# Patient Record
Sex: Male | Born: 1980 | Race: White | Hispanic: No | Marital: Single | Smoking: Former smoker
Health system: Southern US, Community
[De-identification: ages and names within clinical notes are randomized; demographics above are authoritative.]

## PROBLEM LIST (undated history)

## (undated) DIAGNOSIS — K922 Gastrointestinal hemorrhage, unspecified: Secondary | ICD-10-CM

## (undated) DIAGNOSIS — F419 Anxiety disorder, unspecified: Secondary | ICD-10-CM

## (undated) DIAGNOSIS — R519 Headache, unspecified: Secondary | ICD-10-CM

## (undated) DIAGNOSIS — F329 Major depressive disorder, single episode, unspecified: Secondary | ICD-10-CM

## (undated) DIAGNOSIS — K279 Peptic ulcer, site unspecified, unspecified as acute or chronic, without hemorrhage or perforation: Secondary | ICD-10-CM

## (undated) DIAGNOSIS — K219 Gastro-esophageal reflux disease without esophagitis: Secondary | ICD-10-CM

## (undated) DIAGNOSIS — Z87442 Personal history of urinary calculi: Secondary | ICD-10-CM

## (undated) DIAGNOSIS — R569 Unspecified convulsions: Secondary | ICD-10-CM

## (undated) DIAGNOSIS — T4145XA Adverse effect of unspecified anesthetic, initial encounter: Secondary | ICD-10-CM

## (undated) DIAGNOSIS — G8929 Other chronic pain: Secondary | ICD-10-CM

## (undated) DIAGNOSIS — M792 Neuralgia and neuritis, unspecified: Secondary | ICD-10-CM

## (undated) DIAGNOSIS — R51 Headache: Secondary | ICD-10-CM

## (undated) DIAGNOSIS — R1013 Epigastric pain: Secondary | ICD-10-CM

## (undated) DIAGNOSIS — I10 Essential (primary) hypertension: Secondary | ICD-10-CM

## (undated) DIAGNOSIS — F32A Depression, unspecified: Secondary | ICD-10-CM

## (undated) DIAGNOSIS — T8859XA Other complications of anesthesia, initial encounter: Secondary | ICD-10-CM

## (undated) DIAGNOSIS — M549 Dorsalgia, unspecified: Secondary | ICD-10-CM

## (undated) DIAGNOSIS — F4310 Post-traumatic stress disorder, unspecified: Secondary | ICD-10-CM

## (undated) DIAGNOSIS — M1990 Unspecified osteoarthritis, unspecified site: Secondary | ICD-10-CM

## (undated) HISTORY — PX: SHOULDER SURGERY: SHX246

## (undated) HISTORY — DX: Unspecified convulsions: R56.9

## (undated) HISTORY — DX: Anxiety disorder, unspecified: F41.9

## (undated) HISTORY — PX: NOSE SURGERY: SHX723

## (undated) HISTORY — PX: RHINOPLASTY: SUR1284

---

## 2001-12-15 HISTORY — PX: RHINOPLASTY: SUR1284

## 2007-09-13 ENCOUNTER — Emergency Department (HOSPITAL_COMMUNITY): Admission: EM | Admit: 2007-09-13 | Discharge: 2007-09-13 | Payer: Self-pay | Admitting: Emergency Medicine

## 2008-01-04 ENCOUNTER — Emergency Department (HOSPITAL_COMMUNITY): Admission: EM | Admit: 2008-01-04 | Discharge: 2008-01-04 | Payer: Self-pay | Admitting: Emergency Medicine

## 2008-02-24 ENCOUNTER — Emergency Department (HOSPITAL_COMMUNITY): Admission: EM | Admit: 2008-02-24 | Discharge: 2008-02-24 | Payer: Self-pay | Admitting: Emergency Medicine

## 2008-02-28 ENCOUNTER — Emergency Department (HOSPITAL_COMMUNITY): Admission: EM | Admit: 2008-02-28 | Discharge: 2008-02-28 | Payer: Self-pay | Admitting: Emergency Medicine

## 2008-06-14 ENCOUNTER — Emergency Department (HOSPITAL_COMMUNITY): Admission: EM | Admit: 2008-06-14 | Discharge: 2008-06-14 | Payer: Self-pay | Admitting: Emergency Medicine

## 2008-07-12 ENCOUNTER — Emergency Department (HOSPITAL_COMMUNITY): Admission: EM | Admit: 2008-07-12 | Discharge: 2008-07-12 | Payer: Self-pay | Admitting: Emergency Medicine

## 2008-09-02 ENCOUNTER — Emergency Department (HOSPITAL_COMMUNITY): Admission: EM | Admit: 2008-09-02 | Discharge: 2008-09-02 | Payer: Self-pay | Admitting: Emergency Medicine

## 2008-10-10 ENCOUNTER — Emergency Department (HOSPITAL_COMMUNITY): Admission: EM | Admit: 2008-10-10 | Discharge: 2008-10-10 | Payer: Self-pay | Admitting: Emergency Medicine

## 2008-12-13 ENCOUNTER — Emergency Department (HOSPITAL_COMMUNITY): Admission: EM | Admit: 2008-12-13 | Discharge: 2008-12-13 | Payer: Self-pay | Admitting: Emergency Medicine

## 2009-03-14 ENCOUNTER — Emergency Department (HOSPITAL_COMMUNITY): Admission: EM | Admit: 2009-03-14 | Discharge: 2009-03-14 | Payer: Self-pay | Admitting: Emergency Medicine

## 2009-03-23 ENCOUNTER — Emergency Department (HOSPITAL_COMMUNITY): Admission: EM | Admit: 2009-03-23 | Discharge: 2009-03-23 | Payer: Self-pay | Admitting: Emergency Medicine

## 2009-05-06 ENCOUNTER — Emergency Department (HOSPITAL_COMMUNITY): Admission: EM | Admit: 2009-05-06 | Discharge: 2009-05-06 | Payer: Self-pay | Admitting: Emergency Medicine

## 2009-06-10 ENCOUNTER — Emergency Department (HOSPITAL_COMMUNITY): Admission: EM | Admit: 2009-06-10 | Discharge: 2009-06-10 | Payer: Self-pay | Admitting: Emergency Medicine

## 2009-07-02 ENCOUNTER — Emergency Department (HOSPITAL_COMMUNITY): Admission: EM | Admit: 2009-07-02 | Discharge: 2009-07-02 | Payer: Self-pay | Admitting: Emergency Medicine

## 2009-07-11 ENCOUNTER — Emergency Department (HOSPITAL_COMMUNITY): Admission: EM | Admit: 2009-07-11 | Discharge: 2009-07-11 | Payer: Self-pay | Admitting: Emergency Medicine

## 2009-09-03 ENCOUNTER — Emergency Department (HOSPITAL_COMMUNITY): Admission: EM | Admit: 2009-09-03 | Discharge: 2009-09-03 | Payer: Self-pay | Admitting: Emergency Medicine

## 2009-11-14 ENCOUNTER — Emergency Department (HOSPITAL_COMMUNITY): Admission: EM | Admit: 2009-11-14 | Discharge: 2009-11-14 | Payer: Self-pay | Admitting: Emergency Medicine

## 2010-07-03 ENCOUNTER — Emergency Department (HOSPITAL_COMMUNITY): Admission: EM | Admit: 2010-07-03 | Discharge: 2010-07-03 | Payer: Self-pay | Admitting: Emergency Medicine

## 2010-12-16 ENCOUNTER — Emergency Department (HOSPITAL_COMMUNITY)
Admission: EM | Admit: 2010-12-16 | Discharge: 2010-12-16 | Payer: Self-pay | Source: Home / Self Care | Admitting: Emergency Medicine

## 2011-02-03 ENCOUNTER — Emergency Department (HOSPITAL_COMMUNITY)
Admission: EM | Admit: 2011-02-03 | Discharge: 2011-02-03 | Disposition: A | Discharge status: Home / Self Care | Payer: Self-pay | Attending: Emergency Medicine | Admitting: Emergency Medicine

## 2011-02-03 DIAGNOSIS — M25519 Pain in unspecified shoulder: Secondary | ICD-10-CM | POA: Insufficient documentation

## 2011-02-03 DIAGNOSIS — S42309A Unspecified fracture of shaft of humerus, unspecified arm, initial encounter for closed fracture: Secondary | ICD-10-CM | POA: Insufficient documentation

## 2011-02-03 DIAGNOSIS — X58XXXA Exposure to other specified factors, initial encounter: Secondary | ICD-10-CM | POA: Insufficient documentation

## 2011-02-24 LAB — DIFFERENTIAL
Eosinophils Absolute: 0 10*3/uL (ref 0.0–0.7)
Eosinophils Relative: 0 % (ref 0–5)
Lymphs Abs: 1.6 10*3/uL (ref 0.7–4.0)
Monocytes Relative: 5 % (ref 3–12)
Neutro Abs: 9.3 10*3/uL — ABNORMAL HIGH (ref 1.7–7.7)
Neutrophils Relative %: 81 % — ABNORMAL HIGH (ref 43–77)

## 2011-02-24 LAB — CBC
HCT: 45 % (ref 39.0–52.0)
MCHC: 36.2 g/dL — ABNORMAL HIGH (ref 30.0–36.0)
MCV: 93.4 fL (ref 78.0–100.0)
RBC: 4.82 MIL/uL (ref 4.22–5.81)
WBC: 11.5 10*3/uL — ABNORMAL HIGH (ref 4.0–10.5)

## 2011-02-24 LAB — BASIC METABOLIC PANEL
BUN: 6 mg/dL (ref 6–23)
CO2: 27 mEq/L (ref 19–32)
Calcium: 9.8 mg/dL (ref 8.4–10.5)
Chloride: 100 mEq/L (ref 96–112)
Glucose, Bld: 95 mg/dL (ref 70–99)
Potassium: 3.9 mEq/L (ref 3.5–5.1)

## 2011-02-25 ENCOUNTER — Emergency Department (HOSPITAL_COMMUNITY): Payer: Self-pay

## 2011-02-25 ENCOUNTER — Encounter: Payer: Self-pay | Admitting: Orthopedic Surgery

## 2011-02-25 ENCOUNTER — Emergency Department (HOSPITAL_COMMUNITY)
Admission: EM | Admit: 2011-02-25 | Discharge: 2011-02-25 | Disposition: A | Discharge status: Home / Self Care | Payer: Self-pay | Attending: Emergency Medicine | Admitting: Emergency Medicine

## 2011-02-25 DIAGNOSIS — W1809XA Striking against other object with subsequent fall, initial encounter: Secondary | ICD-10-CM | POA: Insufficient documentation

## 2011-02-25 DIAGNOSIS — R569 Unspecified convulsions: Secondary | ICD-10-CM | POA: Insufficient documentation

## 2011-02-25 DIAGNOSIS — S42293A Other displaced fracture of upper end of unspecified humerus, initial encounter for closed fracture: Secondary | ICD-10-CM | POA: Insufficient documentation

## 2011-02-25 LAB — BASIC METABOLIC PANEL
BUN: 9 mg/dL (ref 6–23)
CO2: 22 mEq/L (ref 19–32)
Chloride: 100 mEq/L (ref 96–112)
Glucose, Bld: 85 mg/dL (ref 70–99)
Potassium: 4 mEq/L (ref 3.5–5.1)
Sodium: 134 mEq/L — ABNORMAL LOW (ref 135–145)

## 2011-02-27 ENCOUNTER — Ambulatory Visit (INDEPENDENT_AMBULATORY_CARE_PROVIDER_SITE_OTHER): Payer: Self-pay | Admitting: Orthopedic Surgery

## 2011-02-27 ENCOUNTER — Encounter: Payer: Self-pay | Admitting: Orthopedic Surgery

## 2011-02-27 DIAGNOSIS — S42209A Unspecified fracture of upper end of unspecified humerus, initial encounter for closed fracture: Secondary | ICD-10-CM

## 2011-03-04 NOTE — Assessment & Plan Note (Signed)
Summary: AP ER FOL/UP/FX LT SHOULDER/XRAY AP 02/25/11/SELF-PAY/AWARE OF...   Visit Type:  new patient Referring Provider:  ap er Primary Provider:  na  CC:  left shoulder pain.  History of Present Illness: I saw Alan Jennings in the office today for an initial visit.  He is a 30 years old man with the complaint of:  left shoulder pain.  Fell 02/25/11 while having seizure.  Xrays of the left shoulder and right knee on 02/25/11.  Meds: Dilantin, Diazepam, was given Percocet 5/325mg  from er, number 70.  30 year old male with seizure disorder ran out of his medication had another seizure. He has a previous LEFT humerus fracture treated in the evening, [place] with a sling and swath. When he fell this time. He injured his proximal humerus. X-rays show a proximal humerus fracture with no displacement fragment. Greater than a centimeter and angulated greater than 45. He has sharp, dull, throbbing, stabbing, burning pain.  Intensity is 8/10. Constant pain. He does have some tingling. He has some swelling.     Allergies (verified): 1)  ! * Tramadol  Past History:  Past Medical History: anxiety seizures  Past Surgical History: na  Family History: Family History Coronary Heart Disease male < 49 Family History of Arthritis  Social History: Patient is single.  unemployed smokes less than half pack of cigs per day no smoking no caffeine 2 yrs college [employer]  Review of Systems Constitutional:  Denies weight loss, weight gain, fever, chills, and fatigue. Cardiovascular:  Denies chest pain, palpitations, fainting, and murmurs. Respiratory:  Denies short of breath, wheezing, couch, tightness, pain on inspiration, and snoring . Gastrointestinal:  Denies heartburn, nausea, vomiting, diarrhea, constipation, and blood in your stools. Genitourinary:  Denies frequency, urgency, difficulty urinating, painful urination, flank pain, and bleeding in urine. Neurologic:  Complains of  seizure; denies numbness, tingling, unsteady gait, dizziness, and tremors. Musculoskeletal:  Complains of joint pain, swelling, stiffness, heat, and muscle pain; denies instability and redness. Endocrine:  Denies excessive thirst, exessive urination, and heat or cold intolerance. Psychiatric:  Complains of nervousness and anxiety; denies depression and hallucinations. Skin:  Denies changes in the skin, poor healing, rash, itching, and redness. HEENT:  Denies blurred or double vision, eye pain, redness, and watering. Immunology:  Denies seasonal allergies, sinus problems, and allergic to bee stings. Hemoatologic:  Denies easy bleeding and brusing.  Physical Exam  Msk:  The patient is well developed and nourished, with normal grooming and hygiene. The body habitus is  normal Pulses:  radial ulnar normal 2 +        Extremities:  in  He has a tenderness in the proximal humerus. He has an old, is around his chest, and proximal humerus, which he says is from the previous fracture has some tenderness over his acromioclavicular joint.  He has minimal to no movement of the shoulder joint itself.  Muscle tone is normal. There is some swelling in the arm. The elbow seems to be normal, although we have limited exam secondary to pain. Neurologic:  NORMAL SENSATION IN THE LEFT UPPER EXTREMITY   HE DOSES HAVE WEAKNESS OF HIS WR EXT  Skin:  intact without lesions or rashes Cervical Nodes:  no significant adenopathy Psych:  alert and cooperative; normal mood and affect; normal attention span and concentration   Impression & Recommendations: the hospital, found shows a proximal humerus fracture with no fragment displaced greater than 45 or 1 cm.  This meets the criteria for nonoperative treatment.  Continue  sling come back in 2 weeks for x-rays.  Take ibuprofen and Norco 7.5 mg for pain  Medications Added to Medication List This Visit: 1)  Norco 7.5-325 Mg Tabs  (Hydrocodone-acetaminophen) .Marland Kitchen.. 1 q 4 as needed pain 2)  Ibuprofen 800 Mg Tabs (Ibuprofen) .Marland Kitchen.. 1 by mouth three times a day  Other Orders: New Patient Level III (60454) Humeral Neck Fx (09811)  Patient Instructions: 1)  Wear sling x 2 weeks then come back for shoulder x-rays  Prescriptions: IBUPROFEN 800 MG TABS (IBUPROFEN) 1 by mouth three times a day  #90 x 1   Entered and Authorized by:   Fuller Canada MD   Signed by:   Fuller Canada MD on 02/27/2011   Method used:   Print then Give to Patient   RxID:   [id number] NORCO 7.5-325 MG TABS (HYDROCODONE-ACETAMINOPHEN) 1 q 4 as needed pain  #84 x 0   Entered and Authorized by:   Fuller Canada MD   Signed by:   Fuller Canada MD on 02/27/2011   Method used:   Print then Give to Patient   RxID:   [id number]    Orders Added: 1)  New Patient Level III [40102] 2)  Humeral Neck Fx [23600]  Appended Document: AP ER FOL/UP/FX LT SHOULDER/XRAY AP 02/25/11/SELF-PAY/AWARE OF... The x-rays were done at [facility]. The report and the films have been reviewed.

## 2011-03-13 NOTE — Letter (Signed)
Summary: History form  History form   Imported By: Cammie Sickle 03/03/2011 21:06:06  _____________________________________________________________________  External Attachment:    Type:   Image     Comment:   External Document

## 2011-03-18 ENCOUNTER — Ambulatory Visit (INDEPENDENT_AMBULATORY_CARE_PROVIDER_SITE_OTHER): Payer: Self-pay | Admitting: Orthopedic Surgery

## 2011-03-18 DIAGNOSIS — S42202A Unspecified fracture of upper end of left humerus, initial encounter for closed fracture: Secondary | ICD-10-CM

## 2011-03-18 MED ORDER — HYDROCODONE-ACETAMINOPHEN 7.5-325 MG PO TABS: 1.0000 | ORAL_TABLET | ORAL | Status: DC | PRN

## 2011-03-18 NOTE — Progress Notes (Signed)
LEFT proximal humerus fracture 3 weeks, since I've seen him  Injury date March 13  Treatment sling.  Initial films at [facility]  Followup film today shows a minimally angulated on the lateral nondisplaced on the AP, oblique, proximal humerus fracture, which is actually more in the shaft, running from proximal lateral to distal medial.  I think we can start some pendulums and Codman exercises, and followup for x-rays in 6 weeks.

## 2011-03-18 NOTE — Patient Instructions (Addendum)
You can remove the sling when you are in the house   Wear it when you are out of the house   Start pendulums at home as they are on the sheet

## 2011-03-18 NOTE — Progress Notes (Signed)
A separate x-ray report.  AP, lateral, LEFT shoulder proximal humerus fracture, running obliquely from proximal lateral to distal medial, nondisplaced. On the lateral there is slight angulation is less than 20.  Impression healing fracture LEFT proximal humerus

## 2011-04-30 ENCOUNTER — Ambulatory Visit (INDEPENDENT_AMBULATORY_CARE_PROVIDER_SITE_OTHER): Payer: Self-pay | Admitting: Orthopedic Surgery

## 2011-04-30 DIAGNOSIS — S42209A Unspecified fracture of upper end of unspecified humerus, initial encounter for closed fracture: Secondary | ICD-10-CM

## 2011-04-30 DIAGNOSIS — S42202A Unspecified fracture of upper end of left humerus, initial encounter for closed fracture: Secondary | ICD-10-CM

## 2011-04-30 MED ORDER — HYDROCODONE-ACETAMINOPHEN 7.5-325 MG PO TABS: 1.0000 | ORAL_TABLET | ORAL | Status: DC | PRN

## 2011-04-30 NOTE — Progress Notes (Signed)
Fracture care followup  Date of injury February 25, 2011  Proximal humerus fracture with minimal angulation no displacement on the AP view  The patient has started some pendulum and Codman exercises but complains of pain in the subacromial space and decreased range of motion  Radiographs taken today show fracture healing nondisplaced on the AP again with slight angulation on the lateral view less than 10.  Maybe less than even 5.  Exam shows tenderness around the proximal acromion posteriorly laterally and anteriorly and with increased pain in the bicipital groove.  Recommend subacromial injection  Patient to remove sling  Continue hydrocodone for pain  Followup 6 weeks for examination

## 2011-05-07 ENCOUNTER — Emergency Department (HOSPITAL_COMMUNITY): Payer: Self-pay

## 2011-05-07 ENCOUNTER — Emergency Department (HOSPITAL_COMMUNITY)
Admission: EM | Admit: 2011-05-07 | Discharge: 2011-05-07 | Disposition: A | Discharge status: Home / Self Care | Payer: Self-pay | Attending: Emergency Medicine | Admitting: Emergency Medicine

## 2011-05-07 DIAGNOSIS — M25519 Pain in unspecified shoulder: Secondary | ICD-10-CM | POA: Insufficient documentation

## 2011-05-07 DIAGNOSIS — Z79899 Other long term (current) drug therapy: Secondary | ICD-10-CM | POA: Insufficient documentation

## 2011-06-10 ENCOUNTER — Encounter (HOSPITAL_COMMUNITY): Payer: Self-pay | Admitting: Radiology

## 2011-06-10 ENCOUNTER — Emergency Department (HOSPITAL_COMMUNITY)
Admission: EM | Admit: 2011-06-10 | Discharge: 2011-06-10 | Disposition: A | Discharge status: Home / Self Care | Payer: Self-pay | Attending: Emergency Medicine | Admitting: Emergency Medicine

## 2011-06-10 ENCOUNTER — Emergency Department (HOSPITAL_COMMUNITY): Payer: Self-pay

## 2011-06-10 DIAGNOSIS — R569 Unspecified convulsions: Secondary | ICD-10-CM | POA: Insufficient documentation

## 2011-06-10 DIAGNOSIS — Z79899 Other long term (current) drug therapy: Secondary | ICD-10-CM | POA: Insufficient documentation

## 2011-06-10 LAB — BASIC METABOLIC PANEL
BUN: 7 mg/dL (ref 6–23)
Creatinine, Ser: 0.83 mg/dL (ref 0.50–1.35)
GFR calc non Af Amer: >60 mL/min (ref >60)
Glucose, Bld: 100 mg/dL — ABNORMAL HIGH (ref 70–99)
Potassium: 3.6 mEq/L (ref 3.5–5.1)

## 2011-06-10 LAB — CBC
HCT: 47.1 % (ref 39.0–52.0)
Hemoglobin: 16.8 g/dL (ref 13.0–17.0)
MCH: 33.1 pg (ref 26.0–34.0)
MCHC: 35.7 g/dL (ref 30.0–36.0)
MCV: 92.7 fL (ref 78.0–100.0)

## 2011-06-10 LAB — DIFFERENTIAL
Basophils Absolute: 0 10*3/uL (ref 0.0–0.1)
Lymphocytes Relative: 10 % — ABNORMAL LOW (ref 12–46)
Lymphs Abs: 1.1 10*3/uL (ref 0.7–4.0)
Monocytes Absolute: 0.5 10*3/uL (ref 0.1–1.0)
Monocytes Relative: 5 % (ref 3–12)
Neutro Abs: 9.9 10*3/uL — ABNORMAL HIGH (ref 1.7–7.7)

## 2011-06-11 ENCOUNTER — Ambulatory Visit (INDEPENDENT_AMBULATORY_CARE_PROVIDER_SITE_OTHER): Payer: Self-pay | Admitting: Orthopedic Surgery

## 2011-06-11 ENCOUNTER — Encounter: Payer: Self-pay | Admitting: Orthopedic Surgery

## 2011-06-11 DIAGNOSIS — S42309A Unspecified fracture of shaft of humerus, unspecified arm, initial encounter for closed fracture: Secondary | ICD-10-CM

## 2011-06-11 DIAGNOSIS — S42209A Unspecified fracture of upper end of unspecified humerus, initial encounter for closed fracture: Secondary | ICD-10-CM

## 2011-06-11 DIAGNOSIS — S42202A Unspecified fracture of upper end of left humerus, initial encounter for closed fracture: Secondary | ICD-10-CM

## 2011-06-11 MED ORDER — HYDROCODONE-ACETAMINOPHEN 7.5-325 MG PO TABS: 1.0000 | ORAL_TABLET | ORAL | Status: DC | PRN

## 2011-06-11 NOTE — Patient Instructions (Signed)
Continue exercises program

## 2011-06-11 NOTE — Progress Notes (Signed)
Fracture care followup  Date of injury February 25, 2011  Proximal humerus fracture with minimal angulation no displacement on the AP view   He is doing pendulum exercises notes improved range of motion and improved levels of pain.  He did have a seizure last night Mrs. Medication  Passive motion 90 abduction 55 external rotation with crepitus in the subacromial space  There is prominence in the anterior portion of the deltoid secondary to angulation at the fracture site  Recommended range of motion exercises continue hydrocodone 7.5 followup in 6 weeks.

## 2011-07-23 ENCOUNTER — Ambulatory Visit (INDEPENDENT_AMBULATORY_CARE_PROVIDER_SITE_OTHER): Payer: Self-pay | Admitting: Orthopedic Surgery

## 2011-07-23 DIAGNOSIS — S42209A Unspecified fracture of upper end of unspecified humerus, initial encounter for closed fracture: Secondary | ICD-10-CM

## 2011-07-23 DIAGNOSIS — S42202A Unspecified fracture of upper end of left humerus, initial encounter for closed fracture: Secondary | ICD-10-CM

## 2011-07-23 MED ORDER — HYDROCODONE-ACETAMINOPHEN 7.5-325 MG PO TABS: 1.0000 | ORAL_TABLET | ORAL | Status: DC | PRN

## 2011-07-23 NOTE — Progress Notes (Signed)
Followup visit  LEFT proximal humerus fracture with mild deformity  Patient still having limited range of motion in the LEFT shoulder and pain  He is on hydrocodone 7.5 mg he is on a self directed home exercise program but only has 45 of abduction 60 of forward elevation, he does have 60 of external rotation with his arm at his side  At this point without some aggressive physical therapy I do not think he will improve.  He will try to apply for the disc out.  If we can get that done I would recommend a CAT scan of the shoulder to check alignment and perhaps he may need an osteotomy/internal fixation procedure.

## 2011-07-28 ENCOUNTER — Emergency Department (HOSPITAL_COMMUNITY)
Admission: EM | Admit: 2011-07-28 | Discharge: 2011-07-28 | Disposition: A | Discharge status: Home / Self Care | Payer: Self-pay | Attending: Emergency Medicine | Admitting: Emergency Medicine

## 2011-07-28 ENCOUNTER — Encounter (HOSPITAL_COMMUNITY): Payer: Self-pay | Admitting: *Deleted

## 2011-07-28 ENCOUNTER — Emergency Department (HOSPITAL_COMMUNITY): Payer: Self-pay

## 2011-07-28 DIAGNOSIS — S01501A Unspecified open wound of lip, initial encounter: Secondary | ICD-10-CM | POA: Insufficient documentation

## 2011-07-28 DIAGNOSIS — F17200 Nicotine dependence, unspecified, uncomplicated: Secondary | ICD-10-CM | POA: Insufficient documentation

## 2011-07-28 DIAGNOSIS — S01511A Laceration without foreign body of lip, initial encounter: Secondary | ICD-10-CM

## 2011-07-28 DIAGNOSIS — S0083XA Contusion of other part of head, initial encounter: Secondary | ICD-10-CM

## 2011-07-28 DIAGNOSIS — Y92009 Unspecified place in unspecified non-institutional (private) residence as the place of occurrence of the external cause: Secondary | ICD-10-CM | POA: Insufficient documentation

## 2011-07-28 DIAGNOSIS — S1093XA Contusion of unspecified part of neck, initial encounter: Secondary | ICD-10-CM | POA: Insufficient documentation

## 2011-07-28 DIAGNOSIS — S0003XA Contusion of scalp, initial encounter: Secondary | ICD-10-CM | POA: Insufficient documentation

## 2011-07-28 DIAGNOSIS — F341 Dysthymic disorder: Secondary | ICD-10-CM | POA: Insufficient documentation

## 2011-07-28 HISTORY — DX: Major depressive disorder, single episode, unspecified: F32.9

## 2011-07-28 HISTORY — DX: Depression, unspecified: F32.A

## 2011-07-28 MED ORDER — HYDROCODONE-ACETAMINOPHEN 5-325 MG PO TABS
2.0000 | ORAL_TABLET | Freq: Once | ORAL | Status: AC
  Administered 2011-07-28: 2 via ORAL
  Filled 2011-07-28: qty 2

## 2011-07-28 MED ORDER — HYDROCODONE-ACETAMINOPHEN 5-325 MG PO TABS: 1.0000 | ORAL_TABLET | ORAL | Status: AC | PRN
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
## 2011-07-28 NOTE — ED Notes (Signed)
Pt self ambulated out with a steady gait stating no needs 

## 2011-07-28 NOTE — ED Notes (Signed)
Pt stating pain relief rating it 4/10

## 2011-07-28 NOTE — ED Notes (Signed)
MD at bedside. 
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
## 2011-08-27 ENCOUNTER — Encounter: Payer: Self-pay | Admitting: Orthopedic Surgery

## 2011-08-27 ENCOUNTER — Ambulatory Visit (INDEPENDENT_AMBULATORY_CARE_PROVIDER_SITE_OTHER): Payer: Self-pay | Admitting: Orthopedic Surgery

## 2011-08-27 VITALS — Ht 69.0 in | Wt 164.0 lb

## 2011-08-27 DIAGNOSIS — S42209A Unspecified fracture of upper end of unspecified humerus, initial encounter for closed fracture: Secondary | ICD-10-CM

## 2011-08-27 DIAGNOSIS — S42202A Unspecified fracture of upper end of left humerus, initial encounter for closed fracture: Secondary | ICD-10-CM

## 2011-08-27 MED ORDER — HYDROCODONE-ACETAMINOPHEN 7.5-325 MG PO TABS: 1.0000 | ORAL_TABLET | ORAL | Status: DC | PRN

## 2011-08-27 NOTE — Patient Instructions (Signed)
CALL ME WHEN YOU'VE SEEN THE DOCTOR REGARDING YOUR SEIZURES

## 2011-08-27 NOTE — Progress Notes (Signed)
LEFT proximal humerus fracture  Injury date March 13  Treatment sling.  Initial films at [facility]  Patient transferred care to me from Dr. Weyman Pedro arteritis  He recently had a seizure on the 16th of last month and then also fell again and injured his LEFT shoulder about 3 weeks ago.  The swelling is making the shoulder hurt worse.  Repeat films were done today  X-rays today show no real change in the position of the fracture.  He is having a lot of subacromial crepitance in his shoulder abduction is about 90 with pain.  The x-rays show slight tilting of the fracture fragment which I believe is giving him a subacromial impingement  The only answer to this is to redrape the bone realign the humerus plate it and start over.  However, his seizures are Not under control despite her recent change of medication  Recommend followup with a neurologist and get his seizures are under control and let us know.  This is of course a reconstruction procedure and would need probably shoulder specialist which complicates the matter even more because he is on Not insured  Separate x-ray report AP lateral LEFT shoulder  Compared to previous films there is a fracture LEFT proximal humerus primarily to fragment.  On the AP view the fracture appears to be aligned.  On the lateral view there is slight angulation the fracture which is causing greater tuberosity impingement  Impression no change in the previous position of the fracture no new fractures seen slight angulation seen on the lateral view.

## 2011-09-15 ENCOUNTER — Emergency Department (HOSPITAL_COMMUNITY): Payer: Medicaid Other

## 2011-09-15 ENCOUNTER — Encounter (HOSPITAL_COMMUNITY): Payer: Self-pay | Admitting: Emergency Medicine

## 2011-09-15 ENCOUNTER — Emergency Department (HOSPITAL_COMMUNITY)
Admission: EM | Admit: 2011-09-15 | Discharge: 2011-09-15 | Disposition: A | Discharge status: Home / Self Care | Payer: Medicaid Other | Attending: Emergency Medicine | Admitting: Emergency Medicine

## 2011-09-15 DIAGNOSIS — M25569 Pain in unspecified knee: Secondary | ICD-10-CM | POA: Insufficient documentation

## 2011-09-15 DIAGNOSIS — S8000XA Contusion of unspecified knee, initial encounter: Secondary | ICD-10-CM | POA: Insufficient documentation

## 2011-09-15 DIAGNOSIS — W108XXA Fall (on) (from) other stairs and steps, initial encounter: Secondary | ICD-10-CM | POA: Insufficient documentation

## 2011-09-15 DIAGNOSIS — S8002XA Contusion of left knee, initial encounter: Secondary | ICD-10-CM

## 2011-09-15 DIAGNOSIS — F17200 Nicotine dependence, unspecified, uncomplicated: Secondary | ICD-10-CM | POA: Insufficient documentation

## 2011-09-15 DIAGNOSIS — Z79899 Other long term (current) drug therapy: Secondary | ICD-10-CM | POA: Insufficient documentation

## 2011-09-15 MED ORDER — HYDROCODONE-ACETAMINOPHEN 5-325 MG PO TABS: 1.0000 | ORAL_TABLET | ORAL | Status: AC | PRN

## 2011-09-15 NOTE — ED Provider Notes (Signed)
History     CSN: [id number] Arrival date & time: 09/15/2011  3:19 PM  Chief Complaint  Patient presents with  . Fall  . Knee Pain    (Consider location/radiation/quality/duration/timing/severity/associated sxs/prior treatment) HPI Comments: The patient has a 30 year old man. He says he was walking down concrete steps yesterday. His left knee" gave out" and he fell, striking his left knee the patella. He also hurt both hands, but they don't bother him very much today. Over the night he has had increased swelling and pain in his left knee. He therefore seeks evaluation. There've been no previous injury to his left knee.  Patient is a 30 y.o. male presenting with fall and knee pain. The history is provided by the patient. No language interpreter was used.  Fall The accident occurred yesterday. Incident: He fell while going down concrete steps. He fell from a height of 1 to 2 ft. He landed on concrete. There was no blood loss. The point of impact was the left knee. The pain is present in the left knee. The pain is at a severity of 7/10. The pain is moderate. He was ambulatory at the scene. There was no entrapment after the fall. There was no drug use involved in the accident. There was no alcohol use involved in the accident. The symptoms are aggravated by flexion and ambulation. He has tried nothing for the symptoms.  Knee Pain    Past Medical History  Diagnosis Date  . Anxiety   . Seizures   . Depression     History reviewed. No pertinent past surgical history.  Family History  Problem Relation Age of Onset  . Heart disease    . Arthritis      History  Substance Use Topics  . Smoking status: Current Everyday Smoker -- 0.5 packs/day for 7 years    Types: Cigarettes  . Smokeless tobacco: Never Used  . Alcohol Use: Yes     occasional beer      Review of Systems  All other systems reviewed and are negative.    Allergies  Tramadol  Home Medications   Current  Outpatient Rx  Name Route Sig Dispense Refill  . ASPIRIN-ACETAMINOPHEN 500-325 MG PO PACK Oral Take 1 packet by mouth daily as needed. For shoulder pain     . IBUPROFEN 200 MG PO TABS Oral Take 400 mg by mouth 2 (two) times daily as needed. For shoulder pain     . PHENYTOIN SODIUM EXTENDED 100 MG PO CAPS Oral Take 200 mg by mouth at bedtime.     Marland Kitchen CITALOPRAM HYDROBROMIDE 40 MG PO TABS Oral Take 40 mg by mouth daily.      Marland Kitchen DIAZEPAM 5 MG PO TABS Oral Take 5 mg by mouth 2 (two) times daily.      Marland Kitchen HYDROCODONE-ACETAMINOPHEN 7.5-325 MG PO TABS Oral Take 1 tablet by mouth every 4 (four) hours as needed. 84 tablet 1  . IBUPROFEN 800 MG PO TABS Oral Take 800 mg by mouth every 8 (eight) hours as needed. For pain    . TRAZODONE HCL 100 MG PO TABS Oral Take 100 mg by mouth at bedtime.        BP 169/108  Pulse 107  Temp(Src) 99.2 F (37.3 C) (Oral)  Resp 20  Ht 5\' 8"  (1.727 m)  Wt 165 lb (74.844 kg)  BMI 25.09 kg/m2  SpO2 100%  Physical Exam  Nursing note and vitals reviewed. Constitutional: He is oriented to person, place, and time.  He appears well-developed and well-nourished. He appears distressed.  HENT:  Head: Normocephalic and atraumatic.  Right Ear: External ear normal.  Left Ear: External ear normal.  Mouth/Throat: Oropharynx is clear and moist.  Eyes: Conjunctivae and EOM are normal.  Neck: Normal range of motion. Neck supple.  Musculoskeletal:       He has ecchymosis overlying the left patella. He lacks about 15 of full extension. There is a small effusion. There is no ligamentous instability or bony deformity. Skin is intact. He also has ecchymosis on the dorsum of both hands overlying the third fourth and fifth MCP joints. There is no bony deformity of his hands. He has intact pulses sensation and tendon function in his arms and legs.  Neurological: He is alert and oriented to person, place, and time.       No sensory or motor deficit.  Skin: Skin is warm and dry.  Psychiatric:  He has a normal mood and affect. His behavior is normal.    ED Course  Procedures (including critical care time)  Labs Reviewed - No data to display Dg Knee Complete 4 Views Left  09/15/2011  *RADIOLOGY REPORT*  Clinical Data: Pain, redness and bruising left knee, fell down concrete steps  LEFT KNEE - COMPLETE 4+ VIEW  Comparison: 06/10/2009  Findings: Bone mineralization normal. Joint spaces preserved. No fracture, dislocation, or bone destruction. No joint effusion. Question mild anterior soft tissue swelling.  IMPRESSION: No acute osseous abnormalities.  Original Report Authenticated By: Lollie Marrow, M.D.   Dg Hand Complete Right  09/15/2011  *RADIOLOGY REPORT*  Clinical Data: Pain, redness and bruising right hand, fell down concrete steps  RIGHT HAND - COMPLETE 3+ VIEW  Comparison: 09/03/2009  Findings: Bone mineralization normal. Joint spaces preserved. No fracture, dislocation, or bone destruction.  IMPRESSION: No acute osseous abnormalities.  Original Report Authenticated By: Lollie Marrow, M.D.   4:44 PM Course in the ED: Patient was seen and had physical examination. X-rays of his left knee were negative. He was advised he needs to use knee immobilizer when up.  He can take hydrocodone-acetaminophen every 4 hours if needed for pain. He should call Dr. Fuller Canada in his office to make a followup appointment. He has seen Dr. Romeo Apple in the past. No work for 3 days.   No diagnosis found.    MDM          Carleene Cooper III, MD 09/15/11 [phone]

## 2011-09-15 NOTE — ED Notes (Signed)
Patient of left knee pain. Patient states "I was walking down some steps yesterday afternoon and my knee gave out from under me, like it wasn't there anymore. I hit my knee and hands." Bruises noted to left knee and hands. Scabbed areas/abrasions noted to left knee.

## 2011-09-15 NOTE — ED Notes (Signed)
Pt back from xray, sitting in room on bed; nad noted

## 2011-09-19 LAB — DIFFERENTIAL
Basophils Absolute: 0 10*3/uL (ref 0.0–0.1)
Basophils Relative: 0 % (ref 0–1)
Eosinophils Absolute: 0 10*3/uL (ref 0.0–0.7)
Eosinophils Relative: 0 % (ref 0–5)
Monocytes Absolute: 0.4 10*3/uL (ref 0.1–1.0)
Monocytes Relative: 4 % (ref 3–12)
Neutro Abs: 9 10*3/uL — ABNORMAL HIGH (ref 1.7–7.7)

## 2011-09-19 LAB — CBC
Hemoglobin: 16.3 g/dL (ref 13.0–17.0)
MCHC: 33.7 g/dL (ref 30.0–36.0)
MCV: 93 fL (ref 78.0–100.0)
RBC: 5.19 MIL/uL (ref 4.22–5.81)
RDW: 12.4 % (ref 11.5–15.5)

## 2011-09-19 LAB — URINALYSIS, ROUTINE W REFLEX MICROSCOPIC
Glucose, UA: NEGATIVE mg/dL
Ketones, ur: 15 mg/dL — AB
Nitrite: NEGATIVE
Specific Gravity, Urine: 1.01 (ref 1.005–1.030)
pH: 7.5 (ref 5.0–8.0)

## 2011-09-19 LAB — BASIC METABOLIC PANEL
CO2: 22 mEq/L (ref 19–32)
Calcium: 9.7 mg/dL (ref 8.4–10.5)
Chloride: 106 mEq/L (ref 96–112)
Glucose, Bld: 106 mg/dL — ABNORMAL HIGH (ref 70–99)
Sodium: 136 mEq/L (ref 135–145)

## 2011-09-19 LAB — RAPID URINE DRUG SCREEN, HOSP PERFORMED: Tetrahydrocannabinol: POSITIVE — AB

## 2011-09-23 ENCOUNTER — Emergency Department (HOSPITAL_COMMUNITY)
Admission: EM | Admit: 2011-09-23 | Discharge: 2011-09-23 | Disposition: A | Discharge status: Home / Self Care | Payer: Medicaid Other | Attending: Emergency Medicine | Admitting: Emergency Medicine

## 2011-09-23 ENCOUNTER — Emergency Department (HOSPITAL_COMMUNITY): Payer: Medicaid Other

## 2011-09-23 DIAGNOSIS — G40909 Epilepsy, unspecified, not intractable, without status epilepticus: Secondary | ICD-10-CM | POA: Insufficient documentation

## 2011-09-23 DIAGNOSIS — W1789XA Other fall from one level to another, initial encounter: Secondary | ICD-10-CM | POA: Insufficient documentation

## 2011-09-23 DIAGNOSIS — Z79899 Other long term (current) drug therapy: Secondary | ICD-10-CM | POA: Insufficient documentation

## 2011-09-23 DIAGNOSIS — IMO0002 Reserved for concepts with insufficient information to code with codable children: Secondary | ICD-10-CM | POA: Insufficient documentation

## 2011-09-23 DIAGNOSIS — M25519 Pain in unspecified shoulder: Secondary | ICD-10-CM | POA: Insufficient documentation

## 2011-09-23 DIAGNOSIS — Z8781 Personal history of (healed) traumatic fracture: Secondary | ICD-10-CM | POA: Insufficient documentation

## 2011-10-06 ENCOUNTER — Encounter (HOSPITAL_COMMUNITY): Payer: Self-pay | Admitting: *Deleted

## 2011-10-06 ENCOUNTER — Emergency Department (HOSPITAL_COMMUNITY)
Admission: EM | Admit: 2011-10-06 | Discharge: 2011-10-06 | Disposition: A | Discharge status: Home / Self Care | Payer: Medicaid Other | Attending: Emergency Medicine | Admitting: Emergency Medicine

## 2011-10-06 ENCOUNTER — Emergency Department (HOSPITAL_COMMUNITY): Payer: Medicaid Other

## 2011-10-06 DIAGNOSIS — Z7982 Long term (current) use of aspirin: Secondary | ICD-10-CM | POA: Insufficient documentation

## 2011-10-06 DIAGNOSIS — R569 Unspecified convulsions: Secondary | ICD-10-CM | POA: Insufficient documentation

## 2011-10-06 DIAGNOSIS — F329 Major depressive disorder, single episode, unspecified: Secondary | ICD-10-CM | POA: Insufficient documentation

## 2011-10-06 DIAGNOSIS — Z87891 Personal history of nicotine dependence: Secondary | ICD-10-CM | POA: Insufficient documentation

## 2011-10-06 DIAGNOSIS — F3289 Other specified depressive episodes: Secondary | ICD-10-CM | POA: Insufficient documentation

## 2011-10-06 DIAGNOSIS — F411 Generalized anxiety disorder: Secondary | ICD-10-CM | POA: Insufficient documentation

## 2011-10-06 DIAGNOSIS — S40029A Contusion of unspecified upper arm, initial encounter: Secondary | ICD-10-CM | POA: Insufficient documentation

## 2011-10-06 DIAGNOSIS — T148XXA Other injury of unspecified body region, initial encounter: Secondary | ICD-10-CM

## 2011-10-06 DIAGNOSIS — IMO0002 Reserved for concepts with insufficient information to code with codable children: Secondary | ICD-10-CM | POA: Insufficient documentation

## 2011-10-06 MED ORDER — OXYCODONE-ACETAMINOPHEN 5-325 MG PO TABS
1.0000 | ORAL_TABLET | Freq: Once | ORAL | Status: AC
  Administered 2011-10-06: 1 via ORAL
  Filled 2011-10-06: qty 1
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
## 2011-10-06 NOTE — ED Provider Notes (Signed)
History     CSN: [id number] Arrival date & time: 10/06/2011 12:52 PM   First MD Initiated Contact with Patient 10/06/11 1313    Chief Complaint  Patient presents with  . Arm Injury    (Consider location/radiation/quality/duration/timing/severity/associated sxs/prior treatment) Patient is a 30 y.o. male presenting with arm injury. The history is provided by the patient.  Arm Injury  The incident occurred today. There is an injury to the left upper arm. The pain is moderate. Pertinent negatives include no numbness and no focal weakness.  pt reports he was helping his family with some work and a large pipe swung down and hit his left UE.  No crush injury reported.  He reports pain/swelling to the arm  Past Medical History  Diagnosis Date  . Anxiety   . Seizures   . Depression   . Seizures     Past Surgical History  Procedure Date  . Nose surgery     Family History  Problem Relation Age of Onset  . Heart disease    . Arthritis      History  Substance Use Topics  . Smoking status: Former Smoker -- 0.5 packs/day for 7 years    Types: Cigarettes    Quit date: 09/19/2011  . Smokeless tobacco: Never Used  . Alcohol Use: Yes     occasional beer      Review of Systems  Neurological: Negative for focal weakness and numbness.    Allergies  Tramadol  Home Medications   Current Outpatient Rx  Name Route Sig Dispense Refill  . ASPIRIN-ACETAMINOPHEN 500-325 MG PO PACK Oral Take 1 packet by mouth daily as needed. For shoulder pain     . CITALOPRAM HYDROBROMIDE 40 MG PO TABS Oral Take 40 mg by mouth daily.      Marland Kitchen DIAZEPAM 5 MG PO TABS Oral Take 5 mg by mouth 2 (two) times daily.      Marland Kitchen HYDROCODONE-ACETAMINOPHEN 7.5-325 MG PO TABS Oral Take 1 tablet by mouth every 4 (four) hours as needed. 84 tablet 1  . IBUPROFEN 200 MG PO TABS Oral Take 400 mg by mouth 2 (two) times daily as needed. For shoulder pain     . IBUPROFEN 800 MG PO TABS Oral Take 800 mg by mouth every 8  (eight) hours as needed. For pain    . PHENYTOIN SODIUM EXTENDED 100 MG PO CAPS Oral Take 200 mg by mouth at bedtime.     . TRAZODONE HCL 100 MG PO TABS Oral Take 100 mg by mouth at bedtime.        BP 141/88  Pulse 84  Temp(Src) 98.6 F (37 C) (Oral)  Resp 19  Ht 5\' 8"  (1.727 m)  Wt 164 lb (74.39 kg)  BMI 24.94 kg/m2  SpO2 99%  Physical Exam  CONSTITUTIONAL: Well developed/well nourished HEAD AND FACE: Normocephalic/atraumatic EYES: EOMI/PERRL ENMT: Mucous membranes moist NECK: supple no meningeal signs CV: S1/S2 noted, no murmurs/rubs/gallops noted LUNGS: Lungs are clear to auscultation bilaterally, no apparent distress ABDOMEN: soft, nontender, no rebound or guarding NEURO: Pt is awake/alert, moves all extremitiesx4, distal motor/sensory intact on left UE EXTREMITIES: pulses normal, full ROM, large hematoma noted over tricep region on left UE, but no open skin, no bleeding.  The bicep is soft to palpation.  He can range left elbow.  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  Significant soft tissue swelling noted in mid humeral region.  IMPRESSION: Again noted nonunion of the left humeral fracture.  There is significant soft tissue swelling.  Original Report Authenticated By: Natasha Mead, M.D.     No diagnosis found.    MDM  Nursing notes reviewed and considered in documentation xrays reviewed and considered  2:17 PM D/w dr Romeo Apple, elevate arm, use sling, given  compartment syndrome instructions, he can see in 48 hours  His bicep is soft, no tenderness over bicep Stable for d/c and discussed strict return precautions   Joya Gaskins, MD 10/06/11 2243

## 2011-10-08 ENCOUNTER — Ambulatory Visit (INDEPENDENT_AMBULATORY_CARE_PROVIDER_SITE_OTHER): Payer: Self-pay | Admitting: Orthopedic Surgery

## 2011-10-08 ENCOUNTER — Encounter: Payer: Self-pay | Admitting: Orthopedic Surgery

## 2011-10-08 VITALS — BP 150/80 | Ht 68.0 in | Wt 172.4 lb

## 2011-10-08 DIAGNOSIS — S40022A Contusion of left upper arm, initial encounter: Secondary | ICD-10-CM | POA: Insufficient documentation

## 2011-10-08 DIAGNOSIS — S40029A Contusion of unspecified upper arm, initial encounter: Secondary | ICD-10-CM

## 2011-10-08 DIAGNOSIS — S42209A Unspecified fracture of upper end of unspecified humerus, initial encounter for closed fracture: Secondary | ICD-10-CM

## 2011-10-08 DIAGNOSIS — S42202A Unspecified fracture of upper end of left humerus, initial encounter for closed fracture: Secondary | ICD-10-CM

## 2011-10-08 MED ORDER — HYDROCODONE-ACETAMINOPHEN 7.5-325 MG PO TABS: 1.0000 | ORAL_TABLET | ORAL | Status: DC | PRN

## 2011-10-08 NOTE — Patient Instructions (Signed)
Wear sling x 2 weeks   Apply warm compresses to area for 20 minutes 3 x a day

## 2011-10-08 NOTE — Progress Notes (Signed)
     31 year old male, 48 hours ago, had a large pipe fall on his LEFT triceps. A large hematoma. No signs of compartment syndrome although he has some tingling in his LEFT upper extremity.  Hospital records have been reviewed. I discussed this with the ER physician. Previously.  Exam shows a large hematoma in the triceps. He can open and close his hand and flex and extend his LEFT wrist. He has decreased sensation in the ulnar nerve distribution and somewhat in the sensory radial nerve.  The compartments are soft.  X-rays are negative for acute injury.  Impression contusion, LEFT arm.  Rest, one compression, return 2 weeks reexamine

## 2011-10-22 ENCOUNTER — Ambulatory Visit: Payer: Self-pay | Admitting: Orthopedic Surgery

## 2011-10-29 ENCOUNTER — Encounter: Payer: Self-pay | Admitting: Orthopedic Surgery

## 2011-10-29 ENCOUNTER — Ambulatory Visit: Payer: Self-pay | Admitting: Orthopedic Surgery

## 2011-11-04 ENCOUNTER — Emergency Department (HOSPITAL_COMMUNITY)
Admission: EM | Admit: 2011-11-04 | Discharge: 2011-11-04 | Disposition: A | Discharge status: Home / Self Care | Payer: Medicaid Other | Attending: Emergency Medicine | Admitting: Emergency Medicine

## 2011-11-04 ENCOUNTER — Encounter (HOSPITAL_COMMUNITY): Payer: Self-pay | Admitting: Emergency Medicine

## 2011-11-04 ENCOUNTER — Emergency Department (HOSPITAL_COMMUNITY): Payer: Medicaid Other

## 2011-11-04 DIAGNOSIS — S7002XA Contusion of left hip, initial encounter: Secondary | ICD-10-CM

## 2011-11-04 DIAGNOSIS — F17200 Nicotine dependence, unspecified, uncomplicated: Secondary | ICD-10-CM | POA: Insufficient documentation

## 2011-11-04 DIAGNOSIS — X58XXXA Exposure to other specified factors, initial encounter: Secondary | ICD-10-CM | POA: Insufficient documentation

## 2011-11-04 DIAGNOSIS — Z91199 Patient's noncompliance with other medical treatment and regimen due to unspecified reason: Secondary | ICD-10-CM | POA: Insufficient documentation

## 2011-11-04 DIAGNOSIS — Z9119 Patient's noncompliance with other medical treatment and regimen: Secondary | ICD-10-CM | POA: Insufficient documentation

## 2011-11-04 DIAGNOSIS — R569 Unspecified convulsions: Secondary | ICD-10-CM | POA: Insufficient documentation

## 2011-11-04 DIAGNOSIS — R0682 Tachypnea, not elsewhere classified: Secondary | ICD-10-CM | POA: Insufficient documentation

## 2011-11-04 DIAGNOSIS — R000 Tachycardia, unspecified: Secondary | ICD-10-CM | POA: Insufficient documentation

## 2011-11-04 DIAGNOSIS — I10 Essential (primary) hypertension: Secondary | ICD-10-CM | POA: Insufficient documentation

## 2011-11-04 DIAGNOSIS — S7000XA Contusion of unspecified hip, initial encounter: Secondary | ICD-10-CM | POA: Insufficient documentation

## 2011-11-04 DIAGNOSIS — M25539 Pain in unspecified wrist: Secondary | ICD-10-CM | POA: Insufficient documentation

## 2011-11-04 DIAGNOSIS — S300XXA Contusion of lower back and pelvis, initial encounter: Secondary | ICD-10-CM

## 2011-11-04 DIAGNOSIS — M25559 Pain in unspecified hip: Secondary | ICD-10-CM | POA: Insufficient documentation

## 2011-11-04 DIAGNOSIS — S60219A Contusion of unspecified wrist, initial encounter: Secondary | ICD-10-CM | POA: Insufficient documentation

## 2011-11-04 DIAGNOSIS — S20229A Contusion of unspecified back wall of thorax, initial encounter: Secondary | ICD-10-CM | POA: Insufficient documentation

## 2011-11-04 DIAGNOSIS — S60212A Contusion of left wrist, initial encounter: Secondary | ICD-10-CM

## 2011-11-04 LAB — PHENYTOIN LEVEL, TOTAL: Phenytoin Lvl: <2.5 ug/mL — ABNORMAL LOW (ref 10.0–20.0)

## 2011-11-04 MED ORDER — SODIUM CHLORIDE 0.9 % IV SOLN
Freq: Once | INTRAVENOUS | Status: AC
  Administered 2011-11-04: 15:00:00 via INTRAVENOUS

## 2011-11-04 MED ORDER — MORPHINE SULFATE 4 MG/ML IJ SOLN
4.0000 mg | Freq: Once | INTRAMUSCULAR | Status: AC
  Administered 2011-11-04: 4 mg via INTRAVENOUS
  Filled 2011-11-04: qty 1

## 2011-11-04 MED ORDER — PHENYTOIN SODIUM EXTENDED 100 MG PO CAPS: 200.0000 mg | ORAL_CAPSULE | Freq: Every day | ORAL | Status: DC

## 2011-11-04 MED ORDER — SODIUM CHLORIDE 0.9 % IV SOLN
1000.0000 mg | Freq: Once | INTRAVENOUS | Status: AC
  Administered 2011-11-04: 1000 mg via INTRAVENOUS
  Filled 2011-11-04: qty 20

## 2011-11-04 NOTE — ED Notes (Signed)
Pt alert and oriented x 4 with respirations even and unlabored.  NAD at this time.  Discharge instructions reviewed with pt and pt verbalized understanding.  Pt stated father will transport him home.  Pt ambulated with steady gait to lobby.

## 2011-11-04 NOTE — ED Notes (Signed)
Pt states he had a seizure earlier and had a witnessed one on the over to the ed.

## 2011-11-04 NOTE — ED Provider Notes (Addendum)
History     CSN: [id number] Arrival date & time: 11/04/2011  2:41 PM   First MD Initiated Contact with Patient 11/04/11 1527      Chief Complaint  Patient presents with  . Seizures    (Consider location/radiation/quality/duration/timing/severity/associated sxs/prior treatment) The history is provided by the patient.   30 year old male with a seizure disorder states that he ran out of his Dilantin for days ago and had a seizure today. He is complaining of pain in his lower back, left hip and left wrist following the seizure. He rates pain at 8/10. He states that he usually will have a seizure within 3-4 days of when he runs out of his Dilantin. He does not have a PCP, and gets his Dilantin prescriptions from the emergency department. He did have urinary incontinence but denies fecal incontinence and denies bit lip or bit tongue. Past Medical History  Diagnosis Date  . Anxiety   . Seizures   . Depression   . Seizures     Past Surgical History  Procedure Date  . Nose surgery     Family History  Problem Relation Age of Onset  . Heart disease    . Arthritis      History  Substance Use Topics  . Smoking status: Current Everyday Smoker -- 0.5 packs/day for 7 years    Types: Cigarettes    Last Attempt to Quit: 09/19/2011  . Smokeless tobacco: Never Used  . Alcohol Use: Yes     occasional beer      Review of Systems  All other systems reviewed and are negative.    Allergies  Tramadol  Home Medications   Current Outpatient Rx  Name Route Sig Dispense Refill  . ASPIRIN-ACETAMINOPHEN 500-325 MG PO PACK Oral Take 1 packet by mouth daily as needed. For shoulder pain     . DIAZEPAM 5 MG PO TABS Oral Take 5 mg by mouth 2 (two) times daily.      . IBUPROFEN 200 MG PO TABS Oral Take 400 mg by mouth 2 (two) times daily as needed. For shoulder pain     . PHENYTOIN SODIUM EXTENDED 100 MG PO CAPS Oral Take 200 mg by mouth at bedtime.       BP 166/102  Pulse 138   Temp(Src) 99 F (37.2 C) (Oral)  Resp 24  Ht 5\' 9"  (1.753 m)  Wt 170 lb (77.111 kg)  BMI 25.10 kg/m2  SpO2 100%  Physical Exam  Nursing note and vitals reviewed.  30 year old male who is resting comfortably and in no acute distress. Vital signs are significant for tachycardia heart rate 138, tachypnea with respiratory rate of 24 and hypertension with blood pressure 166/102. Head is normocephalic and atraumatic. PERRLA, EOMI. Oropharynx is clear. Neck is supple and nontender without adenopathy or JVD. Back is as moderate tenderness over the mid lumbar area, no CVA tenderness. Lungs are clear without rales, wheezes, rhonchi. Heart has regular rate and rhythm without murmur. Abdomen is soft and nontender without masses or hepatosplenomegaly. Extremities there is no swelling or deformity noted, but there is pain on range of motion of the left hip and left wrist. There is tenderness to palpation over the left wrist and left hip without point tenderness. Neurovascular examination is intact. No other extremity injuries seen. Neurologic: Mental status is normal, cranial nerves are intact, there are no motor or sensory deficits. Psychiatric: No abnormalities of mood or affect. ED Course  Procedures (including critical care time)   Labs  Reviewed  PHENYTOIN LEVEL, TOTAL   No results found.   No diagnosis found.    MDM  Seizure secondary to medication noncompliance. X-rays have been ordered to rule out fractures where he is complaining of pain.        Dione Booze, MD 11/04/11 1604  Dione Booze, MD 12/24/11 [phone]

## 2011-11-04 NOTE — ED Notes (Signed)
Patient transported to X-ray 

## 2011-11-13 ENCOUNTER — Ambulatory Visit (INDEPENDENT_AMBULATORY_CARE_PROVIDER_SITE_OTHER): Payer: Self-pay | Admitting: Orthopedic Surgery

## 2011-11-13 ENCOUNTER — Encounter: Payer: Self-pay | Admitting: Orthopedic Surgery

## 2011-11-13 VITALS — BP 138/90 | Ht 68.0 in | Wt 171.0 lb

## 2011-11-13 DIAGNOSIS — S40022A Contusion of left upper arm, initial encounter: Secondary | ICD-10-CM

## 2011-11-13 DIAGNOSIS — S40029A Contusion of unspecified upper arm, initial encounter: Secondary | ICD-10-CM

## 2011-11-13 DIAGNOSIS — S4990XA Unspecified injury of shoulder and upper arm, unspecified arm, initial encounter: Secondary | ICD-10-CM | POA: Insufficient documentation

## 2011-11-13 DIAGNOSIS — S4980XA Other specified injuries of shoulder and upper arm, unspecified arm, initial encounter: Secondary | ICD-10-CM

## 2011-11-13 MED ORDER — HYDROCODONE-ACETAMINOPHEN 7.5-325 MG PO TABS: 1.0000 | ORAL_TABLET | ORAL | Status: DC | PRN

## 2011-11-13 NOTE — Progress Notes (Signed)
   Followup visit  LEFT upper extremity contusion  History of seizure disorder had another seizure.  Has a proximal humerus fracture with some malalignment which continues to bother him.  His range of motion has not returned to normal and he will need some corrective surgery in the future  He only had 70 of abduction.  Pain with this.  He is probably having impingement of his greater tuberosity.  Possibilities include tubercleplasty vs. Osteotomy  Also will need MRI to assess rotator cuff  CT Scan as been done  He will followup when he gets his Medicaid card  In the meantime he will continue with hydrocodone 7.5 mg.  The contusion resolved.

## 2011-12-14 ENCOUNTER — Emergency Department (HOSPITAL_COMMUNITY)
Admission: EM | Admit: 2011-12-14 | Discharge: 2011-12-15 | Disposition: A | Discharge status: Home / Self Care | Payer: Medicaid Other | Attending: Emergency Medicine | Admitting: Emergency Medicine

## 2011-12-14 ENCOUNTER — Emergency Department (HOSPITAL_COMMUNITY): Payer: Medicaid Other

## 2011-12-14 ENCOUNTER — Encounter (HOSPITAL_COMMUNITY): Payer: Self-pay

## 2011-12-14 ENCOUNTER — Other Ambulatory Visit: Payer: Self-pay

## 2011-12-14 DIAGNOSIS — S1093XA Contusion of unspecified part of neck, initial encounter: Secondary | ICD-10-CM | POA: Insufficient documentation

## 2011-12-14 DIAGNOSIS — W2203XA Walked into furniture, initial encounter: Secondary | ICD-10-CM | POA: Insufficient documentation

## 2011-12-14 DIAGNOSIS — R569 Unspecified convulsions: Secondary | ICD-10-CM | POA: Insufficient documentation

## 2011-12-14 DIAGNOSIS — K047 Periapical abscess without sinus: Secondary | ICD-10-CM

## 2011-12-14 DIAGNOSIS — I498 Other specified cardiac arrhythmias: Secondary | ICD-10-CM | POA: Insufficient documentation

## 2011-12-14 DIAGNOSIS — F3289 Other specified depressive episodes: Secondary | ICD-10-CM | POA: Insufficient documentation

## 2011-12-14 DIAGNOSIS — F17200 Nicotine dependence, unspecified, uncomplicated: Secondary | ICD-10-CM | POA: Insufficient documentation

## 2011-12-14 DIAGNOSIS — R220 Localized swelling, mass and lump, head: Secondary | ICD-10-CM | POA: Insufficient documentation

## 2011-12-14 DIAGNOSIS — F411 Generalized anxiety disorder: Secondary | ICD-10-CM | POA: Insufficient documentation

## 2011-12-14 DIAGNOSIS — S0003XA Contusion of scalp, initial encounter: Secondary | ICD-10-CM | POA: Insufficient documentation

## 2011-12-14 DIAGNOSIS — F329 Major depressive disorder, single episode, unspecified: Secondary | ICD-10-CM | POA: Insufficient documentation

## 2011-12-14 DIAGNOSIS — W1809XA Striking against other object with subsequent fall, initial encounter: Secondary | ICD-10-CM | POA: Insufficient documentation

## 2011-12-14 LAB — CBC
Hemoglobin: 17.3 g/dL — ABNORMAL HIGH (ref 13.0–17.0)
MCH: 33.6 pg (ref 26.0–34.0)
Platelets: 235 10*3/uL (ref 150–400)
RBC: 5.15 MIL/uL (ref 4.22–5.81)

## 2011-12-14 LAB — RAPID URINE DRUG SCREEN, HOSP PERFORMED
Amphetamines: NOT DETECTED
Barbiturates: NOT DETECTED
Tetrahydrocannabinol: POSITIVE — AB

## 2011-12-14 LAB — URINALYSIS, ROUTINE W REFLEX MICROSCOPIC
Glucose, UA: NEGATIVE mg/dL
Leukocytes, UA: NEGATIVE
Nitrite: NEGATIVE
Protein, ur: NEGATIVE mg/dL
Urobilinogen, UA: 0.2 mg/dL (ref 0.0–1.0)

## 2011-12-14 LAB — ETHANOL: Alcohol, Ethyl (B): 84 mg/dL — ABNORMAL HIGH (ref 0–11)

## 2011-12-14 LAB — BASIC METABOLIC PANEL
CO2: 25 mEq/L (ref 19–32)
Calcium: 10.7 mg/dL — ABNORMAL HIGH (ref 8.4–10.5)
GFR calc non Af Amer: >90 mL/min (ref >90)
Glucose, Bld: 92 mg/dL (ref 70–99)
Potassium: 3.7 mEq/L (ref 3.5–5.1)
Sodium: 139 mEq/L (ref 135–145)

## 2011-12-14 LAB — PHENYTOIN LEVEL, TOTAL: Phenytoin Lvl: <2.5 ug/mL — ABNORMAL LOW (ref 10.0–20.0)

## 2011-12-14 MED ORDER — PHENYTOIN SODIUM 50 MG/ML IJ SOLN
INTRAMUSCULAR | Status: AC
  Filled 2011-12-14: qty 20

## 2011-12-14 MED ORDER — SODIUM CHLORIDE 0.9 % IV SOLN
1000.0000 mg | Freq: Once | INTRAVENOUS | Status: AC
  Administered 2011-12-14: 1000 mg via INTRAVENOUS
  Filled 2011-12-14: qty 20

## 2011-12-14 MED ORDER — HYDROCODONE-ACETAMINOPHEN 5-325 MG PO TABS
2.0000 | ORAL_TABLET | Freq: Once | ORAL | Status: AC
  Administered 2011-12-14: 2 via ORAL
  Filled 2011-12-14: qty 2

## 2011-12-14 NOTE — ED Notes (Signed)
Patient states that he thinks he had a seizure today, history of seizures. States he has not been compliant with his dilantin. States he had three beers at dinner prior to the episode. Patient is alert and oriented. States he thinks he hit his head but is unclear as to what he hit it on. Also states that he is having trouble talking due to his anxiety.

## 2011-12-14 NOTE — ED Notes (Signed)
Pt waiting for ct. Lying in bed c/o pain to left side of face.

## 2011-12-14 NOTE — ED Notes (Signed)
Notified AC of need for dilantin drip. Stated he was going to get it.

## 2011-12-14 NOTE — ED Notes (Signed)
Thinks he may have had a seizure today, not taking dilantin as ordered, strong etoh noted--3 beers?. Thinks may have hit head/neck, left side of face swollen.  Pt alert at arrival

## 2011-12-14 NOTE — ED Provider Notes (Addendum)
History     CSN: [id number]  Arrival date & time 12/14/11  2206   First MD Initiated Contact with Patient 12/14/11 2231      Chief Complaint  Patient presents with  . Seizures    (Consider location/radiation/quality/duration/timing/severity/associated sxs/prior treatment) HPI Comments: Alan Jennings is a 30 y.o. Male with a h/o seizures, anxiety, depression who presents to the Emergency Department complaining of seizure.He got out of bed and woke up in the floor. He had struck the left side of his face on a bedside  table.  C/o headache and pain to the left side of face. He states he has been out of his medicines for over three days. He has recently been approved for Medicaid and will be able to see a PCP and neurologist. Most recently his prescriptions have been from  the ER.   No PCP   Past Medical History  Diagnosis Date  . Anxiety   . Seizures   . Depression   . Seizures     Past Surgical History  Procedure Date  . Nose surgery     Family History  Problem Relation Age of Onset  . Heart disease    . Arthritis      History  Substance Use Topics  . Smoking status: Current Everyday Smoker -- 0.5 packs/day for 7 years    Types: Cigarettes    Last Attempt to Quit: 09/19/2011  . Smokeless tobacco: Never Used  . Alcohol Use: Yes     occasional beer      Review of Systems  Allergies  Tramadol  Home Medications   Current Outpatient Rx  Name Route Sig Dispense Refill  . ASPIRIN-ACETAMINOPHEN 500-325 MG PO PACK Oral Take 1 packet by mouth daily as needed. For shoulder pain     . DIAZEPAM 5 MG PO TABS Oral Take 5 mg by mouth 2 (two) times daily.      Marland Kitchen HYDROCODONE-ACETAMINOPHEN 7.5-325 MG PO TABS Oral Take 1 tablet by mouth every 4 (four) hours as needed. 84 tablet 5  . IBUPROFEN 200 MG PO TABS Oral Take 400 mg by mouth 2 (two) times daily as needed. For shoulder pain     . PHENYTOIN SODIUM EXTENDED 100 MG PO CAPS Oral Take 200 mg by mouth at bedtime.     Marland Kitchen  PHENYTOIN SODIUM EXTENDED 100 MG PO CAPS Oral Take 2 capsules (200 mg total) by mouth daily. 60 capsule 0    BP 148/98  Pulse 115  Temp(Src) 97.7 F (36.5 C) (Oral)  Resp 26  Ht 6' (1.829 m)  Wt 170 lb (77.111 kg)  BMI 23.06 kg/m2  SpO2 100%  Physical Exam  Nursing note and vitals reviewed. Constitutional: He is oriented to person, place, and time. He appears well-developed and well-nourished. No distress.  HENT:  Head: Normocephalic.  Right Ear: External ear normal.  Left Ear: External ear normal.  Nose: Nose normal.  Mouth/Throat: Oropharynx is clear and moist.       Small bruise to lateral aspect of left eye. Mild swelling to right cheek. No bony abnormalities palpable. No crepitus  Eyes: Conjunctivae and EOM are normal. Pupils are equal, round, and reactive to light.  Neck: Normal range of motion. Neck supple.  Cardiovascular: Normal rate, normal heart sounds and intact distal pulses.   Pulmonary/Chest: Effort normal and breath sounds normal.  Abdominal: Soft. Bowel sounds are normal.  Musculoskeletal: Normal range of motion.  Neurological: He is alert and oriented to person, place,  and time. He has normal reflexes.  Skin: Skin is warm and dry.    ED Course  Procedures (including critical care time)   Date: 12/14/2011  2225  Rate: 108  Rhythm: sinus tachycardia  QRS Axis: normal  Intervals: normal  ST/T Wave abnormalities: normal  Conduction Disutrbances:none  Narrative Interpretation:   Old EKG Reviewed: none available  Results for orders placed during the hospital encounter of 12/14/11  CBC      Component Value Range   WBC 6.4  4.0 - 10.5 (K/uL)   RBC 5.15  4.22 - 5.81 (MIL/uL)   Hemoglobin 17.3 (*) 13.0 - 17.0 (g/dL)   HCT 16.1  09.6 - 04.5 (%)   MCV 96.5  78.0 - 100.0 (fL)   MCH 33.6  26.0 - 34.0 (pg)   MCHC 34.8  30.0 - 36.0 (g/dL)   RDW 40.9  81.1 - 91.4 (%)   Platelets 235  150 - 400 (K/uL)  BASIC METABOLIC PANEL      Component Value Range    Sodium 139  135 - 145 (mEq/L)   Potassium 3.7  3.5 - 5.1 (mEq/L)   Chloride 100  96 - 112 (mEq/L)   CO2 25  19 - 32 (mEq/L)   Glucose, Bld 92  70 - 99 (mg/dL)   BUN 12  6 - 23 (mg/dL)   Creatinine, Ser 7.82  0.50 - 1.35 (mg/dL)   Calcium 95.6 (*) 8.4 - 10.5 (mg/dL)   GFR calc non Af Amer >90  >90 (mL/min)   GFR calc Af Amer >90  >90 (mL/min)  PHENYTOIN LEVEL, TOTAL      Component Value Range   Phenytoin Lvl <2.5 (*) 10.0 - 20.0 (ug/mL)  ETHANOL      Component Value Range   Alcohol, Ethyl (B) 84 (*) 0 - 11 (mg/dL)  URINE RAPID DRUG SCREEN (HOSP PERFORMED)      Component Value Range   Opiates NONE DETECTED  NONE DETECTED    Cocaine NONE DETECTED  NONE DETECTED    Benzodiazepines POSITIVE (*) NONE DETECTED    Amphetamines NONE DETECTED  NONE DETECTED    Tetrahydrocannabinol POSITIVE (*) NONE DETECTED    Barbiturates NONE DETECTED  NONE DETECTED   URINALYSIS, ROUTINE W REFLEX MICROSCOPIC      Component Value Range   Color, Urine YELLOW  YELLOW    APPearance CLEAR  CLEAR    Specific Gravity, Urine 1.015  1.005 - 1.030    pH 7.0  5.0 - 8.0    Glucose, UA NEGATIVE  NEGATIVE (mg/dL)   Hgb urine dipstick NEGATIVE  NEGATIVE    Bilirubin Urine NEGATIVE  NEGATIVE    Ketones, ur NEGATIVE  NEGATIVE (mg/dL)   Protein, ur NEGATIVE  NEGATIVE (mg/dL)   Urobilinogen, UA 0.2  0.0 - 1.0 (mg/dL)   Nitrite NEGATIVE  NEGATIVE    Leukocytes, UA NEGATIVE  NEGATIVE     Ct Head Wo Contrast  12/14/2011  *RADIOLOGY REPORT*  Clinical Data:  Seizures.  Fell.  Trauma the left side of the head and face.  CT HEAD WITHOUT CONTRAST CT MAXILLOFACIAL WITHOUT CONTRAST  Technique:  Multidetector CT imaging of the head and maxillofacial structures were performed using the standard protocol without intravenous contrast. Multiplanar CT image reconstructions of the maxillofacial structures were also generated.  Comparison:  CT of the head 08/13/2012and 06/10/2011  CT HEAD  Findings: There is no intra or extra-axial  fluid collection or mass lesion.  The basilar cisterns and ventricles have  a normal appearance.  There is no CT evidence for acute infarction or hemorrhage.  Bone windows show no calvarial fracture.  Visualized paranasal and mastoid air cells are well-aerated.  IMPRESSION: No evidence for acute  abnormality.  CT MAXILLOFACIAL  Findings:   The orbits, nasal bones, paranasal sinuses are intact. The mandible, temporal mandibular joints are intact.  The zygomatic arches, pterygoid plates are intact.  Note is made of significant carious involvement of tooth number 31, also associated with peri apical abscess of this tooth.  IMPRESSION:  1.  No evidence for acute facial fracture. 2.  Extensive caries involving tooth number 31.  Periapical abscess of tooth number 31.  Original Report Authenticated By: Patterson Hammersmith, M.D.   Ct Maxillofacial Wo Cm  12/14/2011  *RADIOLOGY REPORT*  Clinical Data:  Seizures.  Fell.  Trauma the left side of the head and face.  CT HEAD WITHOUT CONTRAST CT MAXILLOFACIAL WITHOUT CONTRAST  Technique:  Multidetector CT imaging of the head and maxillofacial structures were performed using the standard protocol without intravenous contrast. Multiplanar CT image reconstructions of the maxillofacial structures were also generated.  Comparison:  CT of the head 08/13/2012and 06/10/2011  CT HEAD  Findings: There is no intra or extra-axial fluid collection or mass lesion.  The basilar cisterns and ventricles have a normal appearance.  There is no CT evidence for acute infarction or hemorrhage.  Bone windows show no calvarial fracture.  Visualized paranasal and mastoid air cells are well-aerated.  IMPRESSION: No evidence for acute  abnormality.  CT MAXILLOFACIAL  Findings:   The orbits, nasal bones, paranasal sinuses are intact. The mandible, temporal mandibular joints are intact.  The zygomatic arches, pterygoid plates are intact.  Note is made of significant carious involvement of tooth number 31,  also associated with peri apical abscess of this tooth.  IMPRESSION:  1.  No evidence for acute facial fracture. 2.  Extensive caries involving tooth number 31.  Periapical abscess of tooth number 31.  Original Report Authenticated By: Patterson Hammersmith, M.D.     MDM  Patient with a seizure disorder out of medicines x 3 days. Seizure at home, fell and hit his face on the bedside table. CTs negative for acute findings with the exception of beginning abscess in tooth 31. Initiated antibiotic treatment. Patient received IV dilantin and Rx for #100. Pt feels improved after observation and/or treatment in ED.Pt stable in ED with no significant deterioration in condition.The patient appears reasonably screened and/or stabilized for discharge and I doubt any other medical condition or other [facility] requiring further screening, evaluation, or treatment in the ED at this time prior to discharge.   MDM Reviewed: nursing note and vitals Reviewed previous: labs Interpretation: labs, CT scan and ECG          Nicoletta Dress. Colon Branch, MD 12/15/11 6962  Nicoletta Dress. Colon Branch, MD 12/15/11 [phone]

## 2011-12-14 NOTE — ED Notes (Signed)
Dr. Colon Branch notified of patient requesting pain medication.

## 2011-12-15 MED ORDER — PENICILLIN V POTASSIUM 250 MG PO TABS
500.0000 mg | ORAL_TABLET | Freq: Once | ORAL | Status: AC
  Administered 2011-12-15: 500 mg via ORAL
  Filled 2011-12-15: qty 2

## 2011-12-15 MED ORDER — PHENYTOIN SODIUM EXTENDED 100 MG PO CAPS: ORAL_CAPSULE | ORAL | Status: DC

## 2011-12-15 MED ORDER — PHENYTOIN SODIUM EXTENDED 100 MG PO CAPS: 100.0000 mg | ORAL_CAPSULE | Freq: Three times a day (TID) | ORAL | Status: DC

## 2011-12-15 MED ORDER — PENICILLIN V POTASSIUM 500 MG PO TABS: 500.0000 mg | ORAL_TABLET | Freq: Four times a day (QID) | ORAL | Status: AC

## 2011-12-16 HISTORY — PX: SHOULDER SURGERY: SHX246

## 2012-01-26 ENCOUNTER — Telehealth: Payer: Self-pay | Admitting: Orthopedic Surgery

## 2012-01-26 NOTE — Telephone Encounter (Signed)
He needs an appointment to be seen.

## 2012-01-26 NOTE — Telephone Encounter (Signed)
Office notes received from [facility] from office visit 01/23/12. Appointment scheduled for here.

## 2012-01-26 NOTE — Telephone Encounter (Signed)
Received call from Daiva Nakayama from [facility], ph [phone], called to give referral and NPI authorization information# [id number]. She states patient had been seen there Friday 01/23/12 and had relayed to them that he has been treating with Dr. Romeo Apple re: left shoulder and will need MRI and may need surgery, which has been pending insurance.  He has been approved for Medicaid.  Medicaid # is [id number].  I called patient and he has received a new card.  He will bring in the card so that we may proceed with MRI as per last office note 11/12/12.

## 2012-01-27 ENCOUNTER — Ambulatory Visit (INDEPENDENT_AMBULATORY_CARE_PROVIDER_SITE_OTHER): Payer: Medicaid Other | Admitting: Orthopedic Surgery

## 2012-01-27 ENCOUNTER — Encounter: Payer: Self-pay | Admitting: Orthopedic Surgery

## 2012-01-27 DIAGNOSIS — S40029A Contusion of unspecified upper arm, initial encounter: Secondary | ICD-10-CM

## 2012-01-27 DIAGNOSIS — S42309A Unspecified fracture of shaft of humerus, unspecified arm, initial encounter for closed fracture: Secondary | ICD-10-CM

## 2012-01-27 DIAGNOSIS — S40022A Contusion of left upper arm, initial encounter: Secondary | ICD-10-CM

## 2012-01-27 DIAGNOSIS — F17200 Nicotine dependence, unspecified, uncomplicated: Secondary | ICD-10-CM

## 2012-01-27 MED ORDER — HYDROCODONE-ACETAMINOPHEN 7.5-325 MG PO TABS: 1.0000 | ORAL_TABLET | ORAL | Status: DC | PRN

## 2012-01-27 MED ORDER — NICOTINE 21 MG/24HR TD PT24: 1.0000 | MEDICATED_PATCH | TRANSDERMAL | Status: DC

## 2012-01-27 NOTE — Patient Instructions (Signed)
STOP SMOKING   START NICOTINE PATCHES   WE WILL SCHEDULE SURGERY IN A FEW WEEKS

## 2012-01-27 NOTE — Progress Notes (Signed)
Patient ID: Alan Jennings, male   DOB: [birth year], 31 y.o.   MRN: [id number]   Subjective:    Alan Jennings is a 31 y.o. male who presents with continued problems with his LEFT shoulder after a proximal humerus fracture approximately a year ago.  He was treated nonoperatively at an angulation at the fracture site.  We tried to treat this nonoperatively as this are reduced he'll but he was unable to get back to normal activity.  He has pain in the medial border of the scapula radiates to her shoulder and down into his upper arm.  CT Scan was then performed as well as plain films and a serial basis and has an angulated fracture at apex anterior/posterior angulation  This will require reconstructive osteotomy with open treatment internal fixation of the LEFT humerus with bone grafting.  A proximal humeral plate should be sufficient.  The best thing to probably do is to go ahead and lift the deltoid up preserving the axillary nerve performing the fracture fixation and bone graft and then lain in the deltoid back down and reattaching. Review of Systems A comprehensive review of systems was negative except for: smoking   Objective:    BP 140/90  Ht 6' (1.829 m)  Wt 170 lb (77.111 kg)  BMI 23.06 kg/m2 Right shoulder: normal active ROM, no tenderness, no impingement sign  Left shoulder: LEFT shoulder today we will begin a range of motion check on him passive external rotation is 70.  Passive abduction 70 passive forward elevation 70  Active range of motion 30 external rotation, 40 abduction and 40 elevation.  Painful range of motion with impingement signs.     Assessment:    Left angulated fracture LEFT shoulder    Plan:    the patient is advised that he needs to stop smoking.  He was placed on nicotine patches.  We will arrange surgical procedure in about 3 weeks we will test him for nicotine in 2 weeks

## 2012-02-11 ENCOUNTER — Encounter: Payer: Self-pay | Admitting: Orthopedic Surgery

## 2012-02-11 ENCOUNTER — Ambulatory Visit (INDEPENDENT_AMBULATORY_CARE_PROVIDER_SITE_OTHER): Payer: Medicaid Other | Admitting: Orthopedic Surgery

## 2012-02-11 VITALS — BP 140/80 | Ht 72.0 in | Wt 170.0 lb

## 2012-02-11 DIAGNOSIS — Z01818 Encounter for other preprocedural examination: Secondary | ICD-10-CM

## 2012-02-11 NOTE — Progress Notes (Signed)
Patient ID: Alan Jennings, male   DOB: [birth year], 31 y.o.   MRN: [id number] Chief Complaint  Patient presents with  . Pre-op Exam    pre op and nicotine test     Preop visit for osteotomy, LEFT humerus open treatment internal fixation with bone graft for malunion, LEFT proximal humerus fracture with pain, stiffness, impingement.  Range of motion limited. Flexion, 0

## 2012-02-11 NOTE — Patient Instructions (Signed)
You will be scheduled for surgery after I review you blood work as we discussed   Please stop all blood thinners ibuprofen Naprosyn aspirin Plavix Coumadin  The office will call you with the details   Remember these are the Potential complications of the surgery. Neurovascular injury. The bone does not heal. The hardware breaks. Weakness, stiffness, loss of motion.  You  will have an osteotomy rebreak the bone , realignment and plate fixation with bone grafting.

## 2012-02-12 LAB — NICOTINE/COTININE METABOLITES: Cotinine: 45 ng/mL

## 2012-02-12 LAB — CBC WITH DIFFERENTIAL/PLATELET
Basophils Absolute: 0 10*3/uL (ref 0.0–0.1)
Basophils Relative: 0 % (ref 0–1)
Eosinophils Absolute: 0 10*3/uL (ref 0.0–0.7)
MCHC: 33.2 g/dL (ref 30.0–36.0)
Neutro Abs: 6.6 10*3/uL (ref 1.7–7.7)
Neutrophils Relative %: 71 % (ref 43–77)
Platelets: 234 10*3/uL (ref 150–400)
RDW: 12.5 % (ref 11.5–15.5)

## 2012-02-12 LAB — BASIC METABOLIC PANEL
BUN: 13 mg/dL (ref 6–23)
Calcium: 9.6 mg/dL (ref 8.4–10.5)
Potassium: 4.7 mEq/L (ref 3.5–5.3)
Sodium: 139 mEq/L (ref 135–145)

## 2012-02-17 ENCOUNTER — Encounter (HOSPITAL_COMMUNITY): Payer: Self-pay | Admitting: Pharmacy Technician

## 2012-02-18 ENCOUNTER — Encounter (HOSPITAL_COMMUNITY)
Admission: RE | Admit: 2012-02-18 | Discharge: 2012-02-18 | Disposition: A | Discharge status: Home / Self Care | Payer: Medicaid Other | Source: Ambulatory Visit | Attending: Orthopedic Surgery | Admitting: Orthopedic Surgery

## 2012-02-18 ENCOUNTER — Emergency Department (HOSPITAL_COMMUNITY)
Admission: EM | Admit: 2012-02-18 | Discharge: 2012-02-18 | Disposition: A | Discharge status: Home / Self Care | Payer: Medicaid Other | Attending: Emergency Medicine | Admitting: Emergency Medicine

## 2012-02-18 ENCOUNTER — Encounter (HOSPITAL_COMMUNITY): Payer: Self-pay | Admitting: *Deleted

## 2012-02-18 ENCOUNTER — Emergency Department (HOSPITAL_COMMUNITY): Payer: Medicaid Other

## 2012-02-18 ENCOUNTER — Other Ambulatory Visit: Payer: Self-pay

## 2012-02-18 DIAGNOSIS — F411 Generalized anxiety disorder: Secondary | ICD-10-CM | POA: Insufficient documentation

## 2012-02-18 DIAGNOSIS — R0602 Shortness of breath: Secondary | ICD-10-CM | POA: Insufficient documentation

## 2012-02-18 DIAGNOSIS — I10 Essential (primary) hypertension: Secondary | ICD-10-CM | POA: Insufficient documentation

## 2012-02-18 DIAGNOSIS — R002 Palpitations: Secondary | ICD-10-CM | POA: Insufficient documentation

## 2012-02-18 DIAGNOSIS — R05 Cough: Secondary | ICD-10-CM | POA: Insufficient documentation

## 2012-02-18 DIAGNOSIS — R000 Tachycardia, unspecified: Secondary | ICD-10-CM

## 2012-02-18 DIAGNOSIS — I498 Other specified cardiac arrhythmias: Secondary | ICD-10-CM | POA: Insufficient documentation

## 2012-02-18 DIAGNOSIS — R059 Cough, unspecified: Secondary | ICD-10-CM | POA: Insufficient documentation

## 2012-02-18 DIAGNOSIS — R569 Unspecified convulsions: Secondary | ICD-10-CM | POA: Insufficient documentation

## 2012-02-18 DIAGNOSIS — R61 Generalized hyperhidrosis: Secondary | ICD-10-CM | POA: Insufficient documentation

## 2012-02-18 DIAGNOSIS — R079 Chest pain, unspecified: Secondary | ICD-10-CM | POA: Insufficient documentation

## 2012-02-18 DIAGNOSIS — M25519 Pain in unspecified shoulder: Secondary | ICD-10-CM | POA: Insufficient documentation

## 2012-02-18 LAB — BASIC METABOLIC PANEL
Calcium: 10.3 mg/dL (ref 8.4–10.5)
Chloride: 97 mEq/L (ref 96–112)
Creatinine, Ser: 0.9 mg/dL (ref 0.50–1.35)
GFR calc Af Amer: >90 mL/min (ref >90)

## 2012-02-18 LAB — ETHANOL: Alcohol, Ethyl (B): <11 mg/dL (ref 0–11)

## 2012-02-18 LAB — CBC
MCV: 95.4 fL (ref 78.0–100.0)
Platelets: 216 10*3/uL (ref 150–400)
RDW: 12.4 % (ref 11.5–15.5)
WBC: 9.3 10*3/uL (ref 4.0–10.5)

## 2012-02-18 LAB — URINALYSIS, ROUTINE W REFLEX MICROSCOPIC
Bilirubin Urine: NEGATIVE
Ketones, ur: NEGATIVE mg/dL
Nitrite: NEGATIVE
Protein, ur: NEGATIVE mg/dL
Urobilinogen, UA: 0.2 mg/dL (ref 0.0–1.0)

## 2012-02-18 LAB — RAPID URINE DRUG SCREEN, HOSP PERFORMED: Barbiturates: NOT DETECTED

## 2012-02-18 LAB — ACETAMINOPHEN LEVEL: Acetaminophen (Tylenol), Serum: <15 ug/mL (ref 10–30)

## 2012-02-18 LAB — D-DIMER, QUANTITATIVE: D-Dimer, Quant: <0.22 ug/mL-FEU (ref 0.00–0.48)

## 2012-02-18 LAB — TROPONIN I: Troponin I: <0.3 ng/mL (ref <0.30)

## 2012-02-18 MED ORDER — METOPROLOL TARTRATE 25 MG PO TABS: 25.0000 mg | ORAL_TABLET | Freq: Two times a day (BID) | ORAL | Status: DC

## 2012-02-18 MED ORDER — DIAZEPAM 5 MG PO TABS: 5.0000 mg | ORAL_TABLET | Freq: Two times a day (BID) | ORAL | Status: AC

## 2012-02-18 MED ORDER — LORAZEPAM 2 MG/ML IJ SOLN
1.0000 mg | Freq: Once | INTRAMUSCULAR | Status: AC
  Administered 2012-02-18: 2 mg via INTRAVENOUS
  Filled 2012-02-18: qty 1

## 2012-02-18 MED ORDER — SODIUM CHLORIDE 0.9 % IV BOLUS (SEPSIS)
1000.0000 mL | Freq: Once | INTRAVENOUS | Status: AC
  Administered 2012-02-18: 1000 mL via INTRAVENOUS

## 2012-02-18 NOTE — ED Notes (Signed)
Called into room due to pt's CP increased and sob, HR 140's, while in room HR decreased to99-100's and CP eases some, BP 152/96, O2 2L/M applied, sats 100%

## 2012-02-18 NOTE — ED Notes (Signed)
Pt in preop for shoulder surgery, pt began to have CP with nausea, SOB and diaphoresis today

## 2012-02-18 NOTE — ED Notes (Signed)
Pt admits to being anxious to due surgery, hx of anxiety and takes diazepam for it

## 2012-02-18 NOTE — ED Notes (Signed)
Pt was at preop appt and began having CP to center chest and left shoulder area. Sent here from preop for hypertension.

## 2012-02-18 NOTE — Patient Instructions (Addendum)
20 Alan Jennings  02/18/2012   Your procedure is scheduled on:   02/23/2012  Report to [facility] at  615  AM.  Call this number if you have problems the morning of surgery: [phone]   Remember:   Do not eat food:After Midnight.  May have clear liquids:until Midnight .  Clear liquids include soda, tea, black coffee, apple or grape juice, broth.  Take these medicines the morning of surgery with A SIP OF WATER: valium,norco,dilantin   Do not wear jewelry, make-up or nail polish.  Do not wear lotions, powders, or perfumes. You may wear deodorant.  Do not shave 48 hours prior to surgery.  Do not bring valuables to the hospital.  Contacts, dentures or bridgework may not be worn into surgery.  Leave suitcase in the car. After surgery it may be brought to your room.  For patients admitted to the hospital, checkout time is 11:00 AM the day of discharge.   Patients discharged the day of surgery will not be allowed to drive home.  Name and phone number of your driver: family  Special Instructions: CHG Shower Use Special Wash: 1/2 bottle night before surgery and 1/2 bottle morning of surgery.   Please read over the following fact sheets that you were given: Pain Booklet, MRSA Information, Surgical Site Infection Prevention, Anesthesia Post-op Instructions and Care and Recovery After Surgery Humerus Fracture, Treated with Open Reduction You have a fracture (break in bone) of your humerus. This is the large bone in your upper arm. These fractures are easily diagnosed with x-rays. TREATMENT  Simple fractures that will heal without disability are treated with simple immobilization. This is often followed with early range of motion exercises to keep the shoulder from becoming frozen (stuck in one position). Your caregiver feels you have an unstable fracture. Unstable fractures are treated with an open reduction (operation) and internal fixation. A screw is put into the fracture to hold the bone pieces in  position while they heal. This is done to obtain the best possible result for shoulder function. These fixation devices are often left in place after healing. They generally do not cause problems and you usually will not know they are there. RELATED COMPLICATIONS The most common complication of upper arm fractures is adhesive capsulitis. This means the shoulder is frozen or difficult to move. Other complications include infection, non-union or malunion of the bones. This means the bones do not heal in the correct position or direction. BEFORE AND AFTER YOUR SURGERY Prior to surgery, an IV (intravenous line connected to your vein for giving fluids) may be started. You will be given an anesthetic (medications and gas to make you sleep). After surgery, you will be taken to the recovery area where a nurse will watch your progress. You may have a catheter (a long, narrow, hollow tube) in your bladder that helps you pass your water. Once you're awake, stable, and taking fluids well, you'll be returned to your room. You will receive physical therapy and other care until you are doing well and your caregiver feels it is safe for you to be transferred either to home or to an extended care facility. HOME CARE INSTRUCTIONS  You may resume normal diet and activities as directed or allowed.   Change dressings if necessary or as instructed.   Only take over-the-counter or prescription medicines for pain, discomfort, or fever as directed by your caregiver.  SEEK IMMEDIATE MEDICAL CARE IF:   There is redness, swelling, or increasing  pain in the wound.   There is pus coming from wound.   An unexplained oral temperature above 102 F (38.9 C) develops, or as your caregiver suggests.   A bad smell is coming from the wound or dressing.   The edges of the wound are not staying together after sutures or staples have been removed.  MAKE SURE YOU:   Understand these instructions.   Will watch your condition.    Will get help right away if you are not doing well or get worse.  Document Released: 05/27/2001 Document Revised: 11/20/2011 Document Reviewed: 07/21/2008 [facility] Patient Information 2012 [place], [place].PATIENT INSTRUCTIONS POST-ANESTHESIA  IMMEDIATELY FOLLOWING SURGERY:  Do not drive or operate machinery for the first twenty four hours after surgery.  Do not make any important decisions for twenty four hours after surgery or while taking narcotic pain medications or sedatives.  If you develop intractable nausea and vomiting or a severe headache please notify your doctor immediately.  FOLLOW-UP:  Please make an appointment with your surgeon as instructed. You do not need to follow up with anesthesia unless specifically instructed to do so.  WOUND CARE INSTRUCTIONS (if applicable):  Keep a dry clean dressing on the anesthesia/puncture wound site if there is drainage.  Once the wound has quit draining you may leave it open to air.  Generally you should leave the bandage intact for twenty four hours unless there is drainage.  If the epidural site drains for more than 36-48 hours please call the anesthesia department.  QUESTIONS?:  Please feel free to call your physician or the hospital operator if you have any questions, and they will be happy to assist you.     [facility] Anesthesia Department [address] [place] [phone]

## 2012-02-18 NOTE — ED Provider Notes (Signed)
History  This chart was scribed for Alan Racer, MD by Bennett Scrape. This patient was seen in room APA12/APA12 and the patient's care was started at 3:18PM.  CSN: [id number]  Arrival date & time 02/18/12  1356   First MD Initiated Contact with Patient 02/18/12 1504      Chief Complaint  Patient presents with  . Chest Pain  . Hypertension    Patient is a 31 y.o. male presenting with chest pain. The history is provided by the patient. No language interpreter was used.  Chest Pain The chest pain began 1 - 2 hours ago. Duration of episode(s) is 30 minutes. Chest pain occurs constantly. The pain is associated with stress. The quality of the pain is described as vice-like. The pain radiates to the left shoulder. Chest pain is worsened by stress. Primary symptoms include shortness of breath, cough and palpitations. Pertinent negatives for primary symptoms include no fever, no fatigue, no abdominal pain, no nausea, no vomiting and no dizziness.  The palpitations also occurred with shortness of breath. The palpitations did not occur with dizziness.  Pertinent negatives for associated symptoms include no diaphoresis, no numbness and no weakness.  His past medical history is significant for anxiety/panic attacks and seizures.     Alan Jennings is a 31 y.o. male with a h/o chronic chest pain and HTN sent from a preoperative appointment for chest pain and HTN who presents to the Emergency Department complaining of 2 hours of sudden onset, gradually improving, constant, sternally located chest pain with radiation to the left shoulder. Pt describes the chest pain as being a knotted, vice-like feeling that last about 30 minutes. He reports palpitations, cold sweats and SOB as associated symptoms. Pt is scheduled to have left arm surgery performed by Dr. Romeo Apple on Monday but states that it has been delayed due to onset of chest pain and HTN symptoms. He states that he has been experiencing chronic  palpations for over a year but has not been to see a cardiologist because of insufficient funds. He reports previous episodes of similar chest pain that resolved on their own. He has a h/o anxiety and states that the chest pain can be brought on by anxiety or stress. He has not taken any medications PTA to improve symptoms. He denies leg swelling, diaphoresis and nausea as associated symptoms. He also c/o 2 weeks of a productive cough of mucous that is worse in the morning. He admits to marijuana and alcohol use but denies any other illicit drug use. He reports using 3 to 4 packets of Goody Headache Powder a day for shoulder pain.  Past Medical History  Diagnosis Date  . Anxiety   . Seizures   . Depression   . Seizures     Past Surgical History  Procedure Date  . Nose surgery     Family History  Problem Relation Age of Onset  . Heart disease    . Arthritis      History  Substance Use Topics  . Smoking status: Former Smoker -- 0.5 packs/day for 7 years    Types: Cigarettes    Quit date: 09/19/2011  . Smokeless tobacco: Never Used  . Alcohol Use: Yes     occasional beer      Review of Systems  Constitutional: Negative for fever, diaphoresis and fatigue.  HENT: Negative for congestion, sore throat and neck pain.   Eyes: Negative for pain.  Respiratory: Positive for cough and shortness of breath.  Cardiovascular: Positive for palpitations.  Gastrointestinal: Negative for nausea, vomiting and abdominal pain.  Genitourinary: Negative for dysuria and hematuria.  Musculoskeletal: Negative for back pain.  Skin: Negative for rash.  Neurological: Positive for seizures. Negative for dizziness, weakness and numbness.  Psychiatric/Behavioral: Negative for confusion.    Allergies  Tramadol  Home Medications   Current Outpatient Rx  Name Route Sig Dispense Refill  . GOODY HEADACHE PO Oral Take 1 packet by mouth 4 (four) times daily as needed. For pain    .  HYDROCODONE-ACETAMINOPHEN 7.5-325 MG PO TABS Oral Take 1 tablet by mouth every 4 (four) hours as needed. For pain    . NICOTINE 21 MG/24HR TD PT24 Transdermal Place 1 patch onto the skin daily.    Marland Kitchen PHENYTOIN SODIUM EXTENDED 100 MG PO CAPS Oral Take 200 mg by mouth daily.     Marland Kitchen DIAZEPAM 5 MG PO TABS Oral Take 1 tablet (5 mg total) by mouth 2 (two) times daily. 10 tablet 0  . METOPROLOL TARTRATE 25 MG PO TABS Oral Take 1 tablet (25 mg total) by mouth 2 (two) times daily. 30 tablet 0    Triage Vitals: BP 156/107  Pulse 108  Temp(Src) 98.9 F (37.2 C) (Oral)  Resp 16  Ht 5\' 8"  (1.727 m)  Wt 180 lb (81.647 kg)  BMI 27.37 kg/m2  SpO2 99%  Physical Exam  Nursing note and vitals reviewed. Constitutional: He appears well-developed and well-nourished.       Pt is anxious appearing  HENT:  Head: Normocephalic and atraumatic.  Mouth/Throat: Oropharynx is clear and moist.       Moist mucous membranes  Eyes: EOM are normal. Pupils are equal, round, and reactive to light.  Cardiovascular: Regular rhythm.  Exam reveals no gallop and no friction rub.   No murmur heard.      Tachycardic, 2+ distal pulses; equal bilaterally  Pulmonary/Chest:       Lungs are clear bilaterally  Abdominal: Soft. He exhibits no distension and no mass. There is no tenderness.  Musculoskeletal: He exhibits no edema (No peripheral edema) and no tenderness (No calf).  Lymphadenopathy:    He has no cervical adenopathy.  Skin: Skin is warm and dry.  Psychiatric: He has a normal mood and affect. His behavior is normal.    ED Course  Procedures (including critical care time)  DIAGNOSTIC STUDIES: Oxygen Saturation is 100% on room air, normal by my interpretation.    COORDINATION OF CARE: 3:28PM-Discussed treatment plan with pt and pt agreed to plan. 4:44PM-Consulted with Dr. Allyson Sabal, with [facility], over pt's symptoms. Dr. Allyson Sabal wants to start pt on 25mg  metoprolol and follow up with pt as an outpatient  in a few days. Pt will be discharged home. 5:11PM-Pt informed of consult and metoprolol prescription. Pt agreed to follow-up with Dr. Allyson Sabal.   Labs Reviewed  CBC - Abnormal; Notable for the following:    Hemoglobin 17.3 (*)    All other components within normal limits  BASIC METABOLIC PANEL - Abnormal; Notable for the following:    Glucose, Bld 102 (*)    All other components within normal limits  URINALYSIS, ROUTINE W REFLEX MICROSCOPIC - Abnormal; Notable for the following:    Specific Gravity, Urine <1.005 (*)    All other components within normal limits  URINE RAPID DRUG SCREEN (HOSP PERFORMED) - Abnormal; Notable for the following:    Opiates POSITIVE (*)    Benzodiazepines POSITIVE (*)    Tetrahydrocannabinol POSITIVE (*)  All other components within normal limits  SALICYLATE LEVEL - Abnormal; Notable for the following:    Salicylate Lvl <2.0 (*)    All other components within normal limits  TROPONIN I  ACETAMINOPHEN LEVEL  ETHANOL  D-DIMER, QUANTITATIVE   Dg Chest 2 View  02/18/2012  *RADIOLOGY REPORT*  Clinical Data: Chest pain  CHEST - 2 VIEW  Comparison: 12/16/2010  Findings: Normal heart size.  Clear lungs.  No pneumothorax.  IMPRESSION: No active cardiopulmonary disease.  Original Report Authenticated By: Donavan Burnet, M.D.      Date: 02/18/2012  Rate: 123  Rhythm: sinus tachycardia  QRS Axis: normal  Intervals: normal  ST/T Wave abnormalities: normal  Conduction Disutrbances:none  Narrative Interpretation:   Old EKG Reviewed: unchanged    1. Sinus tachycardia   2. Hypertension       MDM   Resting HR 105-110. Will have f/u with cardiology. Return for concerns     I personally performed the services described in this documentation, which was scribed in my presence. The recorded information has been reviewed and considered.     Alan Racer, MD 02/18/12 2101

## 2012-02-18 NOTE — Discharge Instructions (Signed)
Stop taking Goody's powder and other forms of caffeine. Dr Allyson Sabal will call you in 2-3 days to set up an appointment for further evaluation. If you have not heard from him, call his office. Take medications as prescribed. Return immediately for worsening symptoms or any concerns    Tachycardia, Nonspecific In adults, the heart normally beats between 60 and 100 times a minute. A heart rate over 100 is called tachycardia. When your heart beats too fast, it may not be able to pump enough blood to the rest of the body. CAUSES   Exercise or exertion.   Fever.   Pain or injury.   Infection.   Loss of fluid (dehydration).   Overactive thyroid.   Lack of red blood cells (anemia).   Anxiety.   Alcohol.   Heart arrhythmia.   Caffeine.   Tobacco products.   Diet pills.   Street drugs.   Heart disease.  SYMPTOMS  Palpitations (rapid or irregular heartbeat).   Dizziness.   Tiredness (fatigue).   Shortness of breath.  DIAGNOSIS  After an exam and taking a history, your caregiver may order:  Blood tests.   Electrocardiogram (EKG).   Heart monitor.  TREATMENT  Treatment will depend on the cause and potential for harm. It may include:  Intravenous (IV) replacement of fluids or blood.   Antidote or reversal medicines.   Changes in your present medicines.   Lifestyle changes.  HOME CARE INSTRUCTIONS   Get rest.   Drink enough water and fluids to keep your urine clear or pale yellow.   Avoid:   Caffeine.   Nicotine.   Alcohol.   Stress.   Chocolate.   Stimulants.   Only take medicine as directed by your caregiver.  SEEK IMMEDIATE MEDICAL CARE IF:   You have pain in your chest, upper arms, jaw, or neck.   You become weak, dizzy, or feel faint.   You have palpitations that will not go away.   You throw up (vomit), have diarrhea, or pass blood.   You look pale and your skin is cool and wet.  MAKE SURE YOU:   Understand these instructions.    Will watch your condition.   Will get help right away if you are not doing well or get worse.  Document Released: 01/08/2005 Document Revised: 11/20/2011 Document Reviewed: 11/11/2011 [facility] Patient Information 2012 [place], [place].

## 2012-02-18 NOTE — ED Notes (Signed)
States that it feels like a "knot" to mid chest

## 2012-02-18 NOTE — Pre-Procedure Instructions (Signed)
Patient in for preop interview. Face flushed, bp-155/103. Patient states his blood pressure has been elevated at home for several months and he has not been seen or treated for this. He states that he is also having some chest discomfort-burning and pain today. Patient escorted to ed accompanied by father. Dr Jayme Cloud aware of above. Dr Alto Denver caleed and aware that patient will need to be worked up for hypertension and on meds for 5 days proir to being rescheduled for surgery, Called [facility] Dept and appointment made and given to father for Monday 3//10/2012 at 0930.

## 2012-02-19 ENCOUNTER — Telehealth: Payer: Self-pay | Admitting: Orthopedic Surgery

## 2012-02-19 NOTE — Telephone Encounter (Signed)
Alan Jennings check on his pain meds   Is he due for a refill ?

## 2012-02-19 NOTE — Telephone Encounter (Signed)
Patient called, states surgery being cancelled d/t BP, now asking about pain medication refill (Norco) until re-schedule of surgery, as well as refill on nicotine patches.  York Spaniel he has changed pharmacy to [facility], [place].  He relates also that he is scheduled for appointment Monday, 02/23/12, with primary care physician regarding blood pressure.  Please advise.  His ph# is [phone].

## 2012-02-20 ENCOUNTER — Other Ambulatory Visit: Payer: Self-pay | Admitting: *Deleted

## 2012-02-20 MED ORDER — HYDROCODONE-ACETAMINOPHEN 7.5-325 MG PO TABS: 1.0000 | ORAL_TABLET | ORAL | Status: DC | PRN

## 2012-02-20 MED ORDER — NICOTINE 21 MG/24HR TD PT24: 1.0000 | MEDICATED_PATCH | TRANSDERMAL | Status: DC

## 2012-02-20 NOTE — Telephone Encounter (Signed)
2 week pain med refill sent as well as nicotine patch script

## 2012-02-23 ENCOUNTER — Ambulatory Visit (HOSPITAL_COMMUNITY): Admission: RE | Admit: 2012-02-23 | Payer: Medicaid Other | Source: Ambulatory Visit | Admitting: Orthopedic Surgery

## 2012-02-23 ENCOUNTER — Encounter (HOSPITAL_COMMUNITY): Admission: RE | Payer: Self-pay | Source: Ambulatory Visit

## 2012-02-23 SURGERY — OPEN REDUCTION INTERNAL FIXATION (ORIF) PROXIMAL HUMERUS FRACTURE
Anesthesia: General | Site: Arm Upper | Laterality: Left

## 2012-02-25 ENCOUNTER — Ambulatory Visit: Payer: Medicaid Other | Admitting: Orthopedic Surgery

## 2012-03-01 ENCOUNTER — Encounter (HOSPITAL_COMMUNITY): Payer: Self-pay

## 2012-03-01 ENCOUNTER — Emergency Department (HOSPITAL_COMMUNITY)
Admission: EM | Admit: 2012-03-01 | Discharge: 2012-03-01 | Disposition: A | Discharge status: Home / Self Care | Payer: Medicaid Other | Attending: Emergency Medicine | Admitting: Emergency Medicine

## 2012-03-01 ENCOUNTER — Emergency Department (HOSPITAL_COMMUNITY): Payer: Medicaid Other

## 2012-03-01 DIAGNOSIS — I10 Essential (primary) hypertension: Secondary | ICD-10-CM | POA: Insufficient documentation

## 2012-03-01 DIAGNOSIS — S20219A Contusion of unspecified front wall of thorax, initial encounter: Secondary | ICD-10-CM | POA: Insufficient documentation

## 2012-03-01 DIAGNOSIS — R569 Unspecified convulsions: Secondary | ICD-10-CM | POA: Insufficient documentation

## 2012-03-01 DIAGNOSIS — S61409A Unspecified open wound of unspecified hand, initial encounter: Secondary | ICD-10-CM | POA: Insufficient documentation

## 2012-03-01 DIAGNOSIS — W268XXA Contact with other sharp object(s), not elsewhere classified, initial encounter: Secondary | ICD-10-CM | POA: Insufficient documentation

## 2012-03-01 DIAGNOSIS — R079 Chest pain, unspecified: Secondary | ICD-10-CM | POA: Insufficient documentation

## 2012-03-01 DIAGNOSIS — S61411A Laceration without foreign body of right hand, initial encounter: Secondary | ICD-10-CM

## 2012-03-01 HISTORY — DX: Essential (primary) hypertension: I10

## 2012-03-01 MED ORDER — OXYCODONE-ACETAMINOPHEN 5-325 MG PO TABS: ORAL_TABLET | ORAL | Status: DC

## 2012-03-01 MED ORDER — CEPHALEXIN 500 MG PO CAPS
500.0000 mg | ORAL_CAPSULE | Freq: Once | ORAL | Status: AC
  Administered 2012-03-01: 500 mg via ORAL
  Filled 2012-03-01: qty 1

## 2012-03-01 MED ORDER — BACITRACIN ZINC 500 UNIT/GM EX OINT
TOPICAL_OINTMENT | CUTANEOUS | Status: AC
  Administered 2012-03-01: 1
  Filled 2012-03-01: qty 0.9

## 2012-03-01 MED ORDER — OXYCODONE-ACETAMINOPHEN 5-325 MG PO TABS
1.0000 | ORAL_TABLET | Freq: Once | ORAL | Status: AC
  Administered 2012-03-01: 1 via ORAL
  Filled 2012-03-01: qty 1

## 2012-03-01 MED ORDER — CEPHALEXIN 500 MG PO CAPS: 500.0000 mg | ORAL_CAPSULE | Freq: Four times a day (QID) | ORAL | Status: AC
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
## 2012-03-01 NOTE — ED Provider Notes (Signed)
Medical screening examination/treatment/procedure(s) were performed by non-physician practitioner and as supervising physician I was immediately available for consultation/collaboration.   Dione Booze, MD 03/01/12 [id number]
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
## 2012-03-01 NOTE — ED Notes (Signed)
Patient transported to X-ray 

## 2012-03-01 NOTE — ED Notes (Signed)
Applied rib belt, says it feels better. Bandage to hand to cover wound and to limit motion of middle finger.
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
## 2012-03-01 NOTE — ED Notes (Signed)
Patient is resting comfortably. 

## 2012-03-23 ENCOUNTER — Other Ambulatory Visit: Payer: Self-pay | Admitting: Orthopedic Surgery

## 2012-04-07 ENCOUNTER — Ambulatory Visit (INDEPENDENT_AMBULATORY_CARE_PROVIDER_SITE_OTHER): Payer: Medicaid Other | Admitting: Orthopedic Surgery

## 2012-04-07 ENCOUNTER — Encounter: Payer: Self-pay | Admitting: Orthopedic Surgery

## 2012-04-07 VITALS — BP 130/84 | Ht 69.0 in | Wt 186.0 lb

## 2012-04-07 DIAGNOSIS — M25512 Pain in left shoulder: Secondary | ICD-10-CM

## 2012-04-07 DIAGNOSIS — M25519 Pain in unspecified shoulder: Secondary | ICD-10-CM

## 2012-04-07 DIAGNOSIS — S42202A Unspecified fracture of upper end of left humerus, initial encounter for closed fracture: Secondary | ICD-10-CM

## 2012-04-07 DIAGNOSIS — S42209A Unspecified fracture of upper end of unspecified humerus, initial encounter for closed fracture: Secondary | ICD-10-CM

## 2012-04-07 DIAGNOSIS — S42309A Unspecified fracture of shaft of humerus, unspecified arm, initial encounter for closed fracture: Secondary | ICD-10-CM

## 2012-04-07 MED ORDER — HYDROCODONE-ACETAMINOPHEN 7.5-325 MG PO TABS: 1.0000 | ORAL_TABLET | Freq: Four times a day (QID) | ORAL | Status: DC | PRN

## 2012-04-07 NOTE — Patient Instructions (Addendum)
Preop:  You have been scheduled for surgery.  All surgeries carry some risk.  Remember you always have the option of continued nonsurgical treatment. However in this situation the risks vs. the benefits favor surgery as the best treatment option. The risks of the surgery includes the following but is not limited to bleeding, infection, pulmonary embolus, death from anesthesia, nerve injury vascular injury or need for further surgery, continued pain.  Specific to this procedure the following risks and complications are rare but possible Stiffness, pain, weakness, 3 fracture, nonunion  I expect 9-12 months to fully recover from this procedure.  Please stop any medications that can cause your blood within these include medications like aspirin, Goody's powders, ibuprofen, Naprosyn

## 2012-04-07 NOTE — Progress Notes (Signed)
Patient ID: Alan Jennings, male   DOB: [birth year], 31 y.o.   MRN: [id number] Chief Complaint  Patient presents with  . Pre-op Exam    preop for left humerus OTIF    Reevaluation for preoperative checkup for LEFT shoulder humeral and malunion.  The patient was scheduled for surgery canceled her blood pressure problems. The health Department so him placed him on atenolol. His blood pressures and, control he's not had a seizure. He will reschedule for May 13.

## 2012-04-19 ENCOUNTER — Encounter (HOSPITAL_COMMUNITY): Payer: Self-pay | Admitting: Pharmacy Technician

## 2012-04-19 NOTE — Patient Instructions (Signed)
Alan Jennings  04/19/2012   Your procedure is scheduled on:  Monday, 04/26/12  [facility] to [facility].  Call this number if you have problems the morning of surgery: [phone]   Remember:   Do not eat food:After Midnight.  May have clear liquids:until Midnight .  Clear liquids include soda, tea, black coffee, apple or grape juice, broth.  Take these medicines the morning of surgery with A SIP OF WATER: tenoretic, norco, and dilantin   Do not wear jewelry, make-up or nail polish.  Do not wear lotions, powders, or perfumes. You may wear deodorant.  Do not shave 48 hours prior to surgery.  Do not bring valuables to the hospital.  Contacts, dentures or bridgework may not be worn into surgery.  Leave suitcase in the car. After surgery it may be brought to your room.  For patients admitted to the hospital, checkout time is 11:00 AM the day of discharge.   Patients discharged the day of surgery will not be allowed to drive home.  Name and phone number of your driver: driver  Special Instructions: Incentive Spirometry - Practice and bring it with you on the day of surgery. and CHG Shower Use Special Wash: 1/2 bottle night before surgery and 1/2 bottle morning of surgery.   Please read over the following fact sheets that you were given: Pain Booklet, Coughing and Deep Breathing, MRSA Information, Surgical Site Infection Prevention, Anesthesia Post-op Instructions and Care and Recovery After Surgery   Humerus Fracture, Treated with Open Reduction You have a fracture (break in bone) of your humerus. This is the large bone in your upper arm. These fractures are easily diagnosed with x-rays. TREATMENT  Simple fractures that will heal without disability are treated with simple immobilization. This is often followed with early range of motion exercises to keep the shoulder from becoming frozen (stuck in one position). Your caregiver feels you have an unstable fracture. Unstable fractures are  treated with an open reduction (operation) and internal fixation. A screw is put into the fracture to hold the bone pieces in position while they heal. This is done to obtain the best possible result for shoulder function. These fixation devices are often left in place after healing. They generally do not cause problems and you usually will not know they are there. RELATED COMPLICATIONS The most common complication of upper arm fractures is adhesive capsulitis. This means the shoulder is frozen or difficult to move. Other complications include infection, non-union or malunion of the bones. This means the bones do not heal in the correct position or direction. BEFORE AND AFTER YOUR SURGERY Prior to surgery, an IV (intravenous line connected to your vein for giving fluids) may be started. You will be given an anesthetic (medications and gas to make you sleep). After surgery, you will be taken to the recovery area where a nurse will watch your progress. You may have a catheter (a long, narrow, hollow tube) in your bladder that helps you pass your water. Once you're awake, stable, and taking fluids well, you'll be returned to your room. You will receive physical therapy and other care until you are doing well and your caregiver feels it is safe for you to be transferred either to home or to an extended care facility. HOME CARE INSTRUCTIONS  You may resume normal diet and activities as directed or allowed.   Change dressings if necessary or as instructed.   Only take over-the-counter or prescription medicines for pain,  discomfort, or fever as directed by your caregiver.  SEEK IMMEDIATE MEDICAL CARE IF:   There is redness, swelling, or increasing pain in the wound.   There is pus coming from wound.   An unexplained oral temperature above 102 F (38.9 C) develops, or as your caregiver suggests.   A bad smell is coming from the wound or dressing.   The edges of the wound are not staying together after  sutures or staples have been removed.  MAKE SURE YOU:   Understand these instructions.   Will watch your condition.   Will get help right away if you are not doing well or get worse.  Document Released: 05/27/2001 Document Revised: 11/20/2011 Document Reviewed: 07/21/2008 [facility] Patient Information 2012 [place], [place].

## 2012-04-20 ENCOUNTER — Encounter (HOSPITAL_COMMUNITY): Payer: Self-pay

## 2012-04-20 ENCOUNTER — Encounter (HOSPITAL_COMMUNITY)
Admission: RE | Admit: 2012-04-20 | Discharge: 2012-04-20 | Disposition: A | Discharge status: Home / Self Care | Payer: Medicaid Other | Source: Ambulatory Visit | Attending: Orthopedic Surgery | Admitting: Orthopedic Surgery

## 2012-04-20 HISTORY — DX: Adverse effect of unspecified anesthetic, initial encounter: T41.45XA

## 2012-04-20 HISTORY — DX: Other complications of anesthesia, initial encounter: T88.59XA

## 2012-04-20 LAB — BASIC METABOLIC PANEL: GFR calc Af Amer: >90 mL/min (ref >90)

## 2012-04-20 LAB — HEMOGLOBIN AND HEMATOCRIT, BLOOD
HCT: 49.3 % (ref 39.0–52.0)
Hemoglobin: 17.3 g/dL — ABNORMAL HIGH (ref 13.0–17.0)

## 2012-04-20 LAB — SURGICAL PCR SCREEN
MRSA, PCR: NEGATIVE
Staphylococcus aureus: POSITIVE — AB

## 2012-04-22 ENCOUNTER — Telehealth: Payer: Self-pay | Admitting: Orthopedic Surgery

## 2012-04-22 NOTE — Telephone Encounter (Addendum)
Contacted Medicaid's 3rd party pre-authorization carrier, [employer] at ph# [phone], regarding surgery scheduled 04/26/12 at [facility], CPT [phone], [phone], OTIF left humerus. Per automated voice response system, no prior authorization is required.
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
## 2012-04-26 ENCOUNTER — Encounter (HOSPITAL_COMMUNITY): Payer: Self-pay | Admitting: Anesthesiology

## 2012-04-26 ENCOUNTER — Encounter (HOSPITAL_COMMUNITY)
Admission: RE | Disposition: A | Discharge status: Home / Self Care | Payer: Self-pay | Source: Ambulatory Visit | Attending: Orthopedic Surgery

## 2012-04-26 ENCOUNTER — Inpatient Hospital Stay (HOSPITAL_COMMUNITY)
Admission: RE | Admit: 2012-04-26 | Discharge: 2012-04-27 | DRG: 494 | Disposition: A | Discharge status: Home / Self Care | Payer: Medicaid Other | Source: Ambulatory Visit | Attending: Orthopedic Surgery | Admitting: Orthopedic Surgery

## 2012-04-26 ENCOUNTER — Inpatient Hospital Stay (HOSPITAL_COMMUNITY): Payer: Medicaid Other

## 2012-04-26 ENCOUNTER — Inpatient Hospital Stay (HOSPITAL_COMMUNITY): Payer: Medicaid Other | Admitting: Anesthesiology

## 2012-04-26 ENCOUNTER — Encounter (HOSPITAL_COMMUNITY): Payer: Self-pay | Admitting: *Deleted

## 2012-04-26 DIAGNOSIS — S42209A Unspecified fracture of upper end of unspecified humerus, initial encounter for closed fracture: Secondary | ICD-10-CM

## 2012-04-26 DIAGNOSIS — Z87891 Personal history of nicotine dependence: Secondary | ICD-10-CM

## 2012-04-26 DIAGNOSIS — F3289 Other specified depressive episodes: Secondary | ICD-10-CM | POA: Diagnosis present

## 2012-04-26 DIAGNOSIS — M25512 Pain in left shoulder: Secondary | ICD-10-CM

## 2012-04-26 DIAGNOSIS — F411 Generalized anxiety disorder: Secondary | ICD-10-CM | POA: Diagnosis present

## 2012-04-26 DIAGNOSIS — F329 Major depressive disorder, single episode, unspecified: Secondary | ICD-10-CM | POA: Diagnosis present

## 2012-04-26 DIAGNOSIS — IMO0002 Reserved for concepts with insufficient information to code with codable children: Principal | ICD-10-CM | POA: Diagnosis present

## 2012-04-26 DIAGNOSIS — S42309S Unspecified fracture of shaft of humerus, unspecified arm, sequela: Secondary | ICD-10-CM

## 2012-04-26 DIAGNOSIS — Y33XXXS Other specified events, undetermined intent, sequela: Secondary | ICD-10-CM

## 2012-04-26 DIAGNOSIS — I10 Essential (primary) hypertension: Secondary | ICD-10-CM | POA: Diagnosis present

## 2012-04-26 DIAGNOSIS — G40909 Epilepsy, unspecified, not intractable, without status epilepticus: Secondary | ICD-10-CM | POA: Diagnosis present

## 2012-04-26 HISTORY — PX: ORIF HUMERUS FRACTURE: SHX2126

## 2012-04-26 SURGERY — OPEN REDUCTION INTERNAL FIXATION (ORIF) PROXIMAL HUMERUS FRACTURE
Anesthesia: General | Site: Arm Upper | Laterality: Left | Wound class: Clean

## 2012-04-26 MED ORDER — CELECOXIB 100 MG PO CAPS
ORAL_CAPSULE | ORAL | Status: AC
  Administered 2012-04-26: 400 mg via ORAL
  Filled 2012-04-26: qty 4

## 2012-04-26 MED ORDER — FENTANYL CITRATE 0.05 MG/ML IJ SOLN
INTRAMUSCULAR | Status: AC
  Administered 2012-04-26: 50 ug via INTRAVENOUS
  Filled 2012-04-26: qty 2

## 2012-04-26 MED ORDER — ATENOLOL 25 MG PO TABS
50.0000 mg | ORAL_TABLET | Freq: Every morning | ORAL | Status: DC
  Administered 2012-04-27: 50 mg via ORAL
  Filled 2012-04-26: qty 2

## 2012-04-26 MED ORDER — FENTANYL CITRATE 0.05 MG/ML IJ SOLN
25.0000 ug | INTRAMUSCULAR | Status: DC | PRN
  Administered 2012-04-26 (×2): 50 ug via INTRAVENOUS

## 2012-04-26 MED ORDER — CEFAZOLIN SODIUM-DEXTROSE 2-3 GM-% IV SOLR: 2.0000 g | INTRAVENOUS | Status: DC

## 2012-04-26 MED ORDER — ONDANSETRON HCL 4 MG/2ML IJ SOLN: 4.0000 mg | Freq: Four times a day (QID) | INTRAMUSCULAR | Status: DC | PRN

## 2012-04-26 MED ORDER — LACTATED RINGERS IV SOLN
INTRAVENOUS | Status: DC
  Administered 2012-04-26: 07:00:00 via INTRAVENOUS

## 2012-04-26 MED ORDER — BUPIVACAINE HCL (PF) 0.25 % IJ SOLN
INTRAMUSCULAR | Status: AC
  Filled 2012-04-26: qty 120

## 2012-04-26 MED ORDER — DOCUSATE SODIUM 100 MG PO CAPS
100.0000 mg | ORAL_CAPSULE | Freq: Two times a day (BID) | ORAL | Status: DC
  Administered 2012-04-26 – 2012-04-27 (×3): 100 mg via ORAL
  Filled 2012-04-26 (×3): qty 1

## 2012-04-26 MED ORDER — MIDAZOLAM HCL 2 MG/2ML IJ SOLN
INTRAMUSCULAR | Status: AC
  Filled 2012-04-26: qty 2

## 2012-04-26 MED ORDER — PHENYTOIN SODIUM EXTENDED 100 MG PO CAPS
100.0000 mg | ORAL_CAPSULE | Freq: Two times a day (BID) | ORAL | Status: DC
  Administered 2012-04-26 – 2012-04-27 (×2): 100 mg via ORAL
  Filled 2012-04-26 (×2): qty 1

## 2012-04-26 MED ORDER — CELECOXIB 100 MG PO CAPS
400.0000 mg | ORAL_CAPSULE | Freq: Once | ORAL | Status: AC
  Administered 2012-04-26: 400 mg via ORAL

## 2012-04-26 MED ORDER — CEFAZOLIN SODIUM-DEXTROSE 2-3 GM-% IV SOLR
INTRAVENOUS | Status: AC
  Filled 2012-04-26: qty 50

## 2012-04-26 MED ORDER — CEFAZOLIN SODIUM 1-5 GM-% IV SOLN
INTRAVENOUS | Status: DC | PRN
  Administered 2012-04-26: 2 g via INTRAVENOUS

## 2012-04-26 MED ORDER — MENTHOL 3 MG MT LOZG: 1.0000 | LOZENGE | OROMUCOSAL | Status: DC | PRN

## 2012-04-26 MED ORDER — ACETAMINOPHEN 10 MG/ML IV SOLN
INTRAVENOUS | Status: AC
  Administered 2012-04-26: 1000 mg via INTRAVENOUS
  Filled 2012-04-26: qty 100

## 2012-04-26 MED ORDER — SUFENTANIL CITRATE 50 MCG/ML IV SOLN
INTRAVENOUS | Status: AC
  Filled 2012-04-26: qty 1

## 2012-04-26 MED ORDER — ROCURONIUM BROMIDE 100 MG/10ML IV SOLN
INTRAVENOUS | Status: DC | PRN
  Administered 2012-04-26: 10 mg via INTRAVENOUS
  Administered 2012-04-26: 40 mg via INTRAVENOUS

## 2012-04-26 MED ORDER — BUPIVACAINE HCL (PF) 0.25 % IJ SOLN
INTRAMUSCULAR | Status: AC
  Filled 2012-04-26: qty 150

## 2012-04-26 MED ORDER — PHENOL 1.4 % MT LIQD: 1.0000 | OROMUCOSAL | Status: DC | PRN

## 2012-04-26 MED ORDER — HYDROMORPHONE HCL PF 1 MG/ML IJ SOLN
INTRAMUSCULAR | Status: AC
  Administered 2012-04-26: 1 mg via INTRAVENOUS
  Filled 2012-04-26: qty 1

## 2012-04-26 MED ORDER — HYDROMORPHONE HCL PF 1 MG/ML IJ SOLN
INTRAMUSCULAR | Status: AC
  Administered 2012-04-26: 0.4 mg via INTRAVENOUS
  Filled 2012-04-26: qty 1

## 2012-04-26 MED ORDER — SODIUM CHLORIDE 0.9 % IR SOLN
Status: DC | PRN
  Administered 2012-04-26 (×2): 1000 mL

## 2012-04-26 MED ORDER — METHOCARBAMOL 500 MG PO TABS
500.0000 mg | ORAL_TABLET | Freq: Four times a day (QID) | ORAL | Status: DC | PRN
  Administered 2012-04-26: 500 mg via ORAL
  Filled 2012-04-26: qty 1

## 2012-04-26 MED ORDER — OXYCODONE HCL 5 MG PO TABS
5.0000 mg | ORAL_TABLET | ORAL | Status: DC
  Administered 2012-04-26 (×2): 5 mg via ORAL
  Administered 2012-04-27 (×3): 10 mg via ORAL
  Filled 2012-04-26: qty 1
  Filled 2012-04-26 (×3): qty 2
  Filled 2012-04-26: qty 1

## 2012-04-26 MED ORDER — GLYCOPYRROLATE 0.2 MG/ML IJ SOLN
INTRAMUSCULAR | Status: AC
  Filled 2012-04-26: qty 3

## 2012-04-26 MED ORDER — ONDANSETRON HCL 4 MG/2ML IJ SOLN
4.0000 mg | Freq: Once | INTRAMUSCULAR | Status: AC | PRN
  Administered 2012-04-26: 4 mg via INTRAVENOUS

## 2012-04-26 MED ORDER — DIPHENHYDRAMINE HCL 12.5 MG/5ML PO ELIX: 12.5000 mg | ORAL_SOLUTION | ORAL | Status: DC | PRN

## 2012-04-26 MED ORDER — SUFENTANIL CITRATE 50 MCG/ML IV SOLN
INTRAVENOUS | Status: DC | PRN
  Administered 2012-04-26: 20 ug via INTRAVENOUS
  Administered 2012-04-26 (×8): 10 ug via INTRAVENOUS

## 2012-04-26 MED ORDER — LIDOCAINE HCL (PF) 1 % IJ SOLN
INTRAMUSCULAR | Status: AC
  Filled 2012-04-26: qty 5

## 2012-04-26 MED ORDER — MIDAZOLAM HCL 2 MG/2ML IJ SOLN
INTRAMUSCULAR | Status: AC
  Administered 2012-04-26: 2 mg via INTRAVENOUS
  Filled 2012-04-26: qty 2

## 2012-04-26 MED ORDER — ONDANSETRON HCL 4 MG/2ML IJ SOLN
INTRAMUSCULAR | Status: DC | PRN
  Administered 2012-04-26: 4 mg via INTRAVENOUS

## 2012-04-26 MED ORDER — METHOCARBAMOL 100 MG/ML IJ SOLN
500.0000 mg | Freq: Four times a day (QID) | INTRAVENOUS | Status: DC | PRN
  Filled 2012-04-26: qty 5

## 2012-04-26 MED ORDER — SODIUM CHLORIDE 0.9 % IV SOLN
INTRAVENOUS | Status: DC
  Administered 2012-04-26 – 2012-04-27 (×2): via INTRAVENOUS

## 2012-04-26 MED ORDER — ACETAMINOPHEN 10 MG/ML IV SOLN
1000.0000 mg | Freq: Once | INTRAVENOUS | Status: AC
  Administered 2012-04-26: 1000 mg via INTRAVENOUS

## 2012-04-26 MED ORDER — HYDROMORPHONE HCL PF 1 MG/ML IJ SOLN
0.5000 mg | INTRAMUSCULAR | Status: DC
  Administered 2012-04-26: 0.4 mg via INTRAVENOUS
  Administered 2012-04-26: 0.2 mg via INTRAVENOUS

## 2012-04-26 MED ORDER — METOCLOPRAMIDE HCL 5 MG/ML IJ SOLN
5.0000 mg | Freq: Three times a day (TID) | INTRAMUSCULAR | Status: DC | PRN
  Administered 2012-04-26: 10 mg via INTRAVENOUS
  Filled 2012-04-26: qty 2

## 2012-04-26 MED ORDER — ONDANSETRON HCL 4 MG/2ML IJ SOLN
INTRAMUSCULAR | Status: AC
  Administered 2012-04-26: 4 mg via INTRAVENOUS
  Filled 2012-04-26: qty 2

## 2012-04-26 MED ORDER — NEOSTIGMINE METHYLSULFATE 1 MG/ML IJ SOLN
INTRAMUSCULAR | Status: DC | PRN
  Administered 2012-04-26: 4 mg via INTRAVENOUS

## 2012-04-26 MED ORDER — OXYCODONE HCL 5 MG PO TABS: 5.0000 mg | ORAL_TABLET | ORAL | Status: DC

## 2012-04-26 MED ORDER — METHOCARBAMOL 100 MG/ML IJ SOLN
500.0000 mg | Freq: Once | INTRAVENOUS | Status: AC
  Administered 2012-04-26: 500 mg via INTRAVENOUS
  Filled 2012-04-26: qty 5

## 2012-04-26 MED ORDER — MIDAZOLAM HCL 2 MG/2ML IJ SOLN
1.0000 mg | INTRAMUSCULAR | Status: DC | PRN
  Administered 2012-04-26 (×2): 2 mg via INTRAVENOUS

## 2012-04-26 MED ORDER — ACETAMINOPHEN 10 MG/ML IV SOLN
1000.0000 mg | Freq: Four times a day (QID) | INTRAVENOUS | Status: AC
  Administered 2012-04-26 – 2012-04-27 (×3): 1000 mg via INTRAVENOUS
  Filled 2012-04-26 (×3): qty 100

## 2012-04-26 MED ORDER — ONDANSETRON HCL 4 MG/2ML IJ SOLN: 4.0000 mg | Freq: Once | INTRAMUSCULAR | Status: DC

## 2012-04-26 MED ORDER — ARTIFICIAL TEARS OP OINT
TOPICAL_OINTMENT | OPHTHALMIC | Status: AC
  Filled 2012-04-26: qty 3.5

## 2012-04-26 MED ORDER — ONDANSETRON HCL 4 MG PO TABS: 4.0000 mg | ORAL_TABLET | Freq: Four times a day (QID) | ORAL | Status: DC | PRN

## 2012-04-26 MED ORDER — BUPIVACAINE-EPINEPHRINE PF 0.5-1:200000 % IJ SOLN
INTRAMUSCULAR | Status: AC
  Filled 2012-04-26: qty 20

## 2012-04-26 MED ORDER — METOCLOPRAMIDE HCL 10 MG PO TABS: 5.0000 mg | ORAL_TABLET | Freq: Three times a day (TID) | ORAL | Status: DC | PRN

## 2012-04-26 MED ORDER — CEFAZOLIN SODIUM 1-5 GM-% IV SOLN
1.0000 g | Freq: Four times a day (QID) | INTRAVENOUS | Status: AC
  Administered 2012-04-26 – 2012-04-27 (×3): 1 g via INTRAVENOUS
  Filled 2012-04-26 (×3): qty 50

## 2012-04-26 MED ORDER — ONDANSETRON HCL 4 MG/2ML IJ SOLN
INTRAMUSCULAR | Status: AC
  Filled 2012-04-26: qty 2

## 2012-04-26 MED ORDER — PROPOFOL 10 MG/ML IV EMUL
INTRAVENOUS | Status: AC
  Filled 2012-04-26: qty 20

## 2012-04-26 MED ORDER — CHLORTHALIDONE 25 MG PO TABS
25.0000 mg | ORAL_TABLET | Freq: Every morning | ORAL | Status: DC
  Filled 2012-04-26 (×3): qty 1

## 2012-04-26 MED ORDER — GLYCOPYRROLATE 0.2 MG/ML IJ SOLN
INTRAMUSCULAR | Status: DC | PRN
  Administered 2012-04-26: 0.6 mg via INTRAVENOUS

## 2012-04-26 MED ORDER — PROPOFOL 10 MG/ML IV EMUL
INTRAVENOUS | Status: DC | PRN
  Administered 2012-04-26: 50 mg via INTRAVENOUS
  Administered 2012-04-26: 150 mg via INTRAVENOUS

## 2012-04-26 MED ORDER — BUPIVACAINE-EPINEPHRINE 0.5% -1:200000 IJ SOLN
INTRAMUSCULAR | Status: DC | PRN
  Administered 2012-04-26: 60 mL

## 2012-04-26 MED ORDER — CHLORHEXIDINE GLUCONATE 4 % EX LIQD
60.0000 mL | Freq: Once | CUTANEOUS | Status: DC
  Filled 2012-04-26: qty 60

## 2012-04-26 MED ORDER — LACTATED RINGERS IV SOLN
INTRAVENOUS | Status: DC | PRN
  Administered 2012-04-26 (×2): via INTRAVENOUS

## 2012-04-26 MED ORDER — LIDOCAINE HCL (CARDIAC) 10 MG/ML IV SOLN
INTRAVENOUS | Status: DC | PRN
  Administered 2012-04-26: 50 mg via INTRAVENOUS

## 2012-04-26 MED ORDER — ATENOLOL-CHLORTHALIDONE 50-25 MG PO TABS: 1.0000 | ORAL_TABLET | Freq: Every morning | ORAL | Status: DC

## 2012-04-26 MED ORDER — HYDROMORPHONE HCL PF 1 MG/ML IJ SOLN
0.5000 mg | INTRAMUSCULAR | Status: DC | PRN
  Administered 2012-04-26 – 2012-04-27 (×6): 1 mg via INTRAVENOUS
  Filled 2012-04-26 (×5): qty 1

## 2012-04-26 MED ORDER — ROCURONIUM BROMIDE 50 MG/5ML IV SOLN
INTRAVENOUS | Status: AC
  Filled 2012-04-26: qty 1

## 2012-04-26 MED ORDER — KETOROLAC TROMETHAMINE 30 MG/ML IJ SOLN
30.0000 mg | Freq: Four times a day (QID) | INTRAMUSCULAR | Status: DC
  Administered 2012-04-26 – 2012-04-27 (×3): 30 mg via INTRAVENOUS
  Filled 2012-04-26 (×3): qty 1

## 2012-04-26 MED ORDER — NEOSTIGMINE METHYLSULFATE 1 MG/ML IJ SOLN
INTRAMUSCULAR | Status: AC
  Filled 2012-04-26: qty 10

## 2012-04-26 SURGICAL SUPPLY — 104 items
ANCHOR CORKSCREW BIO 5.5 (Anchor) ×4 IMPLANT
BAG HAMPER (MISCELLANEOUS) ×2 IMPLANT
BANDAGE ELASTIC 4 VELCRO NS (GAUZE/BANDAGES/DRESSINGS) IMPLANT
BANDAGE ELASTIC 6 VELCRO NS (GAUZE/BANDAGES/DRESSINGS) IMPLANT
BANDAGE ESMARK 4X12 BL STRL LF (DISPOSABLE) IMPLANT
BIT DRILL 2.5X110 QC LCP DISP (BIT) ×2 IMPLANT
BIT DRILL PERC QC 2.8X200 100 (BIT) ×1 IMPLANT
BLADE AVERAGE 25X9 (BLADE) ×2 IMPLANT
BLADE HEX COATED 2.75 (ELECTRODE) ×2 IMPLANT
BLADE SURG SZ10 CARB STEEL (BLADE) ×4 IMPLANT
BLADE SURG SZ11 CARB STEEL (BLADE) IMPLANT
BNDG COHESIVE 4X5 TAN NS LF (GAUZE/BANDAGES/DRESSINGS) ×2 IMPLANT
BNDG ESMARK 4X12 BLUE STRL LF (DISPOSABLE)
BUR FAST CUTTING (BURR) ×1
BUR OVAL CARBIDE 4.0 (BURR) ×2 IMPLANT
BUR SRG 54X4.7X12 FLUT (BURR) ×1 IMPLANT
BURR SRG 54X4.7X12 FLUT (BURR) ×1
CAP PIN PROTECTOR ORTHO WHT (CAP) IMPLANT
CHLORAPREP W/TINT 26ML (MISCELLANEOUS) ×2 IMPLANT
CLOTH BEACON ORANGE TIMEOUT ST (SAFETY) ×2 IMPLANT
COOLER CRYO CUFF IC AND MOTOR (MISCELLANEOUS) IMPLANT
COVER LIGHT HANDLE STERIS (MISCELLANEOUS) ×4 IMPLANT
CUFF CRYO UNI SHDR 32X48 (MISCELLANEOUS) IMPLANT
DECANTER SPIKE VIAL GLASS SM (MISCELLANEOUS) ×2 IMPLANT
DRAIN TROCAR  MED 1/8 (DRAIN) ×2 IMPLANT
DRAPE C-ARM FOLDED MOBILE STRL (DRAPES) ×2 IMPLANT
DRAPE ORTHO 2.5IN SPLIT 77X108 (DRAPES) ×3 IMPLANT
DRAPE ORTHO SPLIT 77X108 STRL (DRAPES) ×3
DRAPE PROXIMA HALF (DRAPES) ×4 IMPLANT
DRAPE U-SHAPE 47X51 STRL (DRAPES) IMPLANT
DRESSING ALLEVYN BORDER HEEL (GAUZE/BANDAGES/DRESSINGS) IMPLANT
DRILL BIT QUICK COUP 2.8MM 100 (BIT) ×1
DRSG MEPILEX BORDER 4X12 (GAUZE/BANDAGES/DRESSINGS) ×2 IMPLANT
ELECT REM PT RETURN 9FT ADLT (ELECTROSURGICAL) ×2
ELECTRODE REM PT RTRN 9FT ADLT (ELECTROSURGICAL) ×1 IMPLANT
GLOVE ECLIPSE 6.5 STRL STRAW (GLOVE) ×4 IMPLANT
GLOVE EXAM NITRILE MD LF STRL (GLOVE) ×4 IMPLANT
GLOVE INDICATOR 7.0 STRL GRN (GLOVE) ×4 IMPLANT
GLOVE INDICATOR 8.0 STRL GRN (GLOVE) ×2 IMPLANT
GLOVE SKINSENSE NS SZ8.0 LF (GLOVE) ×2
GLOVE SKINSENSE STRL SZ8.0 LF (GLOVE) ×2 IMPLANT
GLOVE SS BIOGEL STRL SZ 8 (GLOVE) ×1 IMPLANT
GLOVE SS N UNI LF 8.5 STRL (GLOVE) ×2 IMPLANT
GLOVE SUPERSENSE BIOGEL SZ 8 (GLOVE) ×1
GOWN STRL REIN XL XLG (GOWN DISPOSABLE) ×8 IMPLANT
IMMOBILIZER SHDR 7X13SM BLK (ORTHOPEDIC SUPPLIES) IMPLANT
IMMOBILIZER SHOULDER LGE (ORTHOPEDIC SUPPLIES) ×2 IMPLANT
IMMOBILIZER SHOULDER MED (ORTHOPEDIC SUPPLIES) IMPLANT
INST SET MINOR BONE (KITS) ×2 IMPLANT
K-WIRE 229MX1.6 (WIRE) IMPLANT
K-WIRE 5 THRD TROCAR 1.6X150 (WIRE) ×2
K-WIRE SGL PT/SMTH 9X35 (WIRE)
KIT BLADEGUARD II DBL (SET/KITS/TRAYS/PACK) ×2 IMPLANT
KIT ROOM TURNOVER APOR (KITS) ×2 IMPLANT
KWIRE 5 THRD TROCAR 1.6X150 (WIRE) ×1 IMPLANT
KWIRE SGL PT/SMTH 9X35 (WIRE) IMPLANT
KWIRE SGL PT/SMTH 9X45IN (WIRE) IMPLANT
MANIFOLD NEPTUNE II (INSTRUMENTS) ×2 IMPLANT
MARKER SKIN DUAL TIP RULER LAB (MISCELLANEOUS) ×2 IMPLANT
NEEDLE HYPO 21X1.5 SAFETY (NEEDLE) ×2 IMPLANT
NEEDLE MAYO 6 CRC TAPER PT (NEEDLE) ×2 IMPLANT
NS IRRIG 1000ML POUR BTL (IV SOLUTION) ×4 IMPLANT
PACK BASIC III (CUSTOM PROCEDURE TRAY) ×1
PACK SRG BSC III STRL LF ECLPS (CUSTOM PROCEDURE TRAY) ×1 IMPLANT
PAD CAST 4YDX4 CTTN HI CHSV (CAST SUPPLIES) IMPLANT
PADDING CAST COTTON 4X4 STRL (CAST SUPPLIES)
PASSER SUT SWANSON 36MM LOOP (INSTRUMENTS) ×4 IMPLANT
PENCIL HANDSWITCHING (ELECTRODE) ×2 IMPLANT
PIN CAPS ORTHO GREEN .062 (PIN) IMPLANT
PLATE LCP 3.5 PROX HUM 5HX114 (Plate) ×2 IMPLANT
PUTTY DBX 5CC (Putty) ×2 IMPLANT
RETRACTOR WND ALEXIS 25 LRG (MISCELLANEOUS) ×1 IMPLANT
RTRCTR WOUND ALEXIS 25CM LRG (MISCELLANEOUS) ×2
SCREW CORTEX 3.5 26MM (Screw) ×3 IMPLANT
SCREW CORTEX 3.5 28MM (Screw) ×1 IMPLANT
SCREW LOCK CORT ST 3.5X26 (Screw) ×3 IMPLANT
SCREW LOCK CORT ST 3.5X28 (Screw) ×1 IMPLANT
SCREW LOCK T15 FT 40X3.5XST (Screw) ×1 IMPLANT
SCREW LOCKING 3.5X34 (Screw) ×4 IMPLANT
SCREW LOCKING 3.5X40 (Screw) ×1 IMPLANT
SCREW LOCKING 3.5X45 (Screw) ×2 IMPLANT
SCREW LOCKING 3.5X50 (Screw) ×2 IMPLANT
SET BASIN LINEN APH (SET/KITS/TRAYS/PACK) ×2 IMPLANT
SLING ARM FOAM STRAP LRG (SOFTGOODS) IMPLANT
SLING ARM FOAM STRAP MED (SOFTGOODS) IMPLANT
SLING ARM FOAM STRAP XLG (SOFTGOODS) IMPLANT
SPONGE LAP 18X18 X RAY DECT (DISPOSABLE) ×20 IMPLANT
SPONGE LAP 4X18 X RAY DECT (DISPOSABLE) ×2 IMPLANT
STAPLER VISISTAT 35W (STAPLE) ×2 IMPLANT
STOCKINETTE IMPERVIOUS LG (DRAPES) ×2 IMPLANT
SUT ETHIBOND NAB OS 4 #2 30IN (SUTURE) IMPLANT
SUT MON AB 0 CT1 (SUTURE) ×2 IMPLANT
SUT MON AB 2-0 CT1 36 (SUTURE) ×2 IMPLANT
SUT MON AB 2-0 SH 27 (SUTURE) ×1
SUT MON AB 2-0 SH27 (SUTURE) ×1 IMPLANT
SUT VIC AB 1 CT1 27 (SUTURE) ×2
SUT VIC AB 1 CT1 27XBRD ANTBC (SUTURE) ×2 IMPLANT
SYR 30ML LL (SYRINGE) ×2 IMPLANT
SYR BULB IRRIGATION 50ML (SYRINGE) ×4 IMPLANT
TAPE CLOTH SURG 4X10 WHT LF (GAUZE/BANDAGES/DRESSINGS) ×2 IMPLANT
TOWEL OR 17X26 4PK STRL BLUE (TOWEL DISPOSABLE) IMPLANT
YANKAUER SUCT 12FT TUBE ARGYLE (SUCTIONS) ×4 IMPLANT
YANKAUER SUCT BULB TIP 10FT TU (MISCELLANEOUS) ×4 IMPLANT
YANKAUER SUCT BULB TIP NO VENT (SUCTIONS) ×2 IMPLANT

## 2012-04-26 NOTE — Interval H&P Note (Signed)
History and Physical Interval Note:  04/26/2012 7:22 AM  Alan Jennings  has presented today for surgery, with the diagnosis of fracture of humerus  The various methods of treatment have been discussed with the patient and family. After consideration of risks, benefits and other options for treatment, the patient has consented to  Procedure(s) (LRB): OPEN REDUCTION INTERNAL FIXATION (ORIF) PROXIMAL HUMERUS FRACTURE (Left) as a surgical intervention .  The patients' history has been reviewed, patient examined, no change in status, stable for surgery.  I have reviewed the patients' chart and labs.  Questions were answered to the patient's satisfaction.     Fuller Canada

## 2012-04-26 NOTE — Anesthesia Procedure Notes (Signed)
Procedure Name: Intubation Date/Time: 04/26/2012 7:47 AM Performed by: Carolyne Littles, Amisadai Woodford L Pre-anesthesia Checklist: Patient identified, Timeout performed, Emergency Drugs available, Suction available and Patient being monitored Patient Re-evaluated:Patient Re-evaluated prior to inductionOxygen Delivery Method: Circle system utilized Preoxygenation: Pre-oxygenation with 100% oxygen Intubation Type: IV induction and Cricoid Pressure applied Ventilation: Mask ventilation without difficulty Laryngoscope Size: Miller and 3 Grade View: Grade I Tube type: Oral Placement Confirmation: ETT inserted through vocal cords under direct vision,  positive ETCO2 and breath sounds checked- equal and bilateral Secured at: 21 cm Tube secured with: Tape Dental Injury: Teeth and Oropharynx as per pre-operative assessment
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
## 2012-04-26 NOTE — Anesthesia Postprocedure Evaluation (Signed)
  Anesthesia Post-op Note  Patient: Alan Jennings  Procedure(s) Performed: Procedure(s) (LRB): OPEN REDUCTION INTERNAL FIXATION (ORIF) PROXIMAL HUMERUS FRACTURE (Left)  Patient Location: PACU  Anesthesia Type: General  Level of Consciousness: awake, alert , oriented and patient cooperative  Airway and Oxygen Therapy: Patient Spontanous Breathing and Patient connected to face mask oxygen  Post-op Pain: mild  Post-op Assessment: Post-op Vital signs reviewed, Patient's Cardiovascular Status Stable, Respiratory Function Stable, Patent Airway and No signs of Nausea or vomiting  Post-op Vital Signs: Reviewed and stable  Complications: No apparent anesthesia complications

## 2012-04-26 NOTE — Preoperative (Signed)
Beta Blockers   Reason not to administer Beta Blockers:Not Applicable 
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
## 2012-04-26 NOTE — Transfer of Care (Signed)
Immediate Anesthesia Transfer of Care Note  Patient: Alan Jennings  Procedure(s) Performed: Procedure(s) (LRB): OPEN REDUCTION INTERNAL FIXATION (ORIF) PROXIMAL HUMERUS FRACTURE (Left)  Patient Location: PACU  Anesthesia Type: General  Level of Consciousness: awake, alert , oriented and patient cooperative  Airway & Oxygen Therapy: Patient Spontanous Breathing and Patient connected to face mask oxygen  Post-op Assessment: Report given to PACU RN and Post -op Vital signs reviewed and stable  Post vital signs: Reviewed and stable  Complications: No apparent anesthesia complications

## 2012-04-26 NOTE — Anesthesia Preprocedure Evaluation (Signed)
Anesthesia Evaluation  Patient identified by MRN, date of birth, ID band Patient awake    Reviewed: Allergy & Precautions, H&P , NPO status , Patient's Chart, lab work & pertinent test results, reviewed documented beta blocker date and time   Airway Mallampati: I TM Distance: >3 FB     Dental  (+) Teeth Intact   Pulmonary former smoker breath sounds clear to auscultation        Cardiovascular hypertension, Pt. on medications Rhythm:Regular Rate:Normal     Neuro/Psych Seizures -, Well Controlled,  Anxiety Depression    GI/Hepatic GERD-  Medicated and Controlled,  Endo/Other    Renal/GU      Musculoskeletal   Abdominal   Peds  Hematology   Anesthesia Other Findings   Reproductive/Obstetrics                           Anesthesia Physical Anesthesia Plan  ASA: II  Anesthesia Plan: General   Post-op Pain Management:    Induction: Intravenous, Rapid sequence and Cricoid pressure planned  Airway Management Planned: Oral ETT  Additional Equipment:   Intra-op Plan:   Post-operative Plan: Extubation in OR  Informed Consent: I have reviewed the patients History and Physical, chart, labs and discussed the procedure including the risks, benefits and alternatives for the proposed anesthesia with the patient or authorized representative who has indicated his/her understanding and acceptance.     Plan Discussed with:   Anesthesia Plan Comments:         Anesthesia Quick Evaluation

## 2012-04-27 MED ORDER — METHOCARBAMOL 500 MG PO TABS: 500.0000 mg | ORAL_TABLET | Freq: Four times a day (QID) | ORAL | Status: DC | PRN

## 2012-04-27 MED ORDER — OXYCODONE-ACETAMINOPHEN 10-325 MG PO TABS: 1.0000 | ORAL_TABLET | ORAL | Status: DC | PRN

## 2012-04-27 MED ORDER — IBUPROFEN 800 MG PO TABS: 800.0000 mg | ORAL_TABLET | Freq: Three times a day (TID) | ORAL | Status: AC | PRN

## 2012-04-27 NOTE — Evaluation (Signed)
Occupational Therapy Evaluation Patient Details Name: Alan Jennings MRN: [id number] DOB: [birth year] Today's Date: 04/27/2012 Time: 4098-1191 OT Time Calculation (min): 17 min  OT Assessment / Plan / Recommendation Clinical Impression  A:  31 year old male presents s/p surgery to left humerus ORIF and bone graft.  Patient with decreased use of dominant LUE due to surgery.      OT Assessment  All further OT needs can be met in the next venue of care    Follow Up Recommendations  Outpatient OT    Barriers to Discharge      Equipment Recommendations  None recommended by OT    Recommendations for Other Services    Frequency       Precautions / Restrictions Precautions Precautions: Shoulder Type of Shoulder Precautions: Sling at all times.  PROM to begin in 2 weeks. Restrictions Weight Bearing Restrictions: No   Pertinent Vitals/Pain 5/10 pain in his left shoulder region.     ADL  Eating/Feeding: Set up ADL Comments: Discussed technique to don clothing on left arm first, then right and remove clothing from right arm then left for increased I and comfort.  Discussed technique of donning and doffing sling.    OT Diagnosis: Acute pain  OT Problem List: Decreased range of motion;Decreased strength;Pain OT Treatment Interventions:     OT Goals Acute Rehab OT Goals OT Goal Formulation: With patient Time For Goal Achievement: 04/28/12 Potential to Achieve Goals: Good Miscellaneous OT Goals Miscellaneous OT Goal #1: Patient will be educated on HEP, ADL techniques, and sling use.   OT Goal: Miscellaneous Goal #1 - Progress: Met  Visit Information  Last OT Received On: 04/27/12    Subjective Data  Subjective: S:  I broke my arm about a year ago.  I want to be able to use it again. Patient Stated Goal: Use my left arm again   Prior Functioning  Home Living Lives With: Family Type of Home: House Prior Function Level of Independence: Independent Able to Take Stairs?:  Yes Driving: Yes Vocation: On disability Communication Communication: No difficulties Dominant Hand: Left    Cognition  Overall Cognitive Status: Appears within functional limits for tasks assessed/performed Arousal/Alertness: Awake/alert Orientation Level: Appears intact for tasks assessed Behavior During Session: [facility] for tasks performed    Extremity/Trunk Assessment Right Upper Extremity Assessment RUE ROM/Strength/Tone: Within functional levels RUE Sensation: WFL - Light Touch RUE Coordination: WFL - gross/fine motor Left Upper Extremity Assessment LUE ROM/Strength/Tone: Deficits LUE ROM/Strength/Tone Deficits: Left shoulder not assessed due to recent surgery.  Elbow-hand A/PROM is [facility] for ADLs. LUE Sensation: WFL - Light Touch LUE Coordination: WFL - gross/fine motor   Mobility     Exercise    Balance    End of Session OT - End of Session Activity Tolerance: Patient tolerated treatment well Patient left: in bed   Shirlean Mylar, OTR/L  04/27/2012, 8:42 AM

## 2012-04-27 NOTE — Progress Notes (Signed)
UR chart review completed. Alan Jennings  

## 2012-04-27 NOTE — Progress Notes (Signed)
Staples removed, bandaids applied to  To sites, discharge instructions given to pt. And pt.ls mother. Pt. Taken to car via w/c

## 2012-04-27 NOTE — Discharge Summary (Signed)
Physician Discharge Summary  Patient ID: Alan Jennings MRN: [id number] DOB/AGE: 1981/06/02 30 y.o.  Admit date: 04/26/2012 Discharge date: 04/27/2012  Admission Diagnoses: LEFT HUMERUS MALUNITED FRACTURE   Discharge Diagnoses: SAME  Active Problems:  * No active hospital problems. *    Discharged Condition: good  Hospital Course: UNREMARKABLE   Consults: None  Significant Diagnostic Studies: NONE   Treatments: surgery: OSTEOTOMY , ORIF AND BONE GRAFT LEFT PROXIMAL HUMERUS MAL UNION   Discharge Exam: Blood pressure 136/82, pulse 78, temperature 97.9 F (36.6 C), temperature source Oral, resp. rate 18, SpO2 100.00%. General appearance: alert, cooperative, appears stated age and no distress Neurologic: Sensory: normal Motor: grossly normal HAND   Disposition: 01-Home or Self Care  Discharge Orders    Future Appointments: Provider: Department: Dept Phone: Center:   05/11/2012 9:15 AM Vickki Hearing, MD [facility] [phone] [facility]     Future Orders Please Complete By Expires   Diet - low sodium heart healthy      Increase activity slowly      Discharge instructions      Comments:   KEEP ARM IN SLING  MOVE HAND AND FINGER AS TOLERATED AND FOR EXERCISES - 10 X PER HOUR   Change dressing (specify)      Comments:   Dressing change: AS NEEDED   Call MD for:  temperature >100.4      Call MD for:  persistant nausea and vomiting      Call MD for:  severe uncontrolled pain      Call MD for:  redness, tenderness, or signs of infection (pain, swelling, redness, odor or green/yellow discharge around incision site)        Medication List  As of 04/27/2012  8:02 AM   STOP taking these medications         HYDROcodone-acetaminophen 7.5-325 MG per tablet         TAKE these medications         atenolol-chlorthalidone 50-25 MG per tablet   Commonly known as: TENORETIC   Take 1 tablet by mouth every morning.      ibuprofen 800 MG tablet   Commonly known as:  ADVIL,MOTRIN   Take 1 tablet (800 mg total) by mouth every 8 (eight) hours as needed for pain.      methocarbamol 500 MG tablet   Commonly known as: ROBAXIN   Take 1 tablet (500 mg total) by mouth every 6 (six) hours as needed.      oxyCODONE-acetaminophen 10-325 MG per tablet   Commonly known as: PERCOCET   Take 1 tablet by mouth every 4 (four) hours as needed for pain.      phenytoin 100 MG ER capsule   Commonly known as: DILANTIN   Take 100 mg by mouth 2 (two) times daily.           Follow-up Information    Follow up with Fuller Canada, MD on 05/11/2012.   Contact information:   [address] [place] [address] [phone]          Signed: Fuller Canada 04/27/2012, 8:02 AM

## 2012-04-27 NOTE — Care Management Note (Signed)
    Page 1 of 1   04/27/2012     8:59:36 AM   CARE MANAGEMENT NOTE 04/27/2012  Patient:  Alan Jennings, Alan Jennings   Account Number:  [id number]  Date Initiated:  04/27/2012  Documentation initiated by:  Sharrie Rothman  Subjective/Objective Assessment:   Pt admitted from home with fracture of the humerus. Pt s/p ORIF of left humerus. Pt lives with parents and is independent PTA with ADL's.     Action/Plan:   Pts surgeon will arrange PT as outpatient. No CM needs noted.   Anticipated DC Date:  05/03/2012   Anticipated DC Plan:  HOME/SELF CARE      DC Planning Services  CM consult      Choice offered to / List presented to:             Status of service:  Completed, signed off Medicare Important Message given?   (If response is "NO", the following Medicare IM given date fields will be blank) Date Medicare IM given:   Date Additional Medicare IM given:    Discharge Disposition:  HOME/SELF CARE  Per UR Regulation:    If discussed at Long Length of Stay Meetings, dates discussed:    Comments:  04/27/12 0858 Arlyss Queen, RN BSN CM

## 2012-04-28 ENCOUNTER — Encounter (HOSPITAL_COMMUNITY): Payer: Self-pay | Admitting: Orthopedic Surgery

## 2012-05-03 ENCOUNTER — Telehealth: Payer: Self-pay | Admitting: Orthopedic Surgery

## 2012-05-03 ENCOUNTER — Emergency Department (HOSPITAL_COMMUNITY): Payer: Medicaid Other

## 2012-05-03 ENCOUNTER — Encounter: Payer: Self-pay | Admitting: Orthopedic Surgery

## 2012-05-03 ENCOUNTER — Ambulatory Visit: Payer: Medicaid Other | Admitting: Orthopedic Surgery

## 2012-05-03 ENCOUNTER — Emergency Department (HOSPITAL_COMMUNITY)
Admission: EM | Admit: 2012-05-03 | Discharge: 2012-05-03 | Disposition: A | Discharge status: Home / Self Care | Payer: Medicaid Other | Attending: Emergency Medicine | Admitting: Emergency Medicine

## 2012-05-03 ENCOUNTER — Ambulatory Visit (INDEPENDENT_AMBULATORY_CARE_PROVIDER_SITE_OTHER): Payer: Medicaid Other | Admitting: Orthopedic Surgery

## 2012-05-03 ENCOUNTER — Encounter (HOSPITAL_COMMUNITY): Payer: Self-pay | Admitting: Emergency Medicine

## 2012-05-03 VITALS — BP 150/70 | Ht 69.0 in | Wt 176.0 lb

## 2012-05-03 DIAGNOSIS — Z87891 Personal history of nicotine dependence: Secondary | ICD-10-CM | POA: Insufficient documentation

## 2012-05-03 DIAGNOSIS — S42209A Unspecified fracture of upper end of unspecified humerus, initial encounter for closed fracture: Secondary | ICD-10-CM

## 2012-05-03 DIAGNOSIS — M79609 Pain in unspecified limb: Secondary | ICD-10-CM | POA: Insufficient documentation

## 2012-05-03 DIAGNOSIS — I10 Essential (primary) hypertension: Secondary | ICD-10-CM | POA: Insufficient documentation

## 2012-05-03 DIAGNOSIS — M25519 Pain in unspecified shoulder: Secondary | ICD-10-CM | POA: Insufficient documentation

## 2012-05-03 DIAGNOSIS — S42202A Unspecified fracture of upper end of left humerus, initial encounter for closed fracture: Secondary | ICD-10-CM

## 2012-05-03 DIAGNOSIS — M25439 Effusion, unspecified wrist: Secondary | ICD-10-CM | POA: Insufficient documentation

## 2012-05-03 DIAGNOSIS — M25512 Pain in left shoulder: Secondary | ICD-10-CM

## 2012-05-03 DIAGNOSIS — R569 Unspecified convulsions: Secondary | ICD-10-CM | POA: Insufficient documentation

## 2012-05-03 DIAGNOSIS — R509 Fever, unspecified: Secondary | ICD-10-CM | POA: Insufficient documentation

## 2012-05-03 LAB — DIFFERENTIAL
Eosinophils Absolute: 0 10*3/uL (ref 0.0–0.7)
Lymphs Abs: 1.6 10*3/uL (ref 0.7–4.0)
Neutrophils Relative %: 76 % (ref 43–77)

## 2012-05-03 LAB — CBC
MCH: 33.3 pg (ref 26.0–34.0)
Platelets: 368 10*3/uL (ref 150–400)
RBC: 3.78 MIL/uL — ABNORMAL LOW (ref 4.22–5.81)
WBC: 8.5 10*3/uL (ref 4.0–10.5)

## 2012-05-03 LAB — BASIC METABOLIC PANEL
GFR calc Af Amer: >90 mL/min (ref >90)
GFR calc non Af Amer: >90 mL/min (ref >90)
Glucose, Bld: 106 mg/dL — ABNORMAL HIGH (ref 70–99)
Potassium: 3.2 mEq/L — ABNORMAL LOW (ref 3.5–5.1)
Sodium: 138 mEq/L (ref 135–145)

## 2012-05-03 MED ORDER — SODIUM CHLORIDE 0.9 % IV BOLUS (SEPSIS)
1000.0000 mL | Freq: Once | INTRAVENOUS | Status: AC
  Administered 2012-05-03: 1000 mL via INTRAVENOUS

## 2012-05-03 MED ORDER — HYDROMORPHONE HCL PF 1 MG/ML IJ SOLN
1.0000 mg | Freq: Once | INTRAMUSCULAR | Status: AC
  Administered 2012-05-03: 1 mg via INTRAVENOUS
  Filled 2012-05-03: qty 1

## 2012-05-03 MED ORDER — DIAZEPAM 5 MG PO TABS: 5.0000 mg | ORAL_TABLET | Freq: Four times a day (QID) | ORAL | Status: DC

## 2012-05-03 MED ORDER — KETOROLAC TROMETHAMINE 30 MG/ML IJ SOLN
30.0000 mg | Freq: Once | INTRAMUSCULAR | Status: AC
  Administered 2012-05-03: 30 mg via INTRAVENOUS
  Filled 2012-05-03 (×2): qty 1

## 2012-05-03 MED ORDER — SODIUM CHLORIDE 0.9 % IV SOLN: INTRAVENOUS | Status: DC

## 2012-05-03 MED ORDER — VANCOMYCIN HCL IN DEXTROSE 1-5 GM/200ML-% IV SOLN
1000.0000 mg | Freq: Once | INTRAVENOUS | Status: AC
  Administered 2012-05-03: 1000 mg via INTRAVENOUS
  Filled 2012-05-03: qty 200

## 2012-05-03 MED ORDER — ONDANSETRON HCL 4 MG/2ML IJ SOLN
4.0000 mg | Freq: Once | INTRAMUSCULAR | Status: AC
  Administered 2012-05-03: 4 mg via INTRAVENOUS
  Filled 2012-05-03: qty 2

## 2012-05-03 NOTE — Patient Instructions (Signed)
New med  Remember to stretch arm out three times daily

## 2012-05-03 NOTE — ED Notes (Signed)
Pt had plate put in L shoulder Monday may 13th. States arms has been burning x 3 days. Swelling noted from shoulder to wrist. Radial pulses strong. L arm slightly more warm than R. Pt states has woken up in sweats last 3 nights due to pain. Denies sob. Able to feel touch.

## 2012-05-03 NOTE — Discharge Instructions (Signed)
I just spoke with Dr. Romeo Apple. He wants to see you in his office at 2:00. Go directly to his office now

## 2012-05-03 NOTE — Telephone Encounter (Signed)
Alan Jennings called this morning to request an appointment, said his incision is infected and he is running a fever and just feels really bad. Gave him an appointment for this afternoon, as Dr. Romeo Apple is in surgery this morning.  He called back and said he feels so bad he is just going to the ER this morning.

## 2012-05-03 NOTE — ED Notes (Signed)
Pt stable at discharge Family to pick him up; pt instructed not to drive

## 2012-05-03 NOTE — Progress Notes (Signed)
Patient ID: Alan Jennings, male   DOB: 1981-10-16, 31 y.o.   MRN: [id number] Chief Complaint  Patient presents with  . Wound Check    post op 1 left humerus    BP 150/70  Ht 5\' 9"  (1.753 m)  Wt 176 lb (79.833 kg)  BMI 25.99 kg/m2  The patient came in today for an unscheduled visit after going to the emergency room with increased pain in his left upper extremity. He is complaining of pain along his biceps tendon.  I encouraged the patient to straighten his arm and after initial reluctance he said he felt much better with his arm straight. X-rays were taken at the hospital and showed no acute changes.  He has normal range of motion and function of his upper extremity in terms of the hand and wrist painful range of motion in the elbow. The incision is clean dry and intact he has minimal swelling.  He does have some warmth to the shoulder and some evidence of subcutaneous hematoma which accounts for his fever but is not have a lot of swelling.  Impression biceps spasm recommend switch Robaxin to Valium for the next week continue straining his arm 3 times a day and followup as scheduled on the 28th for staple removal and x-ray

## 2012-05-03 NOTE — ED Provider Notes (Addendum)
History   This chart was scribed for Donnetta Hutching, MD by Clarita Crane. The patient was seen in room APA08/APA08. Patient's care was started at 0936.    CSN: [id number]  Arrival date & time 05/03/12  9147   First MD Initiated Contact with Patient 05/03/12 1014      Chief Complaint  Patient presents with  . Shoulder Pain    (Consider location/radiation/quality/duration/timing/severity/associated sxs/prior treatment) HPI Alan Jennings is a 31 y.o. male who presents to the Emergency Department complaining of constant severe pain to LUE described as burning onset 3 days ago and gradually worsening since with associated onset of fever measured as high as 101 2 days ago. Patient reports having surgical repair of left humerus performed by Dr. Romeo Apple 1 week ago and reports increased pain to LUE along suture line of surgical scar. Denies numbness, tingling, chest pain, SOB. Patient with h/o seizures, HTN, ORIF humerus fracture.  Past Medical History  Diagnosis Date  . Anxiety   . Seizures   . Depression   . Seizures   . Hypertension   . Complication of anesthesia     had a syncopal episode after rhinoplasty    Past Surgical History  Procedure Date  . Nose surgery     [facility]  . Rhinoplasty     [facility]  . Orif humerus fracture 04/26/2012    Procedure: OPEN REDUCTION INTERNAL FIXATION (ORIF) PROXIMAL HUMERUS FRACTURE;  Surgeon: Vickki Hearing, MD;  Location: [facility];  Service: Orthopedics;  Laterality: Left;  Open Reduction Internal Fixation of Left Proximal Humerus Fracture, Closing Wedge Osteotomy, Bone Graft  . Shoulder surgery     Family History  Problem Relation Age of Onset  . Heart disease    . Arthritis    . Anesthesia problems Neg Hx   . Hypotension Neg Hx   . Malignant hyperthermia Neg Hx   . Pseudochol deficiency Neg Hx     History  Substance Use Topics  . Smoking status: Former Smoker -- 0.5 packs/day for 7 years    Types: Cigarettes    Quit date: 09/19/2011  .  Smokeless tobacco: Never Used  . Alcohol Use: Yes     weekend beer      Review of Systems A complete 10 system review of systems was obtained and all systems are negative except as noted in the HPI and PMH.   Allergies  Tramadol  Home Medications   Current Outpatient Rx  Name Route Sig Dispense Refill  . ATENOLOL-CHLORTHALIDONE 50-25 MG PO TABS Oral Take 1 tablet by mouth every morning.     . IBUPROFEN 800 MG PO TABS Oral Take 1 tablet (800 mg total) by mouth every 8 (eight) hours as needed for pain. 90 tablet 5  . METHOCARBAMOL 500 MG PO TABS Oral Take 500 mg by mouth every 6 (six) hours as needed. For muscle relaxation    . FISH OIL 1000 MG PO CAPS Oral Take 1,000 mg by mouth 2 (two) times daily.    . OXYCODONE-ACETAMINOPHEN 10-325 MG PO TABS Oral Take 1 tablet by mouth every 4 (four) hours as needed. For pain    . PHENYTOIN SODIUM EXTENDED 100 MG PO CAPS Oral Take 100 mg by mouth 2 (two) times daily.       BP 150/81  Pulse 72  Temp(Src) 98.4 F (36.9 C) (Oral)  Resp 17  Ht 5\' 9"  (1.753 m)  Wt 176 lb (79.833 kg)  BMI 25.99 kg/m2  SpO2 98%  Physical Exam  Nursing note and vitals reviewed. Constitutional: He is oriented to person, place, and time. He appears well-developed and well-nourished.       Uncomfortable appearing.   HENT:  Head: Normocephalic and atraumatic.  Eyes: EOM are normal. Pupils are equal, round, and reactive to light.  Neck: Neck supple. No tracheal deviation present.  Cardiovascular: Normal rate.   Pulmonary/Chest: Effort normal. No respiratory distress.  Abdominal: Soft. He exhibits no distension.  Musculoskeletal: He exhibits tenderness.       20cm surgical scar to lateral aspect of proximal humerus with suture line intact but displays tenderness about suture line. Swelling of left forearm.   Neurological: He is alert and oriented to person, place, and time. No sensory deficit.  Skin: Skin is warm and dry.  Psychiatric: He has a normal mood and  affect. His behavior is normal.    ED Course  Procedures (including critical care time)  DIAGNOSTIC STUDIES: Oxygen Saturation is 98% on room air, normal by my interpretation.    COORDINATION OF CARE: 10:30AM-Patient informed of current plan for treatment and evaluation and agrees with plan at this time.    Results for orders placed during the hospital encounter of 05/03/12  CBC      Component Value Range   WBC 8.5  4.0 - 10.5 (K/uL)   RBC 3.78 (*) 4.22 - 5.81 (MIL/uL)   Hemoglobin 12.6 (*) 13.0 - 17.0 (g/dL)   HCT 16.1 (*) 09.6 - 52.0 (%)   MCV 98.4  78.0 - 100.0 (fL)   MCH 33.3  26.0 - 34.0 (pg)   MCHC 33.9  30.0 - 36.0 (g/dL)   RDW 04.5  40.9 - 81.1 (%)   Platelets 368  150 - 400 (K/uL)  DIFFERENTIAL      Component Value Range   Neutrophils Relative 76  43 - 77 (%)   Neutro Abs 6.4  1.7 - 7.7 (K/uL)   Lymphocytes Relative 19  12 - 46 (%)   Lymphs Abs 1.6  0.7 - 4.0 (K/uL)   Monocytes Relative 5  3 - 12 (%)   Monocytes Absolute 0.4  0.1 - 1.0 (K/uL)   Eosinophils Relative 0  0 - 5 (%)   Eosinophils Absolute 0.0  0.0 - 0.7 (K/uL)   Basophils Relative 0  0 - 1 (%)   Basophils Absolute 0.0  0.0 - 0.1 (K/uL)  BASIC METABOLIC PANEL      Component Value Range   Sodium 138  135 - 145 (mEq/L)   Potassium 3.2 (*) 3.5 - 5.1 (mEq/L)   Chloride 96  96 - 112 (mEq/L)   CO2 28  19 - 32 (mEq/L)   Glucose, Bld 106 (*) 70 - 99 (mg/dL)   BUN 5 (*) 6 - 23 (mg/dL)   Creatinine, Ser 9.14  0.50 - 1.35 (mg/dL)   Calcium 78.2  8.4 - 10.5 (mg/dL)   GFR calc non Af Amer >90  >90 (mL/min)   GFR calc Af Amer >90  >90 (mL/min)    Dg Humerus Left  05/03/2012  *RADIOLOGY REPORT*  Clinical Data: Pain and burning in arm, ORIF left humerus a week ago, fever  LEFT HUMERUS - 2+ VIEW  Comparison: 04/26/2012  Findings: Plate with multiple screws identified at proximal left humerus across a fracture at the surgical neck of the left humerus. Minimal calcific debris at surgical site. No acute fracture or  dislocation. AC joint alignment normal. Skin clips and tiny foci of soft tissue gas at  surgical site. Question soft tissue swelling left upper arm. No definite periprosthetic lucency.  IMPRESSION: Postsurgical changes of the proximal left humerus. Questionable soft tissue swelling. No definite acute osseous abnormalities.  Original Report Authenticated By: Lollie Marrow, M.D.     No diagnosis found.    MDM  X-ray shows no acute changes.  Surgical wound does not look infected. Discussed case with Dr. Romeo Apple. Will see patient in the office now.      I personally performed the services described in this documentation, which was scribed in my presence. The recorded information has been reviewed and considered.    Donnetta Hutching, MD 05/03/12 1334  Donnetta Hutching, MD 05/03/12 1335

## 2012-05-03 NOTE — Telephone Encounter (Signed)
Alan Jennings wanted you to know he took one of the Valium you wrote the prescription for today and his bicep and the knot on his arm is a lot more relaxed.  Feels much better.

## 2012-05-11 ENCOUNTER — Ambulatory Visit (INDEPENDENT_AMBULATORY_CARE_PROVIDER_SITE_OTHER): Payer: Medicaid Other | Admitting: Orthopedic Surgery

## 2012-05-11 ENCOUNTER — Encounter: Payer: Self-pay | Admitting: Orthopedic Surgery

## 2012-05-11 ENCOUNTER — Ambulatory Visit (INDEPENDENT_AMBULATORY_CARE_PROVIDER_SITE_OTHER): Payer: Medicaid Other

## 2012-05-11 VITALS — BP 120/80 | Ht 69.0 in | Wt 176.0 lb

## 2012-05-11 DIAGNOSIS — Z9889 Other specified postprocedural states: Secondary | ICD-10-CM | POA: Insufficient documentation

## 2012-05-11 MED ORDER — OXYCODONE-ACETAMINOPHEN 7.5-325 MG PO TABS: 1.0000 | ORAL_TABLET | ORAL | Status: DC | PRN

## 2012-05-11 MED ORDER — DIAZEPAM 5 MG PO TABS: 5.0000 mg | ORAL_TABLET | Freq: Four times a day (QID) | ORAL | Status: AC

## 2012-05-11 NOTE — Progress Notes (Signed)
Patient ID: Alan Jennings, male   DOB: [birth year], 31 y.o.   MRN: [id number] Chief Complaint  Patient presents with  . Shoulder Problem    Postoperative visit letter shoulder surgery May 13 open treatment internal fixation and osteotomy with bone graft   BP 120/80  Ht 5\' 9"  (1.753 m)  Wt 176 lb (79.833 kg)  BMI 25.99 kg/m2  Patient has improved significantly in terms of his anterior arm pain which was later biceps spasm from constant elbow flexion  He is sleeping now. He has minimal discomfort.  Incision is clean dry and intact. Staples can be removed today. X-ray will be taken  X-ray was taken and it shows adequate position of the plate and screw construct.  Recommend continue sling use for the next 4 weeks with frequent and intermittent intervals of elbow flexion extension to relax the biceps. Continue Valium and Percocet for pain which is reduced to 7-1/2 mg

## 2012-05-11 NOTE — Patient Instructions (Signed)
Sling continue   Continue arm exercises

## 2012-05-26 ENCOUNTER — Encounter: Payer: Self-pay | Admitting: Orthopedic Surgery

## 2012-05-26 ENCOUNTER — Ambulatory Visit (INDEPENDENT_AMBULATORY_CARE_PROVIDER_SITE_OTHER): Payer: Medicaid Other | Admitting: Orthopedic Surgery

## 2012-05-26 VITALS — BP 120/90 | Ht 69.0 in | Wt 176.0 lb

## 2012-05-26 DIAGNOSIS — T8131XA Disruption of external operation (surgical) wound, not elsewhere classified, initial encounter: Secondary | ICD-10-CM

## 2012-05-26 DIAGNOSIS — Z9889 Other specified postprocedural states: Secondary | ICD-10-CM

## 2012-05-26 MED ORDER — DOXYCYCLINE HYCLATE 50 MG PO CAPS: 100.0000 mg | ORAL_CAPSULE | Freq: Two times a day (BID) | ORAL | Status: AC

## 2012-05-26 MED ORDER — OXYCODONE-ACETAMINOPHEN 7.5-325 MG PO TABS: 1.0000 | ORAL_TABLET | ORAL | Status: DC | PRN

## 2012-05-26 NOTE — Progress Notes (Signed)
Patient ID: Alan Jennings, male   DOB: 1981-05-25, 31 y.o.   MRN: [id number] Chief Complaint  Patient presents with  . Follow-up    2 week recheck left shoulder, DOS 04/26/12    BP 120/90  Ht 5\' 9"  (1.753 m)  Wt 79.833 kg (176 lb)  BMI 25.99 kg/m2  #4 status post osteotomy, LEFT proximal humerus with internal fixation and bone graft.  Patient came in for medication refills. His muscle spasm is resolving on Valium,  He's developed a superficial wound issue, which looks like a stitch abscess, which was debrided lightly and packed with a wet-to-dry dressing.  He will start on doxycycline 100 mg twice a day for the next 2 weeks with one refill. He is also continuing on Percocet 7.5 mg, which is controlling his pain. He will [facility] for wet to dry dressings.  When he comes back in 2 weeks. He will be for 6 week postop. X-ray

## 2012-05-26 NOTE — Patient Instructions (Addendum)
CONTINUE SLING AS NEEDED AND  ARM EXERCISES   Start antibiotics   We will call [facility] for wound care

## 2012-06-02 ENCOUNTER — Encounter (HOSPITAL_COMMUNITY): Payer: Self-pay | Admitting: *Deleted

## 2012-06-02 ENCOUNTER — Inpatient Hospital Stay (HOSPITAL_COMMUNITY)
Admission: AD | Admit: 2012-06-02 | Discharge: 2012-06-08 | DRG: 863 | Disposition: A | Discharge status: Home / Self Care | Payer: Medicaid Other | Source: Ambulatory Visit | Attending: Orthopedic Surgery | Admitting: Orthopedic Surgery

## 2012-06-02 ENCOUNTER — Emergency Department (HOSPITAL_COMMUNITY)
Admission: EM | Admit: 2012-06-02 | Discharge: 2012-06-02 | Disposition: A | Discharge status: Home / Self Care | Payer: Medicaid Other | Attending: Emergency Medicine | Admitting: Emergency Medicine

## 2012-06-02 ENCOUNTER — Ambulatory Visit (INDEPENDENT_AMBULATORY_CARE_PROVIDER_SITE_OTHER): Payer: Medicaid Other

## 2012-06-02 ENCOUNTER — Ambulatory Visit (INDEPENDENT_AMBULATORY_CARE_PROVIDER_SITE_OTHER): Payer: Medicaid Other | Admitting: Orthopedic Surgery

## 2012-06-02 ENCOUNTER — Encounter (HOSPITAL_COMMUNITY): Payer: Self-pay

## 2012-06-02 VITALS — Ht 69.0 in | Wt 176.0 lb

## 2012-06-02 DIAGNOSIS — Z87891 Personal history of nicotine dependence: Secondary | ICD-10-CM | POA: Insufficient documentation

## 2012-06-02 DIAGNOSIS — M25512 Pain in left shoulder: Secondary | ICD-10-CM

## 2012-06-02 DIAGNOSIS — F411 Generalized anxiety disorder: Secondary | ICD-10-CM | POA: Diagnosis present

## 2012-06-02 DIAGNOSIS — M79603 Pain in arm, unspecified: Secondary | ICD-10-CM

## 2012-06-02 DIAGNOSIS — F329 Major depressive disorder, single episode, unspecified: Secondary | ICD-10-CM | POA: Diagnosis present

## 2012-06-02 DIAGNOSIS — M25519 Pain in unspecified shoulder: Secondary | ICD-10-CM

## 2012-06-02 DIAGNOSIS — E876 Hypokalemia: Secondary | ICD-10-CM | POA: Diagnosis present

## 2012-06-02 DIAGNOSIS — F3289 Other specified depressive episodes: Secondary | ICD-10-CM | POA: Diagnosis present

## 2012-06-02 DIAGNOSIS — M79609 Pain in unspecified limb: Secondary | ICD-10-CM | POA: Insufficient documentation

## 2012-06-02 DIAGNOSIS — Y838 Other surgical procedures as the cause of abnormal reaction of the patient, or of later complication, without mention of misadventure at the time of the procedure: Secondary | ICD-10-CM | POA: Diagnosis present

## 2012-06-02 DIAGNOSIS — T8149XA Infection following a procedure, other surgical site, initial encounter: Secondary | ICD-10-CM | POA: Diagnosis present

## 2012-06-02 DIAGNOSIS — T8140XA Infection following a procedure, unspecified, initial encounter: Principal | ICD-10-CM | POA: Diagnosis present

## 2012-06-02 DIAGNOSIS — I10 Essential (primary) hypertension: Secondary | ICD-10-CM | POA: Diagnosis present

## 2012-06-02 DIAGNOSIS — T8131XA Disruption of external operation (surgical) wound, not elsewhere classified, initial encounter: Secondary | ICD-10-CM

## 2012-06-02 DIAGNOSIS — Z9889 Other specified postprocedural states: Secondary | ICD-10-CM

## 2012-06-02 LAB — CBC
HCT: 45.1 % (ref 39.0–52.0)
MCHC: 34.6 g/dL (ref 30.0–36.0)
MCV: 91.3 fL (ref 78.0–100.0)
RDW: 12.5 % (ref 11.5–15.5)
WBC: 12.5 10*3/uL — ABNORMAL HIGH (ref 4.0–10.5)

## 2012-06-02 LAB — DIFFERENTIAL
Basophils Absolute: 0 10*3/uL (ref 0.0–0.1)
Eosinophils Relative: 0 % (ref 0–5)
Lymphocytes Relative: 10 % — ABNORMAL LOW (ref 12–46)
Monocytes Absolute: 0.7 10*3/uL (ref 0.1–1.0)

## 2012-06-02 LAB — BASIC METABOLIC PANEL
BUN: 8 mg/dL (ref 6–23)
CO2: 26 mEq/L (ref 19–32)
Chloride: 91 mEq/L — ABNORMAL LOW (ref 96–112)
Creatinine, Ser: 0.67 mg/dL (ref 0.50–1.35)
Glucose, Bld: 92 mg/dL (ref 70–99)

## 2012-06-02 MED ORDER — PHENYTOIN SODIUM EXTENDED 100 MG PO CAPS
100.0000 mg | ORAL_CAPSULE | Freq: Every day | ORAL | Status: DC
  Administered 2012-06-03 – 2012-06-08 (×6): 100 mg via ORAL
  Filled 2012-06-02 (×6): qty 1

## 2012-06-02 MED ORDER — SODIUM CHLORIDE 0.9 % IV SOLN
INTRAVENOUS | Status: DC
  Administered 2012-06-02 – 2012-06-07 (×8): via INTRAVENOUS

## 2012-06-02 MED ORDER — PNEUMOCOCCAL VAC POLYVALENT 25 MCG/0.5ML IJ INJ
0.5000 mL | INJECTION | INTRAMUSCULAR | Status: AC
  Administered 2012-06-03: 0.5 mL via INTRAMUSCULAR
  Filled 2012-06-02: qty 0.5

## 2012-06-02 MED ORDER — OXYCODONE-ACETAMINOPHEN 7.5-325 MG PO TABS: 1.0000 | ORAL_TABLET | ORAL | Status: DC | PRN

## 2012-06-02 MED ORDER — OXYCODONE-ACETAMINOPHEN 5-325 MG PO TABS
1.5000 | ORAL_TABLET | ORAL | Status: DC | PRN
  Administered 2012-06-02 – 2012-06-08 (×23): 1.5 via ORAL
  Filled 2012-06-02 (×23): qty 2

## 2012-06-02 MED ORDER — VANCOMYCIN HCL IN DEXTROSE 1-5 GM/200ML-% IV SOLN
1000.0000 mg | Freq: Two times a day (BID) | INTRAVENOUS | Status: DC
  Administered 2012-06-02 – 2012-06-03 (×4): 1000 mg via INTRAVENOUS
  Filled 2012-06-02 (×11): qty 200

## 2012-06-02 MED ORDER — OMEGA-3-ACID ETHYL ESTERS 1 G PO CAPS
1.0000 g | ORAL_CAPSULE | Freq: Every day | ORAL | Status: DC
  Administered 2012-06-03 – 2012-06-08 (×6): 1 g via ORAL
  Filled 2012-06-02 (×6): qty 1

## 2012-06-02 MED ORDER — IBUPROFEN 800 MG PO TABS
800.0000 mg | ORAL_TABLET | Freq: Three times a day (TID) | ORAL | Status: DC
  Administered 2012-06-02 – 2012-06-08 (×18): 800 mg via ORAL
  Filled 2012-06-02 (×18): qty 1

## 2012-06-02 MED ORDER — CHLORTHALIDONE 25 MG PO TABS
25.0000 mg | ORAL_TABLET | Freq: Every day | ORAL | Status: DC
  Administered 2012-06-03 – 2012-06-08 (×6): 25 mg via ORAL
  Filled 2012-06-02 (×7): qty 1

## 2012-06-02 MED ORDER — HYDROMORPHONE HCL PF 1 MG/ML IJ SOLN
0.5000 mg | INTRAMUSCULAR | Status: DC | PRN
  Administered 2012-06-02 (×2): 1 mg via INTRAVENOUS
  Administered 2012-06-02: 0.5 mg via INTRAVENOUS
  Administered 2012-06-02 – 2012-06-04 (×11): 1 mg via INTRAVENOUS
  Filled 2012-06-02 (×14): qty 1

## 2012-06-02 MED ORDER — DIAZEPAM 5 MG PO TABS
10.0000 mg | ORAL_TABLET | Freq: Four times a day (QID) | ORAL | Status: DC
  Administered 2012-06-02 – 2012-06-08 (×24): 10 mg via ORAL
  Filled 2012-06-02 (×27): qty 2

## 2012-06-02 MED ORDER — OXYCODONE-ACETAMINOPHEN 5-325 MG PO TABS: 1.0000 | ORAL_TABLET | ORAL | Status: DC | PRN

## 2012-06-02 MED ORDER — ATENOLOL-CHLORTHALIDONE 50-25 MG PO TABS
1.0000 | ORAL_TABLET | Freq: Every morning | ORAL | Status: DC
  Filled 2012-06-02 (×2): qty 1

## 2012-06-02 MED ORDER — ONDANSETRON HCL 4 MG/2ML IJ SOLN: 4.0000 mg | Freq: Four times a day (QID) | INTRAMUSCULAR | Status: DC | PRN

## 2012-06-02 MED ORDER — ATENOLOL 25 MG PO TABS
50.0000 mg | ORAL_TABLET | Freq: Every day | ORAL | Status: DC
  Administered 2012-06-03 – 2012-06-08 (×6): 50 mg via ORAL
  Filled 2012-06-02 (×6): qty 2

## 2012-06-02 NOTE — Patient Instructions (Signed)
GO TO HOSPITAL FOR DIRECT ADMIT

## 2012-06-02 NOTE — ED Notes (Signed)
Pt had arm surgery to left upper arm May 13th.  Surgical site got infected.  Wound care nurse come to house Thursday last week and showed pt how to dress wound.  Pt says infection is getting worse.  Reports has been taking antibiotics  and changing dressings as directed.

## 2012-06-02 NOTE — Discharge Instructions (Signed)
Please go to dr harrison's office today

## 2012-06-02 NOTE — Progress Notes (Signed)
Patient ID: Alan Jennings, male   DOB: 11/30/1981, 31 y.o.   MRN: [id number] Chief Complaint  Patient presents with  . Follow-up    Wound check left shoulder.      The patient was sent from the ER. He presented there and so the office with increased pain for 2 days.  He was sent to the hospital for admission and workup for infection.  Radiographically, taken, and it shows no completing features or change in his hardware.  History to be dictated at the hospital.

## 2012-06-02 NOTE — ED Provider Notes (Signed)
History  This chart was scribed for Joya Gaskins, MD by Bennett Scrape. This patient was seen in room APA05/[facility]05 and the patient's care was started at 10:21AM.  CSN: [id number]  Arrival date & time 06/02/12  1009   First MD Initiated Contact with Patient 06/02/12 1021      Chief Complaint  Patient presents with  . Arm Pain    Patient is a 31 y.o. male presenting with arm pain. The history is provided by the patient. No language interpreter was used.  Arm Pain This is a new problem. The current episode started more than 2 days ago. The problem occurs constantly. The problem has been gradually worsening. Pertinent negatives include no abdominal pain.    MEHDI GIRONDA is a 31 y.o. male who presents to the Emergency Department complaining of one week of gradual onset, gradually worsening, costant surgical site infection on the left arm. He reports non-bloody emesis and intermittent mild fevers as associated symptoms. He denies drainage. The pain is worse with movement. Pt reports that he had an open reduction internal fixation on a humerus fracture performed by Dr. Romeo Apple on May 13th, 2013. He states that he called [facility] and was told to take care of it himself. He denies following up with [facility] since then. He states that he called Dr. Romeo Apple today but decided to come here when he didn't here back from Dr. Mort Sawyers office.   Past Medical History  Diagnosis Date  . Anxiety   . Seizures   . Depression       . Hypertension   . Complication of anesthesia     had a syncopal episode after rhinoplasty    Past Surgical History  Procedure Date  . Nose surgery     [facility]  . Rhinoplasty     [facility]  . Orif humerus fracture 04/26/2012    Procedure: OPEN REDUCTION INTERNAL FIXATION (ORIF) PROXIMAL HUMERUS FRACTURE;  Surgeon: Vickki Hearing, MD;  Location: [facility];  Service: Orthopedics;  Laterality: Left;  Open Reduction Internal Fixation of Left Proximal  Humerus Fracture, Closing Wedge Osteotomy, Bone Graft  . Shoulder surgery     Family History  Problem Relation Age of Onset  . Heart disease    . Arthritis    . Anesthesia problems Neg Hx   . Hypotension Neg Hx   . Malignant hyperthermia Neg Hx   . Pseudochol deficiency Neg Hx     History  Substance Use Topics  . Smoking status: Former Smoker -- 0.5 packs/day for 7 years    Types: Cigarettes    Quit date: 09/19/2011  . Smokeless tobacco: Never Used  . Alcohol Use: Yes     weekend beer      Review of Systems  Constitutional: Positive for fever. Negative for chills.  Gastrointestinal: Positive for vomiting. Negative for abdominal pain.  Skin: Positive for wound (Left arm surgical site infection).    Allergies  Tramadol  Home Medications   Current Outpatient Rx  Name Route Sig Dispense Refill  . ATENOLOL-CHLORTHALIDONE 50-25 MG PO TABS Oral Take 1 tablet by mouth every morning.     Marland Kitchen DOXYCYCLINE HYCLATE 50 MG PO CAPS Oral Take 2 capsules (100 mg total) by mouth 2 (two) times daily. 28 capsule 1  . FISH OIL 1000 MG PO CAPS Oral Take 1,000 mg by mouth 2 (two) times daily.    . OXYCODONE-ACETAMINOPHEN 7.5-325 MG PO TABS Oral Take 1 tablet by mouth every  4 (four) hours as needed for pain. 84 tablet 0  . PHENYTOIN SODIUM EXTENDED 100 MG PO CAPS Oral Take 100 mg by mouth 2 (two) times daily.       Triage Vitals: BP 152/88  Pulse 96  Temp 98.2 F (36.8 C) (Oral)  Resp 20  Ht 5\' 9"  (1.753 m)  Wt 176 lb (79.833 kg)  BMI 25.99 kg/m2  SpO2 99%  Physical Exam  Nursing note and vitals reviewed.  CONSTITUTIONAL: Well developed/well nourished HEAD AND FACE: Normocephalic/atraumatic EYES: EOMI ENMT: Mucous membranes moist NECK: supple no meningeal signs CV: S1/S2 noted, no murmurs/rubs/gallops noted LUNGS: Lungs are clear to auscultation bilaterally, no apparent distress ABDOMEN: soft, nontender, no rebound or guarding NEURO: Pt is awake/alert, moves all  extremitiesx4 EXTREMITIES: pulses normal, full ROM.  Well healed surgical wound to left humerus.  There is a small central area that has been packed that appears clean, no erythema/drainage noted.  No crepitance to wound SKIN: warm, color normal PSYCH: no abnormalities of mood noted  ED Course  Procedures   DIAGNOSTIC STUDIES: Oxygen Saturation is 99% on room air, normal by my interpretation.    COORDINATION OF CARE: 10:29AM-Discussed discharge plan and advised pt to follow up with Dr. Romeo Apple today. 10:35AM-Discussed pt's case with Dr. Romeo Apple and informed him that I am advising the pt to follow up with him today. Called ended at 10:38AM.   Dr Romeo Apple to see patient today in office.  Afebrile here, feel he is stable for dr Romeo Apple to see patient    MDM  Nursing notes including past medical history and social history reviewed and considered in documentation    I personally performed the services described in this documentation, which was scribed in my presence. The recorded information has been reviewed and considered.         Joya Gaskins, MD 06/02/12 1049

## 2012-06-03 DIAGNOSIS — M25512 Pain in left shoulder: Secondary | ICD-10-CM | POA: Diagnosis present

## 2012-06-03 DIAGNOSIS — T8149XA Infection following a procedure, other surgical site, initial encounter: Secondary | ICD-10-CM | POA: Diagnosis present

## 2012-06-03 DIAGNOSIS — E876 Hypokalemia: Secondary | ICD-10-CM | POA: Diagnosis present

## 2012-06-03 MED ORDER — POTASSIUM CHLORIDE CRYS ER 20 MEQ PO TBCR
20.0000 meq | EXTENDED_RELEASE_TABLET | Freq: Two times a day (BID) | ORAL | Status: DC
  Administered 2012-06-03 – 2012-06-08 (×11): 20 meq via ORAL
  Filled 2012-06-03 (×11): qty 1

## 2012-06-03 NOTE — Care Management Note (Signed)
    Page 1 of 2   06/08/2012     8:57:11 AM   CARE MANAGEMENT NOTE 06/08/2012  Patient:  Alan Jennings, Alan Jennings   Account Number:  [id number]  Date Initiated:  06/03/2012  Documentation initiated by:  Sharrie Rothman  Subjective/Objective Assessment:   Pt admitted from home with wound infection of left shoulder. Pt lives with father and will return home at discharge. Pt is currently active with [facility] for wound care management.     Action/Plan:   CM notified [facility] of admission. Will arrange resumption of care at discharge if needed. Will continue to monitor.   Anticipated DC Date:  06/05/2012   Anticipated DC Plan:  HOME W HOME HEALTH SERVICES      DC Planning Services  CM consult      [facility] Choice  HOME HEALTH   Choice offered to / List presented to:  [name]        HH arranged  HH-1 RN  IV Antibiotics      HH agency  [facility]   Status of service:  Completed, signed off Medicare Important Message given?   (If response is "NO", the following Medicare IM given date fields will be blank) Date Medicare IM given:   Date Additional Medicare IM given:    Discharge Disposition:  HOME W HOME HEALTH SERVICES  Per UR Regulation:    If discussed at Long Length of Stay Meetings, dates discussed:    Comments:  06/08/12 0840 Arlyss Queen, RN BSN CM Pt discharged home today with IV AB. Pt already active with [facility] RN and chooses to continue with them for IV AB. Pt stated that he would be the one to learn how to do the AB and his father would be there to learn also. Alroy Bailiff of [facility] is aware and will collect the pts information from the chart. No DME needs noted.  06/03/12 1140 Arlyss Queen, RN BSN CM

## 2012-06-03 NOTE — H&P (Addendum)
Alan Jennings is an 31 y.o. male.   Chief Complaint: left shoulder pain x 2 days  HPI: 31 year old male had a left proximal humerus fracture treated at [facility] he developed a malunion and was eventually treated with osteotomy open reduction internal fixation and bone grafting on 04/26/2012. (6 weeks ago). He did well until about one week ago when he developed drainage from the midportion of his wound. It was felt to be superficial he was placed on oral antibiotics and treated with wet-to-dry dressing changes. He presented to the emergency room yesterday complaining of increasing shoulder pain. He was evaluated in the office and other than increasing pain in his wound looked clean with a good granulation bed there is no surrounding erythema. He was afebrile. His x-ray showed that his osteotomy site was intact hardware was intact there was no signs of loosening. He was brought in to the hospital for increasing pain and poor presumptive wound infection.    Past Medical History  Diagnosis Date  . Anxiety   . Seizures   . Depression   . Seizures   . Hypertension   . Complication of anesthesia     had a syncopal episode after rhinoplasty    Past Surgical History  Procedure Date  . Nose surgery     [facility]  . Rhinoplasty     [facility]  . Orif humerus fracture 04/26/2012    Procedure: OPEN REDUCTION INTERNAL FIXATION (ORIF) PROXIMAL HUMERUS FRACTURE;  Surgeon: Vickki Hearing, MD;  Location: [facility];  Service: Orthopedics;  Laterality: Left;  Open Reduction Internal Fixation of Left Proximal Humerus Fracture, Closing Wedge Osteotomy, Bone Graft  . Shoulder surgery     Family History  Problem Relation Age of Onset  . Heart disease    . Arthritis    . Anesthesia problems Neg Hx   . Hypotension Neg Hx   . Malignant hyperthermia Neg Hx   . Pseudochol deficiency Neg Hx    Social History:  reports that he quit smoking about 8 months ago. His smoking use included Cigarettes. He has a 3.5  pack-year smoking history. He has never used smokeless tobacco. He reports that he drinks alcohol. He reports that he does not use illicit drugs.  Allergies:  Allergies  Allergen Reactions  . Tramadol Other (See Comments)    SEIZURES    Medications Prior to Admission  Medication Sig Dispense Refill  . atenolol-chlorthalidone (TENORETIC) 50-25 MG per tablet Take 1 tablet by mouth every morning.       Marland Kitchen doxycycline (VIBRAMYCIN) 50 MG capsule Take 2 capsules (100 mg total) by mouth 2 (two) times daily.  28 capsule  1  . Omega-3 Fatty Acids (FISH OIL) 1000 MG CAPS Take 1,000 mg by mouth 2 (two) times daily.      Marland Kitchen oxyCODONE-acetaminophen (PERCOCET) 7.5-325 MG per tablet Take 1 tablet by mouth every 4 (four) hours as needed for pain.  84 tablet  0  . phenytoin (DILANTIN) 100 MG ER capsule Take 100 mg by mouth daily.        Results for orders placed during the hospital encounter of 06/02/12 (from the past 48 hour(s))  BASIC METABOLIC PANEL     Status: Abnormal   Collection Time   06/02/12  1:36 PM      Component Value Range Comment   Sodium 134 (*) 135 - 145 mEq/L    Potassium 2.8 (*) 3.5 - 5.1 mEq/L    Chloride 91 (*) 96 -  112 mEq/L    CO2 26  19 - 32 mEq/L    Glucose, Bld 92  70 - 99 mg/dL    BUN 8  6 - 23 mg/dL    Creatinine, Ser 1.61  0.50 - 1.35 mg/dL    Calcium 09.6  8.4 - 10.5 mg/dL    GFR calc non Af Amer >90  >90 mL/min    GFR calc Af Amer >90  >90 mL/min   CBC     Status: Abnormal   Collection Time   06/02/12  1:36 PM      Component Value Range Comment   WBC 12.5 (*) 4.0 - 10.5 K/uL    RBC 4.94  4.22 - 5.81 MIL/uL    Hemoglobin 15.6  13.0 - 17.0 g/dL    HCT 04.5  40.9 - 81.1 %    MCV 91.3  78.0 - 100.0 fL    MCH 31.6  26.0 - 34.0 pg    MCHC 34.6  30.0 - 36.0 g/dL    RDW 91.4  78.2 - 95.6 %    Platelets 272  150 - 400 K/uL   DIFFERENTIAL     Status: Abnormal   Collection Time   06/02/12  1:36 PM      Component Value Range Comment   Neutrophils Relative 84 (*) 43 - 77  %    Neutro Abs 10.5 (*) 1.7 - 7.7 K/uL    Lymphocytes Relative 10 (*) 12 - 46 %    Lymphs Abs 1.3  0.7 - 4.0 K/uL    Monocytes Relative 6  3 - 12 %    Monocytes Absolute 0.7  0.1 - 1.0 K/uL    Eosinophils Relative 0  0 - 5 %    Eosinophils Absolute 0.0  0.0 - 0.7 K/uL    Basophils Relative 0  0 - 1 %    Basophils Absolute 0.0  0.0 - 0.1 K/uL   SEDIMENTATION RATE     Status: Abnormal   Collection Time   06/02/12  1:36 PM      Component Value Range Comment   Sed Rate 24 (*) 0 - 16 mm/hr    No results found.  Review of Systems  Constitutional: Positive for malaise/fatigue. Negative for fever, chills, weight loss and diaphoresis.  Eyes: Negative for blurred vision.  Respiratory: Negative for cough.   Cardiovascular: Negative for chest pain.  Gastrointestinal: Negative for heartburn.  Genitourinary: Negative for dysuria.  Musculoskeletal: Positive for joint pain. Negative for myalgias.  Skin: Negative for itching and rash.  Neurological: Positive for tingling. Negative for dizziness, weakness and headaches.       Complaint of occasional numbness in his left upper extremity over the anterior deltoid  Endo/Heme/Allergies: Negative for environmental allergies and polydipsia. Does not bruise/bleed easily.  Psychiatric/Behavioral: Positive for depression. Negative for suicidal ideas.    Blood pressure 134/84, pulse 78, temperature 97.7 F (36.5 C), temperature source Oral, resp. rate 18, height 5\' 8"  (1.727 m), weight 79.8 kg (175 lb 14.8 oz), SpO2 100.00%. Physical Exam  Constitutional: He is oriented to person, place, and time. He appears well-developed and well-nourished.  HENT:  Head: Normocephalic.  Eyes: Pupils are equal, round, and reactive to light.  Neck: Normal range of motion.  Cardiovascular: Normal rate.   Respiratory: Effort normal.  GI: Soft.  Musculoskeletal:       Left shoulder: He exhibits decreased range of motion, tenderness, pain, spasm and decreased strength.  He exhibits no swelling, no  deformity and normal pulse.       Right elbow: Normal.      Left elbow: Normal.       Right wrist: Normal.       Left wrist: Normal.       Left upper arm: He exhibits tenderness and edema. He exhibits no deformity.       Arms:      There is a long incision over the left lateral arm with a central area of relating wound approximately 7 x 5 mm. Depth of 1 cm. No drainage. No surrounding erythema. Surrounding tenderness around the wound and deltoid biceps and triceps.  Neurological: He is alert and oriented to person, place, and time. He has normal reflexes. No cranial nerve deficit. He exhibits normal muscle tone. Coordination normal.  Skin: No rash noted. No erythema. No pallor.  Psychiatric: He has a normal mood and affect. His behavior is normal. Judgment and thought content normal.     Assessment/Plan X-ray was done in the office it shows no evidence of breakdown of the hardware there is a proximal osteotomy which is intact.  Impression the patient has increasing pain which uncontrolled by oral medications. He is a central open area of his wound which raises a question of postoperative wound infection  The patient be admitted for pain control and IV antibiotics.  His white count is elevated at 12.5. A C-reactive protein is pending. His sedimentation rate was 24 which is elevated. He also was hypokalemic.  Fuller Canada 06/03/2012, 8:00 AM

## 2012-06-03 NOTE — Progress Notes (Signed)
UR Chart Review Completed  

## 2012-06-04 MED ORDER — VANCOMYCIN HCL 1000 MG IV SOLR
1500.0000 mg | INTRAVENOUS | Status: DC
  Administered 2012-06-04 – 2012-06-08 (×5): 1500 mg via INTRAVENOUS
  Filled 2012-06-04 (×6): qty 1500

## 2012-06-04 MED ORDER — HYDROMORPHONE HCL PF 1 MG/ML IJ SOLN
1.0000 mg | INTRAMUSCULAR | Status: DC | PRN
  Administered 2012-06-04 – 2012-06-08 (×35): 1 mg via INTRAVENOUS
  Filled 2012-06-04 (×37): qty 1

## 2012-06-04 NOTE — Progress Notes (Signed)
Subjective: Burning pain left arm mild improvement 2   Objective: Vital signs in last 24 hours: Temp:  [97.9 F (36.6 C)-98 F (36.7 C)] 97.9 F (36.6 C) (06/21 0551) Pulse Rate:  [59-70] 59  (06/21 0551) Resp:  [18-20] 20  (06/21 0551) BP: (103-131)/(65-79) 103/65 mmHg (06/21 0551) SpO2:  [98 %-100 %] 98 % (06/21 0551)  Intake/Output from previous day: 06/20 0701 - 06/21 0700 In: 2229.2 [P.O.:1080; I.V.:1149.2] Out: 10 [Urine:8; Stool:2] Intake/Output this shift: Total I/O In: 240 [P.O.:240] Out: -    Basename 06/02/12 1336  HGB 15.6    Basename 06/02/12 1336  WBC 12.5*  RBC 4.94  HCT 45.1  PLT 272    Basename 06/02/12 1336  NA 134*  K 2.8*  CL 91*  CO2 26  BUN 8  CREATININE 0.67  GLUCOSE 92  CALCIUM 10.2   No results found for this basename: LABPT:2,INR:2 in the last 72 hours  Sensation intact distally Intact pulses distally No cellulitis present Compartment soft  Assessment/Plan: Response to IV vanco with decreased pain  Correct potassium  Contin vanco at qd dosing , check trough     Fuller Canada 06/04/2012, 12:13 PM

## 2012-06-05 LAB — CBC
HCT: 43.1 % (ref 39.0–52.0)
Hemoglobin: 14.2 g/dL (ref 13.0–17.0)
MCHC: 32.9 g/dL (ref 30.0–36.0)

## 2012-06-05 LAB — POTASSIUM: Potassium: 4.3 mEq/L (ref 3.5–5.1)

## 2012-06-05 MED ORDER — GABAPENTIN 100 MG PO CAPS
100.0000 mg | ORAL_CAPSULE | Freq: Three times a day (TID) | ORAL | Status: DC
  Administered 2012-06-05 – 2012-06-08 (×9): 100 mg via ORAL
  Filled 2012-06-05 (×9): qty 1

## 2012-06-05 NOTE — Progress Notes (Signed)
Subjective: Pain - burning  I still have not gotten good pain control with percocet and dilaudid so I am going to try neurontin as well    Objective: Vital signs in last 24 hours: Temp:  [97.9 F (36.6 C)-98 F (36.7 C)] 98 F (36.7 C) (06/22 0514) Pulse Rate:  [58-70] 70  (06/22 0514) Resp:  [20] 20  (06/22 0514) BP: (102-143)/(54-95) 143/95 mmHg (06/22 0514) SpO2:  [98 %-100 %] 100 % (06/22 0514)  Intake/Output from previous day: 06/21 0701 - 06/22 0700 In: 3007 [P.O.:1200; I.V.:1307; IV Piggyback:500] Out: -  Intake/Output this shift: Total I/O In: 240 [P.O.:240] Out: -    Basename 06/05/12 0640  HGB 14.2    Basename 06/05/12 0640  WBC 6.0  RBC 4.49  HCT 43.1  PLT 231    Basename 06/05/12 0640  NA --  K 4.3  CL --  CO2 --  BUN --  CREATININE --  GLUCOSE --  CALCIUM --   No results found for this basename: LABPT:2,INR:2 in the last 72 hours  still no signs of cellulitis   Assessment/Plan: Wbc down to 6  k = 4.3 Esr pending and crp pending  vanc trough is 8 (normal 10) Check levels daily    Fuller Canada 06/05/2012, 1:51 PM

## 2012-06-06 MED ORDER — SODIUM CHLORIDE 0.9 % IJ SOLN
INTRAMUSCULAR | Status: AC
  Administered 2012-06-06: 17:00:00
  Filled 2012-06-06: qty 3

## 2012-06-06 MED ORDER — SODIUM CHLORIDE 0.9 % IJ SOLN
INTRAMUSCULAR | Status: AC
  Administered 2012-06-06: 07:00:00
  Filled 2012-06-06: qty 3

## 2012-06-06 NOTE — Progress Notes (Signed)
Subjective: I feel a little bit better today I can tell each day that I am getting better Objective: Vital signs in last 24 hours: Temp:  [97.6 F (36.4 C)-98.8 F (37.1 C)] 97.6 F (36.4 C) (06/23 1344) Pulse Rate:  [57-69] 66  (06/23 1344) Resp:  [20] 20  (06/23 1344) BP: (106-147)/(64-89) 106/64 mmHg (06/23 1344) SpO2:  [97 %-100 %] 97 % (06/23 1344)  Intake/Output from previous day: 06/22 0701 - 06/23 0700 In: 3845 [P.O.:1920; I.V.:1425; IV Piggyback:500] Out: -  Intake/Output this shift: Total I/O In: 480 [P.O.:480] Out: -    Basename 06/05/12 0640  HGB 14.2    Basename 06/05/12 0640  WBC 6.0  RBC 4.49  HCT 43.1  PLT 231    Basename 06/05/12 0640  NA --  K 4.3  CL --  CO2 --  BUN --  CREATININE --  GLUCOSE --  CALCIUM --   No results found for this basename: LABPT:2,INR:2 in the last 72 hours  No cellulitis present Compartment soft  Assessment/Plan: Probable infection left arm status post osteotomy left proximal humerus open reduction internal fixation and bone graft  Responding to IV vancomycin.  Labs pending tomorrow  The patient will need a PICC line and 23 days of antibiotics.   Fuller Canada 06/06/2012, 3:37 PM

## 2012-06-07 ENCOUNTER — Inpatient Hospital Stay (HOSPITAL_COMMUNITY): Payer: Medicaid Other

## 2012-06-07 ENCOUNTER — Encounter (HOSPITAL_COMMUNITY): Payer: Medicaid Other

## 2012-06-07 LAB — CBC
HCT: 41 % (ref 39.0–52.0)
Hemoglobin: 13.6 g/dL (ref 13.0–17.0)
MCH: 31.7 pg (ref 26.0–34.0)
MCV: 95.6 fL (ref 78.0–100.0)
Platelets: 219 10*3/uL (ref 150–400)
RBC: 4.29 MIL/uL (ref 4.22–5.81)

## 2012-06-07 LAB — BASIC METABOLIC PANEL
BUN: 12 mg/dL (ref 6–23)
CO2: 27 mEq/L (ref 19–32)
Calcium: 9.5 mg/dL (ref 8.4–10.5)
Creatinine, Ser: 1.05 mg/dL (ref 0.50–1.35)
Glucose, Bld: 116 mg/dL — ABNORMAL HIGH (ref 70–99)

## 2012-06-07 MED ORDER — SODIUM CHLORIDE 0.9 % IJ SOLN: 10.0000 mL | INTRAMUSCULAR | Status: DC | PRN

## 2012-06-07 MED ORDER — SODIUM CHLORIDE 0.9 % IJ SOLN
10.0000 mL | Freq: Two times a day (BID) | INTRAMUSCULAR | Status: DC
  Administered 2012-06-07: 10 mL
  Administered 2012-06-08: 40 mL
  Filled 2012-06-07: qty 3

## 2012-06-07 NOTE — Progress Notes (Signed)
Subjective: Less pain s/p PICC line d/w insertion but runnning fine    Objective: Vital signs in last 24 hours: Temp:  [98 F (36.7 C)-98.2 F (36.8 C)] 98 F (36.7 C) (06/24 1435) Pulse Rate:  [56-63] 56  (06/24 1435) Resp:  [20] 20  (06/24 1435) BP: (118-132)/(70-84) 125/77 mmHg (06/24 1435) SpO2:  [100 %] 100 % (06/24 1435)  Intake/Output from previous day: 06/23 0701 - 06/24 0700 In: 2932 [P.O.:1200; I.V.:1232; IV Piggyback:500] Out: -  Intake/Output this shift: Total I/O In: 720 [P.O.:720] Out: -    Basename 06/07/12 1408 06/05/12 0640  HGB 13.6 14.2    Basename 06/07/12 1408 06/05/12 0640  WBC 6.2 6.0  RBC 4.29 4.49  HCT 41.0 43.1  PLT 219 231    Basename 06/07/12 1408 06/05/12 0640  NA 137 --  K 4.0 4.3  CL 101 --  CO2 27 --  BUN 12 --  CREATININE 1.05 --  GLUCOSE 116* --  CALCIUM 9.5 --   No results found for this basename: LABPT:2,INR:2 in the last 72 hours  Incision: scant drainage No cellulitis present Compartment soft  Assessment/Plan: Labs came back late  D/c in am    Fuller Canada 06/07/2012, 6:00 PM

## 2012-06-08 MED ORDER — OXYCODONE-ACETAMINOPHEN 7.5-325 MG PO TABS: 1.0000 | ORAL_TABLET | ORAL | Status: DC | PRN

## 2012-06-08 MED ORDER — DIAZEPAM 10 MG PO TABS: 10.0000 mg | ORAL_TABLET | Freq: Four times a day (QID) | ORAL | Status: DC

## 2012-06-08 MED ORDER — GABAPENTIN 100 MG PO CAPS: 100.0000 mg | ORAL_CAPSULE | Freq: Three times a day (TID) | ORAL | Status: DC

## 2012-06-08 MED ORDER — VANCOMYCIN HCL 1000 MG IV SOLR: 1500.0000 mg | INTRAVENOUS | Status: DC

## 2012-06-08 MED ORDER — IBUPROFEN 800 MG PO TABS: 800.0000 mg | ORAL_TABLET | Freq: Three times a day (TID) | ORAL | Status: AC

## 2012-06-08 NOTE — Progress Notes (Signed)
Discharge instructions given to pt. With teach back given to rn, pt. Discharged to home with [facility] coming in am for iv  Vancomycin, Picc line covered with sleeve. Pt. Ambulated to car with aid and placed in car.

## 2012-06-08 NOTE — Discharge Instructions (Signed)
Left upper extremity exercises daily once a day  Wear sling as needed for comfort

## 2012-06-08 NOTE — Discharge Summary (Signed)
Physician Discharge Summary  Patient ID: Alan Jennings MRN: [id number] DOB/AGE: [birth year] 31 y.o.  Admit date: 06/02/2012 Discharge date: 06/08/2012  Admission Diagnoses: Left shoulder pain,Hypokalemia   Discharge Diagnoses: Left shoulder wound infection, hypokalemia  Principal Problem:  *Wound infection after surgery Active Problems:  Hypokalemia  Left shoulder pain   Discharged Condition: stable  Hospital Course: After admission on June 19 and initiation of IV vancomycin the patient's condition improved over the next few days with decreased pain and swelling in the left upper extremity. His sedimentation rate and C-reactive protein decreased as well. His white count dropped from 12 to 6. We also added Neurontin which helped with the paresthesias in the left upper extremity.   Consults: PICC line insertion  Significant Diagnostic Studies: labs:  His initial potassium was less than 3 and with oral supplementation improved to 4.3  C. reactive protein was 0.44 admission and the sedimentation rate was 24. This decreased to 0.36 and 4 after IV therapy. His vancomycin trough was 07/20/2020 on 1500 mg of vancomycin  Treatments: antibiotics: vancomycin  Discharge Exam: Blood pressure 131/81, pulse 61, temperature 97.5 F (36.4 C), temperature source Oral, resp. rate 18, height 5\' 8"  (1.727 m), weight 79.8 kg (175 lb 14.8 oz), SpO2 100.00%. General appearance: alert, cooperative, appears stated age and no distress Incision/Wound: clean and dry with a 13 x 15 mm wound with approximate depth of 5 mm clean granulation tissue at the base  Disposition: 01-Home or Self Care  Discharge Orders    Future Appointments: Provider: Department: Dept Phone: Center:   06/09/2012 8:30 AM Vickki Hearing, MD [facility] [phone] [facility]     Future Orders Please Complete By Expires   Diet - low sodium heart healthy      Home Health      Scheduling Instructions:   Dressing change  wet-to-dry dressing daily   Comments:   Vancomycin trough on Monday, July 1 and on Monday, July 8 and on Monday, July 15  Sed rate and C-reactive protein on Monday, July 1, Monday, July 8 and Monday, July 15   Questions: Responses:   To provide the following care/treatments RN   Face-to-face encounter      Comments:   I Fuller Canada certify that this patient is under my care and that I, or a nurse practitioner or physician's assistant working with me, had a face-to-face encounter that meets the physician face-to-face encounter requirements with this patient on 06/08/2012.   Questions: Responses:   The encounter with the patient was in whole, or in part, for the following medical condition, which is the primary reason for home health care Postoperative infection left shoulder arm   I certify that, based on my findings, the following services are medically necessary home health services Nursing   My clinical findings support the need for the above services Infection requiring IV antibiotics   Further, I certify that my clinical findings support that this patient is homebound (i.e. absences from home require considerable and taxing effort and are for medical reasons or religious services or infrequently or of short duration when for other reasons) OTHER SEE COMMENTS   To provide the following care/treatments RN   Increase activity slowly      Discharge instructions      Comments:   1500 mg of IV vancomycin x21 days with a stop date of July 16   Discharge wound care:      Comments:   Wet-to-dry dressing daily left shoulder  Medication List  As of 06/08/2012  7:44 AM   STOP taking these medications         doxycycline 50 MG capsule         TAKE these medications         atenolol-chlorthalidone 50-25 MG per tablet   Commonly known as: TENORETIC   Take 1 tablet by mouth every morning.      diazepam 10 MG tablet   Commonly known as: VALIUM   Take 1 tablet (10 mg total) by mouth  every 6 (six) hours.      Fish Oil 1000 MG Caps   Take 1,000 mg by mouth 2 (two) times daily.      gabapentin 100 MG capsule   Commonly known as: NEURONTIN   Take 1 capsule (100 mg total) by mouth 3 (three) times daily.      ibuprofen 800 MG tablet   Commonly known as: ADVIL,MOTRIN   Take 1 tablet (800 mg total) by mouth 3 (three) times daily.      oxyCODONE-acetaminophen 7.5-325 MG per tablet   Commonly known as: PERCOCET   Take 1 tablet by mouth every 4 (four) hours as needed for pain.      phenytoin 100 MG ER capsule   Commonly known as: DILANTIN   Take 100 mg by mouth daily.      sodium chloride 0.9 % SOLN 500 mL with vancomycin 1000 MG SOLR 1,500 mg   Inject 1,500 mg into the vein daily.           Follow-up Information    Follow up with Fuller Canada, MD on 06/14/2012.   Contact information:   [address] [place] [address] [phone]          Signed: Fuller Canada 06/08/2012, 7:44 AM

## 2012-06-09 ENCOUNTER — Ambulatory Visit: Payer: Medicaid Other | Admitting: Orthopedic Surgery

## 2012-06-10 NOTE — Telephone Encounter (Signed)
No note from the doctor, but Savio has had several appointments since the note

## 2012-06-14 ENCOUNTER — Ambulatory Visit (INDEPENDENT_AMBULATORY_CARE_PROVIDER_SITE_OTHER): Payer: Medicaid Other | Admitting: Orthopedic Surgery

## 2012-06-14 ENCOUNTER — Encounter: Payer: Self-pay | Admitting: Orthopedic Surgery

## 2012-06-14 VITALS — BP 140/82 | Ht 68.0 in | Wt 175.0 lb

## 2012-06-14 DIAGNOSIS — T8140XA Infection following a procedure, unspecified, initial encounter: Secondary | ICD-10-CM

## 2012-06-14 DIAGNOSIS — S42209A Unspecified fracture of upper end of unspecified humerus, initial encounter for closed fracture: Secondary | ICD-10-CM

## 2012-06-14 DIAGNOSIS — S42202A Unspecified fracture of upper end of left humerus, initial encounter for closed fracture: Secondary | ICD-10-CM

## 2012-06-14 DIAGNOSIS — T8149XA Infection following a procedure, other surgical site, initial encounter: Secondary | ICD-10-CM

## 2012-06-14 MED ORDER — DIAZEPAM 10 MG PO TABS: 10.0000 mg | ORAL_TABLET | Freq: Four times a day (QID) | ORAL | Status: AC

## 2012-06-14 MED ORDER — OXYCODONE-ACETAMINOPHEN 7.5-325 MG PO TABS: 1.0000 | ORAL_TABLET | ORAL | Status: AC | PRN

## 2012-06-14 NOTE — Patient Instructions (Addendum)
Dressing changes continue at home   Continue IV vancomycin

## 2012-06-14 NOTE — Progress Notes (Signed)
Patient ID: Alan Jennings, male   DOB: 03-16-1981, 31 y.o.   MRN: [id number] Chief Complaint  Patient presents with  . 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 Started on IV vancomycin currently at 1500 mg daily.  Most recent laboratory studies.  His wound is clean. He has no surrounding erythema. No warmth. He looks much better. He does not look toxic in any way. He is able to flex his arm to 90 of forward elevation.  Continue IV vancomycin. Continue Neurontin, which is controlling his paresthesias. Continue ibuprofen. Continue Valium for muscle spasms, which are primarily at night.  Follow up in 2 weeks.  X-ray and check laboratory studies.

## 2012-06-15 ENCOUNTER — Telehealth: Payer: Self-pay | Admitting: Orthopedic Surgery

## 2012-06-15 NOTE — Telephone Encounter (Addendum)
Call received from Kendrick Ranch, nurse at [facility].  Her direct ph# is [phone].  She states that she has been unable to reach patient at his cell phone#[phone]*OR [phone], and that she has also made 3 trips to his home regarding the wound care and the labs, and no one has been home; she states they keep the home padlocked.  She relates that she last saw patient on 06/09/12.  She verified patient's cell phone # and his alternate contact phone # (father's, #[phone].)   I asked her to call back after trying the alternate contact # to let our office know status.

## 2012-06-16 NOTE — Telephone Encounter (Signed)
I tried back to patient also, and reached patient at [facility] cell# [phone], and relayed all recent communication per notes entered.  He states his cell Ph # is [phone], and that he has minutes on it again.  He said he has been with his Dad a lot.  I strongly encouraged him to contact [facility] at office and also nurse directly in order to receive his home care and prescribed treatment.  I emphasized the importance of being available for the home care.  He said he would call now.  I relayed that he will also be receiving a letter as that has been in progress.

## 2012-06-16 NOTE — Telephone Encounter (Signed)
This information has been relayed to [facility]; our office relayed to Amy, pharmacist, to nurse noted above, and to supervisor at [facility], main office [phone].  Letter (certified) going out to patient as per Dr. Mort Sawyers note of 06/16/12.

## 2012-06-16 NOTE — Telephone Encounter (Signed)
Send him a letter certified

## 2012-06-20 ENCOUNTER — Encounter (HOSPITAL_COMMUNITY): Payer: Self-pay

## 2012-06-20 ENCOUNTER — Emergency Department (HOSPITAL_COMMUNITY)
Admission: EM | Admit: 2012-06-20 | Discharge: 2012-06-20 | Disposition: A | Discharge status: Home / Self Care | Payer: Medicaid Other | Attending: Emergency Medicine | Admitting: Emergency Medicine

## 2012-06-20 DIAGNOSIS — R197 Diarrhea, unspecified: Secondary | ICD-10-CM | POA: Insufficient documentation

## 2012-06-20 DIAGNOSIS — I10 Essential (primary) hypertension: Secondary | ICD-10-CM | POA: Insufficient documentation

## 2012-06-20 DIAGNOSIS — R110 Nausea: Secondary | ICD-10-CM

## 2012-06-20 DIAGNOSIS — F329 Major depressive disorder, single episode, unspecified: Secondary | ICD-10-CM | POA: Insufficient documentation

## 2012-06-20 DIAGNOSIS — Z9889 Other specified postprocedural states: Secondary | ICD-10-CM | POA: Insufficient documentation

## 2012-06-20 DIAGNOSIS — Z87891 Personal history of nicotine dependence: Secondary | ICD-10-CM | POA: Insufficient documentation

## 2012-06-20 DIAGNOSIS — F411 Generalized anxiety disorder: Secondary | ICD-10-CM | POA: Insufficient documentation

## 2012-06-20 DIAGNOSIS — F3289 Other specified depressive episodes: Secondary | ICD-10-CM | POA: Insufficient documentation

## 2012-06-20 MED ORDER — ONDANSETRON 4 MG PO TBDP: 4.0000 mg | ORAL_TABLET | Freq: Three times a day (TID) | ORAL | Status: AC | PRN

## 2012-06-20 MED ORDER — ONDANSETRON HCL 4 MG/2ML IJ SOLN
4.0000 mg | Freq: Once | INTRAMUSCULAR | Status: AC
  Administered 2012-06-20: 4 mg via INTRAMUSCULAR
  Filled 2012-06-20: qty 2

## 2012-06-20 MED ORDER — HYDROMORPHONE HCL PF 1 MG/ML IJ SOLN
2.0000 mg | Freq: Once | INTRAMUSCULAR | Status: AC
  Administered 2012-06-20: 2 mg via INTRAMUSCULAR
  Filled 2012-06-20: qty 2

## 2012-06-20 NOTE — ED Notes (Signed)
Complain of surgical infection to left shoulder. Pt states he is on IV antibiotics for infection

## 2012-06-20 NOTE — ED Provider Notes (Signed)
History     CSN: [id number]  Arrival date & time 06/20/12  1008   First MD Initiated Contact with Patient 06/20/12 1028      Chief Complaint  Patient presents with  . Wound Infection    (Consider location/radiation/quality/duration/timing/severity/associated sxs/prior treatment) HPI Comments: Pt had ORIF to L humerus by dr. Romeo Apple on 04-16-12 with subsequent infection.  Has been receiving vancomycin for past 2 weeks via PIC line.  Regimen was increased to 2 doses daily 2 days ago.  Home health RN checks surgical site and condition of PIC every Monday.  Scheduled to see dr. Romeo Apple for f/u on 06-29-12.  Pt has been having L arm pain since the surgery but developed nausea without vomiting and diarrhea last PM.  He called home health and was told to go to the ED.  No fever or chills  The history is provided by the patient. No language interpreter was used.    Past Medical History  Diagnosis Date  . Anxiety   . Seizures   . Depression   . Seizures   . Hypertension   . Complication of anesthesia     had a syncopal episode after rhinoplasty    Past Surgical History  Procedure Date  . Nose surgery     [facility]  . Rhinoplasty     [facility]  . Orif humerus fracture 04/26/2012    Procedure: OPEN REDUCTION INTERNAL FIXATION (ORIF) PROXIMAL HUMERUS FRACTURE;  Surgeon: Vickki Hearing, MD;  Location: [facility];  Service: Orthopedics;  Laterality: Left;  Open Reduction Internal Fixation of Left Proximal Humerus Fracture, Closing Wedge Osteotomy, Bone Graft  . Shoulder surgery     Family History  Problem Relation Age of Onset  . Heart disease    . Arthritis    . Anesthesia problems Neg Hx   . Hypotension Neg Hx   . Malignant hyperthermia Neg Hx   . Pseudochol deficiency Neg Hx     History  Substance Use Topics  . Smoking status: Former Smoker -- 0.5 packs/day for 7 years    Types: Cigarettes    Quit date: 09/19/2011  . Smokeless tobacco: Never Used  . Alcohol Use: Yes     weekend beer        Review of Systems  Constitutional: Negative for fever and chills.  Gastrointestinal: Positive for nausea and diarrhea. Negative for vomiting.  All other systems reviewed and are negative.    Allergies  Tramadol  Home Medications   Current Outpatient Rx  Name Route Sig Dispense Refill  . ATENOLOL-CHLORTHALIDONE 50-25 MG PO TABS Oral Take 1 tablet by mouth every morning.     Marland Kitchen DIAZEPAM 10 MG PO TABS Oral Take 1 tablet (10 mg total) by mouth every 6 (six) hours. 60 tablet 0  . GABAPENTIN 100 MG PO CAPS Oral Take 1 capsule (100 mg total) by mouth 3 (three) times daily. 60 capsule 2  . HEPARIN & NACL LOCK FLUSH IV Intravenous Inject 5 mLs into the vein daily.    Marland Kitchen FISH OIL 1000 MG PO CAPS Oral Take 1,000 mg by mouth 2 (two) times daily.    Marland Kitchen ONDANSETRON 4 MG PO TBDP Oral Take 1 tablet (4 mg total) by mouth every 8 (eight) hours as needed for nausea. 20 tablet 1  . OXYCODONE-ACETAMINOPHEN 7.5-325 MG PO TABS Oral Take 1 tablet by mouth every 4 (four) hours as needed for pain. 84 tablet 0  . PHENYTOIN SODIUM EXTENDED 100 MG PO CAPS Oral Take  100 mg by mouth daily.    Marland Kitchen VANCOMYCIN 1500 MG IVPB Intravenous Inject 1,500 mg into the vein daily. 1500 mg 0    BP 144/91  Pulse 94  Temp 97.8 F (36.6 C)  Resp 20  Ht 5\' 8"  (1.727 m)  Wt 176 lb (79.833 kg)  BMI 26.76 kg/m2  SpO2 100%  Physical Exam  Nursing note and vitals reviewed. Constitutional: He is oriented to person, place, and time. Vital signs are normal. He appears well-developed and well-nourished. He is cooperative.  Non-toxic appearance. He does not have a sickly appearance. He appears ill. No distress.  HENT:  Head: Normocephalic and atraumatic.  Eyes: EOM are normal.  Neck: Normal range of motion.  Cardiovascular: Normal rate, regular rhythm, normal heart sounds and intact distal pulses.   Pulmonary/Chest: Effort normal and breath sounds normal. No respiratory distress.  Abdominal: Soft. He exhibits no distension.  There is no tenderness.  Musculoskeletal: He exhibits tenderness.       Left shoulder: He exhibits decreased range of motion, tenderness and pain. He exhibits no swelling and normal pulse.       Arms: Neurological: He is alert and oriented to person, place, and time.  Skin: Skin is warm and dry.  Psychiatric: He has a normal mood and affect. Judgment normal.    ED Course  Procedures (including critical care time)  Labs Reviewed - No data to display No results found.   1. Nausea   2. Diarrhea       MDM  rx-zofran 4 mg, 20 OTC imodium for diarrhea F/u with dr. Romeo Apple Return to ED if sxs worsen/change.        Evalina Field, [place] 06/20/12 1110

## 2012-06-20 NOTE — ED Provider Notes (Signed)
Medical screening examination/treatment/procedure(s) were performed by non-physician practitioner and as supervising physician I was immediately available for consultation/collaboration.   Raedyn Klinck III, MD 06/20/12 1531 

## 2012-06-22 ENCOUNTER — Encounter (HOSPITAL_COMMUNITY): Payer: Self-pay | Admitting: *Deleted

## 2012-06-22 ENCOUNTER — Emergency Department (HOSPITAL_COMMUNITY): Payer: Medicaid Other

## 2012-06-22 ENCOUNTER — Telehealth: Payer: Self-pay | Admitting: Orthopedic Surgery

## 2012-06-22 ENCOUNTER — Inpatient Hospital Stay (HOSPITAL_COMMUNITY)
Admission: EM | Admit: 2012-06-22 | Discharge: 2012-06-25 | DRG: 863 | Disposition: A | Discharge status: Home / Self Care | Payer: Medicaid Other | Attending: Orthopedic Surgery | Admitting: Orthopedic Surgery

## 2012-06-22 DIAGNOSIS — T8140XA Infection following a procedure, unspecified, initial encounter: Secondary | ICD-10-CM

## 2012-06-22 DIAGNOSIS — E876 Hypokalemia: Secondary | ICD-10-CM

## 2012-06-22 DIAGNOSIS — Z9889 Other specified postprocedural states: Secondary | ICD-10-CM

## 2012-06-22 DIAGNOSIS — M25519 Pain in unspecified shoulder: Secondary | ICD-10-CM

## 2012-06-22 DIAGNOSIS — F411 Generalized anxiety disorder: Secondary | ICD-10-CM | POA: Diagnosis present

## 2012-06-22 DIAGNOSIS — Z87891 Personal history of nicotine dependence: Secondary | ICD-10-CM

## 2012-06-22 DIAGNOSIS — I10 Essential (primary) hypertension: Secondary | ICD-10-CM | POA: Diagnosis present

## 2012-06-22 DIAGNOSIS — Y9210 Unspecified residential institution as the place of occurrence of the external cause: Secondary | ICD-10-CM | POA: Diagnosis present

## 2012-06-22 DIAGNOSIS — F3289 Other specified depressive episodes: Secondary | ICD-10-CM | POA: Diagnosis present

## 2012-06-22 DIAGNOSIS — F329 Major depressive disorder, single episode, unspecified: Secondary | ICD-10-CM | POA: Diagnosis present

## 2012-06-22 DIAGNOSIS — T8149XA Infection following a procedure, other surgical site, initial encounter: Secondary | ICD-10-CM

## 2012-06-22 DIAGNOSIS — Y838 Other surgical procedures as the cause of abnormal reaction of the patient, or of later complication, without mention of misadventure at the time of the procedure: Secondary | ICD-10-CM | POA: Diagnosis present

## 2012-06-22 LAB — CBC WITH DIFFERENTIAL/PLATELET
Eosinophils Relative: 0 % (ref 0–5)
HCT: 44.5 % (ref 39.0–52.0)
Hemoglobin: 15.8 g/dL (ref 13.0–17.0)
Lymphocytes Relative: 22 % (ref 12–46)
Lymphs Abs: 2.2 10*3/uL (ref 0.7–4.0)
MCV: 89.2 fL (ref 78.0–100.0)
Monocytes Absolute: 0.6 10*3/uL (ref 0.1–1.0)
Monocytes Relative: 6 % (ref 3–12)
Neutro Abs: 7.1 10*3/uL (ref 1.7–7.7)
RBC: 4.99 MIL/uL (ref 4.22–5.81)
RDW: 12.9 % (ref 11.5–15.5)
WBC: 9.9 10*3/uL (ref 4.0–10.5)

## 2012-06-22 LAB — COMPREHENSIVE METABOLIC PANEL
AST: 46 U/L — ABNORMAL HIGH (ref 0–37)
CO2: 23 mEq/L (ref 19–32)
Calcium: 10.3 mg/dL (ref 8.4–10.5)
Chloride: 90 mEq/L — ABNORMAL LOW (ref 96–112)
Creatinine, Ser: 0.71 mg/dL (ref 0.50–1.35)
GFR calc Af Amer: >90 mL/min (ref >90)
GFR calc non Af Amer: >90 mL/min (ref >90)
Glucose, Bld: 99 mg/dL (ref 70–99)
Total Bilirubin: 0.4 mg/dL (ref 0.3–1.2)

## 2012-06-22 MED ORDER — POTASSIUM CHLORIDE 10 MEQ/100ML IV SOLN
10.0000 meq | Freq: Once | INTRAVENOUS | Status: AC
  Administered 2012-06-22: 10 meq via INTRAVENOUS
  Filled 2012-06-22: qty 100

## 2012-06-22 MED ORDER — CHLORTHALIDONE 25 MG PO TABS
25.0000 mg | ORAL_TABLET | Freq: Every day | ORAL | Status: DC
  Administered 2012-06-23 – 2012-06-25 (×3): 25 mg via ORAL
  Filled 2012-06-22 (×6): qty 1

## 2012-06-22 MED ORDER — ATENOLOL 25 MG PO TABS
50.0000 mg | ORAL_TABLET | Freq: Every day | ORAL | Status: DC
  Administered 2012-06-23 – 2012-06-25 (×3): 50 mg via ORAL
  Filled 2012-06-22 (×3): qty 2

## 2012-06-22 MED ORDER — DIAZEPAM 5 MG PO TABS
ORAL_TABLET | ORAL | Status: AC
  Administered 2012-06-22: 10 mg via ORAL
  Filled 2012-06-22: qty 2

## 2012-06-22 MED ORDER — OXYCODONE-ACETAMINOPHEN 7.5-325 MG PO TABS: 1.0000 | ORAL_TABLET | ORAL | Status: DC | PRN

## 2012-06-22 MED ORDER — VANCOMYCIN HCL 500 MG IV SOLR
INTRAVENOUS | Status: AC
  Filled 2012-06-22: qty 500

## 2012-06-22 MED ORDER — ATENOLOL-CHLORTHALIDONE 50-25 MG PO TABS: 1.0000 | ORAL_TABLET | Freq: Every morning | ORAL | Status: DC

## 2012-06-22 MED ORDER — POTASSIUM CHLORIDE CRYS ER 20 MEQ PO TBCR
40.0000 meq | EXTENDED_RELEASE_TABLET | Freq: Two times a day (BID) | ORAL | Status: DC
  Administered 2012-06-22 – 2012-06-25 (×6): 40 meq via ORAL
  Filled 2012-06-22 (×7): qty 2

## 2012-06-22 MED ORDER — ONDANSETRON 4 MG PO TBDP: 4.0000 mg | ORAL_TABLET | Freq: Three times a day (TID) | ORAL | Status: DC | PRN

## 2012-06-22 MED ORDER — SODIUM CHLORIDE 0.9 % IV SOLN
INTRAVENOUS | Status: DC
  Administered 2012-06-22 – 2012-06-25 (×3): via INTRAVENOUS

## 2012-06-22 MED ORDER — OXYCODONE-ACETAMINOPHEN 5-325 MG PO TABS
1.5000 | ORAL_TABLET | ORAL | Status: DC | PRN
  Administered 2012-06-22 – 2012-06-24 (×5): 1.5 via ORAL
  Filled 2012-06-22 (×7): qty 2

## 2012-06-22 MED ORDER — HEPARIN & NACL LOCK FLUSH 10-0.9 UNIT/ML-% IV KIT: 5.0000 mL | PACK | Freq: Two times a day (BID) | INTRAVENOUS | Status: DC

## 2012-06-22 MED ORDER — GABAPENTIN 100 MG PO CAPS
100.0000 mg | ORAL_CAPSULE | Freq: Three times a day (TID) | ORAL | Status: DC
  Administered 2012-06-22 – 2012-06-25 (×8): 100 mg via ORAL
  Filled 2012-06-22 (×9): qty 1

## 2012-06-22 MED ORDER — HEPARIN SOD (PORK) LOCK FLUSH 100 UNIT/ML IV SOLN
INTRAVENOUS | Status: AC
  Filled 2012-06-22: qty 5

## 2012-06-22 MED ORDER — POTASSIUM CHLORIDE CRYS ER 20 MEQ PO TBCR
40.0000 meq | EXTENDED_RELEASE_TABLET | Freq: Once | ORAL | Status: AC
  Administered 2012-06-22: 40 meq via ORAL
  Filled 2012-06-22: qty 2

## 2012-06-22 MED ORDER — PHENYTOIN SODIUM EXTENDED 100 MG PO CAPS
ORAL_CAPSULE | ORAL | Status: AC
  Filled 2012-06-22: qty 1

## 2012-06-22 MED ORDER — VANCOMYCIN HCL 1000 MG IV SOLR
1750.0000 mg | Freq: Two times a day (BID) | INTRAVENOUS | Status: DC
  Administered 2012-06-22 – 2012-06-25 (×6): 1750 mg via INTRAVENOUS
  Filled 2012-06-22 (×12): qty 1750

## 2012-06-22 MED ORDER — DIAZEPAM 5 MG PO TABS
10.0000 mg | ORAL_TABLET | Freq: Four times a day (QID) | ORAL | Status: DC
  Administered 2012-06-22 – 2012-06-25 (×11): 10 mg via ORAL
  Filled 2012-06-22 (×11): qty 2

## 2012-06-22 MED ORDER — VANCOMYCIN HCL IN DEXTROSE 1-5 GM/200ML-% IV SOLN
INTRAVENOUS | Status: AC
  Filled 2012-06-22: qty 200

## 2012-06-22 MED ORDER — HYDROMORPHONE HCL PF 1 MG/ML IJ SOLN
0.5000 mg | INTRAMUSCULAR | Status: DC | PRN
  Administered 2012-06-23 – 2012-06-25 (×19): 1 mg via INTRAVENOUS
  Filled 2012-06-22 (×19): qty 1

## 2012-06-22 MED ORDER — HYDROMORPHONE HCL PF 1 MG/ML IJ SOLN
1.0000 mg | Freq: Once | INTRAMUSCULAR | Status: AC
  Administered 2012-06-22: 1 mg via INTRAVENOUS
  Filled 2012-06-22: qty 1

## 2012-06-22 MED ORDER — ONDANSETRON HCL 4 MG/2ML IJ SOLN: 4.0000 mg | Freq: Four times a day (QID) | INTRAMUSCULAR | Status: DC | PRN

## 2012-06-22 MED ORDER — PHENYTOIN SODIUM EXTENDED 100 MG PO CAPS
100.0000 mg | ORAL_CAPSULE | Freq: Every day | ORAL | Status: DC
  Administered 2012-06-23 – 2012-06-25 (×3): 100 mg via ORAL
  Filled 2012-06-22 (×4): qty 1

## 2012-06-22 MED ORDER — SODIUM CHLORIDE 0.9 % IV SOLN
Freq: Once | INTRAVENOUS | Status: AC
  Administered 2012-06-22: 17:00:00 via INTRAVENOUS

## 2012-06-22 MED ORDER — LORAZEPAM 2 MG/ML IJ SOLN
1.0000 mg | Freq: Once | INTRAMUSCULAR | Status: AC
  Administered 2012-06-22: 1 mg via INTRAVENOUS
  Filled 2012-06-22: qty 1

## 2012-06-22 NOTE — ED Notes (Signed)
Pain in left arm, s/p ortho procedure, sent for evaluation and possible admission by Dr Romeo Apple

## 2012-06-22 NOTE — Telephone Encounter (Addendum)
Joyice Faster called, he said that [facility] has called him  this morning and told him the antibiotics are not showing up in his lab work.  He insists he is taking them by IV as directed.Marland Kitchen  He said his white  cell count is up to 12.3. Says he does not feel any better, has an upset stomach, can't eat, running a low grade fever  especially in the morning. He said the pharmacist advised him to increase the IV to 3 times a day and told him to let you know how he is feeling.  Alan Jennings  Called back to relay that he has burning with urination also very yellow and some odor He uses [facility] in [place]  Patient's phone # [phone]

## 2012-06-22 NOTE — ED Notes (Signed)
CRITICAL VALUE ALERT  Critical value received:  Potassium 2.3  Date of notification:  06/22/2012  Time of notification:  1651  Critical value read back:yes  Nurse who received alert:  Tarri Glenn RN  MD notified (1st page):  Dr Estell Harpin   Time of first page:  1651  MD notified (2nd page):  Time of second page:  Responding MD:  Dr Estell Harpin  Time MD responded:  [phone]

## 2012-06-22 NOTE — ED Notes (Signed)
Patient has picc in right upper arm, Had antibiotics given through it this morning. Blood drawn from it yesterday. Blood drawn for lab by protocol.

## 2012-06-22 NOTE — Telephone Encounter (Signed)
Tell him to go to the er

## 2012-06-22 NOTE — Telephone Encounter (Signed)
Advised the patient of doctor's reply °

## 2012-06-22 NOTE — H&P (Signed)
Alan Jennings is an 31 y.o. male.   Chief Complaint: Burning left shoulder nausea and vomiting  HPI: 31 year old male had closed proximal humerus fracture initially treated with immobilization in [facility]. Presented to our office for further evaluation secondary to continued pain and loss of motion eventually underwent osteotomy open treatment internal fixation and bone graft left proximal humerus for malunion in May of 2013. He did well for approximately 2 weeks and then developed a postoperative wound breakdown and eventual infection. He was treated for wound infection with IV vancomycin and discharged home. The patient says that he started experiencing nausea and vomiting after his vancomycin dosing was increased secondary to a low trough. He was unable to keep any food down. His arms are burned. He presented to the emergency room on July 7 was treated for nausea and vomiting presented back to the ER for continued malaise. He is admitted for hypokalemia, left shoulder pain, left shoulder wound infection. Nausea and vomiting.  Past Medical History  Diagnosis Date  . Anxiety   . Seizures   . Depression   . Seizures   . Hypertension   . Complication of anesthesia     had a syncopal episode after rhinoplasty    Past Surgical History  Procedure Date  . Nose surgery     [facility]  . Rhinoplasty     [facility]  . Orif humerus fracture 04/26/2012    Procedure: OPEN REDUCTION INTERNAL FIXATION (ORIF) PROXIMAL HUMERUS FRACTURE;  Surgeon: Vickki Hearing, MD;  Location: [facility];  Service: Orthopedics;  Laterality: Left;  Open Reduction Internal Fixation of Left Proximal Humerus Fracture, Closing Wedge Osteotomy, Bone Graft  . Shoulder surgery     Family History  Problem Relation Age of Onset  . Heart disease    . Arthritis    . Anesthesia problems Neg Hx   . Hypotension Neg Hx   . Malignant hyperthermia Neg Hx   . Pseudochol deficiency Neg Hx    Social History:  reports that he quit  smoking about 9 months ago. His smoking use included Cigarettes. He has a 3.5 pack-year smoking history. He has never used smokeless tobacco. He reports that he drinks alcohol. He reports that he does not use illicit drugs.  Allergies:  Allergies  Allergen Reactions  . Tramadol Other (See Comments)    SEIZURES     (Not in a hospital admission)  Results for orders placed during the hospital encounter of 06/22/12 (from the past 48 hour(s))  CBC WITH DIFFERENTIAL     Status: Normal   Collection Time   06/22/12  4:02 PM      Component Value Range Comment   WBC 9.9  4.0 - 10.5 K/uL    RBC 4.99  4.22 - 5.81 MIL/uL    Hemoglobin 15.8  13.0 - 17.0 g/dL    HCT 16.1  09.6 - 04.5 %    MCV 89.2  78.0 - 100.0 fL    MCH 31.7  26.0 - 34.0 pg    MCHC 35.5  30.0 - 36.0 g/dL    RDW 40.9  81.1 - 91.4 %    Platelets 292  150 - 400 K/uL    Neutrophils Relative 71  43 - 77 %    Neutro Abs 7.1  1.7 - 7.7 K/uL    Lymphocytes Relative 22  12 - 46 %    Lymphs Abs 2.2  0.7 - 4.0 K/uL    Monocytes Relative 6  3 -  12 %    Monocytes Absolute 0.6  0.1 - 1.0 K/uL    Eosinophils Relative 0  0 - 5 %    Eosinophils Absolute 0.0  0.0 - 0.7 K/uL    Basophils Relative 0  0 - 1 %    Basophils Absolute 0.0  0.0 - 0.1 K/uL   COMPREHENSIVE METABOLIC PANEL     Status: Abnormal   Collection Time   06/22/12  4:02 PM      Component Value Range Comment   Sodium 132 (*) 135 - 145 mEq/L    Potassium 2.3 (*) 3.5 - 5.1 mEq/L    Chloride 90 (*) 96 - 112 mEq/L    CO2 23  19 - 32 mEq/L    Glucose, Bld 99  70 - 99 mg/dL    BUN 5 (*) 6 - 23 mg/dL    Creatinine, Ser 8.11  0.50 - 1.35 mg/dL    Calcium 91.4  8.4 - 10.5 mg/dL    Total Protein 8.6 (*) 6.0 - 8.3 g/dL    Albumin 4.8  3.5 - 5.2 g/dL    AST 46 (*) 0 - 37 U/L    ALT 104 (*) 0 - 53 U/L    Alkaline Phosphatase 126 (*) 39 - 117 U/L    Total Bilirubin 0.4  0.3 - 1.2 mg/dL    GFR calc non Af Amer >90  >90 mL/min    GFR calc Af Amer >90  >90 mL/min    Dg Chest 2  View  06/22/2012  *RADIOLOGY REPORT*  Clinical Data: Elevated white blood cell count  CHEST - 2 VIEW  Comparison: 06/07/2012  Findings: Stable.  Bronchitic changes.  No consolidation.  No pneumothorax and no pleural effusion.  IMPRESSION: Bronchitic changes.  Stable.  Original Report Authenticated By: Donavan Burnet, M.D.   Dg Shoulder Left  06/22/2012  *RADIOLOGY REPORT*  Clinical Data: Elevated white blood cell count  LEFT SHOULDER - 2+ VIEW  Comparison: 06/02/2012  Findings: Plates and screws transfix a proximal humerus osteotomy. No breakage or loosening of the hardware.  Stable alignment.  IMPRESSION: ORIF left proximal humerus osteotomy.  Stable hardware without evidence of breakage or loosening.  Original Report Authenticated By: Donavan Burnet, M.D.    ROS  Blood pressure 165/64, pulse 79, temperature 98.9 F (37.2 C), resp. rate 17, height 5\' 8"  (1.727 m), weight 176 lb (79.833 kg), SpO2 97.00%. Physical Exam  Constitutional: He is oriented to person, place, and time. He appears well-developed and well-nourished. No distress.  HENT:  Head: Normocephalic.  Eyes: Pupils are equal, round, and reactive to light.  Neck: Normal range of motion.  Cardiovascular: Normal rate and intact distal pulses.   Respiratory: Effort normal.  GI: Soft. He exhibits no distension.  Musculoskeletal:       Right shoulder: Normal.       Right elbow: Normal.      Left elbow: Normal.       Right wrist: Normal.       Left wrist: Normal.       Right hip: Normal.       Left hip: Normal.       Right knee: Normal.       Left knee: Normal.       Right ankle: Normal.       Left ankle: Normal.       Right upper arm: Normal.       Left upper arm: He exhibits no tenderness, no  bony tenderness, no swelling, no edema and no deformity.       Right forearm: Normal.       Right hand: Normal.       Right upper leg: Normal.       Left upper leg: Normal.       Right lower leg: Normal.       Left lower leg: Normal.        Right foot: Normal.       Left foot: Normal.       The left shoulder has a wound superficial with good granulation tissue. There is no surrounding erythema. There is no soft tissue tenderness. There is no soft tissue swelling. There is no drainage.  Neurological: He is alert and oriented to person, place, and time. He has normal reflexes. He exhibits normal muscle tone.  Skin: Skin is warm and dry.  Psychiatric: He has a normal mood and affect. His behavior is normal. Judgment and thought content normal.      Assessment/Plan Hypokalemia Nausea Vomiting Left shoulder wound infection At this point it is prudent to get further imaging with a  CAT scan pre-and post contrast to rule out any abscess.  Replace potassium Resume, continue iv vancomycin, stabilize vancomycin level. Followup sedimentation rate and C-reactive protein. White count is now 9.9   Fuller Canada 06/22/2012, 7:29 PM

## 2012-06-22 NOTE — ED Provider Notes (Addendum)
History     CSN: [id number]  Arrival date & time 06/22/12  1451   First MD Initiated Contact with Patient 06/22/12 1544      Chief Complaint  Patient presents with  . Post-op Problem    (Consider location/radiation/quality/duration/timing/severity/associated sxs/prior treatment) Patient is a 31 y.o. male presenting with shoulder pain. The history is provided by the patient (pt complains of left shoulder and diarha). No language interpreter was used.  Shoulder Pain This is a new problem. The current episode started more than 2 days ago. The problem occurs constantly. The problem has not changed since onset.Pertinent negatives include no chest pain, no abdominal pain and no headaches. Nothing aggravates the symptoms. Nothing relieves the symptoms.    Past Medical History  Diagnosis Date  . Anxiety   . Seizures   . Depression   . Seizures   . Hypertension   . Complication of anesthesia     had a syncopal episode after rhinoplasty    Past Surgical History  Procedure Date  . Nose surgery     [facility]  . Rhinoplasty     [facility]  . Orif humerus fracture 04/26/2012    Procedure: OPEN REDUCTION INTERNAL FIXATION (ORIF) PROXIMAL HUMERUS FRACTURE;  Surgeon: Vickki Hearing, MD;  Location: [facility];  Service: Orthopedics;  Laterality: Left;  Open Reduction Internal Fixation of Left Proximal Humerus Fracture, Closing Wedge Osteotomy, Bone Graft  . Shoulder surgery     Family History  Problem Relation Age of Onset  . Heart disease    . Arthritis    . Anesthesia problems Neg Hx   . Hypotension Neg Hx   . Malignant hyperthermia Neg Hx   . Pseudochol deficiency Neg Hx     History  Substance Use Topics  . Smoking status: Former Smoker -- 0.5 packs/day for 7 years    Types: Cigarettes    Quit date: 09/19/2011  . Smokeless tobacco: Never Used  . Alcohol Use: Yes     weekend beer      Review of Systems  Constitutional: Negative for fatigue.  HENT: Negative for congestion, sinus  pressure and ear discharge.   Eyes: Negative for discharge.  Respiratory: Negative for cough.   Cardiovascular: Negative for chest pain.  Gastrointestinal: Positive for vomiting and diarrhea. Negative for abdominal pain.  Genitourinary: Negative for frequency and hematuria.  Musculoskeletal: Negative for back pain.       Left shoulder pain  Skin: Negative for rash.  Neurological: Negative for seizures and headaches.  Hematological: Negative.   Psychiatric/Behavioral: Negative for hallucinations.    Allergies  Tramadol  Home Medications   Current Outpatient Rx  Name Route Sig Dispense Refill  . ATENOLOL-CHLORTHALIDONE 50-25 MG PO TABS Oral Take 1 tablet by mouth every morning.     Marland Kitchen DIAZEPAM 10 MG PO TABS Oral Take 1 tablet (10 mg total) by mouth every 6 (six) hours. 60 tablet 0  . GABAPENTIN 100 MG PO CAPS Oral Take 1 capsule (100 mg total) by mouth 3 (three) times daily. 60 capsule 2  . HEPARIN & NACL LOCK FLUSH IV Intravenous Inject 5 mLs into the vein 2 (two) times daily.     Marland Kitchen FISH OIL 1000 MG PO CAPS Oral Take 1,000 mg by mouth 2 (two) times daily.    Marland Kitchen ONDANSETRON 4 MG PO TBDP Oral Take 1 tablet (4 mg total) by mouth every 8 (eight) hours as needed for nausea. 20 tablet 1  . OXYCODONE-ACETAMINOPHEN 7.5-325 MG PO TABS  Oral Take 1 tablet by mouth every 4 (four) hours as needed for pain. 84 tablet 0  . PHENYTOIN SODIUM EXTENDED 100 MG PO CAPS Oral Take 100 mg by mouth daily.    Marland Kitchen VANCOMYCIN 1500 MG IVPB Intravenous Inject 1,500 mg into the vein every 12 (twelve) hours.      BP 165/64  Pulse 79  Temp 98.9 F (37.2 C)  Resp 17  Ht 5\' 8"  (1.727 m)  Wt 176 lb (79.833 kg)  BMI 26.76 kg/m2  SpO2 97%  Physical Exam  Constitutional: He is oriented to person, place, and time. He appears well-developed.  HENT:  Head: Normocephalic and atraumatic.  Eyes: Conjunctivae and EOM are normal. No scleral icterus.  Neck: Neck supple. No thyromegaly present.  Cardiovascular: Normal  rate and regular rhythm.  Exam reveals no gallop and no friction rub.   No murmur heard. Pulmonary/Chest: No stridor. He has no wheezes. He has no rales. He exhibits no tenderness.  Abdominal: He exhibits no distension. There is no tenderness. There is no rebound.  Musculoskeletal: He exhibits no edema.       Healing incission left shoulder.  Mild tenderness  Lymphadenopathy:    He has no cervical adenopathy.  Neurological: He is oriented to person, place, and time. Coordination normal.  Skin: No rash noted. No erythema.  Psychiatric: He has a normal mood and affect. His behavior is normal.    ED Course  Procedures (including critical care time)  Labs Reviewed  COMPREHENSIVE METABOLIC PANEL - Abnormal; Notable for the following:    Sodium 132 (*)     Potassium 2.3 (*)     Chloride 90 (*)     BUN 5 (*)     Total Protein 8.6 (*)     AST 46 (*)     ALT 104 (*)     Alkaline Phosphatase 126 (*)     All other components within normal limits  CBC WITH DIFFERENTIAL   Dg Chest 2 View  06/22/2012  *RADIOLOGY REPORT*  Clinical Data: Elevated white blood cell count  CHEST - 2 VIEW  Comparison: 06/07/2012  Findings: Stable.  Bronchitic changes.  No consolidation.  No pneumothorax and no pleural effusion.  IMPRESSION: Bronchitic changes.  Stable.  Original Report Authenticated By: Donavan Burnet, M.D.   Dg Shoulder Left  06/22/2012  *RADIOLOGY REPORT*  Clinical Data: Elevated white blood cell count  LEFT SHOULDER - 2+ VIEW  Comparison: 06/02/2012  Findings: Plates and screws transfix a proximal humerus osteotomy. No breakage or loosening of the hardware.  Stable alignment.  IMPRESSION: ORIF left proximal humerus osteotomy.  Stable hardware without evidence of breakage or loosening.  Original Report Authenticated By: Donavan Burnet, M.D.     1. Hypokalemia   2. Shoulder pain      Date: 07/13/2012  Rate: 83  Rhythm: normal sinus rhythm  QRS Axis: normal  Intervals: normal  ST/T Wave  abnormalities: nonspecific ST changes  Conduction Disutrbances:none  Narrative Interpretation:   Old EKG Reviewed: none available    MDM          [name], MD 06/22/12 1927  [name], MD 07/13/12 1357

## 2012-06-23 ENCOUNTER — Inpatient Hospital Stay (HOSPITAL_COMMUNITY): Payer: Medicaid Other

## 2012-06-23 DIAGNOSIS — T8140XA Infection following a procedure, unspecified, initial encounter: Secondary | ICD-10-CM

## 2012-06-23 DIAGNOSIS — Z9889 Other specified postprocedural states: Secondary | ICD-10-CM

## 2012-06-23 LAB — BASIC METABOLIC PANEL
BUN: 7 mg/dL (ref 6–23)
Calcium: 9.7 mg/dL (ref 8.4–10.5)
Chloride: 98 mEq/L (ref 96–112)
Creatinine, Ser: 0.79 mg/dL (ref 0.50–1.35)
GFR calc Af Amer: >90 mL/min (ref >90)
GFR calc non Af Amer: >90 mL/min (ref >90)

## 2012-06-23 LAB — VANCOMYCIN, TROUGH: Vancomycin Tr: 13 ug/mL (ref 10.0–20.0)

## 2012-06-23 LAB — SEDIMENTATION RATE: Sed Rate: 10 mm/hr (ref 0–16)

## 2012-06-23 MED ORDER — IOHEXOL 300 MG/ML  SOLN
100.0000 mL | Freq: Once | INTRAMUSCULAR | Status: AC | PRN
  Administered 2012-06-23: 100 mL via INTRAVENOUS

## 2012-06-23 MED ORDER — SODIUM CHLORIDE 0.9 % IJ SOLN
INTRAMUSCULAR | Status: AC
  Administered 2012-06-23: 22:00:00
  Filled 2012-06-23: qty 3

## 2012-06-23 NOTE — Progress Notes (Signed)
UR Chart Review Completed  

## 2012-06-23 NOTE — Progress Notes (Signed)
Subjective:     Patient reports pain as 2-10.    Objective: Vital signs in last 24 hours: Temp:  [97.9 F (36.6 C)-98.9 F (37.2 C)] 97.9 F (36.6 C) (07/10 0512) Pulse Rate:  [77-93] 86  (07/10 0512) Resp:  [11-20] 18  (07/10 0512) BP: (140-184)/(64-105) 165/105 mmHg (07/10 0512) SpO2:  [96 %-100 %] 100 % (07/10 0512) Weight:  [79.833 kg (176 lb)-83.4 kg (183 lb 13.8 oz)] 83.4 kg (183 lb 13.8 oz) (07/09 2100)  Intake/Output from previous day: 07/09 0701 - 07/10 0700 In: 240 [P.O.:240] Out: -  Intake/Output this shift:     Basename 06/22/12 1602  HGB 15.8    Basename 06/22/12 1602  WBC 9.9  RBC 4.99  HCT 44.5  PLT 292    Basename 06/23/12 0541 06/22/12 1602  NA 135 132*  K 3.5 2.3*  CL 98 90*  CO2 27 23  BUN 7 5*  CREATININE 0.79 0.71  GLUCOSE 101* 99  CALCIUM 9.7 10.3   No results found for this basename: LABPT:2,INR:2 in the last 72 hours  Neurologically intact Neurovascular intact Sensation intact distally Intact pulses distally Compartment soft  Assessment/Plan:     CT scan follow potassium follow trough levels C. reactive protein and sed rate.  Fuller Canada 06/23/2012, 7:43 AM

## 2012-06-23 NOTE — Care Management Note (Addendum)
    Page 1 of 1   06/25/2012     1:00:16 PM   CARE MANAGEMENT NOTE 06/23/2012  Patient:  Alan Jennings, Alan Jennings   Account Number:  [id number]  Date Initiated:  06/23/2012  Documentation initiated by:  Sharrie Rothman  Subjective/Objective Assessment:   Pt admitted from home with n/v and hypokalemia. Pt lives with father and is independent with ADL's. Pt will return home at discharge. Pt is active with [facility] for IV AB therapy.     Action/Plan:   CM will arrange resumption of HH at discharge per MD order.   Anticipated DC Date:  06/25/2012   Anticipated DC Plan:  HOME W HOME HEALTH SERVICES      DC Planning Services  CM consult      [facility] Choice  HOME HEALTH   Choice offered to / List presented to:  [name]        HH arranged  IV Antibiotics      HH agency  [facility]   Status of service:  In process, will continue to follow Medicare Important Message given?   (If response is "NO", the following Medicare IM given date fields will be blank) Date Medicare IM given:   Date Additional Medicare IM given:    Discharge Disposition:    Per UR Regulation:    If discussed at Long Length of Stay Meetings, dates discussed:    Comments:  06/23/12 1130 Arlyss Queen, RN BSN CM

## 2012-06-24 DIAGNOSIS — T8140XA Infection following a procedure, unspecified, initial encounter: Secondary | ICD-10-CM

## 2012-06-24 DIAGNOSIS — Z9889 Other specified postprocedural states: Secondary | ICD-10-CM

## 2012-06-24 LAB — URINE CULTURE: Culture: NO GROWTH

## 2012-06-24 LAB — BASIC METABOLIC PANEL
CO2: 25 mEq/L (ref 19–32)
Chloride: 103 mEq/L (ref 96–112)
Glucose, Bld: 96 mg/dL (ref 70–99)
Potassium: 3.7 mEq/L (ref 3.5–5.1)
Sodium: 138 mEq/L (ref 135–145)

## 2012-06-24 MED ORDER — OXYCODONE-ACETAMINOPHEN 5-325 MG PO TABS
1.5000 | ORAL_TABLET | ORAL | Status: DC
  Administered 2012-06-24 – 2012-06-25 (×6): 1.5 via ORAL
  Filled 2012-06-24 (×6): qty 2

## 2012-06-24 MED ORDER — SODIUM CHLORIDE 0.9 % IJ SOLN
INTRAMUSCULAR | Status: AC
  Administered 2012-06-24: 07:00:00
  Filled 2012-06-24: qty 3

## 2012-06-24 NOTE — Progress Notes (Signed)
Subjective: Burning    Objective: Vital signs in last 24 hours: Temp:  [98.1 F (36.7 C)-98.3 F (36.8 C)] 98.3 F (36.8 C) (07/11 0505) Pulse Rate:  [70-78] 75  (07/11 0505) Resp:  [19-20] 20  (07/11 0505) BP: (120-139)/(49-85) 120/49 mmHg (07/11 0505) SpO2:  [96 %-100 %] 96 % (07/11 0505)  Intake/Output from previous day: 07/10 0701 - 07/11 0700 In: 3728.8 [P.O.:1560; I.V.:2168.8] Out: 202 [Urine:201; Stool:1] Intake/Output this shift:     Basename 06/22/12 1602  HGB 15.8    Basename 06/22/12 1602  WBC 9.9  RBC 4.99  HCT 44.5  PLT 292    Basename 06/24/12 0627 06/23/12 0541  NA 138 135  K 3.7 3.5  CL 103 98  CO2 25 27  BUN 7 7  CREATININE 0.81 0.79  GLUCOSE 96 101*  CALCIUM 9.3 9.7   No results found for this basename: LABPT:2,INR:2 in the last 72 hours  Incision: scant drainage No cellulitis present  Assessment/Plan: Check wbc in am if decreasing then discharge    Fuller Canada 06/24/2012, 8:02 AM

## 2012-06-25 DIAGNOSIS — Z9889 Other specified postprocedural states: Secondary | ICD-10-CM

## 2012-06-25 DIAGNOSIS — T8140XA Infection following a procedure, unspecified, initial encounter: Secondary | ICD-10-CM

## 2012-06-25 LAB — CBC
HCT: 41.9 % (ref 39.0–52.0)
Hemoglobin: 14.1 g/dL (ref 13.0–17.0)
MCH: 31.3 pg (ref 26.0–34.0)
MCHC: 33.7 g/dL (ref 30.0–36.0)
RBC: 4.5 MIL/uL (ref 4.22–5.81)

## 2012-06-25 LAB — BASIC METABOLIC PANEL
BUN: 8 mg/dL (ref 6–23)
CO2: 26 mEq/L (ref 19–32)
Chloride: 101 mEq/L (ref 96–112)
GFR calc non Af Amer: >90 mL/min (ref >90)
Glucose, Bld: 86 mg/dL (ref 70–99)
Potassium: 3.9 mEq/L (ref 3.5–5.1)
Sodium: 136 mEq/L (ref 135–145)

## 2012-06-25 MED ORDER — HYDROCODONE-ACETAMINOPHEN 10-325 MG PO TABS: 1.0000 | ORAL_TABLET | ORAL | Status: AC | PRN

## 2012-06-25 MED ORDER — SODIUM CHLORIDE 0.9 % IJ SOLN
INTRAMUSCULAR | Status: AC
  Administered 2012-06-25: 10 mL
  Filled 2012-06-25: qty 6

## 2012-06-25 MED ORDER — SODIUM CHLORIDE 0.9 % IJ SOLN
INTRAMUSCULAR | Status: AC
  Administered 2012-06-25: 06:00:00
  Filled 2012-06-25: qty 3

## 2012-06-25 MED ORDER — HEPARIN SOD (PORK) LOCK FLUSH 100 UNIT/ML IV SOLN
250.0000 [IU] | INTRAVENOUS | Status: DC | PRN
  Filled 2012-06-25: qty 5

## 2012-06-25 MED ORDER — VANCOMYCIN HCL 1000 MG IV SOLR: 1750.0000 mg | Freq: Two times a day (BID) | INTRAVENOUS | Status: DC

## 2012-06-25 MED ORDER — DIAZEPAM 10 MG PO TABS: 10.0000 mg | ORAL_TABLET | Freq: Four times a day (QID) | ORAL | Status: DC

## 2012-06-25 NOTE — Progress Notes (Signed)
Assessed pts PICC line to be reddened around the site. Dressing changed due to appearance of dressing, as pt has showered with PICC line, without wrapping it on this admission. Pt c/o some tenderness at site initially. PICC line flushes well, with good blood return. Temperature is WNL. Will notify day shift of this, so that PICC RN can be contacted to assess the site.

## 2012-06-25 NOTE — Progress Notes (Signed)
UR chart review completed.  

## 2012-06-25 NOTE — Discharge Summary (Signed)
Physician Discharge Summary  Patient ID: Alan Jennings MRN: [id number] DOB/AGE: Jun 19, 1981 30 y.o.  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 *    Discharged Condition: stable  Hospital Course: The patient was admitted July 9 with hypokalemia and increasing pain in his left shoulder, ongoing treatment with IV vancomycin for an open wound of the left shoulder. He was restarted on a vancomycin regimen 1750 mg twice a day , his vancomycin level responded well his sedimentation rate went down his white count count went from 12-7. He had a CT scan with and without contrast which revealed no evidence of infection in the deep soft tissue of his left arm. Potassium level and improved from 2.5 up to 3.9.     Consults: None  Significant Diagnostic Studies: labs:  CBC    Component Value Date/Time   WBC 7.7 06/25/2012 0743   RBC 4.50 06/25/2012 0743   HGB 14.1 06/25/2012 0743   HCT 41.9 06/25/2012 0743   PLT 245 06/25/2012 0743   MCV 93.1 06/25/2012 0743   MCH 31.3 06/25/2012 0743   MCHC 33.7 06/25/2012 0743   RDW 13.2 06/25/2012 0743   LYMPHSABS 2.2 06/22/2012 1602   MONOABS 0.6 06/22/2012 1602   EOSABS 0.0 06/22/2012 1602   BASOSABS 0.0 06/22/2012 1602    BMET    Component Value Date/Time   NA 136 06/25/2012 0548   K 3.9 06/25/2012 0548   CL 101 06/25/2012 0548   CO2 26 06/25/2012 0548   GLUCOSE 86 06/25/2012 0548   BUN 8 06/25/2012 0548   CREATININE 0.81 06/25/2012 0548   CREATININE 0.94 02/11/2012 1057   CALCIUM 9.4 06/25/2012 0548   GFRNONAA >90 06/25/2012 0548   GFRAA >90 06/25/2012 0548   ESR 10 ON 06-23-2012 VANC TR 13   Treatments: IV hydration, antibiotics: vancomycin and oral potassium  Discharge Exam: Blood pressure 150/99, pulse 69, temperature 97.8 F (36.6 C), temperature source  Oral, resp. rate 20, height 5\' 8"  (1.727 m), weight 83.4 kg (183 lb 13.8 oz), SpO2 98.00%. Incision/Wound: the wound is clean granulation bed the width is approximately 6 mm the length is approximately 8 mm with a depth of 4 mm  Disposition: 01-Home or Self Care  Discharge Orders    Future Appointments: Provider: Department: Dept Phone: Center:   06/29/2012 8:45 AM Vickki Hearing, MD [facility] [phone] [facility]     Future Orders Please Complete By Expires   CBC w/Diff      Comments:   Every monday   Sed Rate (ESR)      Comments:   Every Monday   C-reactive protein      Comments:   Every monday   Vancomycin, trough      Comments:   every Monday   Home Health      Scheduling Instructions:   Check picc line   Draw labs every Monday   Educate: dressing change 3 x a day   Questions: Responses:   To provide the following care/treatments RN   Face-to-face encounter      Comments:   I Fuller Canada certify that this patient is under my care and that I, or a nurse practitioner or physician's assistant working with me, had a face-to-face encounter that meets the physician face-to-face encounter requirements with this patient on 06/25/2012.  Open wound poor  compliance   Questions: Responses:   The encounter with the patient was in whole, or in part, for the following medical condition, which is the primary reason for home health care wound infection   I certify that, based on my findings, the following services are medically necessary home health services Nursing   My clinical findings support the need for the above services Unhealed surgical wound   Further, I certify that my clinical findings support that this patient is homebound (i.e. absences from home require considerable and taxing effort and are for medical reasons or religious services or infrequently or of short duration when for other reasons) OTHER SEE COMMENTS   To provide the following care/treatments RN       Medication List  As of 06/25/2012 10:27 AM   STOP taking these medications         oxyCODONE-acetaminophen 7.5-325 MG per tablet         TAKE these medications         atenolol-chlorthalidone 50-25 MG per tablet   Commonly known as: TENORETIC   Take 1 tablet by mouth every morning.      diazepam 10 MG tablet   Commonly known as: VALIUM   Take 1 tablet (10 mg total) by mouth every 6 (six) hours.      Fish Oil 1000 MG Caps   Take 1,000 mg by mouth 2 (two) times daily.      gabapentin 100 MG capsule   Commonly known as: NEURONTIN   Take 1 capsule (100 mg total) by mouth 3 (three) times daily.      HEPARIN & NACL LOCK FLUSH IV   Inject 5 mLs into the vein 2 (two) times daily.      HYDROcodone-acetaminophen 10-325 MG per tablet   Commonly known as: NORCO   Take 1 tablet by mouth every 4 (four) hours as needed for pain.      ondansetron 4 MG disintegrating tablet   Commonly known as: ZOFRAN-ODT   Take 1 tablet (4 mg total) by mouth every 8 (eight) hours as needed for nausea.      phenytoin 100 MG ER capsule   Commonly known as: DILANTIN   Take 100 mg by mouth daily.      sodium chloride 0.9 % SOLN 500 mL with vancomycin 1000 MG SOLR   Inject 1,500 mg into the vein every 12 (twelve) hours.      sodium chloride 0.9 % SOLN 500 mL with vancomycin 1000 MG SOLR 1,750 mg   Inject 1,750 mg into the vein every 12 (twelve) hours.           NOTE: The vancomycin 1500 mg every 12 has been stopped  Follow-up Information    Follow up with Fuller Canada, MD. (845am)    Contact information:   [address], [address] [place] [address] [phone]         followup on Tuesday check wound reviewed laboratory studies with vancomycin trough, sedimentation rate and C-reactive protein and white count  Signed: Fuller Canada 06/25/2012, 10:27 AM

## 2012-06-25 NOTE — Progress Notes (Signed)
Notified Dr. Hilda Lias who is on call for Dr. Romeo Apple of pts elevated BP of 150/99 this morning. No new orders received. Sheryn Bison

## 2012-06-25 NOTE — Progress Notes (Signed)
PICC line nurse in to assess PICC line.  States that she will speak to Dr. Romeo Apple about the line.  She does not feel the need to replace at this time.  I will continue to monitor.  PICC line nurse changed the dressing as well.

## 2012-06-28 ENCOUNTER — Encounter: Payer: Self-pay | Admitting: Orthopedic Surgery

## 2012-06-28 ENCOUNTER — Telehealth: Payer: Self-pay | Admitting: Orthopedic Surgery

## 2012-06-28 NOTE — Telephone Encounter (Signed)
Received call from Angie, nurse at [facility], with the following result information:   Vancomycin trough:  <3.5 (states "should be between 10 and 20.")    C-reactive protein:  1.82    (ref range 0.0 to 0.75)  She states patient has been compliant with taking medication.  States she is scheduled to draw labs again next Monday, 07/05/12.  She states also that patient relates "when I am in hospital I am fine."  [facility] phone # is [phone]; patient ph# is [phone] (Home).   Patient is scheduled for appointment here in office tomorrow (06/29/12).

## 2012-06-29 ENCOUNTER — Encounter: Payer: Self-pay | Admitting: Orthopedic Surgery

## 2012-06-29 ENCOUNTER — Ambulatory Visit (INDEPENDENT_AMBULATORY_CARE_PROVIDER_SITE_OTHER): Payer: Medicaid Other | Admitting: Orthopedic Surgery

## 2012-06-29 VITALS — BP 112/60 | Ht 68.0 in | Wt 183.0 lb

## 2012-06-29 DIAGNOSIS — T8140XA Infection following a procedure, unspecified, initial encounter: Secondary | ICD-10-CM

## 2012-06-29 DIAGNOSIS — Z9889 Other specified postprocedural states: Secondary | ICD-10-CM

## 2012-06-29 DIAGNOSIS — E876 Hypokalemia: Secondary | ICD-10-CM

## 2012-06-29 DIAGNOSIS — T8149XA Infection following a procedure, other surgical site, initial encounter: Secondary | ICD-10-CM

## 2012-06-29 NOTE — Patient Instructions (Addendum)
Continue iv antibiotics   Apply max freeze to arm as needed

## 2012-06-29 NOTE — Telephone Encounter (Signed)
Patient came for appointment today as scheduled. Noted.

## 2012-06-29 NOTE — Progress Notes (Signed)
Patient ID: Alan Jennings, male   DOB: [birth year], 31 y.o.   MRN: [id number] Chief Complaint  Patient presents with  . Follow-up    follow up and xray left shoulder, DOS 04/26/12    BP 112/60  Ht 5\' 8"  (1.727 m)  Wt 183 lb (83.008 kg)  BMI 27.82 kg/m2  This is after being in the hospital on IV vancomycin and 4 hypokalemia. No nausea or vomiting. Pain is controlled well with hydrocodone and ibuprofen and Neurontin.  Still having some burning in his biceps.  Recommend some x-rays.  His laboratory came back white count of 10 , sed rate of 7 and vancomycin. Level was 3.5  low [trough], CRP-1.82 however clinically he looks fine. Feels fine.  Follow-up in one week on Monday for a wound check

## 2012-06-30 ENCOUNTER — Telehealth: Payer: Self-pay | Admitting: Orthopedic Surgery

## 2012-06-30 NOTE — Telephone Encounter (Signed)
Returned call left voicemail advising no dose changes at this time

## 2012-06-30 NOTE — Telephone Encounter (Signed)
Per call received from Amy, [facility], Ph # [phone], dosage instructions are needed for Vancomycin.  States patient's hospital discharge instructions, which is the last information she has received, indicates in large letters that Dr. Romeo Apple is to dose the medication.  Office notes from patient's visit yesterday, 06/29/12, do not show dosage that I was able to see  Please call (ph# noted) or fax to [phone].

## 2012-07-05 ENCOUNTER — Encounter: Payer: Self-pay | Admitting: Orthopedic Surgery

## 2012-07-05 ENCOUNTER — Ambulatory Visit (INDEPENDENT_AMBULATORY_CARE_PROVIDER_SITE_OTHER): Payer: Medicaid Other | Admitting: Orthopedic Surgery

## 2012-07-05 VITALS — BP 150/90

## 2012-07-05 DIAGNOSIS — M62838 Other muscle spasm: Secondary | ICD-10-CM

## 2012-07-05 MED ORDER — DIAZEPAM 10 MG PO TABS: 10.0000 mg | ORAL_TABLET | Freq: Four times a day (QID) | ORAL | Status: AC

## 2012-07-05 NOTE — Progress Notes (Signed)
Patient ID: Alan Jennings, male   DOB: February 07, 1981, 31 y.o.   MRN: [id number] Clean wound he feels good   Continue 1750 mg of bid antibiotics

## 2012-07-05 NOTE — Patient Instructions (Addendum)
Continue dressings and meds

## 2012-07-07 ENCOUNTER — Encounter: Payer: Self-pay | Admitting: Orthopedic Surgery

## 2012-07-13 ENCOUNTER — Encounter: Payer: Self-pay | Admitting: Orthopedic Surgery

## 2012-07-19 ENCOUNTER — Encounter: Payer: Self-pay | Admitting: Orthopedic Surgery

## 2012-07-19 ENCOUNTER — Ambulatory Visit (INDEPENDENT_AMBULATORY_CARE_PROVIDER_SITE_OTHER): Payer: Medicaid Other | Admitting: Orthopedic Surgery

## 2012-07-19 VITALS — BP 120/70 | Ht 68.0 in | Wt 178.6 lb

## 2012-07-19 DIAGNOSIS — Z9889 Other specified postprocedural states: Secondary | ICD-10-CM

## 2012-07-19 MED ORDER — DIAZEPAM 10 MG PO TABS: 10.0000 mg | ORAL_TABLET | Freq: Four times a day (QID) | ORAL | Status: DC | PRN

## 2012-07-19 MED ORDER — DIAZEPAM 10 MG PO TABS: 10.0000 mg | ORAL_TABLET | Freq: Four times a day (QID) | ORAL | Status: DC

## 2012-07-19 NOTE — Patient Instructions (Addendum)
Continue medication as is

## 2012-07-19 NOTE — Progress Notes (Signed)
Patient ID: Alan Jennings, male   DOB: May 18, 1981, 31 y.o.   MRN: [id number] Chief Complaint  Patient presents with  . Wound Check    wound check left shoulder    Alan Jennings says his arm feels great the wound has closed completely  Current vancomycin dose 1750 mg twice a day  White count 6.7, sedimentation rate 5, C. reactive protein 1.2, trough 7.3.  July 23 labs from a week ago white count 6.3 sedimentation rate 4, C. reactive protein 0.89, trough 3.5  The wound has indeed closed over there is no cellulitis no swelling no tenderness in the arm  Continue current medications refill Valium 10 mg every 6 hours #90  Followup one week

## 2012-07-27 ENCOUNTER — Ambulatory Visit (INDEPENDENT_AMBULATORY_CARE_PROVIDER_SITE_OTHER): Payer: Medicaid Other | Admitting: Orthopedic Surgery

## 2012-07-27 ENCOUNTER — Encounter: Payer: Self-pay | Admitting: Orthopedic Surgery

## 2012-07-27 VITALS — Ht 68.0 in | Wt 178.0 lb

## 2012-07-27 DIAGNOSIS — L089 Local infection of the skin and subcutaneous tissue, unspecified: Secondary | ICD-10-CM

## 2012-07-27 MED ORDER — HYDROCODONE-ACETAMINOPHEN 10-325 MG PO TABS: 1.0000 | ORAL_TABLET | ORAL | Status: DC | PRN

## 2012-07-27 NOTE — Patient Instructions (Addendum)
Take medication including Aug 19th   Dr will remove PICC line

## 2012-07-27 NOTE — Progress Notes (Signed)
Patient ID: Alan Jennings, male   DOB: 20-Jun-1981, 31 y.o.   MRN: [id number] Chief Complaint  Patient presents with  . Follow-up    1 week recheck on left shoulder.    Ht 5\' 8"  (1.727 m)  Wt 178 lb (80.74 kg)  BMI 27.06 kg/m2  vanc tr 3.5 CRP .59  WBC 7.0 N 68 ESR 2   Wound clean   C/o dull ache today prob weather related   This is week 5 of 6 for IV vanco 1750 mg   Her lab tech.  Stop antibiotics on the 19th PICC line in until the 26 doctor to remove if  his labs are normal

## 2012-07-28 ENCOUNTER — Encounter: Payer: Self-pay | Admitting: Orthopedic Surgery

## 2012-08-03 ENCOUNTER — Telehealth: Payer: Self-pay | Admitting: Orthopedic Surgery

## 2012-08-03 ENCOUNTER — Encounter: Payer: Self-pay | Admitting: Orthopedic Surgery

## 2012-08-03 ENCOUNTER — Ambulatory Visit (INDEPENDENT_AMBULATORY_CARE_PROVIDER_SITE_OTHER): Payer: Medicaid Other | Admitting: Orthopedic Surgery

## 2012-08-03 VITALS — BP 140/90 | Ht 68.0 in | Wt 178.0 lb

## 2012-08-03 DIAGNOSIS — IMO0002 Reserved for concepts with insufficient information to code with codable children: Secondary | ICD-10-CM

## 2012-08-03 DIAGNOSIS — S42209A Unspecified fracture of upper end of unspecified humerus, initial encounter for closed fracture: Secondary | ICD-10-CM

## 2012-08-03 DIAGNOSIS — M792 Neuralgia and neuritis, unspecified: Secondary | ICD-10-CM

## 2012-08-03 DIAGNOSIS — Z9889 Other specified postprocedural states: Secondary | ICD-10-CM

## 2012-08-03 DIAGNOSIS — T8140XA Infection following a procedure, unspecified, initial encounter: Secondary | ICD-10-CM

## 2012-08-03 DIAGNOSIS — S42202A Unspecified fracture of upper end of left humerus, initial encounter for closed fracture: Secondary | ICD-10-CM

## 2012-08-03 DIAGNOSIS — T8149XA Infection following a procedure, other surgical site, initial encounter: Secondary | ICD-10-CM

## 2012-08-03 MED ORDER — GABAPENTIN 100 MG PO CAPS: 300.0000 mg | ORAL_CAPSULE | Freq: Three times a day (TID) | ORAL | Status: DC

## 2012-08-03 MED ORDER — MORPHINE SULFATE ER 15 MG PO TBCR: 15.0000 mg | EXTENDED_RELEASE_TABLET | Freq: Two times a day (BID) | ORAL | Status: DC

## 2012-08-03 NOTE — Telephone Encounter (Signed)
Noted  

## 2012-08-03 NOTE — Telephone Encounter (Signed)
Received call from patient regarding prescription issued at office visit today for: morphine (MS CONTIN) 15 MG 12 hr tablet [[id number]]   - Patient first of all, said he has switched from [facility] [place], to [facility], [place].  States had been told by pharmacy that this medication requires pre-approval from [employer], which he was told needs to come from [place], which he was told can take up to 72 hours.  - Patient called back a few moments later, stating he would self-pay for the medication, which he said would be $13.00; therefore, we do not need to obtain the pre-approval through Medicaid's new system.  If any questions, patient's ph# is [phone].

## 2012-08-03 NOTE — Progress Notes (Signed)
Patient ID: Alan Jennings, male   DOB: [birth year], 31 y.o.   MRN: [id number] Chief Complaint  Patient presents with  . Follow-up    one week recheck left shoulder and review labs , DOS 04/26/12    BP 140/90  Ht 5\' 8"  (1.727 m)  Wt 178 lb (80.74 kg)  BMI 27.06 kg/m2  Laboratory studies from August 14.  White count is 8 Sedimentation rate is 2.0 C-Reactive protein is 0.56 Vancomycin trough is 5 .vancomycin dose of 1750 mg  Incision is clean. No swelling no tenderness.  He does report increased feeling in the LEFT shoulder with increased pain. He is in his sling.  PICC Line is still in place.  At [facility] Contin for new pain does not feel related to the infection, pain, he was having

## 2012-08-03 NOTE — Patient Instructions (Addendum)
Wear sling   Continue ibuprofen  Increase gabapentin to 300 mg 3 x a day  Continue biofreeze   Start ms contin 15 mg twice a day

## 2012-08-04 ENCOUNTER — Encounter: Payer: Self-pay | Admitting: Orthopedic Surgery

## 2012-08-10 ENCOUNTER — Ambulatory Visit (INDEPENDENT_AMBULATORY_CARE_PROVIDER_SITE_OTHER): Payer: Medicaid Other | Admitting: Orthopedic Surgery

## 2012-08-10 ENCOUNTER — Encounter: Payer: Self-pay | Admitting: Orthopedic Surgery

## 2012-08-10 VITALS — BP 152/70 | Ht 68.0 in | Wt 178.0 lb

## 2012-08-10 DIAGNOSIS — M792 Neuralgia and neuritis, unspecified: Secondary | ICD-10-CM

## 2012-08-10 DIAGNOSIS — Z9889 Other specified postprocedural states: Secondary | ICD-10-CM

## 2012-08-10 DIAGNOSIS — T8149XA Infection following a procedure, other surgical site, initial encounter: Secondary | ICD-10-CM

## 2012-08-10 DIAGNOSIS — IMO0002 Reserved for concepts with insufficient information to code with codable children: Secondary | ICD-10-CM

## 2012-08-10 DIAGNOSIS — T8140XA Infection following a procedure, unspecified, initial encounter: Secondary | ICD-10-CM

## 2012-08-10 MED ORDER — DIAZEPAM 10 MG PO TABS: 10.0000 mg | ORAL_TABLET | Freq: Four times a day (QID) | ORAL | Status: DC | PRN

## 2012-08-10 NOTE — Patient Instructions (Signed)
Sling as needed  continue medications as ordered

## 2012-08-10 NOTE — Progress Notes (Signed)
Patient ID: Alan Jennings, male   DOB: 12-May-1981, 31 y.o.   MRN: [id number] Chief Complaint  Patient presents with  . Follow-up    one week follow up and review labs, remove Picc? 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 reactive protein is 1.09 white count is 8.9 sedimentation rate is 2 vancomycin trough less than 3.5  Clinical exam is normal pain from last week decreased significantly I think it was mostly weather related  No clinical signs of infection  Remove PICC line  Followup one week  Refill Valium for muscle spasms continue other medications as ordered. Patient did not require significant amount of MS Contin did not take all of the prescription.

## 2012-08-17 ENCOUNTER — Ambulatory Visit (INDEPENDENT_AMBULATORY_CARE_PROVIDER_SITE_OTHER): Payer: Medicaid Other | Admitting: Orthopedic Surgery

## 2012-08-17 ENCOUNTER — Encounter: Payer: Self-pay | Admitting: Orthopedic Surgery

## 2012-08-17 VITALS — BP 140/80 | Ht 68.0 in | Wt 178.0 lb

## 2012-08-17 DIAGNOSIS — T148XXA Other injury of unspecified body region, initial encounter: Secondary | ICD-10-CM

## 2012-08-17 DIAGNOSIS — L089 Local infection of the skin and subcutaneous tissue, unspecified: Secondary | ICD-10-CM

## 2012-08-17 MED ORDER — HYDROCODONE-ACETAMINOPHEN 10-325 MG PO TABS: 1.0000 | ORAL_TABLET | ORAL | Status: AC | PRN

## 2012-08-17 NOTE — Patient Instructions (Signed)
Return in 2 weeks for x-rays

## 2012-08-17 NOTE — Progress Notes (Signed)
Patient ID: Alan Jennings, male   DOB: February 21, 1981, 31 y.o.   MRN: [id number] Chief Complaint  Patient presents with  . Follow-up    1 week follow up, DOS 04/26/12   Wound looks good   He feels good mild discomfort   Refill med [id number] 84 , 5 rf   xrays in 2 weeks

## 2012-08-31 ENCOUNTER — Ambulatory Visit (INDEPENDENT_AMBULATORY_CARE_PROVIDER_SITE_OTHER): Payer: Medicaid Other | Admitting: Orthopedic Surgery

## 2012-08-31 ENCOUNTER — Ambulatory Visit (INDEPENDENT_AMBULATORY_CARE_PROVIDER_SITE_OTHER): Payer: Medicaid Other

## 2012-08-31 ENCOUNTER — Encounter: Payer: Self-pay | Admitting: Orthopedic Surgery

## 2012-08-31 VITALS — BP 110/68 | Ht 68.0 in | Wt 178.0 lb

## 2012-08-31 DIAGNOSIS — Z9889 Other specified postprocedural states: Secondary | ICD-10-CM

## 2012-08-31 DIAGNOSIS — M792 Neuralgia and neuritis, unspecified: Secondary | ICD-10-CM

## 2012-08-31 DIAGNOSIS — S42209A Unspecified fracture of upper end of unspecified humerus, initial encounter for closed fracture: Secondary | ICD-10-CM

## 2012-08-31 DIAGNOSIS — M25512 Pain in left shoulder: Secondary | ICD-10-CM

## 2012-08-31 DIAGNOSIS — M25519 Pain in unspecified shoulder: Secondary | ICD-10-CM

## 2012-08-31 DIAGNOSIS — T8149XA Infection following a procedure, other surgical site, initial encounter: Secondary | ICD-10-CM

## 2012-08-31 DIAGNOSIS — IMO0002 Reserved for concepts with insufficient information to code with codable children: Secondary | ICD-10-CM

## 2012-08-31 DIAGNOSIS — T8140XA Infection following a procedure, unspecified, initial encounter: Secondary | ICD-10-CM

## 2012-08-31 MED ORDER — DIAZEPAM 10 MG PO TABS: 10.0000 mg | ORAL_TABLET | Freq: Four times a day (QID) | ORAL | Status: DC | PRN

## 2012-08-31 NOTE — Progress Notes (Signed)
Patient ID: Alan Jennings, male   DOB: 01/02/1981, 31 y.o.   MRN: [id number] Chief Complaint  Patient presents with  . Follow-up    2 week recheck on left shoulder with xray.    Him today. He is here for x-ray. His x-ray shows no change in the position of his hardware.  His arm feels good. Today.  His wound looks good.  He has 90 of forward elevation and. He has intact flexion extension of the elbow.  No sensory changes in the deltoid.  Followup in 6 weeks after occupational therapy.  Refill for Valium

## 2012-08-31 NOTE — Patient Instructions (Addendum)
-   Start therapy

## 2012-09-14 ENCOUNTER — Ambulatory Visit (HOSPITAL_COMMUNITY): Payer: Medicaid Other | Admitting: Specialist

## 2012-09-21 ENCOUNTER — Other Ambulatory Visit: Payer: Self-pay | Admitting: Orthopedic Surgery

## 2012-09-21 DIAGNOSIS — M62838 Other muscle spasm: Secondary | ICD-10-CM

## 2012-09-22 ENCOUNTER — Ambulatory Visit (HOSPITAL_COMMUNITY)
Admission: RE | Admit: 2012-09-22 | Discharge: 2012-09-22 | Disposition: A | Discharge status: Home / Self Care | Payer: Medicaid Other | Source: Ambulatory Visit | Attending: Orthopedic Surgery | Admitting: Orthopedic Surgery

## 2012-09-22 DIAGNOSIS — Z9889 Other specified postprocedural states: Secondary | ICD-10-CM

## 2012-09-22 DIAGNOSIS — S4990XA Unspecified injury of shoulder and upper arm, unspecified arm, initial encounter: Secondary | ICD-10-CM

## 2012-09-22 DIAGNOSIS — M6281 Muscle weakness (generalized): Secondary | ICD-10-CM | POA: Insufficient documentation

## 2012-09-22 DIAGNOSIS — S42209A Unspecified fracture of upper end of unspecified humerus, initial encounter for closed fracture: Secondary | ICD-10-CM

## 2012-09-22 DIAGNOSIS — M25512 Pain in left shoulder: Secondary | ICD-10-CM

## 2012-09-22 DIAGNOSIS — IMO0001 Reserved for inherently not codable concepts without codable children: Secondary | ICD-10-CM | POA: Insufficient documentation

## 2012-09-22 DIAGNOSIS — S42309A Unspecified fracture of shaft of humerus, unspecified arm, initial encounter for closed fracture: Secondary | ICD-10-CM

## 2012-09-22 NOTE — Evaluation (Signed)
Occupational Therapy Evaluation  Patient Details  Name: Alan Jennings MRN: [id number] Date of Birth: [birth year]  Today's Date: 09/22/2012 Time: 5621-3086 OT Time Calculation (min): 33 min OT Evaluation 33' Visit#: 1  of 16   Re-eval: 10/20/12   Diagnosis: S/P Left Proximal Humerus Osteotomy and Internal Fixation Surgical Date: 04/26/12 Prior Therapy: none  Authorization: Medicaid  Authorization Time Period: Requesting visits through 10/20/12.  He will be approved for 3 visits  Authorization Visit#: 0  of 3    Past Medical History:  Past Medical History  Diagnosis Date  . Anxiety   . Seizures   . Depression   . Seizures   . Hypertension   . Complication of anesthesia     had a syncopal episode after rhinoplasty   Past Surgical History:  Past Surgical History  Procedure Date  . Nose surgery     [facility]  . Rhinoplasty     [facility]  . Orif humerus fracture 04/26/2012    Procedure: OPEN REDUCTION INTERNAL FIXATION (ORIF) PROXIMAL HUMERUS FRACTURE;  Surgeon: Vickki Hearing, MD;  Location: [facility];  Service: Orthopedics;  Laterality: Left;  Open Reduction Internal Fixation of Left Proximal Humerus Fracture, Closing Wedge Osteotomy, Bone Graft  . Shoulder surgery     Subjective S:  I broke my left arm in February 2012.  I had several complications and now I am ready for some therapy. Pertinent History: Mr. Buster fractured his left proximal humerus in February 2012.  He was treated and released by a Careers adviser in [place], [place].  He fell in March 2012 and refractured his left shoulder.  He consulted with Dr. Romeo Apple and was under his care for his left shoulder.  He had surgery on 04/26/12 for ORIF of his left humerus.  After surgery, he developed an infection at the surgical site and was hospitalized 2 times due to the infection.  His wound is now healed and he has been referred to occupational therapy for evaluation and treatment. Limitations: Per MD orders PROM, AAROM, isometrics, NO UBE and  NO WEIGHTS.  History of seizures. Special Tests: UEFI:  45/80= 56%  Patient Stated Goals: I want to get my left arm as close to perfect as possible.  I want to increase the movement and the strength. 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 Rudnik enjoys nature, walking in the woods, and is a Restaurant manager, fast food ADL/Vision/Perception ADL ADL Comments: Mr. Sellen is not able to use his left arm as dominant.  He is not able to reach or lift above waist height.  He uses his right arm to assist moving his left arm. Dominant Hand: Left Vision - History Baseline Vision: No visual deficits  Cognition/Observation Cognition Overall Cognitive Status: Appears within functional limits for tasks assessed Observation/Other Assessments Observations: 8" healed surgical incision scar along anterior shoulder  Sensation/Coordination/Edema Sensation Light Touch: Appears Intact Coordination Gross Motor Movements are Fluid and Coordinated: Yes Fine Motor Movements are Fluid and Coordinated: Yes  Additional Assessments RUE Strength Grip (lbs): 125 LUE AROM (degrees) LUE Overall AROM Comments: AAROM  assessed in supine  ER/IR with shoulder adducted Left Shoulder Flexion: 73 Degrees Left Shoulder ABduction: 46 Degrees Left Shoulder Internal Rotation: 55 Degrees Left Shoulder External Rotation: 25 Degrees LUE PROM (degrees) LUE Overall PROM Comments: Assessed in supine.   ER/IR with shoulder adducted Left Shoulder Flexion: 88 Degrees Left Shoulder ABduction: 57 Degrees Left Shoulder Internal Rotation: 55 Degrees Left Shoulder External Rotation: 30 Degrees LUE Strength Grip (lbs): 75 Palpation Palpation: Mod-max fascial restrictions in his left scapular region, upper arm, shoulder region, and trapezius region     Exercise/Treatments    Manual Therapy Manual Therapy: Myofascial release Myofascial Release: MFR and manual stretching of his left upper arm, scapular, and shoulder region and scar release to surgical scar.  PROM of his left shoulder through all ranges.  Occupational Therapy Assessment and Plan OT Assessment and Plan Clinical Impression Statement: A:  Patient is a 31 year old male with severly limited ROM and strength in his left upper extremity s/p left proximal humerus fracture with ORIF and osteotomy.  Skilled OT intervention is indicated to decrease pain and restrictions and increase functional AROM and strength in his LUE.   Pt will benefit from skilled therapeutic intervention in order to improve on the following deficits: Decreased strength;Decreased range of motion;Increased fascial restricitons;Increased muscle spasms;Impaired UE functional use;Pain Rehab Potential: Good OT Frequency: Min 2X/week OT Duration: 8 weeks OT Treatment/Interventions: Self-care/ADL training;Therapeutic exercise;Therapeutic activities;Manual therapy;Patient/family education OT Plan: P:  Skilled OT intervention to decrease pain and restrictions and increase PROM and AROM in his left shoulder region in order to use his LUE as dominant with daily activites.  Treatment Plan:  MFR and manual stretching with PROM in supine  Therapeutic Exercises:  PROM and isometrics in supine.  bridges in supine.  seated ball stretches, scapular mobility exercises, elbow- hand AROM, and grip strengthening with tputty and sponges.     Goals Short Term Goals Time to Complete Short Term  Goals: 4 weeks Short Term Goal 1: Patient  will be educated on HEP. Short Term Goal 2: Patient will increase PROM in his left shoulder to [facility] for increased ability to reach into overhead cabinets. Short Term Goal 3: Patient will increase left shoulder strength to 3+/5 for increased ability to lift up equipment when performing magic tricks. Short Term Goal 4: Patient will decrease fascial restrictions to moderate in his left shoulder region. Short Term Goal 5: Patient will decrease his pain level in his left shoulder to 2/10 when he is getting dressed in the morning. Additional Short Term Goals?: Yes Short Term Goal 6: Patient will increase his left grip strength by 10# for increased ability to open containers. Long Term Goals Time to Complete Long Term Goals: 8 weeks Long Term Goal 1: Patient will use his LUE as dominant with all daily, leisure, and work activities. Long Term Goal 2: Patient will increase his left shoulder AROM to [facility] for increased ability to reach overhead into cabinets and closets. Long Term Goal 3: Patient will increase his left shoulder strength to 4/5 for increased ability to lift items when completing yardwork. Long Term Goal 4: Patient will increase his left grip strength by 30# for increased ability to open containers. Long Term Goal 5: Patient will decrease pain in his left shoulder region to 1/10 during functional activities. Additional Long Term Goals?: Yes Long Term Goal 6: Patient will decrease left shoulder fascial restrictions to minimal.  Problem List Patient Active Problem List  Diagnosis  . CLOSED FRACTURE UNSPEC PART  UPPER END HUMERUS  . Fracture, humerus  . Fracture of humerus, proximal, left, closed  . Contusion of arm, left  . Shoulder injury  . Status post shoulder surgery  . Hypokalemia  . Left shoulder pain  . Wound infection after surgery  . Neurogenic pain  . Muscle weakness (generalized)    End of Session Activity Tolerance: Patient  tolerated treatment well General Behavior During Session: [facility] for tasks performed Cognition: [facility] for tasks performed OT Plan of Care OT Home Exercise Plan: Educated on use of tennis ball for self massage, shoulder stretches and towel slides.  GO    Shirlean Mylar, OTR/L  09/22/2012, 2:27 PM  Physician Documentation Your signature is required to indicate approval of the treatment plan as stated above.  Please sign and either send electronically or make a copy of this report for your files and return this physician signed original.  Please mark one 1.__approve of plan  2. ___approve of plan with the following conditions.   ______________________________                                                          _____________________ Physician Signature                                                                                                             Date

## 2012-09-29 ENCOUNTER — Ambulatory Visit (HOSPITAL_COMMUNITY): Payer: Medicaid Other | Admitting: Occupational Therapy

## 2012-10-06 ENCOUNTER — Inpatient Hospital Stay (HOSPITAL_COMMUNITY): Admission: RE | Admit: 2012-10-06 | Payer: Medicaid Other | Source: Ambulatory Visit | Admitting: Occupational Therapy

## 2012-10-12 ENCOUNTER — Encounter: Payer: Self-pay | Admitting: Orthopedic Surgery

## 2012-10-12 ENCOUNTER — Ambulatory Visit (INDEPENDENT_AMBULATORY_CARE_PROVIDER_SITE_OTHER): Payer: Medicaid Other | Admitting: Orthopedic Surgery

## 2012-10-12 VITALS — BP 120/78 | Ht 68.0 in | Wt 176.0 lb

## 2012-10-12 DIAGNOSIS — M792 Neuralgia and neuritis, unspecified: Secondary | ICD-10-CM

## 2012-10-12 DIAGNOSIS — IMO0002 Reserved for concepts with insufficient information to code with codable children: Secondary | ICD-10-CM

## 2012-10-12 DIAGNOSIS — M62838 Other muscle spasm: Secondary | ICD-10-CM

## 2012-10-12 MED ORDER — DIAZEPAM 10 MG PO TABS: 10.0000 mg | ORAL_TABLET | Freq: Four times a day (QID) | ORAL | Status: DC | PRN

## 2012-10-12 MED ORDER — GABAPENTIN 100 MG PO CAPS: 300.0000 mg | ORAL_CAPSULE | Freq: Three times a day (TID) | ORAL | Status: DC

## 2012-10-12 NOTE — Progress Notes (Signed)
Patient ID: Alan Jennings, male   DOB: 11/27/1981, 31 y.o.   MRN: [id number] Chief Complaint  Patient presents with  . Follow-up    recheck left humerus following OT, DOS 04/26/12    He is status post multiple surgeries on his left arm status post malunion of proximal humerus fracture treated with osteotomy open treatment internal fixation complicated by wound infection treated with IV antibiotics.  He is now infection free. He is undergoing occupational therapy and 120 of forward elevation he has weakness in his abduction but this is secondary to the subperiosteal dissection of the lateral deltoid which will take approximately one year to recover  He has no signs of infection he is doing well with his pain medication and Valium for muscle relaxation  ROS negative   BP 120/78  Ht 5\' 8"  (1.727 m)  Wt 176 lb (79.833 kg)  BMI 26.76 kg/m2 Physical Exam(6) GENERAL: normal development   CDV: pulses are normal   Skin: normal  Psychiatric: awake, alert and oriented  Neuro: normal sensation  He has control of the neuralgia  that he was feeling in his left upper extremity as well.  Continue current medications and occupational therapy and followup in 2 months

## 2012-10-12 NOTE — Patient Instructions (Addendum)
Continue therapy

## 2012-10-13 ENCOUNTER — Inpatient Hospital Stay (HOSPITAL_COMMUNITY): Admission: RE | Admit: 2012-10-13 | Payer: Medicaid Other | Source: Ambulatory Visit | Admitting: Occupational Therapy

## 2012-10-28 ENCOUNTER — Other Ambulatory Visit: Payer: Self-pay | Admitting: Orthopedic Surgery

## 2012-10-28 DIAGNOSIS — M25519 Pain in unspecified shoulder: Secondary | ICD-10-CM

## 2012-10-28 MED ORDER — HYDROCODONE-ACETAMINOPHEN 10-325 MG PO TABS: 1.0000 | ORAL_TABLET | ORAL | Status: DC | PRN

## 2012-11-01 ENCOUNTER — Other Ambulatory Visit: Payer: Self-pay | Admitting: Orthopedic Surgery

## 2012-11-01 DIAGNOSIS — M62838 Other muscle spasm: Secondary | ICD-10-CM

## 2012-11-01 MED ORDER — DIAZEPAM 10 MG PO TABS: 10.0000 mg | ORAL_TABLET | Freq: Four times a day (QID) | ORAL | Status: DC | PRN

## 2012-11-30 ENCOUNTER — Other Ambulatory Visit: Payer: Self-pay | Admitting: Orthopedic Surgery

## 2012-11-30 DIAGNOSIS — M62838 Other muscle spasm: Secondary | ICD-10-CM

## 2012-11-30 MED ORDER — DIAZEPAM 10 MG PO TABS: 10.0000 mg | ORAL_TABLET | Freq: Four times a day (QID) | ORAL | Status: DC | PRN

## 2012-12-14 ENCOUNTER — Ambulatory Visit: Payer: Medicaid Other | Admitting: Orthopedic Surgery

## 2012-12-20 ENCOUNTER — Other Ambulatory Visit: Payer: Self-pay | Admitting: Orthopedic Surgery

## 2012-12-20 DIAGNOSIS — M62838 Other muscle spasm: Secondary | ICD-10-CM

## 2012-12-20 MED ORDER — DIAZEPAM 10 MG PO TABS: 10.0000 mg | ORAL_TABLET | Freq: Four times a day (QID) | ORAL | Status: DC | PRN

## 2012-12-22 ENCOUNTER — Emergency Department (HOSPITAL_COMMUNITY): Payer: Medicaid Other

## 2012-12-22 ENCOUNTER — Emergency Department (HOSPITAL_COMMUNITY)
Admission: EM | Admit: 2012-12-22 | Discharge: 2012-12-22 | Disposition: A | Discharge status: Home / Self Care | Payer: Medicaid Other | Attending: Emergency Medicine | Admitting: Emergency Medicine

## 2012-12-22 ENCOUNTER — Encounter (HOSPITAL_COMMUNITY): Payer: Self-pay

## 2012-12-22 DIAGNOSIS — IMO0002 Reserved for concepts with insufficient information to code with codable children: Secondary | ICD-10-CM | POA: Insufficient documentation

## 2012-12-22 DIAGNOSIS — F411 Generalized anxiety disorder: Secondary | ICD-10-CM | POA: Insufficient documentation

## 2012-12-22 DIAGNOSIS — F329 Major depressive disorder, single episode, unspecified: Secondary | ICD-10-CM | POA: Insufficient documentation

## 2012-12-22 DIAGNOSIS — I10 Essential (primary) hypertension: Secondary | ICD-10-CM | POA: Insufficient documentation

## 2012-12-22 DIAGNOSIS — F3289 Other specified depressive episodes: Secondary | ICD-10-CM | POA: Insufficient documentation

## 2012-12-22 DIAGNOSIS — S46909A Unspecified injury of unspecified muscle, fascia and tendon at shoulder and upper arm level, unspecified arm, initial encounter: Secondary | ICD-10-CM | POA: Insufficient documentation

## 2012-12-22 DIAGNOSIS — R569 Unspecified convulsions: Secondary | ICD-10-CM

## 2012-12-22 DIAGNOSIS — X58XXXA Exposure to other specified factors, initial encounter: Secondary | ICD-10-CM | POA: Insufficient documentation

## 2012-12-22 DIAGNOSIS — R404 Transient alteration of awareness: Secondary | ICD-10-CM | POA: Insufficient documentation

## 2012-12-22 DIAGNOSIS — Z87891 Personal history of nicotine dependence: Secondary | ICD-10-CM | POA: Insufficient documentation

## 2012-12-22 DIAGNOSIS — Y9289 Other specified places as the place of occurrence of the external cause: Secondary | ICD-10-CM | POA: Insufficient documentation

## 2012-12-22 DIAGNOSIS — S7000XA Contusion of unspecified hip, initial encounter: Secondary | ICD-10-CM | POA: Insufficient documentation

## 2012-12-22 DIAGNOSIS — G40909 Epilepsy, unspecified, not intractable, without status epilepticus: Secondary | ICD-10-CM | POA: Insufficient documentation

## 2012-12-22 DIAGNOSIS — Y9389 Activity, other specified: Secondary | ICD-10-CM | POA: Insufficient documentation

## 2012-12-22 DIAGNOSIS — Z9889 Other specified postprocedural states: Secondary | ICD-10-CM | POA: Insufficient documentation

## 2012-12-22 DIAGNOSIS — Z7982 Long term (current) use of aspirin: Secondary | ICD-10-CM | POA: Insufficient documentation

## 2012-12-22 DIAGNOSIS — Z79899 Other long term (current) drug therapy: Secondary | ICD-10-CM | POA: Insufficient documentation

## 2012-12-22 DIAGNOSIS — S4980XA Other specified injuries of shoulder and upper arm, unspecified arm, initial encounter: Secondary | ICD-10-CM | POA: Insufficient documentation

## 2012-12-22 DIAGNOSIS — F29 Unspecified psychosis not due to a substance or known physiological condition: Secondary | ICD-10-CM | POA: Insufficient documentation

## 2012-12-22 LAB — COMPREHENSIVE METABOLIC PANEL
AST: 15 U/L (ref 0–37)
CO2: 26 mEq/L (ref 19–32)
Calcium: 9.1 mg/dL (ref 8.4–10.5)
Creatinine, Ser: 0.86 mg/dL (ref 0.50–1.35)
GFR calc Af Amer: >90 mL/min (ref >90)
GFR calc non Af Amer: >90 mL/min (ref >90)
Glucose, Bld: 81 mg/dL (ref 70–99)
Total Protein: 6.8 g/dL (ref 6.0–8.3)

## 2012-12-22 LAB — CBC WITH DIFFERENTIAL/PLATELET
Basophils Absolute: 0 10*3/uL (ref 0.0–0.1)
Eosinophils Absolute: 0 10*3/uL (ref 0.0–0.7)
Eosinophils Relative: 0 % (ref 0–5)
HCT: 42.3 % (ref 39.0–52.0)
Lymphocytes Relative: 25 % (ref 12–46)
MCH: 32.7 pg (ref 26.0–34.0)
MCV: 95.3 fL (ref 78.0–100.0)
Monocytes Absolute: 0.5 10*3/uL (ref 0.1–1.0)
Platelets: 214 10*3/uL (ref 150–400)
RDW: 13.1 % (ref 11.5–15.5)
WBC: 9.6 10*3/uL (ref 4.0–10.5)

## 2012-12-22 MED ORDER — SODIUM CHLORIDE 0.9 % IV SOLN
Freq: Once | INTRAVENOUS | Status: AC
  Administered 2012-12-22: 11:00:00 via INTRAVENOUS

## 2012-12-22 MED ORDER — MORPHINE SULFATE 4 MG/ML IJ SOLN
4.0000 mg | Freq: Once | INTRAMUSCULAR | Status: AC
  Administered 2012-12-22: 4 mg via INTRAVENOUS
  Filled 2012-12-22: qty 1

## 2012-12-22 MED ORDER — ACETAMINOPHEN 325 MG PO TABS
650.0000 mg | ORAL_TABLET | Freq: Once | ORAL | Status: AC
  Administered 2012-12-22: 650 mg via ORAL
  Filled 2012-12-22: qty 2

## 2012-12-22 MED ORDER — SODIUM CHLORIDE 0.9 % IV SOLN
1000.0000 mg | Freq: Once | INTRAVENOUS | Status: AC
  Administered 2012-12-22: 1000 mg via INTRAVENOUS
  Filled 2012-12-22: qty 20

## 2012-12-22 NOTE — ED Notes (Signed)
Requests additional pain medications.

## 2012-12-22 NOTE — ED Notes (Signed)
Patient states that he thinks he had a seizure this morning around 4 am.  States that he injured his right hip and lower back.  Large area of bruising noted to lateral right thigh and lower back.

## 2012-12-22 NOTE — ED Provider Notes (Signed)
History  This chart was scribed for [name], MD by Shari Heritage, ED Scribe. The patient was seen in room APA01/APA01. Patient's care was started at 1016.  CSN: [id number]  Arrival date & time 12/22/12  1308   First MD Initiated Contact with Patient 12/22/12 1016      Chief Complaint  Patient presents with  . Seizures     Patient is a 32 y.o. male presenting with seizures. The history is provided by the patient. No language interpreter was used.  Seizures  This is a recurrent problem. The current episode started 6 to 12 hours ago. The problem has been resolved. There was 1 seizure. Duration: unknown. Associated symptoms include confusion. Pertinent negatives include no headaches, no chest pain, no cough and no diarrhea. Characteristics include loss of consciousness. There has been no fever.     HPI Comments: Alan Jennings is a 32 y.o. male who presents to the Emergency Department complaining of constant, moderate, dull right hip pain and bruising, lower back pain and left arm pain resulting from a fall with unwitnessed seizure and LOC that occurred 7 hours ago. Patient states that he may have had a seizure this morning when he was taking his dog out for a walk. Patient remembers regaining consciousness outside with pains, bruises and abrasions to multiple areas and says he has been confused since that time. Patient's father states that patient came inside and woke him up claiming that he had a seizure, and says that patient was confusing details like his dog's name. Patient says that he has more frequent seizures with increased stress and anxiety, but he hasn't had once prior to today for 6 months. Patient says that he has increased anxiety recently due to his grandmother's passing a 2 weeks ago. Patient takes dilantin 100 mg 2x per day. He says that he has to get his prescription filled today after he took his last one yesterday.    Past Medical History  Diagnosis Date  . Anxiety     . Seizures   . Depression   . Seizures   . Hypertension   . Complication of anesthesia     had a syncopal episode after rhinoplasty    Past Surgical History  Procedure Date  . Nose surgery     [facility]  . Rhinoplasty     [facility]  . Orif humerus fracture 04/26/2012    Procedure: OPEN REDUCTION INTERNAL FIXATION (ORIF) PROXIMAL HUMERUS FRACTURE;  Surgeon: Vickki Hearing, MD;  Location: [facility];  Service: Orthopedics;  Laterality: Left;  Open Reduction Internal Fixation of Left Proximal Humerus Fracture, Closing Wedge Osteotomy, Bone Graft  . Shoulder surgery     Family History  Problem Relation Age of Onset  . Heart disease    . Arthritis    . Anesthesia problems Neg Hx   . Hypotension Neg Hx   . Malignant hyperthermia Neg Hx   . Pseudochol deficiency Neg Hx     History  Substance Use Topics  . Smoking status: Former Smoker -- 0.5 packs/day for 7 years    Types: Cigarettes    Quit date: 09/19/2011  . Smokeless tobacco: Never Used  . Alcohol Use: Yes     Comment: weekend beer      Review of Systems  Constitutional: Negative for fatigue.  HENT: Negative for congestion, sinus pressure and ear discharge.   Eyes: Negative for discharge.  Respiratory: Negative for cough.   Cardiovascular: Negative for chest pain.  Gastrointestinal:  Negative for abdominal pain and diarrhea.  Genitourinary: Negative for frequency and hematuria.  Musculoskeletal: Negative for back pain.  Skin: Negative for rash.  Neurological: Positive for seizures and loss of consciousness. Negative for headaches.  Hematological: Negative.   Psychiatric/Behavioral: Positive for confusion. Negative for hallucinations.    Allergies  Tramadol  Home Medications   Current Outpatient Rx  Name  Route  Sig  Dispense  Refill  . GOODYS BODY PAIN PO   Oral   Take by mouth.         . ATENOLOL-CHLORTHALIDONE 50-25 MG PO TABS   Oral   Take 1 tablet by mouth every morning.          Marland Kitchen DIAZEPAM 10 MG PO  TABS   Oral   Take 1 tablet (10 mg total) by mouth every 6 (six) hours as needed for anxiety.   90 tablet   0   . GABAPENTIN 100 MG PO CAPS   Oral   Take 3 capsules (300 mg total) by mouth 3 (three) times daily.   60 capsule   2   . HYDROCODONE-ACETAMINOPHEN 10-325 MG PO TABS   Oral   Take 1 tablet by mouth every 4 (four) hours as needed.   84 tablet   5   . IBUPROFEN PO   Oral   Take by mouth.         . MORPHINE SULFATE ER 15 MG PO TBCR   Oral   Take 1 tablet (15 mg total) by mouth 2 (two) times daily.   14 tablet   0   . FISH OIL 1000 MG PO CAPS   Oral   Take 1,000 mg by mouth 2 (two) times daily.         Marland Kitchen PHENYTOIN SODIUM EXTENDED 100 MG PO CAPS   Oral   Take 100 mg by mouth daily.         Marland Kitchen VANCOMYCIN IVPB   Intravenous   Inject 1,750 mg into the vein every 12 (twelve) hours.   1750 mg   14     Triage Vitals: BP 151/94  Pulse 85  Temp 98.3 F (36.8 C) (Oral)  Resp 18  Ht 5\' 9"  (1.753 m)  Wt 176 lb (79.833 kg)  BMI 25.99 kg/m2  SpO2 100%  Physical Exam  Constitutional: He is oriented to person, place, and time. He appears well-developed.  HENT:  Head: Normocephalic and atraumatic.  Eyes: Conjunctivae normal and EOM are normal. No scleral icterus.  Neck: Neck supple. No thyromegaly present.  Cardiovascular: Normal rate and regular rhythm.  Exam reveals no gallop and no friction rub.   No murmur heard. Pulmonary/Chest: No stridor. He has no wheezes. He has no rales. He exhibits no tenderness.  Abdominal: He exhibits no distension. There is no tenderness. There is no rebound.  Musculoskeletal: Normal range of motion. He exhibits tenderness. He exhibits no edema.       Bruising to right hip. Tenderness to left shoulder. Minor tenderness to lumbar spine.  Lymphadenopathy:    He has no cervical adenopathy.  Neurological: He is oriented to person, place, and time. Coordination normal.  Skin: No rash noted. No erythema.  Psychiatric: He has a  normal mood and affect. His behavior is normal.    ED Course  Procedures (including critical care time) DIAGNOSTIC STUDIES: Oxygen Saturation is 100% on room air, normal by my interpretation.    COORDINATION OF CARE: 10:25 AM- Patient informed of current plan for treatment  and evaluation and agrees with plan at this time.   Results for orders placed during the hospital encounter of 12/22/12  PHENYTOIN LEVEL, TOTAL      Component Value Range   Phenytoin Lvl <2.5 (*) 10.0 - 20.0 ug/mL  CBC WITH DIFFERENTIAL      Component Value Range   WBC 9.6  4.0 - 10.5 K/uL   RBC 4.44  4.22 - 5.81 MIL/uL   Hemoglobin 14.5  13.0 - 17.0 g/dL   HCT 16.1  09.6 - 04.5 %   MCV 95.3  78.0 - 100.0 fL   MCH 32.7  26.0 - 34.0 pg   MCHC 34.3  30.0 - 36.0 g/dL   RDW 40.9  81.1 - 91.4 %   Platelets 214  150 - 400 K/uL   Neutrophils Relative 69  43 - 77 %   Neutro Abs 6.6  1.7 - 7.7 K/uL   Lymphocytes Relative 25  12 - 46 %   Lymphs Abs 2.4  0.7 - 4.0 K/uL   Monocytes Relative 5  3 - 12 %   Monocytes Absolute 0.5  0.1 - 1.0 K/uL   Eosinophils Relative 0  0 - 5 %   Eosinophils Absolute 0.0  0.0 - 0.7 K/uL   Basophils Relative 0  0 - 1 %   Basophils Absolute 0.0  0.0 - 0.1 K/uL  COMPREHENSIVE METABOLIC PANEL      Component Value Range   Sodium 138  135 - 145 mEq/L   Potassium 3.2 (*) 3.5 - 5.1 mEq/L   Chloride 104  96 - 112 mEq/L   CO2 26  19 - 32 mEq/L   Glucose, Bld 81  70 - 99 mg/dL   BUN 13  6 - 23 mg/dL   Creatinine, Ser 7.82  0.50 - 1.35 mg/dL   Calcium 9.1  8.4 - 95.6 mg/dL   Total Protein 6.8  6.0 - 8.3 g/dL   Albumin 4.0  3.5 - 5.2 g/dL   AST 15  0 - 37 U/L   ALT 19  0 - 53 U/L   Alkaline Phosphatase 63  39 - 117 U/L   Total Bilirubin 0.3  0.3 - 1.2 mg/dL   GFR calc non Af Amer >90  >90 mL/min   GFR calc Af Amer >90  >90 mL/min   Dg Lumbar Spine Complete  12/22/2012  *RADIOLOGY REPORT*  Clinical Data: Fall this morning with low back pain.  LUMBAR SPINE - COMPLETE 4+ VIEW  Comparison:  11/04/2011.  Findings: Alignment anatomic.  Vertebral body and disc space height are maintained.  No degenerative changes.  No definite pars defects.  IMPRESSION: Negative.   Original Report Authenticated By: Leanna Battles, M.D.    Dg Hip Complete Right  12/22/2012  *RADIOLOGY REPORT*  Clinical Data: Fall with right hip pain.  Large hematoma along right hip.  RIGHT HIP - COMPLETE 2+ VIEW  Comparison: 12/22/2012.  Findings: No fracture or dislocation.  No significant degenerative changes.  No change from earlier today.  IMPRESSION: Negative.   Original Report Authenticated By: Leanna Battles, M.D.    Ct Head Wo Contrast  12/22/2012  *RADIOLOGY REPORT*  Clinical Data: Seizure  CT HEAD WITHOUT CONTRAST  Technique:  Contiguous axial images were obtained from the base of the skull through the vertex without contrast.  Comparison: 12/14/2011  Findings: The brain has a normal appearance without evidence of malformation, atrophy, old or acute infarction, mass lesion, hemorrhage, hydrocephalus or extra-axial collection.  The  calvarium is unremarkable.  Visualized sinuses, middle ears and mastoids are clear.  IMPRESSION: Normal head CT   Original Report Authenticated By: Paulina Fusi, M.D.    Dg Shoulder Left  12/22/2012  *RADIOLOGY REPORT*  Clinical Data: Fall with left shoulder pain.  LEFT SHOULDER - 2+ VIEW  Comparison: 12/22/2012.  Findings: No acute fracture or dislocation.  Healing fracture of left humeral neck is seen with lateral plate and screws. Acromioclavicular joint is intact.  Visualized portion of the left chest is unremarkable.  IMPRESSION:  1.  No acute findings. 2.  Healing left humeral neck fracture.   Original Report Authenticated By: Leanna Battles, M.D.     No diagnosis found.  Pt now admits he has not been taking his dilantin appropriately  MDM       The chart was scribed for me under my direct supervision.  I personally performed the history, physical, and medical decision making and  all procedures in the evaluation of this patient.[name], MD 12/22/12 [phone]

## 2012-12-22 NOTE — ED Notes (Signed)
Requests pain medication.

## 2012-12-22 NOTE — ED Notes (Addendum)
Pt ambulatory in department to restroom, unhooked himself from the monitor, states he had to "go."  Pt advised to please use his call bell if he needs assistance and to not get up on his own.  Pt states he is dizzy.

## 2012-12-22 NOTE — ED Notes (Signed)
Continues to complain of 10/10 pain, states the pain medication did not help him at all, MD made aware.

## 2012-12-22 NOTE — ED Notes (Signed)
Father reports pt woke him up this am around 0430 because he thought he had a seizure.  Pt c/o pain in lower back and r hip.  Pt has large bruise to r hip.  Pt has been confused since then.  Reports has been taking his seizure medication as directed.  Father says for the past 2 or 3 days he has noticed pt being confused.

## 2012-12-22 NOTE — ED Notes (Signed)
Refused wheelchair to waiting area

## 2012-12-28 ENCOUNTER — Ambulatory Visit (INDEPENDENT_AMBULATORY_CARE_PROVIDER_SITE_OTHER): Payer: Medicaid Other | Admitting: Orthopedic Surgery

## 2012-12-28 VITALS — BP 130/70 | Ht 68.0 in | Wt 176.0 lb

## 2012-12-28 DIAGNOSIS — M25519 Pain in unspecified shoulder: Secondary | ICD-10-CM

## 2012-12-28 MED ORDER — HYDROCODONE-ACETAMINOPHEN 10-325 MG PO TABS: 1.0000 | ORAL_TABLET | ORAL | Status: DC | PRN

## 2012-12-28 NOTE — Progress Notes (Signed)
Patient ID: Alan Jennings, male   DOB: Sep 05, 1981, 32 y.o.   MRN: [id number] Chief Complaint  Patient presents with  . Follow-up    2 month f/u left arm   DOS 04/26/12     He is status post multiple surgeries on his left arm status post malunion of proximal humerus fracture treated with osteotomy open treatment internal fixation complicated by wound infection treated with IV antibiotics.  He is now infection free. He is undergoing occupational therapy and 120 of forward elevation he has weakness in his abduction but this is secondary to the subperiosteal dissection of the lateral deltoid which will take approximately one year to recover

## 2013-01-10 ENCOUNTER — Other Ambulatory Visit: Payer: Self-pay | Admitting: *Deleted

## 2013-01-10 DIAGNOSIS — M62838 Other muscle spasm: Secondary | ICD-10-CM

## 2013-01-10 MED ORDER — DIAZEPAM 10 MG PO TABS: 10.0000 mg | ORAL_TABLET | Freq: Three times a day (TID) | ORAL | Status: DC | PRN

## 2013-01-31 ENCOUNTER — Other Ambulatory Visit: Payer: Self-pay | Admitting: *Deleted

## 2013-01-31 DIAGNOSIS — M62838 Other muscle spasm: Secondary | ICD-10-CM

## 2013-01-31 MED ORDER — DIAZEPAM 10 MG PO TABS: 10.0000 mg | ORAL_TABLET | Freq: Three times a day (TID) | ORAL | Status: DC | PRN

## 2013-02-21 ENCOUNTER — Other Ambulatory Visit: Payer: Self-pay | Admitting: Orthopedic Surgery

## 2013-02-21 DIAGNOSIS — M62838 Other muscle spasm: Secondary | ICD-10-CM

## 2013-02-21 MED ORDER — DIAZEPAM 10 MG PO TABS: 10.0000 mg | ORAL_TABLET | Freq: Three times a day (TID) | ORAL | Status: DC | PRN

## 2013-03-14 ENCOUNTER — Other Ambulatory Visit: Payer: Self-pay | Admitting: *Deleted

## 2013-03-14 DIAGNOSIS — M62838 Other muscle spasm: Secondary | ICD-10-CM

## 2013-03-14 MED ORDER — DIAZEPAM 10 MG PO TABS: 10.0000 mg | ORAL_TABLET | Freq: Three times a day (TID) | ORAL | Status: DC | PRN

## 2013-03-30 ENCOUNTER — Telehealth: Payer: Self-pay | Admitting: *Deleted

## 2013-03-30 NOTE — Telephone Encounter (Signed)
Refill request:Hydrocodone - Acetaminophn 10-325

## 2013-04-01 ENCOUNTER — Other Ambulatory Visit: Payer: Self-pay | Admitting: Orthopedic Surgery

## 2013-04-01 DIAGNOSIS — M25512 Pain in left shoulder: Secondary | ICD-10-CM

## 2013-04-01 MED ORDER — HYDROCODONE-ACETAMINOPHEN 10-325 MG PO TABS: 1.0000 | ORAL_TABLET | ORAL | Status: DC | PRN

## 2013-04-04 ENCOUNTER — Other Ambulatory Visit: Payer: Self-pay | Admitting: *Deleted

## 2013-04-04 DIAGNOSIS — M25512 Pain in left shoulder: Secondary | ICD-10-CM

## 2013-04-04 DIAGNOSIS — M62838 Other muscle spasm: Secondary | ICD-10-CM

## 2013-04-04 MED ORDER — DIAZEPAM 10 MG PO TABS: 10.0000 mg | ORAL_TABLET | Freq: Three times a day (TID) | ORAL | Status: DC | PRN

## 2013-04-04 MED ORDER — HYDROCODONE-ACETAMINOPHEN 10-325 MG PO TABS: 1.0000 | ORAL_TABLET | ORAL | Status: DC | PRN

## 2013-04-25 ENCOUNTER — Other Ambulatory Visit: Payer: Self-pay | Admitting: Orthopedic Surgery

## 2013-04-25 DIAGNOSIS — M62838 Other muscle spasm: Secondary | ICD-10-CM

## 2013-04-25 MED ORDER — DIAZEPAM 10 MG PO TABS: 10.0000 mg | ORAL_TABLET | Freq: Three times a day (TID) | ORAL | Status: DC | PRN

## 2013-04-27 ENCOUNTER — Ambulatory Visit: Payer: Medicaid Other | Admitting: Orthopedic Surgery

## 2013-05-05 ENCOUNTER — Ambulatory Visit: Payer: Medicaid Other | Admitting: Orthopedic Surgery

## 2013-05-26 ENCOUNTER — Encounter: Payer: Self-pay | Admitting: Orthopedic Surgery

## 2013-05-26 ENCOUNTER — Ambulatory Visit (INDEPENDENT_AMBULATORY_CARE_PROVIDER_SITE_OTHER): Payer: Medicaid Other | Admitting: Orthopedic Surgery

## 2013-05-26 VITALS — BP 132/80 | Ht 68.0 in | Wt 137.0 lb

## 2013-05-26 DIAGNOSIS — M25512 Pain in left shoulder: Secondary | ICD-10-CM

## 2013-05-26 DIAGNOSIS — M25519 Pain in unspecified shoulder: Secondary | ICD-10-CM

## 2013-05-26 DIAGNOSIS — F411 Generalized anxiety disorder: Secondary | ICD-10-CM

## 2013-05-26 MED ORDER — DIAZEPAM 5 MG PO TABS: 5.0000 mg | ORAL_TABLET | Freq: Three times a day (TID) | ORAL | Status: DC

## 2013-05-26 MED ORDER — HYDROCODONE-ACETAMINOPHEN 10-325 MG PO TABS: 1.0000 | ORAL_TABLET | ORAL | Status: DC | PRN

## 2013-05-26 NOTE — Patient Instructions (Addendum)
CONTINUE VALIUM 5 MG Q8 PRN   AND NORCO 10 Q4 PRN

## 2013-05-26 NOTE — Progress Notes (Signed)
Patient ID: Alan Jennings, male   DOB: 20-Oct-1981, 32 y.o.   MRN: [id number] Chief Complaint  Patient presents with  . Follow-up    4 month recheck on left arm. DOS 04-26-12.    History he status post multiple surgeries on his left arm's secondary to malunion of a nonoperatively treated proximal humerus fracture socially treated with osteotomy open treatment internal fixation conjugated by wound infection which was treated with IV antibiotics via a PICC line his last surgery was 04/26/2012  Is now infection free stone while he has 120 of forward elevation 70 of abduction mild discomfort primarily at night and primarily after overuse  He denies any numbness or tingling and he is doing well with Norco 10 mg and Valium 10 mg which he takes once or twice a day depending on the symptoms in his arm  BP 132/80  Ht 5\' 8"  (1.727 m)  Wt 137 lb (62.143 kg)  BMI 20.84 kg/m2 General appearance is normal, the patient is alert and oriented x3 with normal mood and affect. His incision has healed he has one area of just short of the size of a dime which healed over from the infection. He has 70 of abduction with mild weakness hip 120 of measured 4 elevation. He has crepitance on range of motion. He has good supraspinatus strength weak deltoid strength. Normal sensation and normal pulses distally  Continue Norco 10 mg decrease for him to 5 mg every 8 when necessary  Followup in 6 months

## 2013-06-22 ENCOUNTER — Emergency Department (HOSPITAL_COMMUNITY)
Admission: EM | Admit: 2013-06-22 | Discharge: 2013-06-22 | Disposition: A | Discharge status: Home / Self Care | Payer: Medicaid Other | Attending: Emergency Medicine | Admitting: Emergency Medicine

## 2013-06-22 ENCOUNTER — Emergency Department (HOSPITAL_COMMUNITY): Payer: Medicaid Other

## 2013-06-22 ENCOUNTER — Encounter (HOSPITAL_COMMUNITY): Payer: Self-pay

## 2013-06-22 DIAGNOSIS — Z79899 Other long term (current) drug therapy: Secondary | ICD-10-CM | POA: Insufficient documentation

## 2013-06-22 DIAGNOSIS — G8929 Other chronic pain: Secondary | ICD-10-CM | POA: Insufficient documentation

## 2013-06-22 DIAGNOSIS — Z87891 Personal history of nicotine dependence: Secondary | ICD-10-CM | POA: Insufficient documentation

## 2013-06-22 DIAGNOSIS — M549 Dorsalgia, unspecified: Secondary | ICD-10-CM

## 2013-06-22 DIAGNOSIS — G40909 Epilepsy, unspecified, not intractable, without status epilepticus: Secondary | ICD-10-CM | POA: Insufficient documentation

## 2013-06-22 DIAGNOSIS — I10 Essential (primary) hypertension: Secondary | ICD-10-CM | POA: Insufficient documentation

## 2013-06-22 DIAGNOSIS — F329 Major depressive disorder, single episode, unspecified: Secondary | ICD-10-CM | POA: Insufficient documentation

## 2013-06-22 DIAGNOSIS — F411 Generalized anxiety disorder: Secondary | ICD-10-CM | POA: Insufficient documentation

## 2013-06-22 DIAGNOSIS — M545 Low back pain, unspecified: Secondary | ICD-10-CM | POA: Insufficient documentation

## 2013-06-22 DIAGNOSIS — F3289 Other specified depressive episodes: Secondary | ICD-10-CM | POA: Insufficient documentation

## 2013-06-22 HISTORY — DX: Dorsalgia, unspecified: M54.9

## 2013-06-22 MED ORDER — KETOROLAC TROMETHAMINE 10 MG PO TABS
10.0000 mg | ORAL_TABLET | Freq: Once | ORAL | Status: AC
  Administered 2013-06-22: 10 mg via ORAL
  Filled 2013-06-22: qty 1

## 2013-06-22 MED ORDER — METHOCARBAMOL 500 MG PO TABS: 500.0000 mg | ORAL_TABLET | Freq: Three times a day (TID) | ORAL | Status: DC

## 2013-06-22 MED ORDER — DIAZEPAM 5 MG PO TABS
5.0000 mg | ORAL_TABLET | Freq: Once | ORAL | Status: AC
  Administered 2013-06-22: 5 mg via ORAL
  Filled 2013-06-22: qty 1

## 2013-06-22 MED ORDER — SULFAMETHOXAZOLE-TRIMETHOPRIM 200-40 MG/5ML PO SUSP: 10.0000 mL | Freq: Once | ORAL | Status: DC

## 2013-06-22 MED ORDER — DICLOFENAC SODIUM 75 MG PO TBEC: 75.0000 mg | DELAYED_RELEASE_TABLET | Freq: Two times a day (BID) | ORAL | Status: DC

## 2013-06-22 NOTE — ED Notes (Signed)
Pt reports chronic lower back pain.  Says pain has been more severe since Sunday night.

## 2013-06-22 NOTE — ED Provider Notes (Signed)
History    CSN: [id number] Arrival date & time 06/22/13  1716  First MD Initiated Contact with Patient 06/22/13 1731     Chief Complaint  Patient presents with  . Back Pain   (Consider location/radiation/quality/duration/timing/severity/associated sxs/prior Treatment) HPI Comments: Patient is a 32 year old male who presents to the emergency department with complaint of increasing back pain since July 6. The patient does not recall a fall, motor vehicle accident or injury. He has not had any previous operations or procedures involving his back. The patient has not had any loss of bowel or bladder control. He presents with increasing pain in the lower back area. He has tried [employer] with only minimal success.  The history is provided by the patient.   Past Medical History  Diagnosis Date  . Anxiety   . Seizures   . Depression   . Seizures   . Hypertension   . Complication of anesthesia     had a syncopal episode after rhinoplasty  . Back pain    Past Surgical History  Procedure Laterality Date  . Nose surgery      [facility]  . Rhinoplasty      [facility]  . Orif humerus fracture  04/26/2012    Procedure: OPEN REDUCTION INTERNAL FIXATION (ORIF) PROXIMAL HUMERUS FRACTURE;  Surgeon: Vickki Hearing, MD;  Location: [facility];  Service: Orthopedics;  Laterality: Left;  Open Reduction Internal Fixation of Left Proximal Humerus Fracture, Closing Wedge Osteotomy, Bone Graft  . Shoulder surgery     Family History  Problem Relation Age of Onset  . Heart disease    . Arthritis    . Anesthesia problems Neg Hx   . Hypotension Neg Hx   . Malignant hyperthermia Neg Hx   . Pseudochol deficiency Neg Hx    History  Substance Use Topics  . Smoking status: Former Smoker -- 0.50 packs/day for 7 years    Types: Cigarettes    Quit date: 09/19/2011  . Smokeless tobacco: Never Used  . Alcohol Use: No    Review of Systems  Constitutional: Negative for activity change.       All ROS Neg except  as noted in HPI  HENT: Negative for nosebleeds and neck pain.   Eyes: Negative for photophobia and discharge.  Respiratory: Negative for cough, shortness of breath and wheezing.   Cardiovascular: Negative for chest pain and palpitations.  Gastrointestinal: Negative for abdominal pain and blood in stool.  Genitourinary: Negative for dysuria, frequency and hematuria.  Musculoskeletal: Positive for back pain and arthralgias.  Skin: Negative.   Neurological: Positive for seizures. Negative for dizziness and speech difficulty.  Psychiatric/Behavioral: Negative for hallucinations and confusion.       Depression    Allergies  Tramadol  Home Medications   Current Outpatient Rx  Name  Route  Sig  Dispense  Refill  . Aspirin-Acetaminophen (GOODYS BODY PAIN PO)   Oral   Take 1 packet by mouth daily as needed. For pain         . diazepam (VALIUM) 5 MG tablet   Oral   Take 1 tablet (5 mg total) by mouth 3 (three) times daily.   90 tablet   0   . HYDROcodone-acetaminophen (NORCO) 10-325 MG per tablet   Oral   Take 1 tablet by mouth every 4 (four) hours as needed.   90 tablet   5   . phenytoin (DILANTIN) 100 MG ER capsule   Oral   Take 100 mg by  mouth daily.          BP 170/92  Pulse 130  Temp(Src) 98.8 F (37.1 C) (Oral)  Resp 18  Ht 5\' 8"  (1.727 m)  Wt 137 lb (62.143 kg)  BMI 20.84 kg/m2  SpO2 98% Physical Exam  Nursing note and vitals reviewed. Constitutional: He is oriented to person, place, and time. He appears well-developed and well-nourished.  Non-toxic appearance.  HENT:  Head: Normocephalic.  Right Ear: Tympanic membrane and external ear normal.  Left Ear: Tympanic membrane and external ear normal.  Eyes: EOM and lids are normal. Pupils are equal, round, and reactive to light.  Neck: Normal range of motion. Neck supple. Carotid bruit is not present.  Cardiovascular: Regular rhythm, normal heart sounds, intact distal pulses and normal pulses.   Heart rate  102 on my examination.  Pulmonary/Chest: Breath sounds normal. No respiratory distress.  Abdominal: Soft. Bowel sounds are normal. There is no tenderness. There is no guarding.  Musculoskeletal: Normal range of motion.  There is pain with attempted range of motion of the lower lumbar area. There is paraspinal tightness and discomfort with attempted range of motion. There no hot areas appreciated. There is no palpable step off.  Lymphadenopathy:       Head (right side): No submandibular adenopathy present.       Head (left side): No submandibular adenopathy present.    He has no cervical adenopathy.  Neurological: He is alert and oriented to person, place, and time. He has normal strength. No cranial nerve deficit or sensory deficit.  No motor, sensory, or gross neurologic deficits appreciated on examination. Gait is steady.  Skin: Skin is warm and dry.  Psychiatric: He has a normal mood and affect. His speech is normal.    ED Course  Procedures (including critical care time) Labs Reviewed - No data to display No results found. No diagnosis found.  MDM  I have reviewed nursing notes, vital signs, and all appropriate lab and imaging results for this patient. Patient presents to the emergency department with a chronic low back pain. States he is having more pain than usual since Sunday, July 6. The patient denies any recent lifting, pushing, pulling, or motor vehicle accident. He has not had any loss of any bowel or bladder function.  No gross neurologic deficits appreciated on examination. X-ray of the lumbar spine is negative for fracture, dislocation, or change and disc space.  Plan at this time is for the patient to apply heat to the lower back, prescription for Voltaren 75 mg twice a day with food, and Robaxin 500 mg 3 times daily for spasm given to the patient. The patient is advised to see Dr. Romeo Apple for orthopedic evaluation and management of this problem.  Kathie Dike,  PA-C 06/22/13 2022

## 2013-06-23 ENCOUNTER — Telehealth: Payer: Self-pay | Admitting: Orthopedic Surgery

## 2013-06-23 ENCOUNTER — Other Ambulatory Visit: Payer: Self-pay | Admitting: *Deleted

## 2013-06-23 DIAGNOSIS — F411 Generalized anxiety disorder: Secondary | ICD-10-CM

## 2013-06-23 MED ORDER — DIAZEPAM 5 MG PO TABS: 5.0000 mg | ORAL_TABLET | Freq: Three times a day (TID) | ORAL | Status: DC

## 2013-06-23 NOTE — Telephone Encounter (Signed)
Alan Jennings called to request an appointment for a [facility] ER follow-up for back pain.  Advised him he will have to go thru the [facility] Dept[facility] As they are his [place] [facility] provider and he will have to be referred by them.  Also told him Dr. Romeo Apple would have to review and approve the appointment for back pain if he is referred here.

## 2013-06-23 NOTE — ED Provider Notes (Signed)
Medical screening examination/treatment/procedure(s) were performed by non-physician practitioner and as supervising physician I was immediately available for consultation/collaboration.  Lannie Heaps, MD 06/23/13 0010 

## 2013-07-21 ENCOUNTER — Other Ambulatory Visit: Payer: Self-pay | Admitting: *Deleted

## 2013-07-21 DIAGNOSIS — F411 Generalized anxiety disorder: Secondary | ICD-10-CM

## 2013-07-21 MED ORDER — DIAZEPAM 5 MG PO TABS: 5.0000 mg | ORAL_TABLET | Freq: Three times a day (TID) | ORAL | Status: DC

## 2013-08-18 ENCOUNTER — Other Ambulatory Visit: Payer: Self-pay | Admitting: *Deleted

## 2013-08-18 DIAGNOSIS — F411 Generalized anxiety disorder: Secondary | ICD-10-CM

## 2013-08-18 MED ORDER — DIAZEPAM 5 MG PO TABS: 5.0000 mg | ORAL_TABLET | Freq: Three times a day (TID) | ORAL | Status: DC

## 2013-08-22 ENCOUNTER — Telehealth: Payer: Self-pay | Admitting: Orthopedic Surgery

## 2013-08-22 ENCOUNTER — Other Ambulatory Visit: Payer: Self-pay | Admitting: *Deleted

## 2013-08-22 DIAGNOSIS — M25512 Pain in left shoulder: Secondary | ICD-10-CM

## 2013-08-22 MED ORDER — HYDROCODONE-ACETAMINOPHEN 10-325 MG PO TABS: 1.0000 | ORAL_TABLET | ORAL | Status: DC | PRN

## 2013-08-22 NOTE — Telephone Encounter (Signed)
Noted  

## 2013-09-15 ENCOUNTER — Other Ambulatory Visit: Payer: Self-pay | Admitting: *Deleted

## 2013-09-15 DIAGNOSIS — F411 Generalized anxiety disorder: Secondary | ICD-10-CM

## 2013-09-15 MED ORDER — DIAZEPAM 5 MG PO TABS: 5.0000 mg | ORAL_TABLET | Freq: Three times a day (TID) | ORAL | Status: DC

## 2013-09-19 ENCOUNTER — Telehealth: Payer: Self-pay | Admitting: Orthopedic Surgery

## 2013-09-19 NOTE — Telephone Encounter (Signed)
Advised patient to call back next week per Dr. Romeo Apple.

## 2013-09-19 NOTE — Telephone Encounter (Signed)
Patient called to inquire about "how it will work" with his refill for medication, Hydrocodone/Norco 10-325, due to pharmaceutical changes regarding narcotic refills; states he has enough through next week.  His ph# is [phone].

## 2013-09-19 NOTE — Telephone Encounter (Signed)
Routing to Dr Harrison 

## 2013-09-19 NOTE — Telephone Encounter (Signed)
Please refer him to pain management and let him know that we're doing so

## 2013-09-26 ENCOUNTER — Other Ambulatory Visit: Payer: Self-pay | Admitting: *Deleted

## 2013-09-26 DIAGNOSIS — M25512 Pain in left shoulder: Secondary | ICD-10-CM

## 2013-09-26 MED ORDER — HYDROCODONE-ACETAMINOPHEN 10-325 MG PO TABS: 1.0000 | ORAL_TABLET | ORAL | Status: DC | PRN

## 2013-09-26 NOTE — Telephone Encounter (Signed)
I called and spoke with Mindi Junker, from [facility] Department, I advised her that patient needed to be referred to pain management clinic, however due to patient having [place] Medicaid, [place] [employer], they would need to make the referral. I advised the patient to call them so that they could make him an appointment for the referral.

## 2013-09-26 NOTE — Telephone Encounter (Signed)
[facility] Department will have to make referral to pain management, due to patient has [place] Medicaid, [place] assess. I contacted the Health Department to make them aware, and I faxed our office notes to them. The patient was contacted to make an appointment with the Health Department.

## 2013-10-04 ENCOUNTER — Encounter (HOSPITAL_COMMUNITY): Payer: Self-pay | Admitting: Emergency Medicine

## 2013-10-04 ENCOUNTER — Emergency Department (HOSPITAL_COMMUNITY)
Admission: EM | Admit: 2013-10-04 | Discharge: 2013-10-04 | Discharge status: Against Medical Advice | Payer: Medicaid Other | Attending: Emergency Medicine | Admitting: Emergency Medicine

## 2013-10-04 DIAGNOSIS — G40909 Epilepsy, unspecified, not intractable, without status epilepticus: Secondary | ICD-10-CM | POA: Insufficient documentation

## 2013-10-04 DIAGNOSIS — Z79899 Other long term (current) drug therapy: Secondary | ICD-10-CM | POA: Insufficient documentation

## 2013-10-04 DIAGNOSIS — G43909 Migraine, unspecified, not intractable, without status migrainosus: Secondary | ICD-10-CM | POA: Insufficient documentation

## 2013-10-04 DIAGNOSIS — Z791 Long term (current) use of non-steroidal anti-inflammatories (NSAID): Secondary | ICD-10-CM | POA: Insufficient documentation

## 2013-10-04 DIAGNOSIS — Z87891 Personal history of nicotine dependence: Secondary | ICD-10-CM | POA: Insufficient documentation

## 2013-10-04 DIAGNOSIS — I10 Essential (primary) hypertension: Secondary | ICD-10-CM | POA: Insufficient documentation

## 2013-10-04 DIAGNOSIS — F411 Generalized anxiety disorder: Secondary | ICD-10-CM | POA: Insufficient documentation

## 2013-10-04 MED ORDER — DIPHENHYDRAMINE HCL 50 MG/ML IJ SOLN
25.0000 mg | Freq: Once | INTRAMUSCULAR | Status: AC
Start: 2013-10-04 — End: 2013-10-04
  Administered 2013-10-04: 25 mg via INTRAVENOUS
  Filled 2013-10-04: qty 1

## 2013-10-04 MED ORDER — SUMATRIPTAN SUCCINATE 6 MG/0.5ML ~~LOC~~ SOLN
6.0000 mg | Freq: Once | SUBCUTANEOUS | Status: AC
  Administered 2013-10-04: 6 mg via SUBCUTANEOUS
  Filled 2013-10-04: qty 0.5

## 2013-10-04 MED ORDER — METOCLOPRAMIDE HCL 5 MG/ML IJ SOLN
10.0000 mg | Freq: Once | INTRAMUSCULAR | Status: AC
  Administered 2013-10-04: 10 mg via INTRAVENOUS
  Filled 2013-10-04: qty 2

## 2013-10-04 MED ORDER — SODIUM CHLORIDE 0.9 % IV BOLUS (SEPSIS)
1000.0000 mL | Freq: Once | INTRAVENOUS | Status: AC
  Administered 2013-10-04: 1000 mL via INTRAVENOUS

## 2013-10-04 MED ORDER — KETOROLAC TROMETHAMINE 30 MG/ML IJ SOLN
30.0000 mg | Freq: Once | INTRAMUSCULAR | Status: AC
  Administered 2013-10-04: 30 mg via INTRAVENOUS
  Filled 2013-10-04: qty 1

## 2013-10-04 NOTE — ED Notes (Signed)
[place] PD called back stating that address provided by pt was a vacant house.  Art gallery manager is aquatinted with pt's mother and contacted mother and states mother will attempt to contact pt to inform pt to return to ED.  Pt has not returned RN's phone call.

## 2013-10-04 NOTE — Progress Notes (Signed)
ED/CM noted patient did not have health insurance and/or PCP listed in the computer.  Patient was given the [place] resource handout with information on the clinics, food pantries, and the handout for new health insurance sign-up.  Patient expressed appreciation for this. 

## 2013-10-04 NOTE — ED Notes (Signed)
RN to reassess pt - pt not in room.  Did not notify staff of elopement.  Called pt on number listed to verify if pt left, no answer, left message.

## 2013-10-04 NOTE — ED Provider Notes (Signed)
This chart was scribed for Alan Maw Ward, DO by Caryn Bee, ED Scribe. This patient was seen in room APA08/APA08.   TIME SEEN: 7:21  CHIEF COMPLAINT: Headache  HPI: Pt is a 32 y.o. M with h/o migraines, seizures complains of moderate, gradual onset, diffuse headache that began yesterday.  Pt states that it has been about 2-3 years since he's had a similar headache. The headache feels like someone is "pinching behind both eyes". He usually takes tylenol for pain with mild relief.  Light worsens the pain. Yesterday he began having associated episodes of emesis. He cannot keep any food or drinks down. Pt has h/o frequent seizures. Pt denies numbness, tingling, weakness, fever, neck pain or neck stiffness, history of head injury, sudden onset.  He takes prescription Dilantin regularly and is worried that his Dilantin level be low because he has not been able to keep his medications down.  ROS: See HPI Constitutional: no fever  Eyes: no drainage  ENT: no runny nose   Cardiovascular:  no chest pain  Resp: no SOB  GI: positive vomiting GU: no dysuria Integumentary: no rash  Allergy: no hives  Musculoskeletal: no leg swelling  Neurological: no slurred speech, no numbness, no weakness, positive headache ROS otherwise negative  PAST MEDICAL HISTORY/PAST SURGICAL HISTORY:  Past Medical History  Diagnosis Date  . Anxiety   . Seizures   . Depression   . Seizures   . Hypertension   . Complication of anesthesia     had a syncopal episode after rhinoplasty  . Back pain     MEDICATIONS:  Prior to Admission medications   Medication Sig Start Date End Date Taking? Authorizing Provider  Aspirin-Acetaminophen (GOODYS BODY PAIN PO) Take 1 packet by mouth daily as needed. For pain    Historical Provider, MD  diazepam (VALIUM) 5 MG tablet Take 1 tablet (5 mg total) by mouth 3 (three) times daily. 09/15/13   Vickki Hearing, MD  diclofenac (VOLTAREN) 75 MG EC tablet Take 1 tablet (75 mg total)  by mouth 2 (two) times daily. 06/22/13   Kathie Dike, PA-C  HYDROcodone-acetaminophen (NORCO) 10-325 MG per tablet Take 1 tablet by mouth every 4 (four) hours as needed. 09/26/13   Vickki Hearing, MD  methocarbamol (ROBAXIN) 500 MG tablet Take 1 tablet (500 mg total) by mouth 3 (three) times daily. 06/22/13   Kathie Dike, PA-C  phenytoin (DILANTIN) 100 MG ER capsule Take 100 mg by mouth daily.    Historical Provider, MD    ALLERGIES:  Allergies  Allergen Reactions  . Tramadol Other (See Comments)    SEIZURES    SOCIAL HISTORY:  History  Substance Use Topics  . Smoking status: Former Smoker -- 0.50 packs/day for 7 years    Types: Cigarettes    Quit date: 09/19/2011  . Smokeless tobacco: Never Used  . Alcohol Use: No    FAMILY HISTORY: Family History  Problem Relation Age of Onset  . Heart disease    . Arthritis    . Anesthesia problems Neg Hx   . Hypotension Neg Hx   . Malignant hyperthermia Neg Hx   . Pseudochol deficiency Neg Hx     EXAM: Triage Vitals:BP 141/91  Pulse 97  Temp(Src) 98.4 F (36.9 C) (Oral)  Resp 18  Ht 5\' 8"  (1.727 m)  Wt 133 lb (60.328 kg)  BMI 20.23 kg/m2  SpO2 100% CONSTITUTIONAL: Alert and oriented and responds appropriately to questions. Well-appearing; well-nourished HEAD: Normocephalic EYES:  Conjunctivae clear, PERRL ENT: normal nose; no rhinorrhea; moist mucous membranes; pharynx without lesions noted NECK: Supple, no meningismus, no LAD  CARD: RRR; S1 and S2 appreciated; no murmurs, no clicks, no rubs, no gallops RESP: Normal chest excursion without splinting or tachypnea; breath sounds clear and equal bilaterally; no wheezes, no rhonchi, no rales,  ABD/GI: Normal bowel sounds; non-distended; soft, non-tender, no rebound, no guarding BACK:  The back appears normal and is non-tender to palpation, there is no CVA tenderness EXT: Normal ROM in all joints; non-tender to palpation; no edema; normal capillary refill; no cyanosis     SKIN: Normal color for age and race; warm NEURO: Moves all extremities equally, strength 5/5 in all extremities, sensation to touch intact diffusely, cranial nerves 2-12 intact, no dismetria finger to nose testing PSYCH: The patient's mood and manner are appropriate. Grooming and personal hygiene are appropriate.  MEDICAL DECISION MAKING: Patient here with his typical migraine headache. No concern for meningitis, intracranial hemorrhage, infarct or other life-threatening illness. Will give migraine cocktail. We'll also check patient's Dilantin level in the ED.  ED PROGRESS:  10:24 AM- Pt is feeling somewhat better and states that he does not usually have migraines. Will try Imitrex, pt agrees.   12:00 PM  Patient left the emergency department AGAINST MEDICAL ADVICE. Explained earlier to patient that there was a high-volume patient's, high acuity. He verbalizes understanding. Apologized for his wait.  Dilantin level still pending.     Alan Maw Ward, DO 10/04/13 1523

## 2013-10-04 NOTE — ED Notes (Addendum)
H/A started that started Sunday. Nausea and vomiting since yesterday. Vomiting X5 in last 24 hours. Patient states that he is now dry heaving. Feels like someone is "pinching my eyes right behind both of them". Patient states diarrhea this morning around 2AM. Patient states that he can't keep any food, liquids, or medications down.

## 2013-10-04 NOTE — ED Notes (Signed)
Pt still has not shown up to verify IV removal.  [place] PD called to go to pt's home for verification and officer will call back.

## 2013-10-04 NOTE — ED Notes (Signed)
Pt called back and states he removed IV himself, no cath noted in trash in room.  Informed pt that RN needs to verify that IV has been removed.  Pt states he will come back for verification.

## 2013-10-04 NOTE — ED Notes (Signed)
Pending discharge until pt returns for IV removal verification.

## 2013-10-08 LAB — PHENYTOIN LEVEL, FREE AND TOTAL: Phenytoin, Total: <2.5 mg/L (ref 10.0–20.0)

## 2013-10-13 ENCOUNTER — Other Ambulatory Visit: Payer: Self-pay | Admitting: *Deleted

## 2013-10-13 DIAGNOSIS — F411 Generalized anxiety disorder: Secondary | ICD-10-CM

## 2013-10-13 MED ORDER — DIAZEPAM 5 MG PO TABS: 5.0000 mg | ORAL_TABLET | Freq: Three times a day (TID) | ORAL | Status: DC | PRN

## 2013-10-25 ENCOUNTER — Telehealth: Payer: Self-pay | Admitting: *Deleted

## 2013-10-25 NOTE — Telephone Encounter (Signed)
Patient called to inform us that he has an appointment with Dr. Gerilyn Pilgrim for pain management on  11/25/13 at 2:00 pm.

## 2013-10-26 ENCOUNTER — Telehealth: Payer: Self-pay | Admitting: Orthopedic Surgery

## 2013-10-26 NOTE — Telephone Encounter (Signed)
Patient says as of today he is out of Hydrocodone.  His appointment with Dr. Gerilyn Pilgrim for  Pain management is scheduled for 11-25-13. Can he get a prescription to get him thru until the appointment?

## 2013-10-26 NOTE — Telephone Encounter (Signed)
No never ever ever do we give a prescription outside of a pain management contract

## 2013-10-26 NOTE — Telephone Encounter (Signed)
Routed to nurse.

## 2013-10-26 NOTE — Telephone Encounter (Signed)
Patient informed of Dr. Harrison's reply. 

## 2013-10-31 ENCOUNTER — Other Ambulatory Visit: Payer: Self-pay | Admitting: *Deleted

## 2013-10-31 ENCOUNTER — Telehealth: Payer: Self-pay | Admitting: *Deleted

## 2013-10-31 DIAGNOSIS — M25512 Pain in left shoulder: Secondary | ICD-10-CM

## 2013-10-31 MED ORDER — HYDROCODONE-ACETAMINOPHEN 10-325 MG PO TABS: 1.0000 | ORAL_TABLET | ORAL | Status: DC | PRN

## 2013-10-31 NOTE — Telephone Encounter (Signed)
We received refill request for Hydrocodone 10-325 from [facility] pharmacy. I explained to Dr. Romeo Apple that Sharia Reeve has been for the Intake class for pain management with Dr. Gerilyn Pilgrim, however his first appointment is not until December, and I called Dr. Ronal Fear office to verify this. Dr. Romeo Apple said he would give him Hydrocodone 10-325 # 60 no refills to last him til his appointment. I called Josh and explained this to him, and I told him his prescription would be ready for pick up 11/01/13.

## 2013-11-24 ENCOUNTER — Ambulatory Visit (INDEPENDENT_AMBULATORY_CARE_PROVIDER_SITE_OTHER): Payer: Medicaid Other | Admitting: Orthopedic Surgery

## 2013-11-24 VITALS — BP 142/84 | Ht 68.0 in | Wt 140.0 lb

## 2013-11-24 DIAGNOSIS — M25512 Pain in left shoulder: Secondary | ICD-10-CM

## 2013-11-24 DIAGNOSIS — F411 Generalized anxiety disorder: Secondary | ICD-10-CM

## 2013-11-24 DIAGNOSIS — M25519 Pain in unspecified shoulder: Secondary | ICD-10-CM

## 2013-11-24 MED ORDER — HYDROCODONE-ACETAMINOPHEN 10-325 MG PO TABS: 1.0000 | ORAL_TABLET | ORAL | Status: DC | PRN

## 2013-11-24 MED ORDER — DIAZEPAM 5 MG PO TABS: 5.0000 mg | ORAL_TABLET | Freq: Three times a day (TID) | ORAL | Status: DC | PRN

## 2013-11-24 NOTE — Progress Notes (Signed)
Patient ID: Alan Jennings, male   DOB: 06/23/1981, 32 y.o.   MRN: [id number]  Chief Complaint  Patient presents with  . Follow-up    6 month recheck on left arm. DOS 04-26-12.    6 months check up since his last visit he had closed treatment of her proximal humerus fracture with malunion. We saw him in consultation recommended surgery. After osteotomy and plating he comes in for followup visit one and a half years or so post injury. He is doing well on Valium and Norco 10 mg.  Seizures syncope recurring we'll see a neurologist.  His incision looks clean his range of motion has improved his strength is getting better he has no instability his skin incision looks nice has good pulse and perfusion normal sensation no lymphadenopathy BP 142/84  Ht 5\' 8"  (1.727 m)  Wt 140 lb (63.504 kg)  BMI 21.29 kg/m2 General appearance is normal, the patient is alert and oriented x3 with normal mood and affect.  Stable fixation left arm return in May for x-ray humerus

## 2014-04-25 ENCOUNTER — Ambulatory Visit: Payer: Medicaid Other | Admitting: Orthopedic Surgery

## 2014-05-18 ENCOUNTER — Encounter: Payer: Self-pay | Admitting: Orthopedic Surgery

## 2014-05-18 ENCOUNTER — Ambulatory Visit: Payer: Medicaid Other | Admitting: Orthopedic Surgery

## 2014-07-06 ENCOUNTER — Emergency Department (HOSPITAL_COMMUNITY)
Admission: EM | Admit: 2014-07-06 | Discharge: 2014-07-06 | Disposition: A | Discharge status: Home / Self Care | Payer: Medicaid Other | Attending: Emergency Medicine | Admitting: Emergency Medicine

## 2014-07-06 ENCOUNTER — Encounter (HOSPITAL_COMMUNITY): Payer: Self-pay | Admitting: Emergency Medicine

## 2014-07-06 ENCOUNTER — Emergency Department (HOSPITAL_COMMUNITY): Payer: Medicaid Other

## 2014-07-06 DIAGNOSIS — Z87891 Personal history of nicotine dependence: Secondary | ICD-10-CM | POA: Diagnosis not present

## 2014-07-06 DIAGNOSIS — Y9389 Activity, other specified: Secondary | ICD-10-CM | POA: Diagnosis not present

## 2014-07-06 DIAGNOSIS — IMO0002 Reserved for concepts with insufficient information to code with codable children: Secondary | ICD-10-CM | POA: Diagnosis not present

## 2014-07-06 DIAGNOSIS — S46909A Unspecified injury of unspecified muscle, fascia and tendon at shoulder and upper arm level, unspecified arm, initial encounter: Secondary | ICD-10-CM | POA: Diagnosis present

## 2014-07-06 DIAGNOSIS — G40909 Epilepsy, unspecified, not intractable, without status epilepticus: Secondary | ICD-10-CM | POA: Insufficient documentation

## 2014-07-06 DIAGNOSIS — I10 Essential (primary) hypertension: Secondary | ICD-10-CM | POA: Insufficient documentation

## 2014-07-06 DIAGNOSIS — Z8659 Personal history of other mental and behavioral disorders: Secondary | ICD-10-CM | POA: Insufficient documentation

## 2014-07-06 DIAGNOSIS — Y929 Unspecified place or not applicable: Secondary | ICD-10-CM | POA: Insufficient documentation

## 2014-07-06 DIAGNOSIS — S4980XA Other specified injuries of shoulder and upper arm, unspecified arm, initial encounter: Secondary | ICD-10-CM | POA: Insufficient documentation

## 2014-07-06 DIAGNOSIS — S46912A Strain of unspecified muscle, fascia and tendon at shoulder and upper arm level, left arm, initial encounter: Secondary | ICD-10-CM

## 2014-07-06 DIAGNOSIS — Z79899 Other long term (current) drug therapy: Secondary | ICD-10-CM | POA: Insufficient documentation

## 2014-07-06 DIAGNOSIS — X500XXA Overexertion from strenuous movement or load, initial encounter: Secondary | ICD-10-CM | POA: Insufficient documentation

## 2014-07-06 MED ORDER — DICLOFENAC SODIUM 75 MG PO TBEC: 75.0000 mg | DELAYED_RELEASE_TABLET | Freq: Three times a day (TID) | ORAL | Status: DC

## 2014-07-06 MED ORDER — METHOCARBAMOL 500 MG PO TABS: 500.0000 mg | ORAL_TABLET | Freq: Two times a day (BID) | ORAL | Status: DC

## 2014-07-06 NOTE — ED Notes (Addendum)
Pt reports was standing up against a street sign and went to remove left arm from sign and hearda loud "pop". Limited ROM noted in triage. Distal pulses moderate. Cap refil <3 secs. Pt reports LUE feels "cool and numb." no obvious deformity noted.

## 2014-07-06 NOTE — ED Provider Notes (Signed)
CSN: [id number]     Arrival date & time 07/06/14  1404 History  This chart was scribed for Gilda Crease, * by Elon Spanner, ED Scribe. This patient was seen in room APA07/APA07 and the patient's care was started at 3:08 PM.    Chief Complaint  Patient presents with  . Arm Injury    The history is provided by the patient. No language interpreter was used.   HPI Comments: Alan Jennings is a 33 y.o. male who presents to the Emergency Department complaining of left shoulder pain that began suddenly earlier today after he raised his arm.  The patient states he heard a pop that sounded like a "firecracker" at the time of onset.  He describes the pain currently as a burning sensation and has associated numbness in the fingers of his left hand.  Patient reports he has difficulty raising his left arm.   He has a history of a plate implant in his left humerus but reports no complications.      Past Medical History  Diagnosis Date  . Anxiety   . Seizures   . Depression   . Seizures   . Hypertension   . Complication of anesthesia     had a syncopal episode after rhinoplasty  . Back pain    Past Surgical History  Procedure Laterality Date  . Nose surgery      [facility]  . Rhinoplasty      [facility]  . Orif humerus fracture  04/26/2012    Procedure: OPEN REDUCTION INTERNAL FIXATION (ORIF) PROXIMAL HUMERUS FRACTURE;  Surgeon: Vickki Hearing, MD;  Location: [facility];  Service: Orthopedics;  Laterality: Left;  Open Reduction Internal Fixation of Left Proximal Humerus Fracture, Closing Wedge Osteotomy, Bone Graft  . Shoulder surgery     Family History  Problem Relation Age of Onset  . Heart disease    . Arthritis    . Anesthesia problems Neg Hx   . Hypotension Neg Hx   . Malignant hyperthermia Neg Hx   . Pseudochol deficiency Neg Hx    History  Substance Use Topics  . Smoking status: Former Smoker -- 0.50 packs/day for 7 years    Types: Cigarettes    Quit date: 09/19/2011  . Smokeless  tobacco: Never Used  . Alcohol Use: No    Review of Systems  Musculoskeletal: Positive for arthralgias (left shoulder).  Neurological: Positive for numbness.  All other systems reviewed and are negative.     Allergies  Tramadol  Home Medications   Prior to Admission medications   Medication Sig Start Date End Date Taking? Authorizing Provider  acetaminophen (TYLENOL) 500 MG tablet Take 1,000 mg by mouth every 6 (six) hours as needed for pain.   Yes Historical Provider, MD  Aspirin-Acetaminophen (GOODYS BODY PAIN PO) Take 1 packet by mouth daily as needed (pain).    Yes Historical Provider, MD  oxymorphone (OPANA ER, CRUSH RESISTANT,) 10 MG T12A 12 hr tablet Take 10 mg by mouth every 12 (twelve) hours.   Yes Historical Provider, MD  pregabalin (LYRICA) 75 MG capsule Take 75 mg by mouth 2 (two) times daily.   Yes Historical Provider, MD  diclofenac (VOLTAREN) 75 MG EC tablet Take 1 tablet (75 mg total) by mouth 3 (three) times daily. 07/06/14   Gilda Crease, MD  methocarbamol (ROBAXIN) 500 MG tablet Take 1 tablet (500 mg total) by mouth 2 (two) times daily. 07/06/14   Gilda Crease, MD   BP  150/93  Pulse 136  Temp(Src) 99 F (37.2 C) (Oral)  Resp 18  Ht 5\' 8"  (1.727 m)  Wt 145 lb (65.772 kg)  BMI 22.05 kg/m2  SpO2 100% Physical Exam  Nursing note and vitals reviewed. Constitutional: He is oriented to person, place, and time. He appears well-developed. No distress.  HENT:  Head: Normocephalic and atraumatic.  Eyes: Conjunctivae and EOM are normal.  Cardiovascular: Normal rate.   Pulmonary/Chest: Effort normal. No stridor.  Abdominal: He exhibits no distension.  Musculoskeletal: He exhibits no edema.  Diffusely tender around left shoulder with no deformity.  Decreased ROM due to pain.   Neurological: He is alert and oriented to person, place, and time.  Skin: Skin is warm and dry.  Psychiatric: He has a normal mood and affect.    ED Course  Procedures  (including critical care time)  DIAGNOSTIC STUDIES: Oxygen Saturation is 100% on RA, normal by my interpretation.    COORDINATION OF CARE:  3:10 PM Discussed plan to sling arm and order pain medication.  Advised patient to return if pain worsens.  Patient acknowledges and agrees with plan.    Labs Review Labs Reviewed - No data to display  Imaging Review Dg Shoulder Left  07/06/2014   CLINICAL DATA:  Prior shoulder ORIF.  Shoulder pain.  EXAM: LEFT SHOULDER - 2+ VIEW  COMPARISON:  10/27/2013.  FINDINGS: Buttress plate and screw fixation of the proximal LEFT humerus is unchanged. No complicating features. Proximal humerus fracture is healed. Glenohumeral joint appears located. Axillary view not submitted for interpretation. Visualized LEFT chest appears normal.  IMPRESSION: No acute abnormality.  Old healed proximal LEFT humerus fracture.   Electronically Signed   By: Andreas NewportGeoffrey  Lamke M.D.   On: 07/06/2014 14:32     EKG Interpretation None      MDM   Final diagnoses:  Shoulder strain, left, initial encounter    Subacute onset left shoulder pain secondary to a benign movement of the shoulder. X-ray was negative. No evidence of fracture or dislocation. Patient has had previous bleeding of the humerus, no disruption of the prosthesis. Patient was told that he will likely improve over the next few days. Treat with NSAIDs and muscle relaxer in addition to his already prescribed Opana. Patient was told that he can followup with Doctor Romeo AppleHarrison who did his original surgery if he is not improved in 5 days. He was given a sling, and for that he needs to do range of motion exercises of the shoulder, if he uses the sling, to prevent frozen shoulder.  I personally performed the services described in this documentation, which was scribed in my presence. The recorded information has been reviewed and is accurate.     Gilda Creasehristopher J. Pollina, MD 07/06/14 2107

## 2014-07-06 NOTE — ED Notes (Signed)
Pt c/o hearing a pop in left shoulder after moving it, pt states that he was leaning against a street sign and when he went to move his left arm he heard a pop in left shoulder area, has hx of plates in place to left shoulder and is worried that something may have moved, pt states that the pain is increased with movement of left shoulder, cms intact distal, good cap refill noted,

## 2014-07-06 NOTE — Discharge Instructions (Signed)

## 2015-12-07 ENCOUNTER — Encounter (HOSPITAL_COMMUNITY): Payer: Self-pay | Admitting: Emergency Medicine

## 2015-12-07 ENCOUNTER — Emergency Department (HOSPITAL_COMMUNITY): Payer: Medicaid Other

## 2015-12-07 ENCOUNTER — Emergency Department (HOSPITAL_COMMUNITY)
Admission: EM | Admit: 2015-12-07 | Discharge: 2015-12-07 | Disposition: A | Discharge status: Home / Self Care | Payer: Medicaid Other | Attending: Emergency Medicine | Admitting: Emergency Medicine

## 2015-12-07 DIAGNOSIS — Z791 Long term (current) use of non-steroidal anti-inflammatories (NSAID): Secondary | ICD-10-CM | POA: Diagnosis not present

## 2015-12-07 DIAGNOSIS — F419 Anxiety disorder, unspecified: Secondary | ICD-10-CM | POA: Diagnosis not present

## 2015-12-07 DIAGNOSIS — Y9389 Activity, other specified: Secondary | ICD-10-CM | POA: Diagnosis not present

## 2015-12-07 DIAGNOSIS — W1849XA Other slipping, tripping and stumbling without falling, initial encounter: Secondary | ICD-10-CM | POA: Diagnosis not present

## 2015-12-07 DIAGNOSIS — S60221A Contusion of right hand, initial encounter: Secondary | ICD-10-CM | POA: Diagnosis not present

## 2015-12-07 DIAGNOSIS — Y998 Other external cause status: Secondary | ICD-10-CM | POA: Diagnosis not present

## 2015-12-07 DIAGNOSIS — I10 Essential (primary) hypertension: Secondary | ICD-10-CM | POA: Diagnosis not present

## 2015-12-07 DIAGNOSIS — Z79899 Other long term (current) drug therapy: Secondary | ICD-10-CM | POA: Insufficient documentation

## 2015-12-07 DIAGNOSIS — S6991XA Unspecified injury of right wrist, hand and finger(s), initial encounter: Secondary | ICD-10-CM | POA: Diagnosis present

## 2015-12-07 DIAGNOSIS — Y9289 Other specified places as the place of occurrence of the external cause: Secondary | ICD-10-CM | POA: Diagnosis not present

## 2015-12-07 DIAGNOSIS — Z87891 Personal history of nicotine dependence: Secondary | ICD-10-CM | POA: Insufficient documentation

## 2015-12-07 DIAGNOSIS — F329 Major depressive disorder, single episode, unspecified: Secondary | ICD-10-CM | POA: Insufficient documentation

## 2015-12-07 MED ORDER — HYDROCODONE-ACETAMINOPHEN 5-325 MG PO TABS: ORAL_TABLET | ORAL | Status: DC

## 2015-12-07 MED ORDER — NAPROXEN 500 MG PO TABS: 500.0000 mg | ORAL_TABLET | Freq: Two times a day (BID) | ORAL | Status: DC

## 2015-12-07 NOTE — Discharge Instructions (Signed)
Hand Contusion ° A hand contusion is a deep bruise to the hand. Contusions happen when an injury causes bleeding under the skin. Signs of bruising include pain, puffiness (swelling), and discolored skin. The contusion may turn blue, purple, or yellow. °HOME CARE °· Put ice on the injured area. °¨ Put ice in a plastic bag. °¨ Place a towel between your skin and the bag. °¨ Leave the ice on for 15-20 minutes, 03-04 times a day. °· Only take medicines as told by your doctor. °· Use an elastic wrap only as told. You may remove the wrap for sleeping, showering, and bathing. Take the wrap off if you lose feeling (have numbness) in your fingers, or they turn blue or cold. Put the wrap on more loosely. °· Keep the hand raised (elevated) with pillows. °· Avoid using your hand too much if it is painful. °GET HELP RIGHT AWAY IF:  °· You have more redness, puffiness, or pain in your hand. °· Your puffiness or pain does not get better with medicine. °· You lose feeling in your hand, or you cannot move your fingers. °· Your hand turns cold or blue. °· You have pain when you move your fingers. °· Your hand feels warm. °· Your contusion does not get better in 2 days. °MAKE SURE YOU:  °· Understand these instructions. °· Will watch this condition. °· Will get help right away if you are not doing well or you get worse. °  °This information is not intended to replace advice given to you by your health care provider. Make sure you discuss any questions you have with your health care provider. °  °Document Released: 05/19/2008 Document Revised: 12/22/2014 Document Reviewed: 05/24/2012 °Elsevier Interactive Patient Education ©2016 [employer] ° °

## 2015-12-07 NOTE — ED Notes (Addendum)
Pt reports tripping over his dog last night. States hand went through the wall and hit a metal stud. Pt noted to have edema, bruising, and redness to RT hand with a small laceration. Pt able to move all fingers. Cap refill brisk.

## 2015-12-10 NOTE — ED Provider Notes (Signed)
CSN: [id number]     Arrival date & time 12/07/15  1704 History   First MD Initiated Contact with Patient 12/07/15 1847     Chief Complaint  Patient presents with  . Hand Injury     (Consider location/radiation/quality/duration/timing/severity/associated sxs/prior Treatment) HPI  Alan Jennings is a 34 y.o. male who presents to the Emergency Department complaining of pain and swelling to the top of his right hand for one day.  He states that he tripped over a dog and as he tried to "catch" himself his hand struck a wall and his hand went through the drywall.  He reports immediate swelling and increased bruising.  Pain with movement of the hand and fourth and fifth fingers.  He has not tried any therapies for his symptoms.     Past Medical History  Diagnosis Date  . Anxiety   . Seizures (HCC)   . Depression   . Seizures (HCC)   . Hypertension   . Complication of anesthesia     had a syncopal episode after rhinoplasty  . Back pain    Past Surgical History  Procedure Laterality Date  . Nose surgery      [facility]  . Rhinoplasty      [facility]  . Orif humerus fracture  04/26/2012    Procedure: OPEN REDUCTION INTERNAL FIXATION (ORIF) PROXIMAL HUMERUS FRACTURE;  Surgeon: Vickki HearingStanley E Harrison, MD;  Location: [facility];  Service: Orthopedics;  Laterality: Left;  Open Reduction Internal Fixation of Left Proximal Humerus Fracture, Closing Wedge Osteotomy, Bone Graft  . Shoulder surgery     Family History  Problem Relation Age of Onset  . Heart disease    . Arthritis    . Anesthesia problems Neg Hx   . Hypotension Neg Hx   . Malignant hyperthermia Neg Hx   . Pseudochol deficiency Neg Hx    Social History  Substance Use Topics  . Smoking status: Former Smoker -- 0.50 packs/day for 7 years    Types: Cigarettes    Quit date: 09/19/2011  . Smokeless tobacco: Never Used  . Alcohol Use: No    Review of Systems  Constitutional: Negative for fever and chills.  Musculoskeletal: Positive for joint  swelling and arthralgias (right hand pain and swelling).  Skin: Negative for color change and wound.  All other systems reviewed and are negative.     Allergies  Tramadol  Home Medications   Prior to Admission medications   Medication Sig Start Date End Date Taking? Authorizing Provider  diclofenac (VOLTAREN) 75 MG EC tablet Take 1 tablet (75 mg total) by mouth 3 (three) times daily. 07/06/14   Gilda Creasehristopher J Pollina, MD  HYDROcodone-acetaminophen (NORCO/VICODIN) 5-325 MG tablet Take one tab po q 4-6 hrs prn pain 12/07/15   Jyoti Harju, PA-C  methocarbamol (ROBAXIN) 500 MG tablet Take 1 tablet (500 mg total) by mouth 2 (two) times daily. 07/06/14   Gilda Creasehristopher J Pollina, MD  naproxen (NAPROSYN) 500 MG tablet Take 1 tablet (500 mg total) by mouth 2 (two) times daily with a meal. 12/07/15   Latavion Halls, PA-C  oxymorphone (OPANA ER, CRUSH RESISTANT,) 10 MG T12A 12 hr tablet Take 10 mg by mouth every 12 (twelve) hours.    Historical Provider, MD  pregabalin (LYRICA) 75 MG capsule Take 75 mg by mouth 2 (two) times daily.    Historical Provider, MD   BP 167/84 mmHg  Pulse 102  Temp(Src) 98.4 F (36.9 C) (Oral)  Resp 18  Ht 5\' 8"  (  1.727 m)  Wt 65.318 kg  BMI 21.90 kg/m2  SpO2 100% Physical Exam  Constitutional: He is oriented to person, place, and time. He appears well-developed and well-nourished. No distress.  HENT:  Head: Normocephalic and atraumatic.  Cardiovascular: Normal rate, regular rhythm and intact distal pulses.   Pulmonary/Chest: Effort normal and breath sounds normal. No respiratory distress.  Musculoskeletal: He exhibits edema and tenderness.  Localized edema and bruising of the dorsal right hand.  Radial pulse is brisk, distal sensation intact.  CR< 2 sec.  No bruising or bony deformity.  Patient has full ROM. Fingers are spared.  Compartments soft.  Neurological: He is alert and oriented to person, place, and time. He exhibits normal muscle tone. Coordination  normal.  Skin: Skin is warm and dry.  Nursing note and vitals reviewed.   ED Course  Procedures (including critical care time) Labs Review Labs Reviewed - No data to display  Imaging Review Dg Hand Complete Right  12/07/2015  CLINICAL DATA:  Right hand pain, bruising and swelling over the 3rd to 5th metacarpals after falling last night. Subsequent encounter. EXAM: RIGHT HAND - COMPLETE 3+ VIEW COMPARISON:  Radiographs 12/06/2015 and 03/01/2012. FINDINGS: Patient has developed dorsal soft tissue swelling. There is no evidence of foreign body, soft tissue emphysema, acute fracture or dislocation. The joint spaces are maintained. IMPRESSION: Dorsal soft tissue swelling without evidence of acute osseous injury. Electronically Signed   By: Carey Bullocks M.D.   On: 12/07/2015 17:42    I have personally reviewed and evaluated these images and lab results as part of my medical decision-making.    MDM   Final diagnoses:  Contusion, hand, right, initial encounter    Localized edema and bruising to the dorsal right hand.  NV intact, XR negative for fx.  Wrist splint applied.  He agrees to ice, elevate, ortho f/u if not improving.      Rosey Bath 12/10/15 2127  Bethann Berkshire, MD 12/13/15 1025

## 2016-04-28 ENCOUNTER — Emergency Department (HOSPITAL_COMMUNITY): Payer: Medicaid Other

## 2016-04-28 ENCOUNTER — Emergency Department (HOSPITAL_COMMUNITY)
Admission: EM | Admit: 2016-04-28 | Discharge: 2016-04-28 | Disposition: A | Discharge status: Home / Self Care | Payer: Medicaid Other | Attending: Emergency Medicine | Admitting: Emergency Medicine

## 2016-04-28 ENCOUNTER — Encounter (HOSPITAL_COMMUNITY): Payer: Self-pay

## 2016-04-28 DIAGNOSIS — Z7982 Long term (current) use of aspirin: Secondary | ICD-10-CM | POA: Diagnosis not present

## 2016-04-28 DIAGNOSIS — Z87891 Personal history of nicotine dependence: Secondary | ICD-10-CM | POA: Diagnosis not present

## 2016-04-28 DIAGNOSIS — R109 Unspecified abdominal pain: Secondary | ICD-10-CM | POA: Insufficient documentation

## 2016-04-28 DIAGNOSIS — Z791 Long term (current) use of non-steroidal anti-inflammatories (NSAID): Secondary | ICD-10-CM | POA: Insufficient documentation

## 2016-04-28 DIAGNOSIS — I10 Essential (primary) hypertension: Secondary | ICD-10-CM | POA: Insufficient documentation

## 2016-04-28 DIAGNOSIS — R52 Pain, unspecified: Secondary | ICD-10-CM

## 2016-04-28 DIAGNOSIS — F329 Major depressive disorder, single episode, unspecified: Secondary | ICD-10-CM | POA: Diagnosis not present

## 2016-04-28 DIAGNOSIS — Z792 Long term (current) use of antibiotics: Secondary | ICD-10-CM | POA: Diagnosis not present

## 2016-04-28 DIAGNOSIS — N50811 Right testicular pain: Secondary | ICD-10-CM

## 2016-04-28 LAB — URINE MICROSCOPIC-ADD ON
Bacteria, UA: NONE SEEN
Squamous Epithelial / LPF: NONE SEEN

## 2016-04-28 LAB — URINALYSIS, ROUTINE W REFLEX MICROSCOPIC
Bilirubin Urine: NEGATIVE
Glucose, UA: NEGATIVE mg/dL
Ketones, ur: NEGATIVE mg/dL
LEUKOCYTES UA: NEGATIVE
Nitrite: NEGATIVE
PH: 7 (ref 5.0–8.0)
Protein, ur: NEGATIVE mg/dL

## 2016-04-28 MED ORDER — MORPHINE SULFATE (PF) 4 MG/ML IV SOLN
INTRAVENOUS | Status: AC
  Filled 2016-04-28: qty 1

## 2016-04-28 MED ORDER — ONDANSETRON HCL 8 MG PO TABS: 8.0000 mg | ORAL_TABLET | ORAL | Status: DC | PRN

## 2016-04-28 MED ORDER — MORPHINE SULFATE (PF) 4 MG/ML IV SOLN
4.0000 mg | Freq: Once | INTRAVENOUS | Status: AC
  Administered 2016-04-28: 4 mg via INTRAVENOUS

## 2016-04-28 MED ORDER — MORPHINE SULFATE (PF) 4 MG/ML IV SOLN
4.0000 mg | Freq: Once | INTRAVENOUS | Status: AC
  Administered 2016-04-28: 4 mg via INTRAVENOUS
  Filled 2016-04-28: qty 1

## 2016-04-28 MED ORDER — SODIUM CHLORIDE 0.9 % IV BOLUS (SEPSIS)
500.0000 mL | Freq: Once | INTRAVENOUS | Status: AC
  Administered 2016-04-28: 500 mL via INTRAVENOUS

## 2016-04-28 MED ORDER — OXYCODONE-ACETAMINOPHEN 5-325 MG PO TABS: 1.0000 | ORAL_TABLET | ORAL | Status: DC | PRN

## 2016-04-28 MED ORDER — KETOROLAC TROMETHAMINE 30 MG/ML IJ SOLN
30.0000 mg | Freq: Once | INTRAMUSCULAR | Status: AC
  Administered 2016-04-28: 30 mg via INTRAVENOUS
  Filled 2016-04-28: qty 1

## 2016-04-28 MED ORDER — ONDANSETRON HCL 4 MG/2ML IJ SOLN
4.0000 mg | Freq: Once | INTRAMUSCULAR | Status: AC
  Administered 2016-04-28: 4 mg via INTRAVENOUS
  Filled 2016-04-28: qty 2

## 2016-04-28 MED ORDER — TAMSULOSIN HCL 0.4 MG PO CAPS: 0.4000 mg | ORAL_CAPSULE | Freq: Every day | ORAL | Status: DC

## 2016-04-28 NOTE — ED Provider Notes (Signed)
CSN: [id number]     Arrival date & time 04/28/16  1226 History   First MD Initiated Contact with Patient 04/28/16 1308     Chief Complaint  Patient presents with  . Flank Pain     (Consider location/radiation/quality/duration/timing/severity/associated sxs/prior Treatment) HPI...Marland KitchenMarland KitchenRight flank pain with radiation to right testicle since 4 AM this morning. Decreased urinary flow. No personal history of kidney stones, but many family members have kidney stones. No fever, chills, hematuria, abd pain.  Severity of pain is moderate  Past Medical History  Diagnosis Date  . Anxiety   . Seizures (HCC)   . Depression   . Seizures (HCC)   . Hypertension   . Complication of anesthesia     had a syncopal episode after rhinoplasty  . Back pain    Past Surgical History  Procedure Laterality Date  . Nose surgery      [facility]  . Rhinoplasty      [facility]  . Orif humerus fracture  04/26/2012    Procedure: OPEN REDUCTION INTERNAL FIXATION (ORIF) PROXIMAL HUMERUS FRACTURE;  Surgeon: Vickki Hearing, MD;  Location: [facility];  Service: Orthopedics;  Laterality: Left;  Open Reduction Internal Fixation of Left Proximal Humerus Fracture, Closing Wedge Osteotomy, Bone Graft  . Shoulder surgery     Family History  Problem Relation Age of Onset  . Heart disease    . Arthritis    . Anesthesia problems Neg Hx   . Hypotension Neg Hx   . Malignant hyperthermia Neg Hx   . Pseudochol deficiency Neg Hx    Social History  Substance Use Topics  . Smoking status: Former Smoker -- 0.50 packs/day for 7 years    Types: Cigarettes    Quit date: 09/19/2011  . Smokeless tobacco: Never Used  . Alcohol Use: No    Review of Systems  All other systems reviewed and are negative.     Allergies  Tramadol  Home Medications   Prior to Admission medications   Medication Sig Start Date End Date Taking? Authorizing Provider  Aspirin-Acetaminophen-Caffeine (GOODY HEADACHE PO) Take 1 packet by mouth daily as needed  (for pain).   Yes Historical Provider, MD  ibuprofen (ADVIL,MOTRIN) 800 MG tablet Take 1 tablet by mouth as needed. 04/09/16  Yes Historical Provider, MD  penicillin v potassium (VEETID) 500 MG tablet Take 1 tablet by mouth 4 (four) times daily. 04/09/16  Yes Historical Provider, MD  HYDROcodone-acetaminophen (NORCO/VICODIN) 5-325 MG tablet Take one tab po q 4-6 hrs prn pain Patient not taking: Reported on 04/28/2016 12/07/15   Tammy Triplett, PA-C  naproxen (NAPROSYN) 500 MG tablet Take 1 tablet (500 mg total) by mouth 2 (two) times daily with a meal. Patient not taking: Reported on 04/28/2016 12/07/15   Tammy Triplett, PA-C  ondansetron (ZOFRAN) 8 MG tablet Take 1 tablet (8 mg total) by mouth every 4 (four) hours as needed. 04/28/16   Donnetta Hutching, MD  oxyCODONE-acetaminophen (PERCOCET/ROXICET) 5-325 MG tablet Take 1-2 tablets by mouth every 4 (four) hours as needed for severe pain. 04/28/16   Donnetta Hutching, MD  tamsulosin (FLOMAX) 0.4 MG CAPS capsule Take 1 capsule (0.4 mg total) by mouth daily. 04/28/16   Donnetta Hutching, MD   BP 137/85 mmHg  Pulse 91  Temp(Src) 98.2 F (36.8 C) (Oral)  Resp 16  Ht  (1.753 m)  Wt 145 lb (65.772 kg)  BMI 21.40 kg/m2  SpO2 100% Physical Exam  Constitutional: He is oriented to person, place, and time. He appears  well-developed and well-nourished.  HENT:  Head: Normocephalic and atraumatic.  Eyes: Conjunctivae and EOM are normal. Pupils are equal, round, and reactive to light.  Neck: Normal range of motion. Neck supple.  Cardiovascular: Normal rate and regular rhythm.   Pulmonary/Chest: Effort normal and breath sounds normal.  Abdominal: Soft. Bowel sounds are normal.  Genitourinary:  Minimal tenderness right flank.  Minimal tenderness right testicle.  Musculoskeletal: Normal range of motion.  Neurological: He is alert and oriented to person, place, and time.  Skin: Skin is warm and dry.  Psychiatric: He has a normal mood and affect. His behavior is normal.   Nursing note and vitals reviewed.   ED Course  Procedures (including critical care time) Labs Review Labs Reviewed  URINALYSIS, ROUTINE W REFLEX MICROSCOPIC (NOT AT [facility]) - Abnormal; Notable for the following:    Specific Gravity, Urine <1.005 (*)    Hgb urine dipstick LARGE (*)    All other components within normal limits  URINE MICROSCOPIC-ADD ON    Imaging Review US Scrotum  04/28/2016  CLINICAL DATA:  RIGHT testicular pain since 4 a.m. EXAM: SCROTAL ULTRASOUND DOPPLER ULTRASOUND OF THE TESTICLES TECHNIQUE: Complete ultrasound examination of the testicles, epididymis, and other scrotal structures was performed. Color and spectral Doppler ultrasound were also utilized to evaluate blood flow to the testicles. COMPARISON:  None. FINDINGS: Right testicle Measurements: Normal in size and homogeneous echotexture measuring 5.6 x 2.7 x 3.1 cm. Calculated volume of 24 cubic cm. Left testicle Measurements: Normal size and homogeneous echotexture measuring 5.0 x 2.5 x 3.7 cm calculated volume was 224.3 cubic cm. Right epididymis:  Normal in size and appearance. Left epididymis:  Normal in size and appearance. Hydrocele:  Small bilateral hydrocele Varicocele:  None visualized. Pulsed Doppler interrogation of both testes demonstrates normal low resistance arterial and venous waveforms bilaterally. IMPRESSION: 1. Normal testicular ultrasound. 2. Small bilateral hydroceles. Electronically Signed   By: Genevive Bi M.D.   On: 04/28/2016 16:47   [place] Art/ven Flow Abd Pelv Doppler  04/28/2016  CLINICAL DATA:  RIGHT testicular pain since 4 a.m. EXAM: SCROTAL ULTRASOUND DOPPLER ULTRASOUND OF THE TESTICLES TECHNIQUE: Complete ultrasound examination of the testicles, epididymis, and other scrotal structures was performed. Color and spectral Doppler ultrasound were also utilized to evaluate blood flow to the testicles. COMPARISON:  None. FINDINGS: Right testicle Measurements: Normal in size and homogeneous  echotexture measuring 5.6 x 2.7 x 3.1 cm. Calculated volume of 24 cubic cm. Left testicle Measurements: Normal size and homogeneous echotexture measuring 5.0 x 2.5 x 3.7 cm calculated volume was 224.3 cubic cm. Right epididymis:  Normal in size and appearance. Left epididymis:  Normal in size and appearance. Hydrocele:  Small bilateral hydrocele Varicocele:  None visualized. Pulsed Doppler interrogation of both testes demonstrates normal low resistance arterial and venous waveforms bilaterally. IMPRESSION: 1. Normal testicular ultrasound. 2. Small bilateral hydroceles. Electronically Signed   By: Genevive Bi M.D.   On: 04/28/2016 16:47   Ct Renal Stone Study  04/28/2016  CLINICAL DATA:  Left-sided flank pain for several hours with dark urine EXAM: CT ABDOMEN AND PELVIS WITHOUT CONTRAST TECHNIQUE: Multidetector CT imaging of the abdomen and pelvis was performed following the standard protocol without IV contrast. COMPARISON:  None. FINDINGS: Lung bases are free of acute infiltrate or sizable effusion. The liver, gallbladder, spleen, adrenal glands and pancreas are within normal limits. Kidneys are well visualized bilaterally. No renal calculi or obstructive changes are seen. No ureteral calculi are noted. The appendix is not well  visualized although no inflammatory changes are identified. No vascular abnormality is seen. The bladder is well distended. No pelvic mass lesion or sidewall abnormality is noted. The osseous structures show no acute abnormality. IMPRESSION: No evidence renal calculi or obstructive changes. No other focal abnormality is seen. Electronically Signed   By: Alcide CleverMark  Lukens M.D.   On: 04/28/2016 15:30   I have personally reviewed and evaluated these images and lab results as part of my medical decision-making.   EKG Interpretation None      MDM   Final diagnoses:  Pain in right testicle  Right flank pain    History and physical suggest kidney stone. Urinalysis reveals  hemoglobin. Renal CT and testicular ultrasound negative. Pain adequately treated. Discharge medications Percocet, Zofran 8 mg, Flomax 0.4 mg    Donnetta HutchingBrian Satonya Lux, MD 04/28/16 1740

## 2016-04-28 NOTE — ED Notes (Signed)
Pt reports that he woke up at 4 am with right flank pain , Pain has now moved across pain. Reports pain in scrotal area and difficulty urinating

## 2016-04-28 NOTE — ED Notes (Signed)
Dr Cook at bedside

## 2016-04-28 NOTE — Discharge Instructions (Signed)
Follow-up with urologist if symptoms do not improve. Phone number given. Medications for pain, nausea, and to increase urinary flow.

## 2016-08-05 ENCOUNTER — Emergency Department (HOSPITAL_COMMUNITY)
Admission: EM | Admit: 2016-08-05 | Discharge: 2016-08-06 | Disposition: A | Discharge status: Home / Self Care | Payer: Medicaid Other | Attending: Emergency Medicine | Admitting: Emergency Medicine

## 2016-08-05 ENCOUNTER — Encounter (HOSPITAL_COMMUNITY): Payer: Self-pay

## 2016-08-05 DIAGNOSIS — R319 Hematuria, unspecified: Secondary | ICD-10-CM

## 2016-08-05 DIAGNOSIS — Z7982 Long term (current) use of aspirin: Secondary | ICD-10-CM | POA: Insufficient documentation

## 2016-08-05 DIAGNOSIS — R109 Unspecified abdominal pain: Secondary | ICD-10-CM

## 2016-08-05 DIAGNOSIS — I10 Essential (primary) hypertension: Secondary | ICD-10-CM | POA: Diagnosis not present

## 2016-08-05 DIAGNOSIS — R1031 Right lower quadrant pain: Secondary | ICD-10-CM | POA: Insufficient documentation

## 2016-08-05 DIAGNOSIS — Z79899 Other long term (current) drug therapy: Secondary | ICD-10-CM | POA: Insufficient documentation

## 2016-08-05 DIAGNOSIS — Z87891 Personal history of nicotine dependence: Secondary | ICD-10-CM | POA: Diagnosis not present

## 2016-08-05 DIAGNOSIS — N23 Unspecified renal colic: Secondary | ICD-10-CM

## 2016-08-05 LAB — BASIC METABOLIC PANEL
ANION GAP: 8 (ref 5–15)
BUN: 7 mg/dL (ref 6–20)
CALCIUM: 9 mg/dL (ref 8.9–10.3)
CO2: 25 mmol/L (ref 22–32)
CREATININE: 0.77 mg/dL (ref 0.61–1.24)
Chloride: 104 mmol/L (ref 101–111)
Glucose, Bld: 97 mg/dL (ref 65–99)
Potassium: 3.3 mmol/L — ABNORMAL LOW (ref 3.5–5.1)
SODIUM: 137 mmol/L (ref 135–145)

## 2016-08-05 LAB — URINALYSIS, ROUTINE W REFLEX MICROSCOPIC
BILIRUBIN URINE: NEGATIVE
Glucose, UA: NEGATIVE mg/dL
KETONES UR: NEGATIVE mg/dL
LEUKOCYTES UA: NEGATIVE
NITRITE: NEGATIVE
PROTEIN: NEGATIVE mg/dL
Specific Gravity, Urine: <1.005 — ABNORMAL LOW (ref 1.005–1.030)
pH: 6.5 (ref 5.0–8.0)

## 2016-08-05 LAB — URINE MICROSCOPIC-ADD ON
Bacteria, UA: NONE SEEN
RBC / HPF: NONE SEEN RBC/hpf (ref 0–5)
Squamous Epithelial / LPF: NONE SEEN
WBC UA: NONE SEEN WBC/hpf (ref 0–5)

## 2016-08-05 MED ORDER — ONDANSETRON HCL 4 MG/2ML IJ SOLN
4.0000 mg | Freq: Once | INTRAMUSCULAR | Status: AC
  Administered 2016-08-05: 4 mg via INTRAVENOUS
  Filled 2016-08-05: qty 2

## 2016-08-05 MED ORDER — KETOROLAC TROMETHAMINE 30 MG/ML IJ SOLN
30.0000 mg | Freq: Once | INTRAMUSCULAR | Status: AC
  Administered 2016-08-05: 30 mg via INTRAVENOUS
  Filled 2016-08-05: qty 1

## 2016-08-05 MED ORDER — HYDROMORPHONE HCL 1 MG/ML IJ SOLN
0.5000 mg | Freq: Once | INTRAMUSCULAR | Status: AC
Start: 2016-08-05 — End: 2016-08-05
  Administered 2016-08-05: 0.5 mg via INTRAVENOUS
  Filled 2016-08-05: qty 1

## 2016-08-05 NOTE — ED Notes (Signed)
Patient states he pain" feels like someone is squeezing his privates". States that his pain is a 9 at this time. States that his pain feels like the last kidney stone he had back in May.

## 2016-08-05 NOTE — ED Notes (Signed)
When pt was called to be roomed, pt was outside, pt walked into hospital with unwavering gait, until he came to ED doors and pt then began to bend over holding left side and stated he needed a wheelchair to get to the back; pt smelled strongly of cigarettes; pt was then taken to room by wheelchair

## 2016-08-05 NOTE — ED Triage Notes (Signed)
Right flank pain with radiation down into right groin.  Possible kidney stone.  Having trouble with urination and I have vomited several times.  Had a kidney stone once before.

## 2016-08-06 MED ORDER — TAMSULOSIN HCL 0.4 MG PO CAPS
0.4000 mg | ORAL_CAPSULE | Freq: Once | ORAL | Status: AC
Start: 2016-08-06 — End: 2016-08-06
  Administered 2016-08-06: 0.4 mg via ORAL
  Filled 2016-08-06: qty 1

## 2016-08-06 MED ORDER — HYDROMORPHONE HCL 1 MG/ML IJ SOLN
1.0000 mg | Freq: Once | INTRAMUSCULAR | Status: AC
  Administered 2016-08-06: 1 mg via INTRAVENOUS
  Filled 2016-08-06: qty 1

## 2016-08-06 MED ORDER — ONDANSETRON 8 MG PO TBDP: 8.0000 mg | ORAL_TABLET | Freq: Three times a day (TID) | ORAL | 0 refills | Status: DC | PRN

## 2016-08-06 MED ORDER — HYDROCODONE-ACETAMINOPHEN 5-325 MG PO TABS: 1.0000 | ORAL_TABLET | ORAL | 0 refills | Status: DC | PRN

## 2016-08-06 MED ORDER — TAMSULOSIN HCL 0.4 MG PO CAPS: 0.4000 mg | ORAL_CAPSULE | Freq: Every day | ORAL | 0 refills | Status: DC

## 2016-08-06 NOTE — ED Notes (Signed)
Patient verbalizes understanding of discharge instructions, prescriptions, home care and follow up care. Patient out of department at this time. 

## 2016-08-06 NOTE — Discharge Instructions (Signed)
You may take the oxycodone prescribed for pain relief.  This will make you drowsy - do not drive within 4 hours of taking this medication. ° °

## 2016-08-06 NOTE — ED Provider Notes (Signed)
[facility] DEPT Provider Note   CSN: [id number] Arrival date & time: 08/05/16  2030     History   Chief Complaint Chief Complaint  Patient presents with  . Flank Pain    HPI Alan Jennings is a 35 y.o. male presenting with intermittent right flank pain which started mid afternoon today when he was riding with family in a truck on bumpy terrain.  At first he thought it was simply back pain, but it has progressed now to include pain radiating into the right groin and scrotal area and includes darker than normal urine, small amounts of urine frequently along with right sided pain with urination similar to prior kidney stone pain.  He was last seen here in May for similar sx although his CT scan at that time was negative for any stones in the urinary tree.  He has had tylenol prior to arrival with no relief.  He denies fevers or chills but has had nausea with emesis with severe pain episodes. .  The history is provided by the patient and a parent.  Father is here with pt.  Past Medical History:  Diagnosis Date  . Anxiety   . Back pain   . Complication of anesthesia    had a syncopal episode after rhinoplasty  . Depression   . Hypertension   . Renal disorder    kidney stones  . Seizures (HCC)   . Seizures [facility])     Patient Active Problem List   Diagnosis Date Noted  . Muscle weakness (generalized) 09/22/2012  . Neurogenic pain 08/03/2012  . Hypokalemia 06/03/2012  . Left shoulder pain 06/03/2012  . Wound infection after surgery 06/03/2012  . Status post shoulder surgery 05/11/2012  . Shoulder injury 11/13/2011  . Contusion of arm, left 10/08/2011  . Fracture of humerus, proximal, left, closed 08/27/2011  . Fracture, humerus 06/11/2011  . CLOSED FRACTURE UNSPEC PART UPPER END HUMERUS 02/27/2011    Past Surgical History:  Procedure Laterality Date  . NOSE SURGERY     [facility]  . ORIF HUMERUS FRACTURE  04/26/2012   Procedure: OPEN REDUCTION INTERNAL FIXATION (ORIF)  PROXIMAL HUMERUS FRACTURE;  Surgeon: Vickki Hearing, MD;  Location: [facility];  Service: Orthopedics;  Laterality: Left;  Open Reduction Internal Fixation of Left Proximal Humerus Fracture, Closing Wedge Osteotomy, Bone Graft  . RHINOPLASTY     [facility]  . SHOULDER SURGERY         Home Medications    Prior to Admission medications   Medication Sig Start Date End Date Taking? Authorizing Provider  aspirin-acetaminophen-caffeine (EXCEDRIN EXTRA STRENGTH) [phone] MG tablet Take 1-2 tablets by mouth every 6 (six) hours as needed (for pain).   Yes Historical Provider, MD  Aspirin-Acetaminophen-Caffeine (GOODY HEADACHE PO) Take 1 packet by mouth daily as needed (for pain).   Yes Historical Provider, MD  HYDROcodone-acetaminophen (NORCO/VICODIN) 5-325 MG tablet Take 1 tablet by mouth every 4 (four) hours as needed. 08/06/16   Burgess Amor, PA-C  ondansetron (ZOFRAN ODT) 8 MG disintegrating tablet Take 1 tablet (8 mg total) by mouth every 8 (eight) hours as needed for nausea or vomiting. 08/06/16   Burgess Amor, PA-C  tamsulosin (FLOMAX) 0.4 MG CAPS capsule Take 1 capsule (0.4 mg total) by mouth daily after supper. 08/06/16   Burgess Amor, PA-C    Family History Family History  Problem Relation Age of Onset  . Heart disease    . Arthritis    . Anesthesia problems Neg Hx   .  Hypotension Neg Hx   . Malignant hyperthermia Neg Hx   . Pseudochol deficiency Neg Hx     Social History Social History  Substance Use Topics  . Smoking status: Former Smoker    Packs/day: 0.50    Years: 7.00    Types: Cigarettes    Quit date: 09/19/2011  . Smokeless tobacco: Never Used  . Alcohol use No     Allergies   Tramadol   Review of Systems Review of Systems  Constitutional: Negative for fever.  HENT: Negative for congestion and sore throat.   Eyes: Negative.   Respiratory: Negative for chest tightness and shortness of breath.   Cardiovascular: Negative for chest pain.  Gastrointestinal: Negative for  abdominal pain and nausea.       Negative except as mentioned in HPI.   Genitourinary: Positive for dysuria, flank pain, frequency and testicular pain. Negative for discharge, penile pain, penile swelling and scrotal swelling.  Musculoskeletal: Negative for arthralgias, joint swelling and neck pain.  Skin: Negative.  Negative for rash and wound.  Neurological: Negative for dizziness, weakness, light-headedness, numbness and headaches.  Psychiatric/Behavioral: Negative.      Physical Exam Updated Vital Signs BP (!) 131/101 (BP Location: Right Arm)   Pulse 89   Temp 98.6 F (37 C) (Oral)   Resp 20   SpO2 100%   Physical Exam  Constitutional: He appears well-developed and well-nourished.  HENT:  Head: Normocephalic and atraumatic.  Eyes: Conjunctivae are normal.  Neck: Normal range of motion.  Cardiovascular: Normal rate, regular rhythm, normal heart sounds and intact distal pulses.   Pulmonary/Chest: Effort normal and breath sounds normal. He has no wheezes.  Abdominal: Soft. Bowel sounds are normal. He exhibits no distension and no mass. There is no tenderness. There is CVA tenderness. There is no guarding. No hernia. Hernia confirmed negative in the right inguinal area.  Mild ttp right flank.  No edema, no nodules or mass.   Genitourinary: Penis normal. Cremasteric reflex is present. Right testis shows no mass, no swelling and no tenderness. Circumcised.  Musculoskeletal: Normal range of motion.  Lymphadenopathy: No inguinal adenopathy noted on the right side.  Neurological: He is alert.  Skin: Skin is warm and dry.  Psychiatric: He has a normal mood and affect.  Nursing note and vitals reviewed.    ED Treatments / Results  Labs (all labs ordered are listed, but only abnormal results are displayed) Labs Reviewed  URINALYSIS, ROUTINE W REFLEX MICROSCOPIC (NOT [facility]) - Abnormal; Notable for the following:       Result Value   Color, Urine STRAW (*)    Specific Gravity,  Urine <1.005 (*)    Hgb urine dipstick LARGE (*)    All other components within normal limits  BASIC METABOLIC PANEL - Abnormal; Notable for the following:    Potassium 3.3 (*)    All other components within normal limits  URINE MICROSCOPIC-ADD ON    EKG  EKG Interpretation None       Radiology No results found.  Procedures Procedures (including critical care time)  Medications Ordered in ED Medications  HYDROmorphone (DILAUDID) injection 1 mg (not administered)  tamsulosin (FLOMAX) capsule 0.4 mg (not administered)  ketorolac (TORADOL) 30 MG/ML injection 30 mg (30 mg Intravenous Given 08/05/16 2341)  ondansetron (ZOFRAN) injection 4 mg (4 mg Intravenous Given 08/05/16 2341)  HYDROmorphone (DILAUDID) injection 0.5 mg (0.5 mg Intravenous Given 08/05/16 2341)     Initial Impression / Assessment and Plan / ED Course  I have reviewed the triage vital signs and the nursing notes.  Pertinent labs & imaging results that were available during my care of the patient were reviewed by me and considered in my medical decision making (see chart for details).  Clinical Course    Pt given toradol 30 mg and dilaudid 0.5 mg IV with some pain relief, still with fair discomfort.  He was given dilaudid 1 mg IV additionally, also given dose of flomax.    Labs reviewed and discussed with patient.  He was referred to urology to establish care, strain all urine, prescribed percocet, flomax, zofran prn nausea.  Advised to return here in the interim for any worsened pain, fevers, uncontrolled emesis.  Ct from May reviewed, no renal stones at that time, suspect with hematuria he does have an active ureteral stone, since CT 3 months ago, will not repeat tonight.   Final Clinical Impressions(s) / ED Diagnoses   Final diagnoses:  Ureteral colic  Hematuria  Flank pain    New Prescriptions New Prescriptions   HYDROCODONE-ACETAMINOPHEN (NORCO/VICODIN) 5-325 MG TABLET    Take 1 tablet by mouth  every 4 (four) hours as needed.   ONDANSETRON (ZOFRAN ODT) 8 MG DISINTEGRATING TABLET    Take 1 tablet (8 mg total) by mouth every 8 (eight) hours as needed for nausea or vomiting.   TAMSULOSIN (FLOMAX) 0.4 MG CAPS CAPSULE    Take 1 capsule (0.4 mg total) by mouth daily after supper.     Burgess AmorJulie Amauris Debois, PA-C 08/06/16 0103    Burgess AmorJulie Amye Grego, PA-C 08/06/16 [id number]    Shon Batonourtney F Horton, MD 08/06/16 [phone]

## 2016-09-06 ENCOUNTER — Emergency Department (HOSPITAL_COMMUNITY): Payer: Medicaid Other

## 2016-09-06 ENCOUNTER — Emergency Department (HOSPITAL_COMMUNITY)
Admission: EM | Admit: 2016-09-06 | Discharge: 2016-09-06 | Disposition: A | Discharge status: Home / Self Care | Payer: Medicaid Other | Attending: Emergency Medicine | Admitting: Emergency Medicine

## 2016-09-06 ENCOUNTER — Encounter (HOSPITAL_COMMUNITY): Payer: Self-pay | Admitting: Emergency Medicine

## 2016-09-06 DIAGNOSIS — R110 Nausea: Secondary | ICD-10-CM | POA: Insufficient documentation

## 2016-09-06 DIAGNOSIS — R109 Unspecified abdominal pain: Secondary | ICD-10-CM | POA: Diagnosis not present

## 2016-09-06 DIAGNOSIS — Z87891 Personal history of nicotine dependence: Secondary | ICD-10-CM | POA: Insufficient documentation

## 2016-09-06 DIAGNOSIS — R319 Hematuria, unspecified: Secondary | ICD-10-CM | POA: Insufficient documentation

## 2016-09-06 DIAGNOSIS — I10 Essential (primary) hypertension: Secondary | ICD-10-CM | POA: Insufficient documentation

## 2016-09-06 DIAGNOSIS — Z7982 Long term (current) use of aspirin: Secondary | ICD-10-CM | POA: Insufficient documentation

## 2016-09-06 DIAGNOSIS — N50811 Right testicular pain: Secondary | ICD-10-CM | POA: Diagnosis not present

## 2016-09-06 DIAGNOSIS — Z79899 Other long term (current) drug therapy: Secondary | ICD-10-CM | POA: Insufficient documentation

## 2016-09-06 LAB — URINALYSIS, ROUTINE W REFLEX MICROSCOPIC
Bilirubin Urine: NEGATIVE
GLUCOSE, UA: NEGATIVE mg/dL
KETONES UR: NEGATIVE mg/dL
Leukocytes, UA: NEGATIVE
Nitrite: NEGATIVE
PH: 7 (ref 5.0–8.0)
PROTEIN: NEGATIVE mg/dL
Specific Gravity, Urine: 1.01 (ref 1.005–1.030)

## 2016-09-06 LAB — URINE MICROSCOPIC-ADD ON

## 2016-09-06 MED ORDER — HYDROCODONE-ACETAMINOPHEN 5-325 MG PO TABS: ORAL_TABLET | ORAL | 0 refills | Status: DC

## 2016-09-06 MED ORDER — HYDROMORPHONE HCL 1 MG/ML IJ SOLN
1.0000 mg | Freq: Once | INTRAMUSCULAR | Status: AC
  Administered 2016-09-06: 1 mg via INTRAVENOUS
  Filled 2016-09-06: qty 1

## 2016-09-06 MED ORDER — ONDANSETRON HCL 4 MG PO TABS: 4.0000 mg | ORAL_TABLET | Freq: Four times a day (QID) | ORAL | 0 refills | Status: DC

## 2016-09-06 MED ORDER — TAMSULOSIN HCL 0.4 MG PO CAPS: 0.4000 mg | ORAL_CAPSULE | Freq: Every day | ORAL | 0 refills | Status: DC

## 2016-09-06 MED ORDER — KETOROLAC TROMETHAMINE 30 MG/ML IJ SOLN
30.0000 mg | Freq: Once | INTRAMUSCULAR | Status: AC
Start: 2016-09-06 — End: 2016-09-06
  Administered 2016-09-06: 30 mg via INTRAVENOUS
  Filled 2016-09-06: qty 1

## 2016-09-06 MED ORDER — ONDANSETRON HCL 4 MG/2ML IJ SOLN
4.0000 mg | Freq: Once | INTRAMUSCULAR | Status: AC
  Administered 2016-09-06: 4 mg via INTRAVENOUS
  Filled 2016-09-06: qty 2

## 2016-09-06 NOTE — ED Notes (Signed)
Pt transported to CT with Alan Jennings.  

## 2016-09-06 NOTE — ED Notes (Signed)
Pt reports increasing pain. EDP notified.

## 2016-09-06 NOTE — ED Notes (Signed)
EDP at bedside  

## 2016-09-06 NOTE — Discharge Instructions (Signed)
Call your primary doctor to see if you need a referral to urology. Call the urology group listed to arrange a follow-up

## 2016-09-06 NOTE — ED Triage Notes (Signed)
Pt reports right flank pain last night moving into right testicle this morning with nausea.  Reports hx of kidney stones recently, but none seen on last CT scan.

## 2016-09-09 NOTE — ED Provider Notes (Signed)
[facility] Provider Note   CSN: [id number] Arrival date & time: 09/06/16  0944     History   Chief Complaint Chief Complaint  Patient presents with  . Testicle Pain    HPI Alan Jennings is a 35 y.o. male.  HPI   Alan Jennings is a 35 y.o. male who presents to the Emergency Department complaining of sudden onset of right flank pain on the evening prior to arrival.  He states this morning, the pain has radiated into his right testicle and has been persistent.  Describes the pain as sharp.  He reports some difficulty urinating for one day.  Pain is associated with nausea.  He states that he has had kidney stones previously and pain feels somewhat similar.  He denies vomiting, fever, abdominal pain, penile discharge, or lesions   Past Medical History:  Diagnosis Date  . Anxiety   . Back pain   . Complication of anesthesia    had a syncopal episode after rhinoplasty  . Depression   . Hypertension   . Renal disorder    kidney stones  . Seizures (HCC)   . Seizures [facility](HCC)     Patient Active Problem List   Diagnosis Date Noted  . Muscle weakness (generalized) 09/22/2012  . Neurogenic pain 08/03/2012  . Hypokalemia 06/03/2012  . Left shoulder pain 06/03/2012  . Wound infection after surgery 06/03/2012  . Status post shoulder surgery 05/11/2012  . Shoulder injury 11/13/2011  . Contusion of arm, left 10/08/2011  . Fracture of humerus, proximal, left, closed 08/27/2011  . Fracture, humerus 06/11/2011  . CLOSED FRACTURE UNSPEC PART UPPER END HUMERUS 02/27/2011    Past Surgical History:  Procedure Laterality Date  . NOSE SURGERY     [facility]  . ORIF HUMERUS FRACTURE  04/26/2012   Procedure: OPEN REDUCTION INTERNAL FIXATION (ORIF) PROXIMAL HUMERUS FRACTURE;  Surgeon: Vickki HearingStanley E Harrison, MD;  Location: [facility];  Service: Orthopedics;  Laterality: Left;  Open Reduction Internal Fixation of Left Proximal Humerus Fracture, Closing Wedge Osteotomy, Bone Graft  . RHINOPLASTY      [facility]  . SHOULDER SURGERY         Home Medications    Prior to Admission medications   Medication Sig Start Date End Date Taking? Authorizing Provider  aspirin-acetaminophen-caffeine (EXCEDRIN EXTRA STRENGTH) [phone] MG tablet Take 1-2 tablets by mouth every 6 (six) hours as needed (for pain).    Historical Provider, MD  Aspirin-Acetaminophen-Caffeine (GOODY HEADACHE PO) Take 1 packet by mouth daily as needed (for pain).    Historical Provider, MD  HYDROcodone-acetaminophen (NORCO/VICODIN) 5-325 MG tablet Take one-two tabs po q 4-6 hrs prn pain 09/06/16   Tyron Manetta, PA-C  ondansetron (ZOFRAN ODT) 8 MG disintegrating tablet Take 1 tablet (8 mg total) by mouth every 8 (eight) hours as needed for nausea or vomiting. 08/06/16   Burgess AmorJulie Idol, PA-C  ondansetron (ZOFRAN) 4 MG tablet Take 1 tablet (4 mg total) by mouth every 6 (six) hours. 09/06/16   Nissim Fleischer, PA-C  tamsulosin (FLOMAX) 0.4 MG CAPS capsule Take 1 capsule (0.4 mg total) by mouth daily. 09/06/16   Aldric Wenzler, PA-C    Family History Family History  Problem Relation Age of Onset  . Heart disease    . Arthritis    . Anesthesia problems Neg Hx   . Hypotension Neg Hx   . Malignant hyperthermia Neg Hx   . Pseudochol deficiency Neg Hx     Social History Social History  Substance Use  Topics  . Smoking status: Former Smoker    Packs/day: 0.50    Years: 7.00    Types: Cigarettes    Quit date: 09/19/2011  . Smokeless tobacco: Never Used  . Alcohol use No     Allergies   Tramadol   Review of Systems Review of Systems  Constitutional: Negative for activity change, appetite change, chills and fever.  Respiratory: Negative for chest tightness and shortness of breath.   Cardiovascular: Negative for chest pain.  Gastrointestinal: Positive for nausea. Negative for abdominal pain and vomiting.  Genitourinary: Positive for difficulty urinating and testicular pain. Negative for decreased urine volume, dysuria, flank  pain, frequency, hematuria and urgency.  Musculoskeletal: Negative for back pain.  Skin: Negative for rash.  Neurological: Negative for dizziness, weakness and numbness.  Hematological: Negative for adenopathy.  Psychiatric/Behavioral: Negative for confusion.  All other systems reviewed and are negative.    Physical Exam Updated Vital Signs BP 106/62   Pulse (!) 56   Temp 98.1 F (36.7 C) (Oral)   Resp 16   Ht 5\' 9"  (1.753 m)   Wt 66.7 kg   SpO2 99%   BMI 21.71 kg/m   Physical Exam  Constitutional: He is oriented to person, place, and time. He appears well-developed and well-nourished.  Uncomfortable appearing  HENT:  Head: Normocephalic and atraumatic.  Mouth/Throat: Oropharynx is clear and moist.  Cardiovascular: Normal rate, regular rhythm and intact distal pulses.   No murmur heard. Pulmonary/Chest: Effort normal and breath sounds normal. No respiratory distress.  Abdominal: Soft. Bowel sounds are normal. He exhibits no distension and no mass. There is tenderness. There is no rebound, no guarding and no CVA tenderness.  Genitourinary: Testes normal. No penile tenderness.  Genitourinary Comments: Pt pointing to right flank then right testicle as areas of pain.  No tenderness to palpation.    Musculoskeletal: Normal range of motion. He exhibits no edema.  Neurological: He is alert and oriented to person, place, and time. He exhibits normal muscle tone. Coordination normal.  Skin: Skin is warm and dry.  Nursing note and vitals reviewed.    ED Treatments / Results  Labs (all labs ordered are listed, but only abnormal results are displayed) Labs Reviewed  URINALYSIS, ROUTINE W REFLEX MICROSCOPIC (NOT AT [facility]) - Abnormal; Notable for the following:       Result Value   APPearance HAZY (*)    Hgb urine dipstick LARGE (*)    All other components within normal limits  URINE MICROSCOPIC-ADD ON - Abnormal; Notable for the following:    Squamous Epithelial / LPF 0-5 (*)      Bacteria, UA FEW (*)    All other components within normal limits    EKG  EKG Interpretation None       Radiology Ct Renal Stone Study  Result Date: 09/06/2016 CLINICAL DATA:  Right flank and inguinal pain.  History of stones. EXAM: CT ABDOMEN AND PELVIS WITHOUT CONTRAST TECHNIQUE: Multidetector CT imaging of the abdomen and pelvis was performed following the standard protocol without IV contrast. COMPARISON:  04/28/2016 CT.  04/28/2016 scrotal ultrasound. FINDINGS: Lower chest: Clear lung bases. Normal heart size without pericardial or pleural effusion. Hepatobiliary: Normal liver. Normal gallbladder, without biliary ductal dilatation. Pancreas: Normal, without mass or ductal dilatation. Spleen: Normal in size, without focal abnormality. Adrenals/Urinary Tract: Normal adrenal glands. No renal calculi or hydronephrosis. No hydroureter or ureteric calculi. No bladder calculi. Stomach/Bowel: Normal stomach, without wall thickening. Normal colon and terminal ileum. Appendix may  be identified on image is a 65/series 2. No right lower quadrant inflammation seen. Normal small bowel. Vascular/Lymphatic: Normal caliber of the aorta and branch vessels. No abdominopelvic adenopathy. Reproductive: Normal prostate.  Small bilateral hydroceles. Other: No significant free fluid. Musculoskeletal: No acute osseous abnormality. IMPRESSION: 1.  No urinary tract calculi or hydronephrosis. 2. No right lower quadrant inflammation. The appendix may be visualized. 3. Small bilateral hydroceles. Electronically Signed   By: Jeronimo Greaves M.D.   On: 09/06/2016 11:05     Procedures Procedures (including critical care time)  Medications Ordered in ED Medications  ketorolac (TORADOL) 30 MG/ML injection 30 mg (30 mg Intravenous Given 09/06/16 1030)  ondansetron (ZOFRAN) injection 4 mg (4 mg Intravenous Given 09/06/16 1030)  HYDROmorphone (DILAUDID) injection 1 mg (1 mg Intravenous Given 09/06/16 1030)  HYDROmorphone  (DILAUDID) injection 1 mg (1 mg Intravenous Given 09/06/16 1153)     Initial Impression / Assessment and Plan / ED Course  I have reviewed the triage vital signs and the nursing notes.  Pertinent labs & imaging results that were available during my care of the patient were reviewed by me and considered in my medical decision making (see chart for details).  Clinical Course    Pt with hx of recurrent kidney stones.  Onset of pain was right flank and now right groin and testicle.  Pt had [place] of testicle in May of this year that was negative.  Hx is c/w kidney stone.    Will address his pain and order CT stone study.    CT neg for stone.  Pt is feeling better after medications.  Has ambulated in the dept w/o difficulty.  Also seen by Dr. Adriana Simas and care plan discussed.   Rx for pain medication, flomax and zofran.  Given urology referral.    Final Clinical Impressions(s) / ED Diagnoses   Final diagnoses:  Hematuria  Right flank pain    New Prescriptions Discharge Medication List as of 09/06/2016  1:02 PM    START taking these medications   Details  ondansetron (ZOFRAN) 4 MG tablet Take 1 tablet (4 mg total) by mouth every 6 (six) hours., Starting Sat 09/06/2016, Print         Elodia Hav[place], [place] 09/09/16 [id number]    Donnetta Hutching, MD 09/09/16 1249

## 2016-11-29 ENCOUNTER — Emergency Department (HOSPITAL_COMMUNITY)
Admission: EM | Admit: 2016-11-29 | Discharge: 2016-11-29 | Disposition: A | Discharge status: Home / Self Care | Payer: Medicaid Other | Attending: Emergency Medicine | Admitting: Emergency Medicine

## 2016-11-29 ENCOUNTER — Encounter (HOSPITAL_COMMUNITY): Payer: Self-pay | Admitting: Emergency Medicine

## 2016-11-29 DIAGNOSIS — I10 Essential (primary) hypertension: Secondary | ICD-10-CM | POA: Diagnosis not present

## 2016-11-29 DIAGNOSIS — G40909 Epilepsy, unspecified, not intractable, without status epilepticus: Secondary | ICD-10-CM | POA: Diagnosis present

## 2016-11-29 DIAGNOSIS — Z87891 Personal history of nicotine dependence: Secondary | ICD-10-CM | POA: Insufficient documentation

## 2016-11-29 DIAGNOSIS — Z79899 Other long term (current) drug therapy: Secondary | ICD-10-CM | POA: Insufficient documentation

## 2016-11-29 DIAGNOSIS — Z7982 Long term (current) use of aspirin: Secondary | ICD-10-CM | POA: Insufficient documentation

## 2016-11-29 DIAGNOSIS — R569 Unspecified convulsions: Secondary | ICD-10-CM

## 2016-11-29 HISTORY — DX: Headache, unspecified: R51.9

## 2016-11-29 HISTORY — DX: Headache: R51

## 2016-11-29 LAB — CBC WITH DIFFERENTIAL/PLATELET
BASOS ABS: 0 10*3/uL (ref 0.0–0.1)
Basophils Relative: 0 %
EOS ABS: 0 10*3/uL (ref 0.0–0.7)
EOS PCT: 0 %
HCT: 41.6 % (ref 39.0–52.0)
HEMOGLOBIN: 14 g/dL (ref 13.0–17.0)
LYMPHS ABS: 1.3 10*3/uL (ref 0.7–4.0)
Lymphocytes Relative: 19 %
MCH: 32.9 pg (ref 26.0–34.0)
MCHC: 33.7 g/dL (ref 30.0–36.0)
MCV: 97.7 fL (ref 78.0–100.0)
Monocytes Absolute: 0.4 10*3/uL (ref 0.1–1.0)
Monocytes Relative: 5 %
NEUTROS PCT: 76 %
Neutro Abs: 5.3 10*3/uL (ref 1.7–7.7)
PLATELETS: 185 10*3/uL (ref 150–400)
RBC: 4.26 MIL/uL (ref 4.22–5.81)
RDW: 12.9 % (ref 11.5–15.5)
WBC: 7 10*3/uL (ref 4.0–10.5)

## 2016-11-29 LAB — COMPREHENSIVE METABOLIC PANEL
ALT: 12 U/L — AB (ref 17–63)
AST: 24 U/L (ref 15–41)
Albumin: 4.2 g/dL (ref 3.5–5.0)
Alkaline Phosphatase: 51 U/L (ref 38–126)
Anion gap: 11 (ref 5–15)
BILIRUBIN TOTAL: 0.3 mg/dL (ref 0.3–1.2)
BUN: 11 mg/dL (ref 6–20)
CHLORIDE: 107 mmol/L (ref 101–111)
CO2: 21 mmol/L — ABNORMAL LOW (ref 22–32)
CREATININE: 0.99 mg/dL (ref 0.61–1.24)
Calcium: 8.9 mg/dL (ref 8.9–10.3)
Glucose, Bld: 118 mg/dL — ABNORMAL HIGH (ref 65–99)
POTASSIUM: 4.1 mmol/L (ref 3.5–5.1)
Sodium: 139 mmol/L (ref 135–145)
TOTAL PROTEIN: 6.9 g/dL (ref 6.5–8.1)

## 2016-11-29 LAB — RAPID URINE DRUG SCREEN, HOSP PERFORMED
AMPHETAMINES: NOT DETECTED
BENZODIAZEPINES: POSITIVE — AB
Barbiturates: NOT DETECTED
COCAINE: NOT DETECTED
OPIATES: POSITIVE — AB
Tetrahydrocannabinol: POSITIVE — AB

## 2016-11-29 LAB — ETHANOL

## 2016-11-29 MED ORDER — GABAPENTIN 400 MG PO CAPS: 400.0000 mg | ORAL_CAPSULE | Freq: Every day | ORAL | 0 refills | Status: DC

## 2016-11-29 NOTE — ED Notes (Signed)
EDP at bedside updating patient and family. 

## 2016-11-29 NOTE — ED Triage Notes (Signed)
EMS responded to report of seizure, witnessed by family.  Pt was bradycardic and hypotensive on their arrival.  Pt is alert and oriented on arrival to ED.  Given ns by ems.

## 2016-11-29 NOTE — ED Notes (Signed)
Patient given Sprite at this time. Patient states he is feeling better and ready to go home.

## 2016-11-29 NOTE — ED Notes (Signed)
Patient is resting comfortably. 

## 2016-11-29 NOTE — ED Provider Notes (Signed)
[facility]-EMER[facility]Y DEPT Provider Note   CSN: [id number] Arrival date & time: 11/29/16  1455     History   Chief Complaint Chief Complaint  Patient presents with  . Seizures    HPI Alan Jennings is a 35 y.o. male.  HPI  Pt was seen at 1515. Per EMS, family report and pt, c/o sudden onset and resolution of one episode of "seizure" that occurred PTA. Pt states he "doesn't remember what happened." EMS states family witnessed pt having "grand mal seizure" that lasted approximately 1 minute. On EMS arrival to scene: pt was post-ictal, incont urine and stool. EMS states pt was "hypotensive" and "bradycardic" on scene; given IV NS 500ml bolus en route with improvement. Pt states he is "starting to feel like myself" since arrival to the ED. Endorses seizure hx, with last seizure approximately 5 to 6 months ago. States he has been taking his fathers gabapentin 400mg  nightly because he has not gone back to his Neuro MD for rx. Pt believes his seizure was triggered by "not sleeping well" for the past several nights.  Denies any other complaints. Denies CP/palpitations, no SOB/cough, no abd pain, no N/V/D, no neck or back pain, no fevers, no focal motor weakness, no tingling/numbness in extremities.    Neuro: Dr. [place]n [place] Past Medical History:  Diagnosis Date  . Anxiety   . Back pain   . Complication of anesthesia    had a syncopal episode after rhinoplasty  . Depression   . Headache   . Hypertension   . Kidney stone   . Seizures (HCC)   . Seizures [facility](HCC)     Patient Active Problem List   Diagnosis Date Noted  . Muscle weakness (generalized) 09/22/2012  . Neurogenic pain 08/03/2012  . Hypokalemia 06/03/2012  . Left shoulder pain 06/03/2012  . Wound infection after surgery 06/03/2012  . Status post shoulder surgery 05/11/2012  . Shoulder injury 11/13/2011  . Contusion of arm, left 10/08/2011  . Fracture of humerus, proximal, left, closed 08/27/2011  . Fracture, humerus 06/11/2011  .  CLOSED FRACTURE UNSPEC PART UPPER END HUMERUS 02/27/2011    Past Surgical History:  Procedure Laterality Date  . NOSE SURGERY     [facility]  . ORIF HUMERUS FRACTURE  04/26/2012   Procedure: OPEN REDUCTION INTERNAL FIXATION (ORIF) PROXIMAL HUMERUS FRACTURE;  Surgeon: Vickki HearingStanley E Harrison, MD;  Location: [facility] OR[facility];  Service: Orthopedics;  Laterality: Left;  Open Reduction Internal Fixation of Left Proximal Humerus Fracture, Closing Wedge Osteotomy, Bone Graft  . RHINOPLASTY     [facility]  . SHOULDER SURGERY         Home Medications    Prior to Admission medications   Medication Sig Start Date End Date Taking? Authorizing Provider  aspirin-acetaminophen-caffeine (EXCEDRIN EXTRA STRENGTH) [id number] MG tablet Take 1-2 tablets by mouth every 6 (six) hours as needed (for pain).   Yes Historical Provider, MD  Aspirin-Acetaminophen-Caffeine (GOODY HEADACHE PO) Take 1 packet by mouth daily as needed (for pain).   Yes Historical Provider, MD  atenolol (TENORMIN) 50 MG tablet Take 50 mg by mouth daily.   Yes Historical Provider, MD  gabapentin (NEURONTIN) 400 MG capsule Take 400 mg by mouth at bedtime.   Yes Historical Provider, MD    Family History Family History  Problem Relation Age of Onset  . Heart disease    . Arthritis    . Anesthesia problems Neg Hx   . Hypotension Neg Hx   . Malignant hyperthermia Neg Hx   .  Pseudochol deficiency Neg Hx     Social History Social History  Substance Use Topics  . Smoking status: Former Smoker    Packs/day: 0.50    Years: 7.00    Types: Cigarettes    Quit date: 09/19/2011  . Smokeless tobacco: Never Used  . Alcohol use No     Allergies   Tramadol   Review of Systems Review of Systems ROS: Statement: All systems negative except as marked or noted in the HPI; Constitutional: Negative for fever and chills. ; ; Eyes: Negative for eye pain, redness and discharge. ; ; ENMT: Negative for ear pain, hoarseness, nasal congestion, sinus pressure and sore  throat. ; ; Cardiovascular: Negative for chest pain, palpitations, diaphoresis, dyspnea and peripheral edema. ; ; Respiratory: Negative for cough, wheezing and stridor. ; ; Gastrointestinal: Negative for nausea, vomiting, diarrhea, abdominal pain, blood in stool, hematemesis, jaundice and rectal bleeding. . ; ; Genitourinary: Negative for dysuria, flank pain and hematuria. ; ; Musculoskeletal: Negative for back pain and neck pain. Negative for swelling and trauma.; ; Skin: Negative for pruritus, rash, abrasions, blisters, bruising and skin lesion.; ; Neuro: Negative for headache, lightheadedness and neck stiffness. Negative for weakness, extremity weakness, paresthesias. +seizure.       Physical Exam Updated Vital Signs BP 124/60 (BP Location: Right Arm)   Pulse 85   Temp 97.9 F (36.6 C) (Oral)   Resp 18   Ht 5\' 8"  (1.727 m)   Wt 150 lb (68 kg)   SpO2 100%   BMI 22.81 kg/m   Physical Exam 1520: Physical examination:  Nursing notes reviewed; Vital signs and O2 SAT reviewed;  Constitutional: Well developed, Well nourished, Well hydrated, In no acute distress; Head:  Normocephalic, atraumatic; Eyes: EOMI, PERRL, No scleral icterus; ENMT: +very small superficial lac to tip of tongue; hemostatic. Teeth intact. Mouth and pharynx without lesions. No intra-oral edema. No submandibular or sublingual edema. No hoarse voice, no drooling, no stridor. No trismus. Mouth and pharynx normal, Mucous membranes moist; Neck: Supple, Full range of motion, No lymphadenopathy; Cardiovascular: Regular rate and rhythm, No gallop; Respiratory: Breath sounds clear & equal bilaterally, No wheezes.  Speaking full sentences with ease, Normal respiratory effort/excursion; Chest: Nontender, Movement normal; Abdomen: Soft, Nontender, Nondistended, Normal bowel sounds; Genitourinary: No CVA tenderness; Extremities: Pulses normal, No tenderness, No edema, No calf edema or asymmetry.; Neuro: AA&Ox3, Major CN grossly intact.   Speech clear. No gross focal motor or sensory deficits in extremities.; Skin: Color normal, Warm, Dry.   ED Treatments / Results  Labs (all labs ordered are listed, but only abnormal results are displayed)   EKG  EKG Interpretation None       Radiology   Procedures Procedures (including critical care time)  Medications Ordered in ED Medications - No data to display   Initial Impression / Assessment and Plan / ED Course  I have reviewed the triage vital signs and the nursing notes.  Pertinent labs & imaging results that were available during my care of the patient were reviewed by me and considered in my medical decision making (see chart for details).  MDM Reviewed: previous chart, nursing note and vitals Reviewed previous: labs Interpretation: labs   Results for orders placed or performed during the hospital encounter of 11/29/16  Comprehensive metabolic panel  Result Value Ref Range   Sodium 139 135 - 145 mmol/L   Potassium 4.1 3.5 - 5.1 mmol/L   Chloride 107 101 - 111 mmol/L   CO2 21 (L)  22 - 32 mmol/L   Glucose, Bld 118 (H) 65 - 99 mg/dL   BUN 11 6 - 20 mg/dL   Creatinine, Ser 1.61 0.61 - 1.24 mg/dL   Calcium 8.9 8.9 - 09.6 mg/dL   Total Protein 6.9 6.5 - 8.1 g/dL   Albumin 4.2 3.5 - 5.0 g/dL   AST 24 15 - 41 U/L   ALT 12 (L) 17 - 63 U/L   Alkaline Phosphatase 51 38 - 126 U/L   Total Bilirubin 0.3 0.3 - 1.2 mg/dL   GFR calc non Af Amer >60 >60 mL/min   GFR calc Af Amer >60 >60 mL/min   Anion gap 11 5 - 15  CBC with Differential  Result Value Ref Range   WBC 7.0 4.0 - 10.5 K/uL   RBC 4.26 4.22 - 5.81 MIL/uL   Hemoglobin 14.0 13.0 - 17.0 g/dL   HCT 04.5 40.9 - 81.1 %   MCV 97.7 78.0 - 100.0 fL   MCH 32.9 26.0 - 34.0 pg   MCHC 33.7 30.0 - 36.0 g/dL   RDW 91.4 78.2 - 95.6 %   Platelets 185 150 - 400 K/uL   Neutrophils Relative % 76 %   Neutro Abs 5.3 1.7 - 7.7 K/uL   Lymphocytes Relative 19 %   Lymphs Abs 1.3 0.7 - 4.0 K/uL   Monocytes Relative 5  %   Monocytes Absolute 0.4 0.1 - 1.0 K/uL   Eosinophils Relative 0 %   Eosinophils Absolute 0.0 0.0 - 0.7 K/uL   Basophils Relative 0 %   Basophils Absolute 0.0 0.0 - 0.1 K/uL  Ethanol  Result Value Ref Range   Alcohol, Ethyl (B) <5 <5 mg/dL  Urine rapid drug screen (hosp performed)  Result Value Ref Range   Opiates POSITIVE (A) NONE DETECTED   Cocaine NONE DETECTED NONE DETECTED   Benzodiazepines POSITIVE (A) NONE DETECTED   Amphetamines NONE DETECTED NONE DETECTED   Tetrahydrocannabinol POSITIVE (A) NONE DETECTED   Barbiturates NONE DETECTED NONE DETECTED    1645:  UDS positive. Rx for benzos (not found) and opiates ([place] last rx 08/2016) [place] or [place] Cont[place] Substance Dat[place]. Pt has tol PO well while in the ED without N/V. Has ambulated with steady gait. Pt wants to go home now.  T/C to Neurology Dr. Gerilyn Pilgrim, case discussed, including:  HPI, pertinent PM/SHx, VS/PE, dx testing, ED course and treatment:  Agrees with ED treatment, remind pt no driving, rx neurontin at same dose, call ofc on Monday for f/u. Dx and testing d/w pt and family.  Questions answered.  Verb understanding, agreeable to d/c home with outpt f/u.     Final Clinical Impressions(s) / ED Diagnoses   Final diagnoses:  None    New Prescriptions New Prescriptions   No medications on file     Samuel Jester, DO 12/03/16 1603

## 2016-11-29 NOTE — Discharge Instructions (Signed)
Take the prescription as directed.  Do not drive.  Call your regular Neurologist on Monday to schedule a follow up appointment this week.  Return to the Emergency Department immediately sooner if worsening.

## 2017-01-26 ENCOUNTER — Encounter (HOSPITAL_COMMUNITY): Payer: Self-pay | Admitting: Emergency Medicine

## 2017-01-26 ENCOUNTER — Emergency Department (HOSPITAL_COMMUNITY)
Admission: EM | Admit: 2017-01-26 | Discharge: 2017-01-26 | Disposition: A | Discharge status: Home / Self Care | Payer: Medicaid Other | Attending: Dermatology | Admitting: Dermatology

## 2017-01-26 DIAGNOSIS — Z5321 Procedure and treatment not carried out due to patient leaving prior to being seen by health care provider: Secondary | ICD-10-CM | POA: Diagnosis not present

## 2017-01-26 DIAGNOSIS — F17210 Nicotine dependence, cigarettes, uncomplicated: Secondary | ICD-10-CM | POA: Diagnosis not present

## 2017-01-26 DIAGNOSIS — I10 Essential (primary) hypertension: Secondary | ICD-10-CM | POA: Insufficient documentation

## 2017-01-26 DIAGNOSIS — R109 Unspecified abdominal pain: Secondary | ICD-10-CM | POA: Diagnosis present

## 2017-01-26 DIAGNOSIS — Z7982 Long term (current) use of aspirin: Secondary | ICD-10-CM | POA: Insufficient documentation

## 2017-01-26 LAB — URINALYSIS, ROUTINE W REFLEX MICROSCOPIC
Bacteria, UA: NONE SEEN
Bilirubin Urine: NEGATIVE
Glucose, UA: NEGATIVE mg/dL
Ketones, ur: NEGATIVE mg/dL
Leukocytes, UA: NEGATIVE
Nitrite: NEGATIVE
Protein, ur: NEGATIVE mg/dL
Specific Gravity, Urine: 1.003 — ABNORMAL LOW (ref 1.005–1.030)
pH: 7 (ref 5.0–8.0)

## 2017-01-26 NOTE — ED Triage Notes (Signed)
Pt not in waiting area 

## 2017-01-26 NOTE — ED Triage Notes (Signed)
Pt reports right flank pain for 2 days with right sided testicle pain.  Burning with urination and nausea/vomiting.

## 2017-01-27 ENCOUNTER — Emergency Department (HOSPITAL_COMMUNITY): Payer: Medicaid Other

## 2017-01-27 ENCOUNTER — Encounter (HOSPITAL_COMMUNITY): Payer: Self-pay | Admitting: Emergency Medicine

## 2017-01-27 ENCOUNTER — Emergency Department (HOSPITAL_COMMUNITY)
Admission: EM | Admit: 2017-01-27 | Discharge: 2017-01-27 | Disposition: A | Discharge status: Home / Self Care | Payer: Medicaid Other | Attending: Emergency Medicine | Admitting: Emergency Medicine

## 2017-01-27 DIAGNOSIS — I10 Essential (primary) hypertension: Secondary | ICD-10-CM | POA: Insufficient documentation

## 2017-01-27 DIAGNOSIS — F17210 Nicotine dependence, cigarettes, uncomplicated: Secondary | ICD-10-CM | POA: Diagnosis not present

## 2017-01-27 DIAGNOSIS — R109 Unspecified abdominal pain: Secondary | ICD-10-CM | POA: Insufficient documentation

## 2017-01-27 DIAGNOSIS — Z7982 Long term (current) use of aspirin: Secondary | ICD-10-CM | POA: Diagnosis not present

## 2017-01-27 LAB — BASIC METABOLIC PANEL WITH GFR
Anion gap: 12 (ref 5–15)
BUN: 8 mg/dL (ref 6–20)
CO2: 23 mmol/L (ref 22–32)
Calcium: 9.9 mg/dL (ref 8.9–10.3)
Chloride: 103 mmol/L (ref 101–111)
Creatinine, Ser: 0.68 mg/dL (ref 0.61–1.24)
GFR calc Af Amer: >60 mL/min (ref >60)
GFR calc non Af Amer: >60 mL/min (ref >60)
Glucose, Bld: 98 mg/dL (ref 65–99)
Potassium: 4 mmol/L (ref 3.5–5.1)
Sodium: 138 mmol/L (ref 135–145)

## 2017-01-27 LAB — URINALYSIS, ROUTINE W REFLEX MICROSCOPIC
Bacteria, UA: NONE SEEN
Bilirubin Urine: NEGATIVE
Glucose, UA: NEGATIVE mg/dL
Ketones, ur: 20 mg/dL — AB
Leukocytes, UA: NEGATIVE
Nitrite: NEGATIVE
Protein, ur: NEGATIVE mg/dL
Specific Gravity, Urine: 1.005 (ref 1.005–1.030)
pH: 6 (ref 5.0–8.0)

## 2017-01-27 LAB — CBC
HEMATOCRIT: 47.7 % (ref 39.0–52.0)
HEMOGLOBIN: 16.6 g/dL (ref 13.0–17.0)
MCH: 33 pg (ref 26.0–34.0)
MCHC: 34.8 g/dL (ref 30.0–36.0)
MCV: 94.8 fL (ref 78.0–100.0)
Platelets: 236 10*3/uL (ref 150–400)
RBC: 5.03 MIL/uL (ref 4.22–5.81)
RDW: 13 % (ref 11.5–15.5)
WBC: 8.6 10*3/uL (ref 4.0–10.5)

## 2017-01-27 MED ORDER — KETOROLAC TROMETHAMINE 30 MG/ML IJ SOLN
30.0000 mg | Freq: Once | INTRAMUSCULAR | Status: AC
Start: 2017-01-27 — End: 2017-01-27
  Administered 2017-01-27: 30 mg via INTRAVENOUS
  Filled 2017-01-27: qty 1

## 2017-01-27 MED ORDER — SODIUM CHLORIDE 0.9 % IV BOLUS (SEPSIS)
1000.0000 mL | Freq: Once | INTRAVENOUS | Status: AC
  Administered 2017-01-27: 1000 mL via INTRAVENOUS

## 2017-01-27 MED ORDER — ONDANSETRON HCL 4 MG/2ML IJ SOLN
4.0000 mg | Freq: Once | INTRAMUSCULAR | Status: AC
  Administered 2017-01-27: 4 mg via INTRAVENOUS
  Filled 2017-01-27: qty 2

## 2017-01-27 NOTE — ED Notes (Signed)
Patient transported to CT 

## 2017-01-27 NOTE — ED Notes (Addendum)
Went into patient room for reassessment of pain medication patient was not in room, IV with catheter in tact noted to be in trash can with fluids draining into trash can. MD made aware.

## 2017-01-27 NOTE — ED Triage Notes (Signed)
Pt was here yesterday for same but left after triage.  Pt having right flank pain, right testicle pain, burning with urination, and n/v for 3 days.

## 2017-01-27 NOTE — ED Provider Notes (Signed)
[facility] Provider Note   CSN: [id number] Arrival date & time: 01/27/17  7829  By signing my name below, I, Teofilo Pod, attest that this documentation has been prepared under the direction and in the presence of Lavera Guise, MD . Electronically Signed: Teofilo Pod, ED Scribe. 01/27/2017. 12:12 PM.    History   Chief Complaint Chief Complaint  Patient presents with  . Flank Pain     The history is provided by the patient. No language interpreter was used.   HPI Comments:  Alan Jennings is a 36 y.o. male with PMHx of kidney stones who presents to the Emergency Department complaining of constant right flank pain radiating to the right testicle x 2 days. Pt states that it feels like it is being squeezed. Pt states that he was up all night with pain. Pt complains of associated right flank pain, nausea, and vomiting. Pt states that a few days ago his urine was dark, but has been getting lighter. Pt was seen yesterday for the same and gave a urine sample. Pt has not seen a urologist, and denies any past abdominal surgeries. No alleviating factors noted. Pt denies testicular swelling, fever, diarrhea.    Past Medical History:  Diagnosis Date  . Anxiety   . Back pain   . Complication of anesthesia    had a syncopal episode after rhinoplasty  . Depression   . Headache   . Hypertension   . Kidney stone   . Seizures (HCC)   . Seizures [facility])     Patient Active Problem List   Diagnosis Date Noted  . Muscle weakness (generalized) 09/22/2012  . Neurogenic pain 08/03/2012  . Hypokalemia 06/03/2012  . Left shoulder pain 06/03/2012  . Wound infection after surgery 06/03/2012  . Status post shoulder surgery 05/11/2012  . Shoulder injury 11/13/2011  . Contusion of arm, left 10/08/2011  . Fracture of humerus, proximal, left, closed 08/27/2011  . Fracture, humerus 06/11/2011  . CLOSED FRACTURE UNSPEC PART UPPER END HUMERUS 02/27/2011    Past Surgical History:    Procedure Laterality Date  . NOSE SURGERY     [facility]  . ORIF HUMERUS FRACTURE  04/26/2012   Procedure: OPEN REDUCTION INTERNAL FIXATION (ORIF) PROXIMAL HUMERUS FRACTURE;  Surgeon: Vickki Hearing, MD;  Location: [facility] ORS;  Service: Orthopedics;  Laterality: Left;  Open Reduction Internal Fixation of Left Proximal Humerus Fracture, Closing Wedge Osteotomy, Bone Graft  . RHINOPLASTY     [facility]  . SHOULDER SURGERY         Home Medications    Prior to Admission medications   Medication Sig Start Date End Date Taking? Authorizing Provider  aspirin-acetaminophen-caffeine (EXCEDRIN EXTRA STRENGTH) [phone] MG tablet Take 1-2 tablets by mouth every 6 (six) hours as needed (for pain).   Yes Historical Provider, MD  Aspirin-Acetaminophen-Caffeine (GOODY HEADACHE PO) Take 2 packets by mouth daily as needed (for pain).    Yes Historical Provider, MD  gabapentin (NEURONTIN) 400 MG capsule Take 1 capsule (400 mg total) by mouth at bedtime. 11/29/16  Yes Samuel Jester, DO    Family History Family History  Problem Relation Age of Onset  . Heart disease    . Arthritis    . Anesthesia problems Neg Hx   . Hypotension Neg Hx   . Malignant hyperthermia Neg Hx   . Pseudochol deficiency Neg Hx     Social History Social History  Substance Use Topics  . Smoking status: Current Every Day Smoker  Packs/day: 0.50    Years: 7.00    Types: Cigarettes  . Smokeless tobacco: Never Used  . Alcohol use No     Allergies   Tramadol   Review of Systems Review of Systems 10/14 systems reviewed and all are negative for acute change except as noted in the HPI.    Physical Exam Updated Vital Signs BP 145/84 (BP Location: Right Arm)   Pulse 85   Temp 97.7 F (36.5 C) (Oral)   Resp 16   Ht 5\' 9"  (1.753 m)   Wt 145 lb (65.8 kg)   SpO2 100%   BMI 21.41 kg/m   Physical Exam Physical Exam  Nursing note and vitals reviewed. Constitutional: Well developed, well nourished, non-toxic, and in no  acute distress Head: Normocephalic and atraumatic.  Mouth/Throat: Oropharynx is clear and moist.  Neck: Normal range of motion. Neck supple.  Cardiovascular: Normal rate and regular rhythm.   Pulmonary/Chest: Effort normal and breath sounds normal.  Abdominal: Soft. There is no tenderness. There is no rebound and no guarding. Right CVA tenderness.  GU: Normal appearance of external genitalia, bilateral cremasteric reflexes. TTP of the right testicle without swelling. Normal bilateral testicular lie.  Musculoskeletal: Normal range of motion.  Neurological: Alert, no facial droop, fluent speech, moves all extremities symmetrically Skin: Skin is warm and dry.  Psychiatric: Cooperative   ED Treatments / Results  DIAGNOSTIC STUDIES:  Oxygen Saturation is 100% on RA, normal by my interpretation.    COORDINATION OF CARE:  12:11 PM Will order CT scan and UA. Discussed treatment plan with pt at bedside and pt agreed to plan.   Labs (all labs ordered are listed, but only abnormal results are displayed) Labs Reviewed  URINALYSIS, ROUTINE W REFLEX MICROSCOPIC - Abnormal; Notable for the following:       Result Value   Color, Urine STRAW (*)    Hgb urine dipstick MODERATE (*)    Ketones, ur 20 (*)    All other components within normal limits  CBC  BASIC METABOLIC PANEL    EKG  EKG Interpretation None       Radiology Ct Renal Stone Study  Result Date: 01/27/2017 CLINICAL DATA:  Right flank pain, right testicle pain EXAM: CT ABDOMEN AND PELVIS WITHOUT CONTRAST TECHNIQUE: Multidetector CT imaging of the abdomen and pelvis was performed following the standard protocol without IV contrast. COMPARISON:  09/06/2016 FINDINGS: Lower chest: Lung bases shows no acute findings. Hepatobiliary: No focal liver abnormality is seen. No gallstones, gallbladder wall thickening, or biliary dilatation. Pancreas: Unremarkable. No pancreatic ductal dilatation or surrounding inflammatory changes. Spleen:  Normal in size without focal abnormality. Adrenals/Urinary Tract: No adrenal gland mass. No nephrolithiasis. No hydronephrosis or hydroureter. No calcified ureteral calculi. No calcified calculi are noted within urinary bladder. Bilateral distal ureter is unremarkable. No urinary bladder filling defects. No calcified calculi are noted within urinary bladder. Stomach/Bowel: The study is limited without IV and oral contrast. There is no gastric outlet obstruction. No small bowel obstruction. No thickened or dilated small bowel loops. There is a low lying cecum. No pericecal inflammation. There is some air within terminal ileum. The appendix is not identified. No colonic obstruction. No evidence of colitis or diverticulitis on this unenhanced scan. There is some colonic stool in transverse colon and hepatic flexure of the colon. Vascular/Lymphatic: No aortic aneurysm. No retroperitoneal or mesenteric adenopathy. Reproductive: Prostate gland unremarkable. No pelvic mass or adenopathy. Other: No ascites or free abdominal air. Musculoskeletal: No destructive bony lesions  are noted. IMPRESSION: 1. No evidence of nephrolithiasis. No hydronephrosis or hydroureter. 2. No calcified ureteral calculi. No calcified calculi are noted within urinary bladder. No urinary bladder filling defects. 3. Limited examination without IV or oral contrast. A low lying cecum again noted. No pericecal inflammation. No small bowel obstruction. The appendix is not identified. 4. No evidence of colitis or diverticulitis. No distal colonic obstruction. Electronically Signed   By: Natasha Mead M.D.   On: 01/27/2017 13:10    Procedures Procedures (including critical care time)  Medications Ordered in ED Medications  sodium chloride 0.9 % bolus 1,000 mL (0 mLs Intravenous Stopped 01/27/17 1314)  ondansetron (ZOFRAN) injection 4 mg (4 mg Intravenous Given 01/27/17 1238)  ketorolac (TORADOL) 30 MG/ML injection 30 mg (30 mg Intravenous Given 01/27/17  1238)     Initial Impression / Assessment and Plan / ED Course  I have reviewed the triage vital signs and the nursing notes.  Pertinent labs & imaging results that were available during my care of the patient were reviewed by me and considered in my medical decision making (see chart for details).     Presenting with right flank pain to right testicle x 2 days. Old records reviewed, multiple ED visits with similar symptoms and with subsequent CT/scrotal ultrasound showing no acute pathology. Normal testicular exam, other than tenderness to right testicle. Abdomen soft and benign with right CVA tenderness. Blood work with normal renal function. Urine with blood but no infection. Discussed evaluation of testicular pain w/ [place] but first ruling out potential kidney stone with CT. Discussed only given toradol for pain to start until return from CT. Subsequently, patient eloped from emergency department prior to work-up. CT reveiwed after patient eloped, and shows no kidney stone or other acute processes.     Final Clinical Impressions(s) / ED Diagnoses   Final diagnoses:  Right flank pain    New Prescriptions New Prescriptions   No medications on file   I personally performed the services described in this documentation, which was scribed in my presence. The recorded information has been reviewed and is accurate.     Lavera Guise, MD 01/27/17 1344

## 2017-05-08 MED FILL — SUBOXONE 8 MG-2 MG SL FILM: 8-2 | 7 days supply | Qty: 14 | Fill #0

## 2017-05-15 MED FILL — SUBOXONE 8 MG-2 MG SL FILM: 8-2 | 7 days supply | Qty: 18 | Fill #0

## 2017-05-21 MED FILL — SUBOXONE 8 MG-2 MG SL FILM: 8-2 | 7 days supply | Qty: 21 | Fill #0

## 2017-05-26 MED FILL — SUBOXONE 8 MG-2 MG SL FILM: 8-2 | 14 days supply | Qty: 42 | Fill #0

## 2017-06-09 MED FILL — SUBOXONE 8 MG-2 MG SL FILM: 8-2 | 28 days supply | Qty: 84 | Fill #0

## 2017-07-07 MED FILL — SUBOXONE 8 MG-2 MG SL FILM: 8-2 | 7 days supply | Qty: 21 | Fill #0

## 2017-07-14 MED FILL — SUBOXONE 8 MG-2 MG SL FILM: 8-2 | 14 days supply | Qty: 42 | Fill #0

## 2017-07-28 MED FILL — SUBOXONE 8 MG-2 MG SL FILM: 8-2 | 28 days supply | Qty: 84 | Fill #0

## 2017-08-19 ENCOUNTER — Emergency Department (HOSPITAL_COMMUNITY)
Admission: EM | Admit: 2017-08-19 | Discharge: 2017-08-19 | Disposition: A | Discharge status: Home / Self Care | Payer: Medicaid Other | Attending: Emergency Medicine | Admitting: Emergency Medicine

## 2017-08-19 ENCOUNTER — Encounter (HOSPITAL_COMMUNITY): Payer: Self-pay | Admitting: Emergency Medicine

## 2017-08-19 ENCOUNTER — Emergency Department (HOSPITAL_COMMUNITY): Payer: Medicaid Other

## 2017-08-19 DIAGNOSIS — R0602 Shortness of breath: Secondary | ICD-10-CM | POA: Diagnosis not present

## 2017-08-19 DIAGNOSIS — I10 Essential (primary) hypertension: Secondary | ICD-10-CM | POA: Diagnosis not present

## 2017-08-19 DIAGNOSIS — R079 Chest pain, unspecified: Secondary | ICD-10-CM | POA: Diagnosis present

## 2017-08-19 DIAGNOSIS — E876 Hypokalemia: Secondary | ICD-10-CM | POA: Insufficient documentation

## 2017-08-19 DIAGNOSIS — F17210 Nicotine dependence, cigarettes, uncomplicated: Secondary | ICD-10-CM | POA: Insufficient documentation

## 2017-08-19 DIAGNOSIS — Z79899 Other long term (current) drug therapy: Secondary | ICD-10-CM | POA: Insufficient documentation

## 2017-08-19 DIAGNOSIS — R002 Palpitations: Secondary | ICD-10-CM | POA: Diagnosis not present

## 2017-08-19 DIAGNOSIS — F419 Anxiety disorder, unspecified: Secondary | ICD-10-CM | POA: Diagnosis not present

## 2017-08-19 LAB — BASIC METABOLIC PANEL
Anion gap: 9 (ref 5–15)
BUN: 7 mg/dL (ref 6–20)
CALCIUM: 9.1 mg/dL (ref 8.9–10.3)
CO2: 26 mmol/L (ref 22–32)
CREATININE: 0.98 mg/dL (ref 0.61–1.24)
Chloride: 107 mmol/L (ref 101–111)
GFR calc non Af Amer: >60 mL/min (ref >60)
GLUCOSE: 102 mg/dL — AB (ref 65–99)
Potassium: 3.1 mmol/L — ABNORMAL LOW (ref 3.5–5.1)
Sodium: 142 mmol/L (ref 135–145)

## 2017-08-19 LAB — CBC
HCT: 39.3 % (ref 39.0–52.0)
HEMOGLOBIN: 13.3 g/dL (ref 13.0–17.0)
MCH: 31.8 pg (ref 26.0–34.0)
MCHC: 33.8 g/dL (ref 30.0–36.0)
MCV: 94 fL (ref 78.0–100.0)
Platelets: 257 10*3/uL (ref 150–400)
RBC: 4.18 MIL/uL — ABNORMAL LOW (ref 4.22–5.81)
RDW: 13.2 % (ref 11.5–15.5)
WBC: 7.8 10*3/uL (ref 4.0–10.5)

## 2017-08-19 LAB — I-STAT TROPONIN, ED: TROPONIN I, POC: 0 ng/mL (ref 0.00–0.08)

## 2017-08-19 LAB — TSH: TSH: 0.491 u[IU]/mL (ref 0.350–4.500)

## 2017-08-19 MED ORDER — ASPIRIN 81 MG PO CHEW
324.0000 mg | CHEWABLE_TABLET | Freq: Once | ORAL | Status: AC
  Administered 2017-08-19: 324 mg via ORAL
  Filled 2017-08-19: qty 4

## 2017-08-19 MED ORDER — SODIUM CHLORIDE 0.9 % IV SOLN
INTRAVENOUS | Status: DC
  Administered 2017-08-19: 10:00:00 via INTRAVENOUS

## 2017-08-19 MED ORDER — DIAZEPAM 5 MG PO TABS: 5.0000 mg | ORAL_TABLET | Freq: Every day | ORAL | 0 refills | Status: DC

## 2017-08-19 MED ORDER — DIAZEPAM 5 MG PO TABS
5.0000 mg | ORAL_TABLET | Freq: Once | ORAL | Status: AC
  Administered 2017-08-19: 5 mg via ORAL
  Filled 2017-08-19: qty 1

## 2017-08-19 MED ORDER — POTASSIUM CHLORIDE CRYS ER 20 MEQ PO TBCR
40.0000 meq | EXTENDED_RELEASE_TABLET | Freq: Once | ORAL | Status: AC
  Administered 2017-08-19: 40 meq via ORAL
  Filled 2017-08-19: qty 2

## 2017-08-19 MED ORDER — PAROXETINE HCL 10 MG PO TABS: 10.0000 mg | ORAL_TABLET | Freq: Every day | ORAL | 0 refills | Status: DC

## 2017-08-19 NOTE — ED Triage Notes (Signed)
Pt reports history of same for last several months. Pt reports "i have sudden onset of chest pain and then I feel real weak." pt reports has not been able to keep anything down for last several days due to vomiting. Pt also reports several loc episodes.

## 2017-08-19 NOTE — ED Provider Notes (Signed)
[facility]-EMERG[facility]Y DEPT Provider Note   CSN: [id number] Arrival date & time: 08/19/17  0848     History   Chief Complaint Chief Complaint  Patient presents with  . Chest Pain    HPI Alan Jennings is a 36 y.o. male with past medical history as outlined below presenting with a two-month history of intermittent episodes of palpitations, shortness of breath and increasing anxiety, followed by tingling in his extremities, sometimes culminating in syncope with last episode occurring this morning.  He describes having a new therapist in [place] for the past 2 months and feels his anxiety is worsening as old bad memories are surfacing, endorses increased anxiety and insomnia.  He denies chest pain, but describes pressure sensation during the palpitation in short of breath episodes.  Denies substance abuse other than occasional marijuana, last smoked 3 weeks ago.  He does not consume caffeine.  No EtOH.  No family history of early cardiac history or thyroid history.  The history is provided by the patient.    Past Medical History:  Diagnosis Date  . Anxiety   . Back pain   . Complication of anesthesia    had a syncopal episode after rhinoplasty  . Depression   . Headache   . Hypertension   . Kidney stone   . Seizures (HCC)   . Seizures [facility])     Patient Active Problem List   Diagnosis Date Noted  . Muscle weakness (generalized) 09/22/2012  . Neurogenic pain 08/03/2012  . Hypokalemia 06/03/2012  . Left shoulder pain 06/03/2012  . Wound infection after surgery 06/03/2012  . Status post shoulder surgery 05/11/2012  . Shoulder injury 11/13/2011  . Contusion of arm, left 10/08/2011  . Fracture of humerus, proximal, left, closed 08/27/2011  . Fracture, humerus 06/11/2011  . CLOSED FRACTURE UNSPEC PART UPPER END HUMERUS 02/27/2011    Past Surgical History:  Procedure Laterality Date  . NOSE SURGERY     [facility]  . ORIF HUMERUS FRACTURE  04/26/2012   Procedure: OPEN REDUCTION INTERNAL  FIXATION (ORIF) PROXIMAL HUMERUS FRACTURE;  Surgeon: Vickki Hearing, MD;  Location: [facility];  Service: Orthopedics;  Laterality: Left;  Open Reduction Internal Fixation of Left Proximal Humerus Fracture, Closing Wedge Osteotomy, Bone Graft  . RHINOPLASTY     [facility]  . SHOULDER SURGERY         Home Medications    Prior to Admission medications   Medication Sig Start Date End Date Taking? Authorizing Provider  aspirin-acetaminophen-caffeine (EXCEDRIN EXTRA STRENGTH) [phone] MG tablet Take 1-2 tablets by mouth every 6 (six) hours as needed (for pain).   Yes [provider]  Aspirin-Acetaminophen-Caffeine (GOODY HEADACHE PO) Take 2 packets by mouth daily as needed (for pain).    Yes [provider]  diazepam (VALIUM) 5 MG tablet Take 1 tablet (5 mg total) by mouth at bedtime. 08/19/17   Burgess Amor, PA-C  PARoxetine (PAXIL) 10 MG tablet Take 1 tablet (10 mg total) by mouth daily. 08/19/17   Burgess Amor, PA-C    Family History Family History  Problem Relation Age of Onset  . Heart disease Unknown   . Arthritis Unknown   . Anesthesia problems Neg Hx   . Hypotension Neg Hx   . Malignant hyperthermia Neg Hx   . Pseudochol deficiency Neg Hx     Social History Social History  Substance Use Topics  . Smoking status: Current Every Day Smoker    Packs/day: 0.50    Years: 7.00  Types: Cigarettes  . Smokeless tobacco: Never Used  . Alcohol use No     Allergies   Tramadol   Review of Systems Review of Systems  Constitutional: Negative for fever.  HENT: Negative for congestion and sore throat.   Eyes: Negative.   Respiratory: Positive for chest tightness and shortness of breath.   Cardiovascular: Positive for palpitations. Negative for chest pain and leg swelling.  Gastrointestinal: Negative for abdominal pain and nausea.  Genitourinary: Negative.   Musculoskeletal: Negative for arthralgias, joint swelling and neck pain.  Skin: Negative.  Negative for rash  and wound.  Neurological: Positive for light-headedness and numbness. Negative for dizziness, weakness and headaches.  Psychiatric/Behavioral: Positive for sleep disturbance. The patient is nervous/anxious.      Physical Exam Updated Vital Signs BP 120/76   Pulse 62   Temp 98.6 F (37 C) (Oral)   Resp (!) 22   Ht 5\' 8"  (1.727 m)   Wt 72.6 kg (160 lb)   SpO2 97%   BMI 24.33 kg/m   Physical Exam  Constitutional: He appears well-developed and well-nourished.  HENT:  Head: Normocephalic and atraumatic.  Eyes: Conjunctivae are normal.  Neck: Normal range of motion.  Cardiovascular: Regular rhythm, normal heart sounds and intact distal pulses.  Tachycardia present.   Pulmonary/Chest: Effort normal and breath sounds normal. He has no wheezes.  Abdominal: Soft. Bowel sounds are normal. There is no tenderness.  Musculoskeletal: Normal range of motion.  Neurological: He is alert.  Skin: Skin is warm and dry.  Psychiatric: His speech is normal and behavior is normal. Thought content normal. His mood appears anxious. Cognition and memory are normal. He expresses no suicidal plans and no homicidal plans.  Nursing note and vitals reviewed.    ED Treatments / Results  Labs (all labs ordered are listed, but only abnormal results are displayed) Labs Reviewed  BASIC METABOLIC PANEL - Abnormal; Notable for the following:       Result Value   Potassium 3.1 (*)    Glucose, Bld 102 (*)    All other components within normal limits  CBC - Abnormal; Notable for the following:    RBC 4.18 (*)    All other components within normal limits  TSH  I-STAT TROPONIN, ED    EKG  ED ECG REPORT   Date: 08/19/2017  Rate: 61  Rhythm: normal sinus rhythm  QRS Axis: normal  Intervals: normal  ST/T Wave abnormalities: normal  Conduction Disutrbances:none  Narrative Interpretation:   Old EKG Reviewed: changes noted  I have personally reviewed the EKG tracing and agree with the computerized  printout as noted.   Radiology Dg Chest Portable 1 View  Result Date: 08/19/2017 CLINICAL DATA:  Chest tightness and palpitations. EXAM: PORTABLE CHEST 1 VIEW COMPARISON:  Chest x-ray dated June 22, 2012. FINDINGS: The cardiomediastinal silhouette is normal in size. Normal pulmonary vascularity. No focal consolidation, pleural effusion, or pneumothorax. No acute osseous abnormality. Partially visualized left proximal humerus hardware. IMPRESSION: No active disease. Electronically Signed   By: Obie DredgeWilliam T Derry M.D.   On: 08/19/2017 10:02    Procedures Procedures (including critical care time)  Medications Ordered in ED Medications  0.9 %  sodium chloride infusion ( Intravenous New Bag/Given 08/19/17 0943)  aspirin chewable tablet 324 mg (324 mg Oral Given 08/19/17 0943)  diazepam (VALIUM) tablet 5 mg (5 mg Oral Given 08/19/17 0943)  potassium chloride SA (K-DUR,KLOR-CON) CR tablet 40 mEq (40 mEq Oral Given 08/19/17 1040)  Initial Impression / Assessment and Plan / ED Course  I have reviewed the triage vital signs and the nursing notes.  Pertinent labs & imaging results that were available during my care of the patient were reviewed by me and considered in my medical decision making (see chart for details).     Pt with anxiety/panic with no labs or ekg findings to suggest cardiac source of sx.  He was given valium here with resolution of tachycardia and patient feeling much more calm and relaxed.  He was given resource guide for obtaining ongoing care/ psychiatry perhaps to continue with medications if helpful.  Will give short course of valium for qhs use for better sleep, paxil started for anxiety after discussed with Dr. Estell Harpin.  Prn f/u anticipated.  No SI/HI/  Pt felt improved at time of dc.   Final Clinical Impressions(s) / ED Diagnoses   Final diagnoses:  Palpitations  Anxiety  Hypokalemia    New Prescriptions New Prescriptions   DIAZEPAM (VALIUM) 5 MG TABLET    Take 1 tablet (5  mg total) by mouth at bedtime.   PAROXETINE (PAXIL) 10 MG TABLET    Take 1 tablet (10 mg total) by mouth daily.     Burgess Amor, PA-C 08/19/17 1144    Bethann Berkshire, MD 08/20/17 [phone]

## 2017-08-19 NOTE — ED Notes (Signed)
Pt has recently experience a lot of family stress and is seeing a Veterinary surgeoncounselor. States these episodes didn't start until then.

## 2017-08-19 NOTE — Discharge Instructions (Signed)
Take the medicines as prescribed and follow up as discussed.  I have provided some resources that may be helpful. Try eating a banana or drinking orange juice daily for the next 1-2 weeks to help improve your potassium level.

## 2017-08-25 MED FILL — SUBOXONE 8 MG-2 MG SL FILM: 8-2 | 28 days supply | Qty: 84 | Fill #0

## 2017-09-22 MED FILL — SUBOXONE 8 MG-2 MG SL FILM: 8-2 | 28 days supply | Qty: 84 | Fill #0

## 2017-10-20 MED FILL — SUBOXONE 8 MG-2 MG SL FILM: 8-2 | 21 days supply | Qty: 63 | Fill #0

## 2017-11-10 MED FILL — SUBOXONE 8 MG-2 MG SL FILM: 8-2 | 34 days supply | Qty: 102 | Fill #0

## 2017-12-07 MED FILL — SUBOXONE 8 MG-2 MG SL FILM: 8-2 | 1 days supply | Qty: 3 | Fill #1

## 2017-12-16 MED FILL — SUBOXONE 8 MG-2 MG SL FILM: 8-2 | 28 days supply | Qty: 84 | Fill #0

## 2018-01-12 MED FILL — SUBOXONE 8 MG-2 MG SL FILM: 8-2 | 28 days supply | Qty: 84 | Fill #0

## 2018-02-09 MED FILL — SUBOXONE 8 MG-2 MG SL FILM: 8-2 | 30 days supply | Qty: 90 | Fill #0

## 2018-03-11 MED FILL — SUBOXONE 8 MG-2 MG SL FILM: 8-2 | 28 days supply | Qty: 84 | Fill #0

## 2018-04-04 ENCOUNTER — Encounter (HOSPITAL_COMMUNITY): Payer: Self-pay | Admitting: *Deleted

## 2018-04-04 ENCOUNTER — Other Ambulatory Visit: Payer: Self-pay

## 2018-04-04 ENCOUNTER — Emergency Department (HOSPITAL_COMMUNITY): Payer: Medicaid Other

## 2018-04-04 ENCOUNTER — Emergency Department (HOSPITAL_COMMUNITY)
Admission: EM | Admit: 2018-04-04 | Discharge: 2018-04-05 | Disposition: A | Discharge status: Home / Self Care | Payer: Medicaid Other | Attending: Emergency Medicine | Admitting: Emergency Medicine

## 2018-04-04 DIAGNOSIS — F17210 Nicotine dependence, cigarettes, uncomplicated: Secondary | ICD-10-CM | POA: Insufficient documentation

## 2018-04-04 DIAGNOSIS — Z79899 Other long term (current) drug therapy: Secondary | ICD-10-CM | POA: Insufficient documentation

## 2018-04-04 DIAGNOSIS — I10 Essential (primary) hypertension: Secondary | ICD-10-CM | POA: Diagnosis not present

## 2018-04-04 DIAGNOSIS — K920 Hematemesis: Secondary | ICD-10-CM | POA: Diagnosis present

## 2018-04-04 DIAGNOSIS — K2900 Acute gastritis without bleeding: Secondary | ICD-10-CM | POA: Insufficient documentation

## 2018-04-04 LAB — CBC WITH DIFFERENTIAL/PLATELET
Basophils Absolute: 0 10*3/uL (ref 0.0–0.1)
Basophils Relative: 0 %
Eosinophils Absolute: 0.1 10*3/uL (ref 0.0–0.7)
Eosinophils Relative: 1 %
HCT: 39.9 % (ref 39.0–52.0)
HEMOGLOBIN: 13.3 g/dL (ref 13.0–17.0)
LYMPHS ABS: 3.9 10*3/uL (ref 0.7–4.0)
Lymphocytes Relative: 45 %
MCH: 30.9 pg (ref 26.0–34.0)
MCHC: 33.3 g/dL (ref 30.0–36.0)
MCV: 92.6 fL (ref 78.0–100.0)
MONO ABS: 0.6 10*3/uL (ref 0.1–1.0)
MONOS PCT: 7 %
NEUTROS ABS: 4.1 10*3/uL (ref 1.7–7.7)
NEUTROS PCT: 47 %
Platelets: 237 10*3/uL (ref 150–400)
RBC: 4.31 MIL/uL (ref 4.22–5.81)
RDW: 13 % (ref 11.5–15.5)
WBC: 8.6 10*3/uL (ref 4.0–10.5)

## 2018-04-04 LAB — POC OCCULT BLOOD, ED: Fecal Occult Bld: NEGATIVE

## 2018-04-04 MED ORDER — SODIUM CHLORIDE 0.9 % IV BOLUS
1000.0000 mL | Freq: Once | INTRAVENOUS | Status: AC
  Administered 2018-04-04: 1000 mL via INTRAVENOUS

## 2018-04-04 MED ORDER — FAMOTIDINE IN NACL 20-0.9 MG/50ML-% IV SOLN
20.0000 mg | Freq: Two times a day (BID) | INTRAVENOUS | Status: DC
  Administered 2018-04-04: 20 mg via INTRAVENOUS
  Filled 2018-04-04: qty 50

## 2018-04-04 MED ORDER — IOPAMIDOL (ISOVUE-300) INJECTION 61%
100.0000 mL | Freq: Once | INTRAVENOUS | Status: AC | PRN
  Administered 2018-04-05: 100 mL via INTRAVENOUS

## 2018-04-04 MED ORDER — ONDANSETRON HCL 4 MG/2ML IJ SOLN
4.0000 mg | Freq: Once | INTRAMUSCULAR | Status: AC
  Administered 2018-04-04: 4 mg via INTRAVENOUS
  Filled 2018-04-04: qty 2

## 2018-04-04 NOTE — ED Triage Notes (Signed)
Pt c/o abd pain, throwing up blood that started yesterday, pt states that he has had problems with his stomach before but has not throw up blood.

## 2018-04-05 ENCOUNTER — Emergency Department (HOSPITAL_COMMUNITY): Payer: Medicaid Other

## 2018-04-05 LAB — URINALYSIS, ROUTINE W REFLEX MICROSCOPIC
BILIRUBIN URINE: NEGATIVE
Bacteria, UA: NONE SEEN
GLUCOSE, UA: NEGATIVE mg/dL
Ketones, ur: NEGATIVE mg/dL
LEUKOCYTES UA: NEGATIVE
NITRITE: NEGATIVE
PROTEIN: NEGATIVE mg/dL
Specific Gravity, Urine: 1.018 (ref 1.005–1.030)
pH: 6 (ref 5.0–8.0)

## 2018-04-05 LAB — COMPREHENSIVE METABOLIC PANEL
ALBUMIN: 4.1 g/dL (ref 3.5–5.0)
ALK PHOS: 61 U/L (ref 38–126)
ALT: 13 U/L — ABNORMAL LOW (ref 17–63)
ANION GAP: 12 (ref 5–15)
AST: 24 U/L (ref 15–41)
BILIRUBIN TOTAL: 0.5 mg/dL (ref 0.3–1.2)
BUN: 11 mg/dL (ref 6–20)
CO2: 22 mmol/L (ref 22–32)
Calcium: 9.1 mg/dL (ref 8.9–10.3)
Chloride: 110 mmol/L (ref 101–111)
Creatinine, Ser: 0.92 mg/dL (ref 0.61–1.24)
GFR calc Af Amer: >60 mL/min (ref >60)
GLUCOSE: 107 mg/dL — AB (ref 65–99)
Potassium: 3.5 mmol/L (ref 3.5–5.1)
Sodium: 144 mmol/L (ref 135–145)
TOTAL PROTEIN: 7.1 g/dL (ref 6.5–8.1)

## 2018-04-05 LAB — LIPASE, BLOOD: LIPASE: 61 U/L — AB (ref 11–51)

## 2018-04-05 LAB — RAPID URINE DRUG SCREEN, HOSP PERFORMED
Amphetamines: NOT DETECTED
BARBITURATES: NOT DETECTED
BENZODIAZEPINES: POSITIVE — AB
COCAINE: NOT DETECTED
OPIATES: NOT DETECTED
TETRAHYDROCANNABINOL: POSITIVE — AB

## 2018-04-05 MED ORDER — SUCRALFATE 1 G PO TABS: 1.0000 g | ORAL_TABLET | Freq: Three times a day (TID) | ORAL | 0 refills | Status: DC

## 2018-04-05 MED ORDER — ONDANSETRON 4 MG PO TBDP: 4.0000 mg | ORAL_TABLET | Freq: Three times a day (TID) | ORAL | 0 refills | Status: DC | PRN

## 2018-04-05 MED ORDER — GI COCKTAIL ~~LOC~~
30.0000 mL | Freq: Once | ORAL | Status: AC
  Administered 2018-04-05: 30 mL via ORAL
  Filled 2018-04-05: qty 30

## 2018-04-05 MED ORDER — PANTOPRAZOLE SODIUM 40 MG PO TBEC: 40.0000 mg | DELAYED_RELEASE_TABLET | Freq: Every day | ORAL | 0 refills | Status: DC

## 2018-04-05 MED ORDER — MORPHINE SULFATE (PF) 4 MG/ML IV SOLN
4.0000 mg | Freq: Once | INTRAVENOUS | Status: AC
  Administered 2018-04-05: 4 mg via INTRAVENOUS
  Filled 2018-04-05: qty 1

## 2018-04-05 MED ORDER — PROMETHAZINE HCL 25 MG/ML IJ SOLN
12.5000 mg | Freq: Four times a day (QID) | INTRAMUSCULAR | Status: DC | PRN
Start: 2018-04-05 — End: 2018-04-05
  Administered 2018-04-05: 12.5 mg via INTRAVENOUS
  Filled 2018-04-05: qty 1

## 2018-04-05 MED ORDER — SODIUM CHLORIDE 0.9 % IV BOLUS
1000.0000 mL | Freq: Once | INTRAVENOUS | Status: AC
  Administered 2018-04-05: 1000 mL via INTRAVENOUS

## 2018-04-05 MED ORDER — PANTOPRAZOLE SODIUM 40 MG PO TBEC
80.0000 mg | DELAYED_RELEASE_TABLET | Freq: Every day | ORAL | Status: DC
  Administered 2018-04-05: 80 mg via ORAL
  Filled 2018-04-05: qty 2

## 2018-04-05 NOTE — ED Notes (Signed)
Patient transported to CT 

## 2018-04-05 NOTE — Discharge Instructions (Addendum)
You must stop using NSAID medications including aspirin, Aleve, naproxen, ibuprofen.  Do not use caffeine or alcohol or marijuana.  Take the stomach medications as prescribed.  Follow-up with Dr. Karilyn Cotaehman to schedule an EGD.  Return to the ED if you develop worsening pain, vomiting or other concerns.

## 2018-04-05 NOTE — ED Notes (Signed)
Pt given PO Fluids. Pt tolerated fluids.

## 2018-04-05 NOTE — ED Notes (Signed)
Pt continues to be nauseated and vomiting after treatment.

## 2018-04-05 NOTE — ED Provider Notes (Signed)
[facility] EMERGENCY DEPARTMENT Provider Note   CSN: [id number] Arrival date & time: 04/04/18  2303     History   Chief Complaint Chief Complaint  Patient presents with  . Abdominal Pain    HPI Alan Jennings is a 37 y.o. male.  Patient reports he has had abdominal pain for many years.  He has been taking over-the-counter Excedrin multiple times a day.  He recently started himself on over-the-counter PPI.  He states his abdominal pain is been coming and going for several years but he has had multiple episodes of blood-streaked emesis for the past 2 days.  Yesterday he saw some bright red blood streaks in his emesis and today he said he threw up bright red blood x2.  His stools have been intermittently bloody but not black.  Denies any pain with defecation.  He has not been evaluated for his abdominal pain until today.  He describes the abdominal pain as a diffuse, burning that waxes and wanes in intensity.  No previous abdominal surgeries.  Denies any alcohol use denies any blood thinner use.  Does admit to using about 6-8 Excedrin tablets per day. He has been doing this since his shoulder surgery in 2012.   The history is provided by the patient.  Abdominal Pain   Associated symptoms include nausea and vomiting. Pertinent negatives include fever, hematuria, headaches, arthralgias and myalgias.    Past Medical History:  Diagnosis Date  . Anxiety   . Back pain   . Complication of anesthesia    had a syncopal episode after rhinoplasty  . Depression   . Headache   . Hypertension   . Kidney stone   . Seizures (HCC)   . Seizures [facility])     Patient Active Problem List   Diagnosis Date Noted  . Muscle weakness (generalized) 09/22/2012  . Neurogenic pain 08/03/2012  . Hypokalemia 06/03/2012  . Left shoulder pain 06/03/2012  . Wound infection after surgery 06/03/2012  . Status post shoulder surgery 05/11/2012  . Shoulder injury 11/13/2011  . Contusion of arm, left 10/08/2011    . Fracture of humerus, proximal, left, closed 08/27/2011  . Fracture, humerus 06/11/2011  . CLOSED FRACTURE UNSPEC PART UPPER END HUMERUS 02/27/2011    Past Surgical History:  Procedure Laterality Date  . NOSE SURGERY     [facility]  . ORIF HUMERUS FRACTURE  04/26/2012   Procedure: OPEN REDUCTION INTERNAL FIXATION (ORIF) PROXIMAL HUMERUS FRACTURE;  Surgeon: Vickki Hearing, MD;  Location: [facility];  Service: Orthopedics;  Laterality: Left;  Open Reduction Internal Fixation of Left Proximal Humerus Fracture, Closing Wedge Osteotomy, Bone Graft  . RHINOPLASTY     [facility]  . SHOULDER SURGERY          Home Medications    Prior to Admission medications   Medication Sig Start Date End Date Taking? Authorizing Provider  aspirin-acetaminophen-caffeine (EXCEDRIN EXTRA STRENGTH) [phone] MG tablet Take 1-2 tablets by mouth every 6 (six) hours as needed (for pain).    [provider]  Aspirin-Acetaminophen-Caffeine (GOODY HEADACHE PO) Take 2 packets by mouth daily as needed (for pain).     [provider]  diazepam (VALIUM) 5 MG tablet Take 1 tablet (5 mg total) by mouth at bedtime. 08/19/17   Burgess Amor, PA-C  PARoxetine (PAXIL) 10 MG tablet Take 1 tablet (10 mg total) by mouth daily. 08/19/17   Burgess Amor, PA-C    Family History Family History  Problem Relation Age of Onset  . Heart  disease Unknown   . Arthritis Unknown   . Anesthesia problems Neg Hx   . Hypotension Neg Hx   . Malignant hyperthermia Neg Hx   . Pseudochol deficiency Neg Hx     Social History Social History   Tobacco Use  . Smoking status: Current Every Day Smoker    Packs/day: 0.50    Years: 7.00    Pack years: 3.50    Types: E-cigarettes  . Smokeless tobacco: Never Used  Substance Use Topics  . Alcohol use: No  . Drug use: Yes    Types: Marijuana    Comment: occ     Allergies   Tramadol   Review of Systems Review of Systems  Constitutional: Negative for activity change, appetite change  and fever.  HENT: Negative for congestion and rhinorrhea.   Respiratory: Negative for cough, chest tightness and shortness of breath.   Cardiovascular: Negative for chest pain.  Gastrointestinal: Positive for abdominal pain, blood in stool, nausea and vomiting.  Genitourinary: Negative for hematuria and testicular pain.  Musculoskeletal: Negative for arthralgias, back pain and myalgias.  Skin: Negative for rash.  Neurological: Negative for dizziness, weakness, light-headedness and headaches.   all other systems are negative except as noted in the HPI and PMH.     Physical Exam Updated Vital Signs BP (!) 141/75   Pulse 98   Temp 98 F (36.7 C) (Oral)   Resp 20   Ht 5\' 8"  (1.727 m)   Wt 70.3 kg (155 lb)   SpO2 100%   BMI 23.57 kg/m   Physical Exam  Constitutional: He is oriented to person, place, and time. He appears well-developed and well-nourished. No distress.  HENT:  Head: Normocephalic and atraumatic.  Mouth/Throat: Oropharynx is clear and moist. No oropharyngeal exudate.  Eyes: Pupils are equal, round, and reactive to light. Conjunctivae and EOM are normal.  Neck: Normal range of motion. Neck supple.  No meningismus.  Cardiovascular: Normal rate, regular rhythm, normal heart sounds and intact distal pulses.  No murmur heard. Pulmonary/Chest: Effort normal and breath sounds normal. No respiratory distress.  Abdominal: Soft. There is tenderness. There is no rebound and no guarding.  Mild diffuse tenderness, voluntary guarding throughout, no rebound  Genitourinary:  Genitourinary Comments: Chaperone present, no hemorrhoids, no gross blood, no melena  Musculoskeletal: Normal range of motion. He exhibits no edema or tenderness.  Neurological: He is alert and oriented to person, place, and time. No cranial nerve deficit. He exhibits normal muscle tone. Coordination normal.  No ataxia on finger to nose bilaterally. No pronator drift. 5/5 strength throughout. CN 2-12  intact.Equal grip strength. Sensation intact.   Skin: Skin is warm.  Psychiatric: He has a normal mood and affect. His behavior is normal.  Nursing note and vitals reviewed.    ED Treatments / Results  Labs (all labs ordered are listed, but only abnormal results are displayed) Labs Reviewed  URINALYSIS, ROUTINE W REFLEX MICROSCOPIC - Abnormal; Notable for the following components:      Result Value   Hgb urine dipstick SMALL (*)    Squamous Epithelial / LPF 0-5 (*)    All other components within normal limits  COMPREHENSIVE METABOLIC PANEL - Abnormal; Notable for the following components:   Glucose, Bld 107 (*)    ALT 13 (*)    All other components within normal limits  LIPASE, BLOOD - Abnormal; Notable for the following components:   Lipase 61 (*)    All other components within normal limits  RAPID URINE DRUG SCREEN, HOSP PERFORMED - Abnormal; Notable for the following components:   Benzodiazepines POSITIVE (*)    Tetrahydrocannabinol POSITIVE (*)    All other components within normal limits  CBC WITH DIFFERENTIAL/PLATELET  POC OCCULT BLOOD, ED    EKG None  Radiology Ct Abdomen Pelvis W Contrast  Result Date: 04/05/2018 CLINICAL DATA:  Acute onset of hematemesis. Generalized abdominal pain. EXAM: CT ABDOMEN AND PELVIS WITH CONTRAST TECHNIQUE: Multidetector CT imaging of the abdomen and pelvis was performed using the standard protocol following bolus administration of intravenous contrast. CONTRAST:  ISOVUE-300 IOPAMIDOL (ISOVUE-300) INJECTION 61% COMPARISON:  CT of the abdomen and pelvis performed 01/27/2017 FINDINGS: Lower chest: The visualized lung bases are grossly clear. The visualized portions of the mediastinum are unremarkable. Hepatobiliary: The liver is unremarkable in appearance. The gallbladder is unremarkable in appearance. The common bile duct remains normal in caliber. Pancreas: The pancreas is within normal limits. Spleen: The spleen is unremarkable in  appearance. Adrenals/Urinary Tract: The adrenal glands are unremarkable in appearance. The kidneys are within normal limits. There is no evidence of hydronephrosis. No renal or ureteral stones are identified. No perinephric stranding is seen. Stomach/Bowel: There is suggestion of mild wall thickening about the fundus of the stomach, though this may simply reflect partial decompression. Given the patient's hematemesis, endoscopy could be considered for further evaluation, to exclude underlying mass. There is question of a few small adjacent nodes, measuring up to 7 mm. The small bowel is within normal limits. The appendix is normal in caliber, without evidence of appendicitis. The colon is unremarkable in appearance. Vascular/Lymphatic: The abdominal aorta is unremarkable in appearance. The inferior vena cava is grossly unremarkable. No retroperitoneal lymphadenopathy is seen. No pelvic sidewall lymphadenopathy is identified. Reproductive: The bladder is mildly distended and grossly unremarkable. The prostate is normal in size. Other: No additional soft tissue abnormalities are seen. Musculoskeletal: No acute osseous abnormalities are identified. The visualized musculature is unremarkable in appearance. IMPRESSION: Suggestion of mild wall thickening about the fundus of the stomach. Given the patient's hematemesis, endoscopy could be considered for further evaluation, to exclude underlying mass. Question of a few small adjacent nodes. Electronically Signed   By: Roanna Raider M.D.   On: 04/05/2018 00:55    Procedures Procedures (including critical care time)  Medications Ordered in ED Medications  sodium chloride 0.9 % bolus 1,000 mL (1,000 mLs Intravenous New Bag/Given 04/04/18 2347)  famotidine (PEPCID) IVPB 20 mg premix (20 mg Intravenous New Bag/Given 04/04/18 2347)  iopamidol (ISOVUE-300) 61 % injection 100 mL (has no administration in time range)  ondansetron (ZOFRAN) injection 4 mg (4 mg Intravenous  Given 04/04/18 2347)     Initial Impression / Assessment and Plan / ED Course  I have reviewed the triage vital signs and the nursing notes.  Pertinent labs & imaging results that were available during my care of the patient were reviewed by me and considered in my medical decision making (see chart for details).    Patient with diffuse abdominal pain for years now with episodes of hematemesis since yesterday.  He is a hemodynamically is stable. His abdomen is tender but benign.  Suspect likely gastritis and esophagitis due to NSAID use.  Hemoccult is negative.  IV fluids, PPI, Pepcid given.  Hemoccult is negative.  Hemoglobin is stable.  CT scan shows gastritis likely secondary to NSAID use. Consider also PUD and mallory weiss tear. Discussed with patient that he needs to the cease all NSAIDs including aspirin and  Excedrin.  Needs to start daily PPI and follow-up with gastroenterology for EGD.  He remains hemodynamically stable in the ED.  He did have one episode of blood-streaked emesis on arrival but has since been tolerating p.o. without difficulty.  Observation admission offered but patient declines.  He states he has an appointment with disability doctor tomorrow that he cannot miss.  Advised to stop NSAIDs completely as well as marijuana.  Start PPI, Carafate and follow-up with GI doctor.  Return precautions discussed.  BP 118/74   Pulse (!) 57   Temp 98 F (36.7 C) (Oral)   Resp 16   Ht 5\' 8"  (1.727 m)   Wt 70.3 kg (155 lb)   SpO2 100%   BMI 23.57 kg/m    Final Clinical Impressions(s) / ED Diagnoses   Final diagnoses:  Acute gastritis, presence of bleeding unspecified, unspecified gastritis type    ED Discharge Orders    None       Aryaman Haliburton, Jeannett Senior, MD 04/05/18 [phone]

## 2018-04-06 MED FILL — ZOLPIDEM TARTRATE 5 MG TAB: 5 | 30 days supply | Qty: 15 | Fill #0 | Status: TO

## 2018-04-06 MED FILL — SUBOXONE 8 MG-2 MG SL FILM: 8-2 | 28 days supply | Qty: 84 | Fill #0

## 2018-05-01 ENCOUNTER — Encounter (HOSPITAL_COMMUNITY): Payer: Self-pay

## 2018-05-01 ENCOUNTER — Emergency Department (HOSPITAL_COMMUNITY)
Admission: EM | Admit: 2018-05-01 | Discharge: 2018-05-01 | Disposition: A | Discharge status: Home / Self Care | Payer: Medicaid Other | Attending: Emergency Medicine | Admitting: Emergency Medicine

## 2018-05-01 DIAGNOSIS — I10 Essential (primary) hypertension: Secondary | ICD-10-CM | POA: Diagnosis not present

## 2018-05-01 DIAGNOSIS — R1013 Epigastric pain: Secondary | ICD-10-CM | POA: Diagnosis present

## 2018-05-01 DIAGNOSIS — Z79899 Other long term (current) drug therapy: Secondary | ICD-10-CM | POA: Insufficient documentation

## 2018-05-01 DIAGNOSIS — K2901 Acute gastritis with bleeding: Secondary | ICD-10-CM | POA: Insufficient documentation

## 2018-05-01 DIAGNOSIS — F17290 Nicotine dependence, other tobacco product, uncomplicated: Secondary | ICD-10-CM | POA: Insufficient documentation

## 2018-05-01 LAB — BASIC METABOLIC PANEL
Anion gap: 10 (ref 5–15)
BUN: 13 mg/dL (ref 6–20)
CHLORIDE: 106 mmol/L (ref 101–111)
CO2: 25 mmol/L (ref 22–32)
CREATININE: 0.92 mg/dL (ref 0.61–1.24)
Calcium: 9.6 mg/dL (ref 8.9–10.3)
GFR calc non Af Amer: >60 mL/min (ref >60)
Glucose, Bld: 102 mg/dL — ABNORMAL HIGH (ref 65–99)
POTASSIUM: 3.6 mmol/L (ref 3.5–5.1)
SODIUM: 141 mmol/L (ref 135–145)

## 2018-05-01 LAB — CBC WITH DIFFERENTIAL/PLATELET
Basophils Absolute: 0 10*3/uL (ref 0.0–0.1)
Basophils Relative: 0 %
EOS ABS: 0 10*3/uL (ref 0.0–0.7)
Eosinophils Relative: 0 %
HCT: 39.7 % (ref 39.0–52.0)
HEMOGLOBIN: 13.1 g/dL (ref 13.0–17.0)
LYMPHS ABS: 1.9 10*3/uL (ref 0.7–4.0)
LYMPHS PCT: 32 %
MCH: 30.4 pg (ref 26.0–34.0)
MCHC: 33 g/dL (ref 30.0–36.0)
MCV: 92.1 fL (ref 78.0–100.0)
MONO ABS: 0.4 10*3/uL (ref 0.1–1.0)
MONOS PCT: 7 %
NEUTROS ABS: 3.6 10*3/uL (ref 1.7–7.7)
NEUTROS PCT: 61 %
PLATELETS: 236 10*3/uL (ref 150–400)
RBC: 4.31 MIL/uL (ref 4.22–5.81)
RDW: 12.5 % (ref 11.5–15.5)
WBC: 6 10*3/uL (ref 4.0–10.5)

## 2018-05-01 LAB — TYPE AND SCREEN
ABO/RH(D): A POS
ANTIBODY SCREEN: NEGATIVE

## 2018-05-01 MED ORDER — PANTOPRAZOLE SODIUM 40 MG IV SOLR
40.0000 mg | Freq: Once | INTRAVENOUS | Status: AC
  Administered 2018-05-01: 40 mg via INTRAVENOUS
  Filled 2018-05-01: qty 40

## 2018-05-01 MED ORDER — PANTOPRAZOLE SODIUM 40 MG PO TBEC: 40.0000 mg | DELAYED_RELEASE_TABLET | Freq: Every day | ORAL | 0 refills | Status: DC

## 2018-05-01 MED ORDER — ONDANSETRON HCL 4 MG/2ML IJ SOLN
4.0000 mg | Freq: Once | INTRAMUSCULAR | Status: AC
  Administered 2018-05-01: 4 mg via INTRAVENOUS
  Filled 2018-05-01: qty 2

## 2018-05-01 MED ORDER — MORPHINE SULFATE (PF) 4 MG/ML IV SOLN
4.0000 mg | Freq: Once | INTRAVENOUS | Status: AC
  Administered 2018-05-01: 4 mg via INTRAVENOUS
  Filled 2018-05-01: qty 1

## 2018-05-01 MED ORDER — SODIUM CHLORIDE 0.9 % IV BOLUS
1000.0000 mL | Freq: Once | INTRAVENOUS | Status: AC
  Administered 2018-05-01: 1000 mL via INTRAVENOUS

## 2018-05-01 MED ORDER — ONDANSETRON HCL 8 MG PO TABS: 8.0000 mg | ORAL_TABLET | ORAL | 0 refills | Status: DC | PRN

## 2018-05-01 MED ORDER — OXYCODONE-ACETAMINOPHEN 5-325 MG PO TABS: 1.0000 | ORAL_TABLET | ORAL | 0 refills | Status: DC | PRN

## 2018-05-01 NOTE — ED Notes (Signed)
Pt reports increased abdominal pain. EDP notified will re order pain med

## 2018-05-01 NOTE — ED Notes (Signed)
Advised patient not to drive after discharge due to narcotic medication administration. Also advised patient not to drive while taking prescription pain medications. Patient verbalized understanding.  

## 2018-05-01 NOTE — ED Triage Notes (Signed)
Pt reports that he was seen in ED 04/04/18 for abdominal and was referred to Dr Karilyn Cota. He reports he lost his insurance and has been unable to see him . Pain started 2 days ago and has gotten worse over the night. Pt tearful and reports increase anxiety. Pt reports he last vomited at 4am and had red blood in it. Reports belching and burning in stomach

## 2018-05-01 NOTE — Discharge Instructions (Addendum)
Call the gastroenterology office Monday for follow-up.  No Excedrin, aspirin, ibuprofen, Advil, Aleve, etc.   Prescription for ulcer medicine, nausea medicine, pain medicine.  Avoid spicy greasy foods.

## 2018-05-01 NOTE — ED Provider Notes (Signed)
[facility] EMERGENCY DEPARTMENT Provider Note   CSN: [id number] Arrival date & time: 05/01/18  0759     History   Chief Complaint Chief Complaint  Patient presents with  . Abdominal Pain    HPI Alan Jennings is a 37 y.o. male.  Burning sensation in epigastrium for several weeks, worse the past 2 days.  Similar visit last month to the emergency department with a subsequent referral to gastroenterology.  He was unable to go secondary to insurance issues.  He vomited last night and reported some blood in his vomitus.  No black stool.  He has taken Excedrin recently.  Severity of symptoms is moderate.     Past Medical History:  Diagnosis Date  . Anxiety   . Back pain   . Complication of anesthesia    had a syncopal episode after rhinoplasty  . Depression   . Headache   . Hypertension   . Kidney stone   . Seizures (HCC)   . Seizures [facility])     Patient Active Problem List   Diagnosis Date Noted  . Muscle weakness (generalized) 09/22/2012  . Neurogenic pain 08/03/2012  . Hypokalemia 06/03/2012  . Left shoulder pain 06/03/2012  . Wound infection after surgery 06/03/2012  . Status post shoulder surgery 05/11/2012  . Shoulder injury 11/13/2011  . Contusion of arm, left 10/08/2011  . Fracture of humerus, proximal, left, closed 08/27/2011  . Fracture, humerus 06/11/2011  . CLOSED FRACTURE UNSPEC PART UPPER END HUMERUS 02/27/2011    Past Surgical History:  Procedure Laterality Date  . NOSE SURGERY     [facility]  . ORIF HUMERUS FRACTURE  04/26/2012   Procedure: OPEN REDUCTION INTERNAL FIXATION (ORIF) PROXIMAL HUMERUS FRACTURE;  Surgeon: Vickki Hearing, MD;  Location: [facility];  Service: Orthopedics;  Laterality: Left;  Open Reduction Internal Fixation of Left Proximal Humerus Fracture, Closing Wedge Osteotomy, Bone Graft  . RHINOPLASTY     [facility]  . SHOULDER SURGERY          Home Medications    Prior to Admission medications   Medication Sig Start Date End Date  Taking? Authorizing Provider  aspirin-acetaminophen-caffeine (EXCEDRIN EXTRA STRENGTH) [phone] MG tablet Take 1-2 tablets by mouth every 6 (six) hours as needed (for pain).   Yes [provider]  diazepam (VALIUM) 5 MG tablet Take 1 tablet (5 mg total) by mouth at bedtime. 08/19/17  Yes Idol, Raynelle Fanning, PA-C  MOVANTIK 25 MG TABS tablet Take 1 tablet by mouth daily as needed. 04/07/18  Yes [provider]  sucralfate (CARAFATE) 1 g tablet Take 1 tablet (1 g total) by mouth 4 (four) times daily -  with meals and at bedtime. 04/05/18  Yes Rancour, Jeannett Senior, MD  zolpidem (AMBIEN) 5 MG tablet Take 1 tablet by mouth at bedtime as needed. 04/27/18  Yes [provider]  ondansetron (ZOFRAN) 8 MG tablet Take 1 tablet (8 mg total) by mouth every 4 (four) hours as needed. 05/01/18   Donnetta Hutching, MD  oxyCODONE-acetaminophen (PERCOCET/ROXICET) 5-325 MG tablet Take 1-2 tablets by mouth every 4 (four) hours as needed for severe pain. 05/01/18   Donnetta Hutching, MD  pantoprazole (PROTONIX) 40 MG tablet Take 1 tablet (40 mg total) by mouth daily. 05/01/18   Donnetta Hutching, MD  PARoxetine (PAXIL) 10 MG tablet Take 1 tablet (10 mg total) by mouth daily. Patient not taking: Reported on 05/01/2018 08/19/17   Burgess Amor, PA-C    Family History Family History  Problem Relation Age of  Onset  . Heart disease Unknown   . Arthritis Unknown   . Anesthesia problems Neg Hx   . Hypotension Neg Hx   . Malignant hyperthermia Neg Hx   . Pseudochol deficiency Neg Hx     Social History Social History   Tobacco Use  . Smoking status: Current Every Day Smoker    Packs/day: 0.50    Years: 7.00    Pack years: 3.50    Types: E-cigarettes  . Smokeless tobacco: Never Used  Substance Use Topics  . Alcohol use: No  . Drug use: Yes    Types: Marijuana    Comment: occ     Allergies   Tramadol   Review of Systems Review of Systems  All other systems reviewed and are negative.    Physical Exam Updated  Vital Signs BP 136/88   Pulse 75   Temp 98.2 F (36.8 C) (Oral)   Resp 16   Ht  (1.727 m)   Wt 70.8 kg (156 lb)   SpO2 100%   BMI 23.72 kg/m   Physical Exam  Constitutional: He is oriented to person, place, and time. He appears well-developed and well-nourished.  HENT:  Head: Normocephalic and atraumatic.  Eyes: Conjunctivae are normal.  Neck: Neck Jennings.  Cardiovascular: Normal rate and regular rhythm.  Pulmonary/Chest: Effort normal and breath sounds normal.  Abdominal: Soft. Bowel sounds are normal.  Minimal epigastric tenderness.  Musculoskeletal: Normal range of motion.  Neurological: He is alert and oriented to person, place, and time.  Skin: Skin is warm and dry.  Psychiatric: He has a normal mood and affect. His behavior is normal.  Nursing note and vitals reviewed.    ED Treatments / Results  Labs (all labs ordered are listed, but only abnormal results are displayed) Labs Reviewed  BASIC METABOLIC PANEL - Abnormal; Notable for the following components:      Result Value   Glucose, Bld 102 (*)    All other components within normal limits  CBC WITH DIFFERENTIAL/PLATELET  TYPE AND SCREEN    EKG None  Radiology No results found.  Procedures Procedures (including critical care time)  Medications Ordered in ED Medications  sodium chloride 0.9 % bolus 1,000 mL (0 mLs Intravenous Stopped 05/01/18 1116)  ondansetron (ZOFRAN) injection 4 mg (4 mg Intravenous Given 05/01/18 0932)  morphine 4 MG/ML injection 4 mg (4 mg Intravenous Given 05/01/18 0932)  pantoprazole (PROTONIX) injection 40 mg (40 mg Intravenous Given 05/01/18 0931)  morphine 4 MG/ML injection 4 mg (4 mg Intravenous Given 05/01/18 1114)  morphine 4 MG/ML injection 4 mg (4 mg Intravenous Given 05/01/18 1231)     Initial Impression / Assessment and Plan / ED Course  I have reviewed the triage vital signs and the nursing notes.  Pertinent labs & imaging results that were available during my  care of the patient were reviewed by me and considered in my medical decision making (see chart for details).     Patient presents with epigastric pain and vomiting with some blood.  His blood were stable.  He responded well to IV fluids, IV Zofran, IV morphine, IV Protonix.  Discharge medications Protonix 40 mg, Zofran 8 mg, Percocet.  He will follow-up with gastroenterology on Monday.  Final Clinical Impressions(s) / ED Diagnoses   Final diagnoses:  Acute gastritis with hemorrhage, unspecified gastritis type    ED Discharge Orders        Ordered    pantoprazole (PROTONIX) 40 MG tablet  Daily  05/01/18 1309    ondansetron (ZOFRAN) 8 MG tablet  Every 4 hours PRN     05/01/18 1309    oxyCODONE-acetaminophen (PERCOCET/ROXICET) 5-325 MG tablet  Every 4 hours PRN     05/01/18 1309       Donnetta Hutching, MD 05/01/18 1335

## 2018-05-04 MED FILL — SUBOXONE 8 MG-2 MG SL FILM: 8-2 | 28 days supply | Qty: 84 | Fill #0

## 2018-05-05 ENCOUNTER — Ambulatory Visit (INDEPENDENT_AMBULATORY_CARE_PROVIDER_SITE_OTHER): Payer: Medicaid Other | Admitting: Internal Medicine

## 2018-05-12 ENCOUNTER — Ambulatory Visit (INDEPENDENT_AMBULATORY_CARE_PROVIDER_SITE_OTHER): Payer: Medicaid Other | Admitting: Internal Medicine

## 2018-05-12 ENCOUNTER — Encounter (INDEPENDENT_AMBULATORY_CARE_PROVIDER_SITE_OTHER): Payer: Self-pay | Admitting: Internal Medicine

## 2018-05-12 VITALS — BP 126/82 | HR 76 | Temp 98.1°F | Ht 68.0 in | Wt 158.6 lb

## 2018-05-12 DIAGNOSIS — K920 Hematemesis: Secondary | ICD-10-CM

## 2018-05-12 DIAGNOSIS — R1013 Epigastric pain: Secondary | ICD-10-CM

## 2018-05-12 MED ORDER — PANTOPRAZOLE SODIUM 40 MG PO TBEC: 40.0000 mg | DELAYED_RELEASE_TABLET | Freq: Every day | ORAL | 3 refills | Status: DC

## 2018-05-12 MED FILL — ZOLPIDEM TARTRATE 5 MG TAB: 5 | 30 days supply | Qty: 30 | Fill #0

## 2018-05-12 NOTE — Progress Notes (Addendum)
Subjective:    Patient ID: Alan Jennings, male    DOB: 01-02-1981, 37 y.o.   MRN: [id number]  HPI Referred by the ED for epigastric pain (05/01/2018). Similar visit 04/04/2018. Epigastric pain for several weeks. Referred to GI but was unable to go due to insurance issues. He says he is vomiting bright red vomitus. Has been throwing up blood for a month. Does not occur every day. He c/o epigastric pain. He says it feels like a torch in his stomach. He says his abdomen does not hurt as bad if he does not eat. He says when he has the pain, it is terrible.   . Takes Excedrin Extra  Strength.. Takes Excedrin six tabs a day.  Takes Suboxone as needed for left arm pain (old injury).  Appetite is not good. He can only eat 2-3 bites. No melena or BRRB. When he burps, it is acidity tasting.   CT scan on 04/05/2018 for hematemesis, generalized abdominal pain revealed:   IMPRESSION: Suggestion of mild wall thickening about the fundus of the stomach. Given the patient's hematemesis, endoscopy could be considered for further evaluation, to exclude underlying mass. Question of a few small adjacent nodes.  CBC    Component Value Date/Time   WBC 6.0 05/01/2018 0944   RBC 4.31 05/01/2018 0944   HGB 13.1 05/01/2018 0944   HCT 39.7 05/01/2018 0944   PLT 236 05/01/2018 0944   MCV 92.1 05/01/2018 0944   MCH 30.4 05/01/2018 0944   MCHC 33.0 05/01/2018 0944   RDW 12.5 05/01/2018 0944   LYMPHSABS 1.9 05/01/2018 0944   MONOABS 0.4 05/01/2018 0944   EOSABS 0.0 05/01/2018 0944   BASOSABS 0.0 05/01/2018 0944     Review of Systems Past Medical History:  Diagnosis Date  . Anxiety   . Back pain   . Complication of anesthesia    had a syncopal episode after rhinoplasty  . Depression   . Headache   . Hypertension   . Kidney stone   . Seizures (HCC)   . Seizures (HCC)     Past Surgical History:  Procedure Laterality Date  . NOSE SURGERY     [facility]  . ORIF HUMERUS FRACTURE  04/26/2012   Procedure:  OPEN REDUCTION INTERNAL FIXATION (ORIF) PROXIMAL HUMERUS FRACTURE;  Surgeon: Vickki Hearing, MD;  Location: [facility];  Service: Orthopedics;  Laterality: Left;  Open Reduction Internal Fixation of Left Proximal Humerus Fracture, Closing Wedge Osteotomy, Bone Graft  . RHINOPLASTY     [facility]  . SHOULDER SURGERY      Allergies  Allergen Reactions  . Tramadol Other (See Comments)    SEIZURES    Current Outpatient Medications on File Prior to Visit  Medication Sig Dispense Refill  . aspirin-acetaminophen-caffeine (EXCEDRIN EXTRA STRENGTH) 250-250-65 MG tablet Take 1-2 tablets by mouth every 6 (six) hours as needed (for pain).    . buprenorphine-naloxone (SUBOXONE) 8-2 mg SUBL SL tablet Place 1 tablet under the tongue as needed.    Marland Kitchen MOVANTIK 25 MG TABS tablet Take 1 tablet by mouth daily as needed.  5  . pantoprazole (PROTONIX) 40 MG tablet Take 1 tablet (40 mg total) by mouth daily. 30 tablet 0  . zolpidem (AMBIEN) 5 MG tablet Take 1 tablet by mouth at bedtime as needed.  0   No current facility-administered medications on file prior to visit.         Objective:   Physical Exam Blood pressure 126/82, pulse 76, temperature 98.1 F (  36.7 C), height  (1.727 m), weight 158 lb 9.6 oz (71.9 kg).  Alert and oriented. Skin warm and dry. Oral mucosa is moist.   . Sclera anicteric, conjunctivae is pink. Thyroid not enlarged. No cervical lymphadenopathy. Lungs clear. Heart regular rate and rhythm.  Abdomen is soft. Bowel sounds are positive. No hepatomegaly. No abdominal masses felt.Epigastric tenderness.  No edema to lower extremities.          Assessment & Plan:  Epigastric pain. He will stop the Excedrin.  Increase Protonix  BID. EGD with propofol. PUD needs to be ruled out. Also had an abnormal CT which showed suggestion of mild wall thickening about the fundus of the stomach.

## 2018-05-12 NOTE — Patient Instructions (Addendum)
Rx for Protonix sent to his pharmacy. The risks of bleeding, perforation and infection were reviewed with patient. Stop the Excedrin.

## 2018-05-13 ENCOUNTER — Other Ambulatory Visit (INDEPENDENT_AMBULATORY_CARE_PROVIDER_SITE_OTHER): Payer: Self-pay | Admitting: Internal Medicine

## 2018-05-13 ENCOUNTER — Encounter (INDEPENDENT_AMBULATORY_CARE_PROVIDER_SITE_OTHER): Payer: Self-pay | Admitting: *Deleted

## 2018-05-13 DIAGNOSIS — G8929 Other chronic pain: Secondary | ICD-10-CM | POA: Insufficient documentation

## 2018-05-13 DIAGNOSIS — K219 Gastro-esophageal reflux disease without esophagitis: Secondary | ICD-10-CM | POA: Insufficient documentation

## 2018-05-13 DIAGNOSIS — K920 Hematemesis: Secondary | ICD-10-CM

## 2018-05-13 DIAGNOSIS — R1013 Epigastric pain: Secondary | ICD-10-CM | POA: Insufficient documentation

## 2018-05-14 ENCOUNTER — Other Ambulatory Visit (INDEPENDENT_AMBULATORY_CARE_PROVIDER_SITE_OTHER): Payer: Self-pay | Admitting: *Deleted

## 2018-05-14 ENCOUNTER — Telehealth (INDEPENDENT_AMBULATORY_CARE_PROVIDER_SITE_OTHER): Payer: Self-pay | Admitting: Internal Medicine

## 2018-05-14 DIAGNOSIS — K920 Hematemesis: Secondary | ICD-10-CM

## 2018-05-14 NOTE — Progress Notes (Signed)
DISCUSSED WITH TERRI. PT REPORTS HEMATEMESIS FOR ONE MO BUT Hb MAY 18: NL(13.1). NO GI CALL THIS WEEKEND. PT SHOULD: 1. STOP ASA. 2. CHECK CBC TODAY.  3. IF PT IS THROWING UP BLOOD THIS WEEKEND HE SHOULD GO TO ED AT [facility]. NO GI COVERAGE THIS WEEKEND.

## 2018-05-14 NOTE — Telephone Encounter (Signed)
Talked with Dr. Darrick Penna today. If patient has hematemesis again, he is to go to [facility]. No GI coverage this weekend. Will also get a CBC on him. He will go to hospital tomorrow and have lab drawn.

## 2018-05-17 ENCOUNTER — Telehealth (INDEPENDENT_AMBULATORY_CARE_PROVIDER_SITE_OTHER): Payer: Self-pay | Admitting: Internal Medicine

## 2018-05-17 ENCOUNTER — Telehealth (INDEPENDENT_AMBULATORY_CARE_PROVIDER_SITE_OTHER): Payer: Self-pay | Admitting: *Deleted

## 2018-05-17 NOTE — Telephone Encounter (Signed)
noted 

## 2018-05-17 NOTE — Telephone Encounter (Signed)
Patient left message stating he needs to cancel EGD

## 2018-05-17 NOTE — Telephone Encounter (Signed)
patient left message to cancel appt for EGD 06/01/18, said he has appt at [facility] that morning that he can't miss, I explained that right now that was all the hospital had available and I would give him call when we get a cancellation

## 2018-05-19 ENCOUNTER — Encounter (INDEPENDENT_AMBULATORY_CARE_PROVIDER_SITE_OTHER): Payer: Self-pay | Admitting: *Deleted

## 2018-05-26 ENCOUNTER — Other Ambulatory Visit (HOSPITAL_COMMUNITY): Payer: Medicaid Other

## 2018-05-27 ENCOUNTER — Encounter (HOSPITAL_COMMUNITY): Payer: Self-pay

## 2018-05-27 ENCOUNTER — Emergency Department (HOSPITAL_COMMUNITY)
Admission: EM | Admit: 2018-05-27 | Discharge: 2018-05-27 | Disposition: A | Discharge status: Home / Self Care | Payer: Medicaid Other | Attending: Emergency Medicine | Admitting: Emergency Medicine

## 2018-05-27 ENCOUNTER — Other Ambulatory Visit: Payer: Self-pay

## 2018-05-27 DIAGNOSIS — Z79899 Other long term (current) drug therapy: Secondary | ICD-10-CM | POA: Insufficient documentation

## 2018-05-27 DIAGNOSIS — F17290 Nicotine dependence, other tobacco product, uncomplicated: Secondary | ICD-10-CM | POA: Insufficient documentation

## 2018-05-27 DIAGNOSIS — R1013 Epigastric pain: Secondary | ICD-10-CM | POA: Diagnosis not present

## 2018-05-27 DIAGNOSIS — F329 Major depressive disorder, single episode, unspecified: Secondary | ICD-10-CM | POA: Insufficient documentation

## 2018-05-27 DIAGNOSIS — I10 Essential (primary) hypertension: Secondary | ICD-10-CM | POA: Diagnosis not present

## 2018-05-27 DIAGNOSIS — F419 Anxiety disorder, unspecified: Secondary | ICD-10-CM | POA: Insufficient documentation

## 2018-05-27 DIAGNOSIS — R1010 Upper abdominal pain, unspecified: Secondary | ICD-10-CM | POA: Diagnosis present

## 2018-05-27 LAB — COMPREHENSIVE METABOLIC PANEL
ALT: 17 U/L (ref 17–63)
AST: 21 U/L (ref 15–41)
Albumin: 5.3 g/dL — ABNORMAL HIGH (ref 3.5–5.0)
Alkaline Phosphatase: 83 U/L (ref 38–126)
Anion gap: 15 (ref 5–15)
BUN: 8 mg/dL (ref 6–20)
CO2: 24 mmol/L (ref 22–32)
Calcium: 10.1 mg/dL (ref 8.9–10.3)
Chloride: 98 mmol/L — ABNORMAL LOW (ref 101–111)
Creatinine, Ser: 0.97 mg/dL (ref 0.61–1.24)
GFR calc Af Amer: >60 mL/min (ref >60)
GFR calc non Af Amer: >60 mL/min (ref >60)
Glucose, Bld: 92 mg/dL (ref 65–99)
Potassium: 3.2 mmol/L — ABNORMAL LOW (ref 3.5–5.1)
Sodium: 137 mmol/L (ref 135–145)
Total Bilirubin: 0.9 mg/dL (ref 0.3–1.2)
Total Protein: 9.1 g/dL — ABNORMAL HIGH (ref 6.5–8.1)

## 2018-05-27 LAB — CBC
HCT: 47 % (ref 39.0–52.0)
Hemoglobin: 15.8 g/dL (ref 13.0–17.0)
MCH: 30.8 pg (ref 26.0–34.0)
MCHC: 33.6 g/dL (ref 30.0–36.0)
MCV: 91.6 fL (ref 78.0–100.0)
Platelets: 294 10*3/uL (ref 150–400)
RBC: 5.13 MIL/uL (ref 4.22–5.81)
RDW: 12.6 % (ref 11.5–15.5)
WBC: 8.6 10*3/uL (ref 4.0–10.5)

## 2018-05-27 LAB — URINALYSIS, ROUTINE W REFLEX MICROSCOPIC
Bilirubin Urine: NEGATIVE
Glucose, UA: NEGATIVE mg/dL
Hgb urine dipstick: NEGATIVE
Ketones, ur: 5 mg/dL — AB
Leukocytes, UA: NEGATIVE
Nitrite: NEGATIVE
Protein, ur: NEGATIVE mg/dL
Specific Gravity, Urine: 1.001 — ABNORMAL LOW (ref 1.005–1.030)
pH: 6 (ref 5.0–8.0)

## 2018-05-27 LAB — LIPASE, BLOOD: Lipase: 45 U/L (ref 11–51)

## 2018-05-27 MED ORDER — FAMOTIDINE IN NACL 20-0.9 MG/50ML-% IV SOLN
20.0000 mg | Freq: Once | INTRAVENOUS | Status: AC
  Administered 2018-05-27: 20 mg via INTRAVENOUS
  Filled 2018-05-27: qty 50

## 2018-05-27 MED ORDER — HYDROMORPHONE HCL 1 MG/ML IJ SOLN
1.0000 mg | Freq: Once | INTRAMUSCULAR | Status: AC
  Administered 2018-05-27: 1 mg via INTRAVENOUS
  Filled 2018-05-27: qty 1

## 2018-05-27 MED ORDER — GI COCKTAIL ~~LOC~~
30.0000 mL | Freq: Once | ORAL | Status: AC
  Administered 2018-05-27: 30 mL via ORAL
  Filled 2018-05-27: qty 30

## 2018-05-27 MED ORDER — SODIUM CHLORIDE 0.9 % IV BOLUS
1000.0000 mL | Freq: Once | INTRAVENOUS | Status: AC
  Administered 2018-05-27: 1000 mL via INTRAVENOUS

## 2018-05-27 MED ORDER — MORPHINE SULFATE (PF) 4 MG/ML IV SOLN
4.0000 mg | Freq: Once | INTRAVENOUS | Status: AC
  Administered 2018-05-27: 4 mg via INTRAVENOUS
  Filled 2018-05-27: qty 1

## 2018-05-27 MED ORDER — SUCRALFATE 1 G PO TABS: 1.0000 g | ORAL_TABLET | Freq: Three times a day (TID) | ORAL | 0 refills | Status: DC

## 2018-05-27 MED ORDER — ONDANSETRON HCL 4 MG/2ML IJ SOLN
INTRAMUSCULAR | Status: AC
  Filled 2018-05-27: qty 2

## 2018-05-27 MED ORDER — ONDANSETRON HCL 4 MG/2ML IJ SOLN
4.0000 mg | Freq: Once | INTRAMUSCULAR | Status: AC
  Administered 2018-05-27: 4 mg via INTRAVENOUS

## 2018-05-27 NOTE — ED Provider Notes (Signed)
[facility] EMERGENCY DEPARTMENT Provider Note   CSN: [id number] Arrival date & time: 05/27/18  4098     History   Chief Complaint Chief Complaint  Patient presents with  . Abdominal Pain    HPI GIULIANO PREECE is a 37 y.o. male.  HPI   37 year old male with abdominal pain.  Ongoing issue.  Patient has been seen multiple times in the emergency room for upper abdominal/epigastric pain.  He is also been evaluated by gastroenterology and has a scheduled EGD next month.  Likely gastritis or PUD.  He was taking NSAIDs heavily and on a daily basis up until just recently.  He has had intermittent upper abdominal pain and nausea/vomiting.  Sometimes coffee grounds and sometimes with gross blood.  He has been on pantoprazole.  He states that his pain has been improving over the past couple weeks up until Tuesday but began worsening again.  Associate with anorexia.  No blood in stool or melena.  Denies any significant alcohol use.  He is completely abstaining from NSAIDs now.  Stopped smoking in December.  Past Medical History:  Diagnosis Date  . Anxiety   . Back pain   . Complication of anesthesia    had a syncopal episode after rhinoplasty  . Depression   . Headache   . Hypertension   . Kidney stone   . Seizures (HCC)   . Seizures [facility])     Patient Active Problem List   Diagnosis Date Noted  . Hematemesis without nausea 05/13/2018  . Abdominal pain, chronic, epigastric 05/13/2018  . Gastroesophageal reflux disease without esophagitis 05/13/2018  . Abdominal pain, epigastric 05/13/2018  . Muscle weakness (generalized) 09/22/2012  . Neurogenic pain 08/03/2012  . Hypokalemia 06/03/2012  . Left shoulder pain 06/03/2012  . Wound infection after surgery 06/03/2012  . Status post shoulder surgery 05/11/2012  . Shoulder injury 11/13/2011  . Contusion of arm, left 10/08/2011  . Fracture of humerus, proximal, left, closed 08/27/2011  . Fracture, humerus 06/11/2011  . CLOSED FRACTURE  UNSPEC PART UPPER END HUMERUS 02/27/2011    Past Surgical History:  Procedure Laterality Date  . NOSE SURGERY     [facility]  . ORIF HUMERUS FRACTURE  04/26/2012   Procedure: OPEN REDUCTION INTERNAL FIXATION (ORIF) PROXIMAL HUMERUS FRACTURE;  Surgeon: Vickki Hearing, MD;  Location: [facility];  Service: Orthopedics;  Laterality: Left;  Open Reduction Internal Fixation of Left Proximal Humerus Fracture, Closing Wedge Osteotomy, Bone Graft  . RHINOPLASTY     [facility]  . SHOULDER SURGERY          Home Medications    Prior to Admission medications   Medication Sig Start Date End Date Taking? Authorizing Provider  aspirin-acetaminophen-caffeine (EXCEDRIN EXTRA STRENGTH) [phone] MG tablet Take 1-2 tablets by mouth every 6 (six) hours as needed (for pain).    [provider]  buprenorphine-naloxone (SUBOXONE) 8-2 mg SUBL SL tablet Place 1 tablet under the tongue as needed.    [provider]  MOVANTIK 25 MG TABS tablet Take 1 tablet by mouth daily as needed. 04/07/18   [provider]  pantoprazole (PROTONIX) 40 MG tablet Take 1 tablet (40 mg total) by mouth daily. One tab 30 minutes before breakfast and one tab 30 minutes before supper. 05/12/18   Setzer, Brand Males, NP  zolpidem (AMBIEN) 5 MG tablet Take 1 tablet by mouth at bedtime as needed. 04/27/18   [provider]    Family History Family History  Problem Relation Age of  Onset  . Heart disease Unknown   . Arthritis Unknown   . Anesthesia problems Neg Hx   . Hypotension Neg Hx   . Malignant hyperthermia Neg Hx   . Pseudochol deficiency Neg Hx     Social History Social History   Tobacco Use  . Smoking status: Current Every Day Smoker    Packs/day: 0.50    Years: 7.00    Pack years: 3.50    Types: E-cigarettes  . Smokeless tobacco: Never Used  Substance Use Topics  . Alcohol use: No  . Drug use: Yes    Types: Marijuana    Comment: occ     Allergies   Tramadol   Review of Systems Review  of Systems  All systems reviewed and negative, other than as noted in HPI.  Physical Exam Updated Vital Signs BP (!) 149/91 (BP Location: Left Arm)   Pulse 93   Temp 98.6 F (37 C) (Oral)   Resp 20   Ht 5\' 8"  (1.727 m)   Wt 70.8 kg (156 lb)   SpO2 100%   BMI 23.72 kg/m   Physical Exam  Constitutional: He appears well-developed and well-nourished. No distress.  HENT:  Head: Normocephalic and atraumatic.  Eyes: Conjunctivae are normal. Right eye exhibits no discharge. Left eye exhibits no discharge.  Neck: Neck supple.  Cardiovascular: Normal rate, regular rhythm and normal heart sounds. Exam reveals no gallop and no friction rub.  No murmur heard. Pulmonary/Chest: Effort normal and breath sounds normal. No respiratory distress.  Abdominal: Soft. He exhibits no distension.  Epigastric tenderness without rebound or guarding.  No distention.  Musculoskeletal: He exhibits no edema or tenderness.  Neurological: He is alert.  Skin: Skin is warm and dry.  Psychiatric: He has a normal mood and affect. His behavior is normal. Thought content normal.  Nursing note and vitals reviewed.    ED Treatments / Results  Labs (all labs ordered are listed, but only abnormal results are displayed) Labs Reviewed  COMPREHENSIVE METABOLIC PANEL - Abnormal; Notable for the following components:      Result Value   Potassium 3.2 (*)    Chloride 98 (*)    Total Protein 9.1 (*)    Albumin 5.3 (*)    All other components within normal limits  URINALYSIS, ROUTINE W REFLEX MICROSCOPIC - Abnormal; Notable for the following components:   Color, Urine COLORLESS (*)    Specific Gravity, Urine 1.001 (*)    Ketones, ur 5 (*)    All other components within normal limits  LIPASE, BLOOD  CBC    EKG None  Radiology No results found.  Procedures Procedures (including critical care time)  Medications Ordered in ED Medications  ondansetron (ZOFRAN) injection 4 mg (4 mg Intravenous Given 05/27/18  4098)     Initial Impression / Assessment and Plan / ED Course  I have reviewed the triage vital signs and the nursing notes.  Pertinent labs & imaging results that were available during my care of the patient were reviewed by me and considered in my medical decision making (see chart for details).     37 year old male with likely peptic ulcer.  Stranding noted around the stomach on recent imaging.  He does report intermittent hematemesis.  His H&H is normal/stable though.  BUN is normal arguing against significant GI bleed.  He is HD stable. Will continue to treat his symptoms. Will add carafate.   Final Clinical Impressions(s) / ED Diagnoses   Final diagnoses:  Epigastric  pain    ED Discharge Orders    None       Raeford RazorKohut, Zaidy Absher, MD 05/30/18 1402

## 2018-05-27 NOTE — ED Triage Notes (Signed)
Pt states he has severe acid reflux, supposed to have an endo procedure in about 3 weeks, states his abd pain has worsened over the past 2 days.

## 2018-05-27 NOTE — ED Notes (Signed)
ED Provider at bedside. 

## 2018-06-01 ENCOUNTER — Encounter (HOSPITAL_COMMUNITY): Payer: Self-pay

## 2018-06-01 ENCOUNTER — Ambulatory Visit (HOSPITAL_COMMUNITY): Admit: 2018-06-01 | Payer: Medicaid Other | Admitting: Internal Medicine

## 2018-06-01 SURGERY — ESOPHAGOGASTRODUODENOSCOPY (EGD) WITH PROPOFOL
Anesthesia: Monitor Anesthesia Care

## 2018-06-01 MED FILL — SUBOXONE 8 MG-2 MG SL FILM: 8-2 | 28 days supply | Qty: 84 | Fill #0

## 2018-06-21 ENCOUNTER — Encounter (HOSPITAL_COMMUNITY): Payer: Self-pay

## 2018-06-21 ENCOUNTER — Encounter (HOSPITAL_COMMUNITY)
Admission: RE | Admit: 2018-06-21 | Discharge: 2018-06-21 | Disposition: A | Discharge status: Home / Self Care | Payer: Medicaid Other | Source: Ambulatory Visit | Attending: Internal Medicine | Admitting: Internal Medicine

## 2018-06-21 HISTORY — DX: Unspecified osteoarthritis, unspecified site: M19.90

## 2018-06-21 HISTORY — DX: Gastro-esophageal reflux disease without esophagitis: K21.9

## 2018-06-21 HISTORY — DX: Personal history of urinary calculi: Z87.442

## 2018-06-24 ENCOUNTER — Emergency Department (HOSPITAL_COMMUNITY)
Admission: EM | Admit: 2018-06-24 | Discharge: 2018-06-24 | Disposition: A | Discharge status: Home / Self Care | Payer: Medicaid Other | Attending: Emergency Medicine | Admitting: Emergency Medicine

## 2018-06-24 ENCOUNTER — Encounter (HOSPITAL_COMMUNITY): Payer: Self-pay | Admitting: *Deleted

## 2018-06-24 ENCOUNTER — Other Ambulatory Visit: Payer: Self-pay

## 2018-06-24 DIAGNOSIS — Z79899 Other long term (current) drug therapy: Secondary | ICD-10-CM | POA: Insufficient documentation

## 2018-06-24 DIAGNOSIS — R1013 Epigastric pain: Secondary | ICD-10-CM

## 2018-06-24 DIAGNOSIS — I10 Essential (primary) hypertension: Secondary | ICD-10-CM | POA: Diagnosis not present

## 2018-06-24 DIAGNOSIS — F17290 Nicotine dependence, other tobacco product, uncomplicated: Secondary | ICD-10-CM | POA: Diagnosis not present

## 2018-06-24 DIAGNOSIS — K920 Hematemesis: Secondary | ICD-10-CM | POA: Diagnosis present

## 2018-06-24 LAB — CBC WITH DIFFERENTIAL/PLATELET
Basophils Absolute: 0 10*3/uL (ref 0.0–0.1)
Basophils Relative: 0 %
Eosinophils Absolute: 0 10*3/uL (ref 0.0–0.7)
Eosinophils Relative: 0 %
HCT: 43 % (ref 39.0–52.0)
Hemoglobin: 14.6 g/dL (ref 13.0–17.0)
Lymphocytes Relative: 23 %
Lymphs Abs: 1.7 10*3/uL (ref 0.7–4.0)
MCH: 30.8 pg (ref 26.0–34.0)
MCHC: 34 g/dL (ref 30.0–36.0)
MCV: 90.7 fL (ref 78.0–100.0)
Monocytes Absolute: 0.3 10*3/uL (ref 0.1–1.0)
Monocytes Relative: 4 %
Neutro Abs: 5.3 10*3/uL (ref 1.7–7.7)
Neutrophils Relative %: 73 %
Platelets: 241 10*3/uL (ref 150–400)
RBC: 4.74 MIL/uL (ref 4.22–5.81)
RDW: 12.2 % (ref 11.5–15.5)
WBC: 7.2 10*3/uL (ref 4.0–10.5)

## 2018-06-24 LAB — COMPREHENSIVE METABOLIC PANEL
ALT: 15 U/L (ref 0–44)
AST: 20 U/L (ref 15–41)
Albumin: 4.8 g/dL (ref 3.5–5.0)
Alkaline Phosphatase: 71 U/L (ref 38–126)
Anion gap: 13 (ref 5–15)
BUN: 14 mg/dL (ref 6–20)
CO2: 21 mmol/L — ABNORMAL LOW (ref 22–32)
Calcium: 9.4 mg/dL (ref 8.9–10.3)
Chloride: 101 mmol/L (ref 98–111)
Creatinine, Ser: 0.97 mg/dL (ref 0.61–1.24)
GFR calc Af Amer: >60 mL/min (ref >60)
GFR calc non Af Amer: >60 mL/min (ref >60)
Glucose, Bld: 93 mg/dL (ref 70–99)
Potassium: 3.7 mmol/L (ref 3.5–5.1)
Sodium: 135 mmol/L (ref 135–145)
Total Bilirubin: 0.8 mg/dL (ref 0.3–1.2)
Total Protein: 8.2 g/dL — ABNORMAL HIGH (ref 6.5–8.1)

## 2018-06-24 LAB — LIPASE, BLOOD: Lipase: 31 U/L (ref 11–51)

## 2018-06-24 MED ORDER — FAMOTIDINE IN NACL 20-0.9 MG/50ML-% IV SOLN
20.0000 mg | Freq: Once | INTRAVENOUS | Status: AC
  Administered 2018-06-24: 20 mg via INTRAVENOUS
  Filled 2018-06-24: qty 50

## 2018-06-24 MED ORDER — LACTATED RINGERS IV BOLUS
1000.0000 mL | Freq: Once | INTRAVENOUS | Status: AC
  Administered 2018-06-24: 1000 mL via INTRAVENOUS

## 2018-06-24 MED ORDER — HALOPERIDOL LACTATE 5 MG/ML IJ SOLN
2.0000 mg | Freq: Once | INTRAMUSCULAR | Status: AC
  Administered 2018-06-24: 2 mg via INTRAVENOUS
  Filled 2018-06-24: qty 1

## 2018-06-24 MED ORDER — GI COCKTAIL ~~LOC~~
30.0000 mL | Freq: Once | ORAL | Status: AC
  Administered 2018-06-24: 30 mL via ORAL
  Filled 2018-06-24: qty 30

## 2018-06-24 MED ORDER — HYDROMORPHONE HCL 1 MG/ML IJ SOLN
0.5000 mg | Freq: Once | INTRAMUSCULAR | Status: AC
  Administered 2018-06-24: 0.5 mg via INTRAVENOUS
  Filled 2018-06-24: qty 1

## 2018-06-24 NOTE — ED Notes (Signed)
ED Provider at bedside. 

## 2018-06-24 NOTE — ED Provider Notes (Signed)
[facility]ERGENCY DEPARTMENT Provider Note   CSN: [id number] Arrival date & time: 06/24/18  [id number]     History   Chief Complaint Chief Complaint  Patient presents with  . Hematemesis    HPI Bonna GainsJoshua B Cassetta is a 37 y.o. male.  HPI   37 year old male with abdominal pain.  Pain is across upper abdomen, worse in the epigastrium.  Worse with eating.  Reports anorexia and weight loss.  This is actually an ongoing complaint for the patient.  He has been seen about once a month for the past few months including 1 month ago by myself.  He is scheduled for EGD tomorrow.  He reports compliance with his medications.  He denies any alcohol use.  He states that he now completely abstaining from NSAIDs.  Past Medical History:  Diagnosis Date  . Anxiety   . Arthritis   . Back pain   . Complication of anesthesia    had a syncopal episode after rhinoplasty  . Depression   . GERD (gastroesophageal reflux disease)   . Headache   . History of kidney stones   . Hypertension   . Seizures (HCC)   . Seizures [facility](HCC)     Patient Active Problem List   Diagnosis Date Noted  . Hematemesis without nausea 05/13/2018  . Abdominal pain, chronic, epigastric 05/13/2018  . Gastroesophageal reflux disease without esophagitis 05/13/2018  . Abdominal pain, epigastric 05/13/2018  . Muscle weakness (generalized) 09/22/2012  . Neurogenic pain 08/03/2012  . Hypokalemia 06/03/2012  . Left shoulder pain 06/03/2012  . Wound infection after surgery 06/03/2012  . Status post shoulder surgery 05/11/2012  . Shoulder injury 11/13/2011  . Contusion of arm, left 10/08/2011  . Fracture of humerus, proximal, left, closed 08/27/2011  . Fracture, humerus 06/11/2011  . CLOSED FRACTURE UNSPEC PART UPPER END HUMERUS 02/27/2011    Past Surgical History:  Procedure Laterality Date  . NOSE SURGERY     [facility]  . ORIF HUMERUS FRACTURE  04/26/2012   Procedure: OPEN REDUCTION INTERNAL FIXATION (ORIF) PROXIMAL HUMERUS  FRACTURE;  Surgeon: Vickki HearingStanley E Harrison, MD;  Location: [facility];  Service: Orthopedics;  Laterality: Left;  Open Reduction Internal Fixation of Left Proximal Humerus Fracture, Closing Wedge Osteotomy, Bone Graft  . RHINOPLASTY     [facility]  . SHOULDER SURGERY          Home Medications    Prior to Admission medications   Medication Sig Start Date End Date Taking? Authorizing Provider  pantoprazole (PROTONIX) 40 MG tablet Take 1 tablet (40 mg total) by mouth daily. One tab 30 minutes before breakfast and one tab 30 minutes before supper. Patient taking differently: Take 40 mg by mouth 2 (two) times daily before a meal. One tab 30 minutes before breakfast and one tab 30 minutes before supper. 05/12/18  Yes Setzer, Terri L, NP  SUBOXONE 8-2 MG FILM Take 1 Film by mouth 3 (three) times daily. 06/01/18  Yes [provider]  sucralfate (CARAFATE) 1 g tablet Take 1 tablet (1 g total) by mouth 4 (four) times daily -  with meals and at bedtime. 05/27/18  Yes Raeford RazorKohut, Mirjana Tarleton, MD  zolpidem (AMBIEN) 5 MG tablet Take 1 tablet by mouth at bedtime as needed for sleep.  04/27/18  Yes [provider]    Family History Family History  Problem Relation Age of Onset  . Heart disease Unknown   . Arthritis Unknown   . Anesthesia problems Neg Hx   . Hypotension Neg Hx   .  Malignant hyperthermia Neg Hx   . Pseudochol deficiency Neg Hx     Social History Social History   Tobacco Use  . Smoking status: Current Every Day Smoker    Packs/day: 0.50    Years: 7.00    Pack years: 3.50    Types: E-cigarettes  . Smokeless tobacco: Never Used  Substance Use Topics  . Alcohol use: No  . Drug use: Yes    Types: Marijuana    Comment: occ     Allergies   Tramadol   Review of Systems Review of Systems  All systems reviewed and negative, other than as noted in HPI.  Physical Exam Updated Vital Signs BP 121/72   Pulse 64   Temp 98.1 F (36.7 C) (Oral)   Resp 16   Ht 5\' 8"  (1.727 m)   Wt  65.8 kg (145 lb)   SpO2 98%   BMI 22.05 kg/m   Physical Exam  Constitutional: He appears well-developed and well-nourished. No distress.  HENT:  Head: Normocephalic and atraumatic.  Eyes: Conjunctivae are normal. Right eye exhibits no discharge. Left eye exhibits no discharge.  Neck: Neck supple.  Cardiovascular: Normal rate, regular rhythm and normal heart sounds. Exam reveals no gallop and no friction rub.  No murmur heard. Pulmonary/Chest: Effort normal and breath sounds normal. No respiratory distress.  Abdominal: Soft. He exhibits no distension. There is tenderness.  Epigastric tenderness without rebound or guarding.  No distention.  Musculoskeletal: He exhibits no edema or tenderness.  Neurological: He is alert.  Skin: Skin is warm and dry.  Psychiatric: He has a normal mood and affect. His behavior is normal. Thought content normal.  Nursing note and vitals reviewed.    ED Treatments / Results  Labs (all labs ordered are listed, but only abnormal results are displayed) Labs Reviewed  COMPREHENSIVE METABOLIC PANEL - Abnormal; Notable for the following components:      Result Value   CO2 21 (*)    Total Protein 8.2 (*)    All other components within normal limits  LIPASE, BLOOD  CBC WITH DIFFERENTIAL/PLATELET    EKG None  Radiology No results found.  Procedures Procedures (including critical care time)  Medications Ordered in ED Medications  haloperidol lactate (HALDOL) injection 2 mg (2 mg Intravenous Given 06/24/18 1033)  famotidine (PEPCID) IVPB 20 mg premix (0 mg Intravenous Stopped 06/24/18 1102)  gi cocktail (Maalox,Lidocaine,Donnatal) (30 mLs Oral Given 06/24/18 1032)  lactated ringers bolus 1,000 mL (0 mLs Intravenous Stopped 06/24/18 1148)  HYDROmorphone (DILAUDID) injection 0.5 mg (0.5 mg Intravenous Given 06/24/18 1032)     Initial Impression / Assessment and Plan / ED Course  I have reviewed the triage vital signs and the nursing  notes.  Pertinent labs & imaging results that were available during my care of the patient were reviewed by me and considered in my medical decision making (see chart for details).     37 year old male with epigastric pain.  This is strongly, chronic issue for patient.  Have actually seen in the emergency room for very similar symptoms about a month ago.  Symptoms today are consistent with his complaints at that time.  He was treated symptomatically with improvement.  Denies NSAID usage.  He is on a PPI.  This is scheduled for an EGD tomorrow.  Final Clinical Impressions(s) / ED Diagnoses   Final diagnoses:  Epigastric pain    ED Discharge Orders    None       Raeford Razor, MD  06/27/18 2236  

## 2018-06-24 NOTE — ED Notes (Signed)
Pt denies all use of NSAIDS and alcohol.
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
## 2018-06-25 ENCOUNTER — Encounter (HOSPITAL_COMMUNITY): Payer: Self-pay | Admitting: *Deleted

## 2018-06-25 ENCOUNTER — Encounter (HOSPITAL_COMMUNITY)
Admission: RE | Disposition: A | Discharge status: Home / Self Care | Payer: Self-pay | Source: Ambulatory Visit | Attending: Internal Medicine

## 2018-06-25 ENCOUNTER — Ambulatory Visit (HOSPITAL_COMMUNITY)
Admission: RE | Admit: 2018-06-25 | Discharge: 2018-06-25 | Disposition: A | Discharge status: Home / Self Care | Payer: Medicaid Other | Source: Ambulatory Visit | Attending: Internal Medicine | Admitting: Internal Medicine

## 2018-06-25 ENCOUNTER — Ambulatory Visit (HOSPITAL_COMMUNITY): Payer: Medicaid Other | Admitting: Anesthesiology

## 2018-06-25 DIAGNOSIS — K3189 Other diseases of stomach and duodenum: Secondary | ICD-10-CM | POA: Insufficient documentation

## 2018-06-25 DIAGNOSIS — F17290 Nicotine dependence, other tobacco product, uncomplicated: Secondary | ICD-10-CM | POA: Diagnosis not present

## 2018-06-25 DIAGNOSIS — M79602 Pain in left arm: Secondary | ICD-10-CM | POA: Diagnosis not present

## 2018-06-25 DIAGNOSIS — Z87442 Personal history of urinary calculi: Secondary | ICD-10-CM | POA: Insufficient documentation

## 2018-06-25 DIAGNOSIS — K219 Gastro-esophageal reflux disease without esophagitis: Secondary | ICD-10-CM | POA: Insufficient documentation

## 2018-06-25 DIAGNOSIS — K2980 Duodenitis without bleeding: Secondary | ICD-10-CM | POA: Insufficient documentation

## 2018-06-25 DIAGNOSIS — R1013 Epigastric pain: Secondary | ICD-10-CM

## 2018-06-25 DIAGNOSIS — K259 Gastric ulcer, unspecified as acute or chronic, without hemorrhage or perforation: Secondary | ICD-10-CM | POA: Insufficient documentation

## 2018-06-25 DIAGNOSIS — Z79899 Other long term (current) drug therapy: Secondary | ICD-10-CM | POA: Insufficient documentation

## 2018-06-25 DIAGNOSIS — M1990 Unspecified osteoarthritis, unspecified site: Secondary | ICD-10-CM | POA: Insufficient documentation

## 2018-06-25 DIAGNOSIS — F329 Major depressive disorder, single episode, unspecified: Secondary | ICD-10-CM | POA: Insufficient documentation

## 2018-06-25 DIAGNOSIS — G8929 Other chronic pain: Secondary | ICD-10-CM | POA: Insufficient documentation

## 2018-06-25 DIAGNOSIS — K920 Hematemesis: Secondary | ICD-10-CM | POA: Insufficient documentation

## 2018-06-25 DIAGNOSIS — I10 Essential (primary) hypertension: Secondary | ICD-10-CM | POA: Insufficient documentation

## 2018-06-25 DIAGNOSIS — Z888 Allergy status to other drugs, medicaments and biological substances status: Secondary | ICD-10-CM | POA: Insufficient documentation

## 2018-06-25 DIAGNOSIS — F419 Anxiety disorder, unspecified: Secondary | ICD-10-CM | POA: Diagnosis not present

## 2018-06-25 DIAGNOSIS — K2970 Gastritis, unspecified, without bleeding: Secondary | ICD-10-CM

## 2018-06-25 HISTORY — PX: ESOPHAGOGASTRODUODENOSCOPY (EGD) WITH PROPOFOL: SHX5813

## 2018-06-25 HISTORY — PX: BIOPSY: SHX5522

## 2018-06-25 SURGERY — ESOPHAGOGASTRODUODENOSCOPY (EGD) WITH PROPOFOL
Anesthesia: Monitor Anesthesia Care

## 2018-06-25 MED ORDER — GLYCOPYRROLATE 0.2 MG/ML IJ SOLN
INTRAMUSCULAR | Status: DC | PRN
  Administered 2018-06-25: 0.2 mg via INTRAVENOUS

## 2018-06-25 MED ORDER — LIDOCAINE HCL (PF) 1 % IJ SOLN
INTRAMUSCULAR | Status: AC
  Filled 2018-06-25: qty 5

## 2018-06-25 MED ORDER — CHLORHEXIDINE GLUCONATE CLOTH 2 % EX PADS: 6.0000 | MEDICATED_PAD | Freq: Once | CUTANEOUS | Status: DC

## 2018-06-25 MED ORDER — LACTATED RINGERS IV SOLN: INTRAVENOUS | Status: DC

## 2018-06-25 MED ORDER — MEPERIDINE HCL 100 MG/ML IJ SOLN: 6.2500 mg | INTRAMUSCULAR | Status: DC | PRN

## 2018-06-25 MED ORDER — GLYCOPYRROLATE 0.2 MG/ML IJ SOLN
INTRAMUSCULAR | Status: AC
  Filled 2018-06-25: qty 1

## 2018-06-25 MED ORDER — SUCRALFATE 1 GM/10ML PO SUSP: 1.0000 g | Freq: Four times a day (QID) | ORAL | 1 refills | Status: DC

## 2018-06-25 MED ORDER — PROPOFOL 10 MG/ML IV BOLUS
INTRAVENOUS | Status: AC
  Filled 2018-06-25: qty 40

## 2018-06-25 MED ORDER — LIDOCAINE HCL URETHRAL/MUCOSAL 2 % EX GEL
CUTANEOUS | Status: DC | PRN
  Administered 2018-06-25: 1 via TOPICAL

## 2018-06-25 MED ORDER — PROMETHAZINE HCL 25 MG/ML IJ SOLN: 6.2500 mg | INTRAMUSCULAR | Status: DC | PRN

## 2018-06-25 MED ORDER — MIDAZOLAM HCL 5 MG/5ML IJ SOLN
INTRAMUSCULAR | Status: DC | PRN
  Administered 2018-06-25: 2 mg via INTRAVENOUS

## 2018-06-25 MED ORDER — LACTATED RINGERS IV SOLN
INTRAVENOUS | Status: DC
  Administered 2018-06-25: 1000 mL via INTRAVENOUS

## 2018-06-25 MED ORDER — LIDOCAINE HCL (CARDIAC) PF 50 MG/5ML IV SOSY
PREFILLED_SYRINGE | INTRAVENOUS | Status: DC | PRN
Start: 2018-06-25 — End: 2018-06-25
  Administered 2018-06-25: 30 mg via INTRAVENOUS

## 2018-06-25 MED ORDER — SUCRALFATE 1 G PO TABS: 1.0000 g | ORAL_TABLET | Freq: Three times a day (TID) | ORAL | 2 refills | Status: DC

## 2018-06-25 MED ORDER — SUCCINYLCHOLINE CHLORIDE 20 MG/ML IJ SOLN
INTRAMUSCULAR | Status: AC
  Filled 2018-06-25: qty 2

## 2018-06-25 MED ORDER — PROPOFOL 500 MG/50ML IV EMUL
INTRAVENOUS | Status: DC | PRN
  Administered 2018-06-25: 200 ug/kg/min via INTRAVENOUS
  Administered 2018-06-25: 08:00:00 via INTRAVENOUS

## 2018-06-25 MED ORDER — MIDAZOLAM HCL 2 MG/2ML IJ SOLN
INTRAMUSCULAR | Status: AC
  Filled 2018-06-25: qty 2

## 2018-06-25 NOTE — Discharge Instructions (Signed)
No Excedrin and BC Goody powder or similar medications. Resume usual medications as before. Sucralfate 1 mg by mouth 30 to 60 minutes before each meal and at bedtime. Resume usual diet. No driving for 24 hours. Physician will call with biopsy results and further recommendations. Esophagogastroduodenoscopy, Care After Refer to this sheet in the next few weeks. These instructions provide you with information about caring for yourself after your procedure. Your health care provider may also give you more specific instructions. Your treatment has been planned according to current medical practices, but problems sometimes occur. Call your health care provider if you have any problems or questions after your procedure. What can I expect after the procedure? After the procedure, it is common to have:  A sore throat.  Nausea.  Bloating.  Dizziness.  Fatigue.  Follow these instructions at home:  Do not eat or drink anything until the numbing medicine (local anesthetic) has worn off and your gag reflex has returned. You will know that the local anesthetic has worn off when you can swallow comfortably.  Do not drive for 24 hours if you received a medicine to help you relax (sedative).  If your health care provider took a tissue sample for testing during the procedure, make sure to get your test results. This is your responsibility. Ask your health care provider or the department performing the test when your results will be ready.  Keep all follow-up visits as told by your health care provider. This is important. Contact a health care provider if:  You cannot stop coughing.  You are not urinating.  You are urinating less than usual. Get help right away if:  You have trouble swallowing.  You cannot eat or drink.  You have throat or chest pain that gets worse.  You are dizzy or light-headed.  You faint.  You have nausea or vomiting.  You have chills.  You have a fever.  You  have severe abdominal pain.  You have black, tarry, or bloody stools. This information is not intended to replace advice given to you by your health care provider. Make sure you discuss any questions you have with your health care provider. Document Released: 11/17/2012 Document Revised: 05/08/2016 Document Reviewed: 10/25/2015 Elsevier Interactive Patient Education  [employer]2018 [employer]

## 2018-06-25 NOTE — Op Note (Signed)
[facility] Patient Name: Alan FasterJoshua Kesinger Procedure Date: 06/25/2018 7:18 AM MRN: [id number] Date of Birth: 03/02/1981 Attending MD: Lionel DecemberNajeeb Missi Mcmackin , MD CSN: [id number] Age: 37 Admit Type: Outpatient Procedure:                Upper GI endoscopy Indications:              Epigastric abdominal pain, Hematemesis Providers:                Lionel DecemberNajeeb Tykee Heideman, MD, Loma MessingLurae B. Patsy LagerAlbert RN, RN, Edythe ClarityKelly                            C[facility], Technician Referring MD:              Medicines:                Lidocaine spray, Propofol per Anesthesia Complications:            No immediate complications. Estimated Blood Loss:     Estimated blood loss was minimal. Procedure:                Pre-Anesthesia Assessment:                           - Prior to the procedure, a History and Physical                            was performed, and patient medications and                            allergies were reviewed. The patient's tolerance of                            previous anesthesia was also reviewed. The risks                            and benefits of the procedure and the sedation                            options and risks were discussed with the patient.                            All questions were answered, and informed consent                            was obtained. Prior Anticoagulants: The patient                            last took previous NSAID medication 1 day prior to                            the procedure. ASA Grade Assessment: III - A                            patient with severe systemic disease. After  reviewing the risks and benefits, the patient was                            deemed in satisfactory condition to undergo the                            procedure.                           After obtaining informed consent, the endoscope was                            passed under direct vision. Throughout the                            procedure, the patient's blood  pressure, pulse, and                            oxygen saturations were monitored continuously. The                            [id number]-H190 ([id number]) scope was introduced through the                            and advanced to the second part of duodenum. The                            upper GI endoscopy was accomplished without                            difficulty. The patient tolerated the procedure                            well. Scope In: 7:36:47 AM Scope Out: 7:48:28 AM Total Procedure Duration: 0 hours 11 minutes 41 seconds  Findings:      The examined esophagus was normal.      The Z-line was regular and was found 42 cm from the incisors.      A few, non-bleeding erosions were found in the gastric body. There were       no stigmata of recent bleeding. Biopsies were taken with a cold forceps       for histology. The pathology specimen was placed into Bottle Number 2.      Patchy mild inflammation characterized by congestion (edema), erosions       and erythema was found in the gastric antrum and in the prepyloric       region of the stomach. Biopsies were taken with a cold forceps for       histology. The pathology specimen was placed into Bottle Number 1.      The cardia, gastric fundus and pylorus were normal.      Patchy moderate inflammation characterized by congestion (edema),       erosions, erythema and friability was found in the duodenal bulb.      The second portion of the duodenum was normal. Impression:               -  Normal esophagus.                           - Z-line regular, 42 cm from the incisors.                           - Non-bleeding erosive gastropathy. Biopsied.                           - Gastritis. Biopsied.                           - Normal cardia, gastric fundus and pylorus.                           - Duodenitis.                           - Normal second portion of the duodenum. Moderate Sedation:      Per Anesthesia Care Recommendation:           -  Patient has a contact number available for                            emergencies. The signs and symptoms of potential                            delayed complications were discussed with the                            patient. Return to normal activities tomorrow.                            Written discharge instructions were provided to the                            patient.                           - Resume previous diet today.                           - Continue present medications.                           - CSucralfate 1 g po ac and qhs.                           - No NSAIDs.                           - Await pathology results. Procedure Code(s):        --- Professional ---                           [phone], Esophagogastroduodenoscopy, flexible,  transoral; with biopsy, single or multiple Diagnosis Code(s):        --- Professional ---                           K31.89, Other diseases of stomach and duodenum                           K29.70, Gastritis, unspecified, without bleeding                           K29.80, Duodenitis without bleeding                           R10.13, Epigastric pain                           K92.0, Hematemesis CPT copyright 2017 American Medical Association. All rights reserved. The codes documented in this report are preliminary and upon coder review may  be revised to meet current compliance requirements. Lionel December, MD Lionel December, MD 06/25/2018 8:01:48 AM This report has been signed electronically. Number of Addenda: 0

## 2018-06-25 NOTE — Anesthesia Procedure Notes (Signed)
Performed by: Adams, Amy A, CRNA       

## 2018-06-25 NOTE — Anesthesia Preprocedure Evaluation (Signed)
Anesthesia Evaluation  Patient identified by MRN, date of birth, ID band Patient awake    Reviewed: Allergy & Precautions, H&P , NPO status , Patient's Chart, lab work & pertinent test results, reviewed documented beta blocker date and time   Airway Mallampati: II  TM Distance: >3 FB Neck ROM: full    Dental no notable dental hx. (+) Poor Dentition, Dental Advidsory Given   Pulmonary neg pulmonary ROS, Current Smoker,    Pulmonary exam normal breath sounds clear to auscultation       Cardiovascular Exercise Tolerance: Good hypertension, On Medications negative cardio ROS   Rhythm:regular Rate:Normal     Neuro/Psych  Headaches, Seizures -,  PSYCHIATRIC DISORDERS Anxiety Depression negative neurological ROS  negative psych ROS   GI/Hepatic negative GI ROS, Neg liver ROS, GERD  ,  Endo/Other  negative endocrine ROS  Renal/GU negative Renal ROS  negative genitourinary   Musculoskeletal   Abdominal   Peds  Hematology negative hematology ROS (+)   Anesthesia Other Findings Suboxone for one year.  H/o opiate. Heroin abuse Recent episode of CP worked up- anxiety essnetial HTN dx with NO meds at this time SZ hx- no meds Carried molars Episodic intractible N/V, hematemeses, LGI bleed and weight loss for 5-plus weeks Substituted Vape for tobacco  Reproductive/Obstetrics negative OB ROS                             Anesthesia Physical Anesthesia Plan  ASA: III  Anesthesia Plan: MAC   Post-op Pain Management:    Induction:   PONV Risk Score and Plan:   Airway Management Planned:   Additional Equipment:   Intra-op Plan:   Post-operative Plan:   Informed Consent: I have reviewed the patients History and Physical, chart, labs and discussed the procedure including the risks, benefits and alternatives for the proposed anesthesia with the patient or authorized representative who has  indicated his/her understanding and acceptance.   Dental Advisory Given  Plan Discussed with: CRNA and Anesthesiologist  Anesthesia Plan Comments:         Anesthesia Quick Evaluation

## 2018-06-25 NOTE — Anesthesia Postprocedure Evaluation (Signed)
Anesthesia Post Note  Patient: Alan Jennings  Procedure(s) Performed: ESOPHAGOGASTRODUODENOSCOPY (EGD) WITH PROPOFOL (N/A ) BIOPSY  Patient location during evaluation: PACU Anesthesia Type: MAC Level of consciousness: awake and alert and oriented Pain management: pain level controlled Vital Signs Assessment: post-procedure vital signs reviewed and stable Respiratory status: spontaneous breathing Cardiovascular status: stable Postop Assessment: no apparent nausea or vomiting Anesthetic complications: no     Last Vitals:  Vitals:   06/25/18 0634 06/25/18 0635  BP: (!) 141/93 (!) 141/93  Pulse: 83   Resp: 16 13  Temp: 37 C   SpO2: 99% 99%    Last Pain:  Vitals:   06/25/18 0729  TempSrc:   PainSc: 6                  ADAMS, AMY A
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
## 2018-06-25 NOTE — Transfer of Care (Signed)
Immediate Anesthesia Transfer of Care Note  Patient: Alan Jennings  Procedure(s) Performed: ESOPHAGOGASTRODUODENOSCOPY (EGD) WITH PROPOFOL (N/A ) BIOPSY  Patient Location: PACU  Anesthesia Type:MAC  Level of Consciousness: awake, alert , oriented and patient cooperative  Airway & Oxygen Therapy: Patient Spontanous Breathing  Post-op Assessment: Report given to RN and Post -op Vital signs reviewed and stable  Post vital signs: Reviewed and stable  Last Vitals:  Vitals Value Taken Time  BP    Temp    Pulse 81 06/25/2018  7:56 AM  Resp 14 06/25/2018  7:56 AM  SpO2 100 % 06/25/2018  7:56 AM  Vitals shown include unvalidated device data.  Last Pain:  Vitals:   06/25/18 0729  TempSrc:   PainSc: 6       Patients Stated Pain Goal: 7 (22/97/98 9211)  Complications: No apparent anesthesia complications

## 2018-06-27 ENCOUNTER — Emergency Department (HOSPITAL_COMMUNITY)
Admission: EM | Admit: 2018-06-27 | Discharge: 2018-06-27 | Disposition: A | Discharge status: Home / Self Care | Payer: Medicaid Other | Attending: Emergency Medicine | Admitting: Emergency Medicine

## 2018-06-27 ENCOUNTER — Other Ambulatory Visit: Payer: Self-pay

## 2018-06-27 ENCOUNTER — Encounter (HOSPITAL_COMMUNITY): Payer: Self-pay | Admitting: Emergency Medicine

## 2018-06-27 DIAGNOSIS — Z79899 Other long term (current) drug therapy: Secondary | ICD-10-CM | POA: Diagnosis not present

## 2018-06-27 DIAGNOSIS — R002 Palpitations: Secondary | ICD-10-CM

## 2018-06-27 DIAGNOSIS — I10 Essential (primary) hypertension: Secondary | ICD-10-CM | POA: Diagnosis not present

## 2018-06-27 DIAGNOSIS — F17210 Nicotine dependence, cigarettes, uncomplicated: Secondary | ICD-10-CM | POA: Diagnosis not present

## 2018-06-27 DIAGNOSIS — F419 Anxiety disorder, unspecified: Secondary | ICD-10-CM

## 2018-06-27 HISTORY — DX: Post-traumatic stress disorder, unspecified: F43.10

## 2018-06-27 LAB — CBC WITH DIFFERENTIAL/PLATELET
Basophils Absolute: 0 K/uL (ref 0.0–0.1)
Basophils Relative: 0 %
Eosinophils Absolute: 0 K/uL (ref 0.0–0.7)
Eosinophils Relative: 0 %
HCT: 41 % (ref 39.0–52.0)
Hemoglobin: 13.9 g/dL (ref 13.0–17.0)
Lymphocytes Relative: 24 %
Lymphs Abs: 2 K/uL (ref 0.7–4.0)
MCH: 31.3 pg (ref 26.0–34.0)
MCHC: 33.9 g/dL (ref 30.0–36.0)
MCV: 92.3 fL (ref 78.0–100.0)
Monocytes Absolute: 0.5 K/uL (ref 0.1–1.0)
Monocytes Relative: 6 %
Neutro Abs: 5.7 K/uL (ref 1.7–7.7)
Neutrophils Relative %: 70 %
Platelets: 218 K/uL (ref 150–400)
RBC: 4.44 MIL/uL (ref 4.22–5.81)
RDW: 12.6 % (ref 11.5–15.5)
WBC: 8.1 K/uL (ref 4.0–10.5)

## 2018-06-27 LAB — TSH: TSH: 0.41 u[IU]/mL (ref 0.350–4.500)

## 2018-06-27 LAB — BASIC METABOLIC PANEL WITH GFR
Anion gap: 9 (ref 5–15)
BUN: 6 mg/dL (ref 6–20)
CO2: 25 mmol/L (ref 22–32)
Calcium: 9.5 mg/dL (ref 8.9–10.3)
Chloride: 106 mmol/L (ref 98–111)
Creatinine, Ser: 0.84 mg/dL (ref 0.61–1.24)
GFR calc Af Amer: >60 mL/min
GFR calc non Af Amer: >60 mL/min
Glucose, Bld: 88 mg/dL (ref 70–99)
Potassium: 4 mmol/L (ref 3.5–5.1)
Sodium: 140 mmol/L (ref 135–145)

## 2018-06-27 MED ORDER — DIAZEPAM 5 MG PO TABS
5.0000 mg | ORAL_TABLET | Freq: Once | ORAL | Status: AC
  Administered 2018-06-27: 5 mg via ORAL
  Filled 2018-06-27: qty 1

## 2018-06-27 MED ORDER — DIAZEPAM 5 MG PO TABS: 5.0000 mg | ORAL_TABLET | Freq: Two times a day (BID) | ORAL | 0 refills | Status: DC

## 2018-06-27 NOTE — ED Notes (Signed)
Pt also reports he has an appt with his therapist in [place] on tues for referral to psychiatrist

## 2018-06-27 NOTE — ED Triage Notes (Signed)
Pt reports he has been unable to sleep since Thursday, even with his usual sleeping medications.  Has been trying to get into a psychiatrist for feelings of anger and depression for a while now without success.  States his only medications are prescribed by the pain clinic in [place].

## 2018-06-27 NOTE — ED Provider Notes (Signed)
[facility] EMERGENCY DEPARTMENT Provider Note   CSN: [id number] Arrival date & time: 06/27/18  0813     History   Chief Complaint Chief Complaint  Patient presents with  . Anxiety    HPI Alan Jennings is a 37 y.o. male with a long-standing history of anxiety associated with PTSD presenting for evaluation of insomnia and escalating episodes of panic associated with tachycardia.  He generally has a panic attack every other month on average, but in the past 2 weeks has had at least 6 episodes which is described as sensation of panic with heart palpitations, diaphoresis and peripheral numbness.  These episodes generally wake him from sleep and reports he has had no sleep for the past 3 nights.  He is currently followed by chronic pain management physician in [place] who prescribes Suboxone for left shoulder pain and he is also prescribed Ambien and trazodone for problems with insomnia.  He has taken his medications but have not helped him the past 3 nights.  He denies chest pain or shortness of breath with these episodes.  He does report reduced appetite and has had weight loss in the past several weeks.  He has had normal thyroid tests in the past, denies caffeine use, denies drug or alcohol use.  Also denies homicidal or suicidal ideation.  His last episode occurred this morning about 4 hours before arrival.  He is scheduled to see his pain provider in 2 days who is currently in the process of referring him to psychiatry.  He currently sees a hypnotherapist but does not believe this treatment is working for him.  HPI  Past Medical History:  Diagnosis Date  . Anxiety   . Arthritis   . Back pain   . Complication of anesthesia    had a syncopal episode after rhinoplasty  . Depression   . GERD (gastroesophageal reflux disease)   . Headache   . History of kidney stones   . Hypertension   . PTSD (post-traumatic stress disorder)   . Seizures ([facility])   . Seizures [facility])     Patient  Active Problem List   Diagnosis Date Noted  . Hematemesis without nausea 05/13/2018  . Abdominal pain, chronic, epigastric 05/13/2018  . Gastroesophageal reflux disease without esophagitis 05/13/2018  . Abdominal pain, epigastric 05/13/2018  . Muscle weakness (generalized) 09/22/2012  . Neurogenic pain 08/03/2012  . Hypokalemia 06/03/2012  . Left shoulder pain 06/03/2012  . Wound infection after surgery 06/03/2012  . Status post shoulder surgery 05/11/2012  . Shoulder injury 11/13/2011  . Contusion of arm, left 10/08/2011  . Fracture of humerus, proximal, left, closed 08/27/2011  . Fracture, humerus 06/11/2011  . CLOSED FRACTURE UNSPEC PART UPPER END HUMERUS 02/27/2011    Past Surgical History:  Procedure Laterality Date  . NOSE SURGERY     [facility]  . ORIF HUMERUS FRACTURE  04/26/2012   Procedure: OPEN REDUCTION INTERNAL FIXATION (ORIF) PROXIMAL HUMERUS FRACTURE;  Surgeon: Vickki HearingStanley E Harrison, MD;  Location: [facility];  Service: Orthopedics;  Laterality: Left;  Open Reduction Internal Fixation of Left Proximal Humerus Fracture, Closing Wedge Osteotomy, Bone Graft  . RHINOPLASTY     [facility]  . SHOULDER SURGERY          Home Medications    Prior to Admission medications   Medication Sig Start Date End Date Taking? Authorizing Provider  pantoprazole (PROTONIX) 40 MG tablet Take 1 tablet (40 mg total) by mouth daily. One tab 30 minutes before breakfast and one tab  30 minutes before supper. Patient taking differently: Take 40 mg by mouth 2 (two) times daily before a meal. One tab 30 minutes before breakfast and one tab 30 minutes before supper. 05/12/18  Yes Setzer, Terri L, NP  SUBOXONE 8-2 MG FILM Take 1 Film by mouth 2 (two) times daily as needed (moderate to severe pain (plate in arm)).  06/01/18  Yes [provider]  sucralfate (CARAFATE) 1 g tablet Take 1 tablet (1 g total) by mouth 4 (four) times daily -  with meals and at bedtime. 06/25/18  Yes Rehman, Joline Maxcy, MD  traZODone  (DESYREL) 50 MG tablet Take 50 mg by mouth at bedtime.   Yes [provider]  zolpidem (AMBIEN) 5 MG tablet Take 1 tablet by mouth at bedtime.  04/27/18  Yes [provider]  diazepam (VALIUM) 5 MG tablet Take 1 tablet (5 mg total) by mouth 2 (two) times daily. 06/27/18   Burgess Amor, PA-C  sucralfate (CARAFATE) 1 GM/10ML suspension Take 10 mLs (1 g total) by mouth 4 (four) times daily. 06/25/18 06/25/19  Malissa Hippo, MD    Family History Family History  Problem Relation Age of Onset  . Heart disease Unknown   . Arthritis Unknown   . Anesthesia problems Neg Hx   . Hypotension Neg Hx   . Malignant hyperthermia Neg Hx   . Pseudochol deficiency Neg Hx     Social History Social History   Tobacco Use  . Smoking status: Current Every Day Smoker    Packs/day: 0.50    Years: 7.00    Pack years: 3.50    Types: E-cigarettes  . Smokeless tobacco: Never Used  Substance Use Topics  . Alcohol use: No  . Drug use: Not Currently    Types: Marijuana     Allergies   Tramadol   Review of Systems Review of Systems  Constitutional: Positive for unexpected weight change. Negative for chills and fever.  HENT: Negative for congestion and sore throat.   Eyes: Negative.   Respiratory: Negative for cough, choking, chest tightness and shortness of breath.   Cardiovascular: Positive for palpitations. Negative for chest pain and leg swelling.  Gastrointestinal: Negative for abdominal pain, nausea and vomiting.  Genitourinary: Negative.   Musculoskeletal: Negative for arthralgias, joint swelling and neck pain.  Skin: Negative.  Negative for rash and wound.  Neurological: Negative for dizziness, weakness, light-headedness, numbness and headaches.  Psychiatric/Behavioral: The patient is nervous/anxious.      Physical Exam Updated Vital Signs BP (!) 148/107   Pulse 79   Temp 98 F (36.7 C) (Oral)   Resp 15   Ht 5\' 8"  (1.727 m)   Wt 65.8 kg (145 lb)   SpO2 100%   BMI  22.05 kg/m   Physical Exam  Constitutional: He appears well-developed and well-nourished.  HENT:  Head: Normocephalic and atraumatic.  Eyes: Conjunctivae are normal.  Neck: Normal range of motion.  Cardiovascular: Normal rate, regular rhythm, normal heart sounds and intact distal pulses.  No murmur heard. Pulmonary/Chest: Effort normal and breath sounds normal. He has no wheezes.  Abdominal: Soft. Bowel sounds are normal. There is no tenderness.  Musculoskeletal: Normal range of motion.  Neurological: He is alert.  Skin: Skin is warm and dry.  Psychiatric: He has a normal mood and affect. His behavior is normal. Thought content normal.  Appears calm during ed visit.  Nursing note and vitals reviewed.    ED Treatments / Results  Labs (all labs ordered  are listed, but only abnormal results are displayed) Labs Reviewed  TSH  BASIC METABOLIC PANEL  CBC WITH DIFFERENTIAL/PLATELET    EKG EKG Interpretation  Date/Time:  Sunday June 27 2018 09:51:35 EDT Ventricular Rate:  65 PR Interval:    QRS Duration: 75 QT Interval:  373 QTC Calculation: 388 R Axis:   74 Text Interpretation:  Sinus rhythm Atrial premature complexes in couplets Anteroseptal infarct, old Confirmed by Cook, Brian ([id number]) on 06/27/2018 10:00:15 AM   Radiology No results found.  Procedures Procedures (including critical care time)  Medications Ordered in ED Medications  diazepam (VALIUM) tablet 5 mg (5 mg Oral Given 06/27/18 1000)     Initial Impression / Assessment and Plan / ED Course  I have reviewed the triage vital signs and the nursing notes.  Pertinent labs & imaging results that were available during my care of the patient were reviewed by me and considered in my medical decision making (see chart for details).     Patient with long-standing history of anxiety and panic, escalation of symptoms in the past several weeks.  Denies homicidal or suicidal ideation.  He does not have a completely  normal EKG, and may benefit from cardiology evaluation/event monitor to determine possible physiologic cause for these palpitations.  He was referred to cardiology for this.  Labs stable including a normal range TSH.  He was prescribed a small quantity of diazepam to help his anxiety and help him sleep for the next several days with plans to follow-up with his specialist in [place] in 2 days.  Final Clinical Impressions(s) / ED Diagnoses   Final diagnoses:  Anxiety  Palpitations    ED Discharge Orders        Ordered    diazepam (VALIUM) 5 MG tablet  2 times daily     07 /14/19 1053       Burgess Amor, PA-C 06/27/18 1135    Donnetta Hutching, MD 06/29/18 [id number]

## 2018-06-27 NOTE — ED Notes (Addendum)
Pt reports hx of PTSD  Followed by pain clinic where he reports he is advised to take his Suboxone PRN as needed for arm pain- he does not take daily due to half life per pt  He reports disturbed sleep in spite of ambien and trazodone

## 2018-06-27 NOTE — Discharge Instructions (Addendum)
Use caution taking the diazepam prescribed as this will make you sleepy. You may take it twice daily, but may choose to just take at bedtime to help you sleep.  Plan to see your provider on Tuesday as you have scheduled.

## 2018-06-28 MED FILL — SUBOXONE 8 MG-2 MG SL FILM: 8-2 | 29 days supply | Qty: 87 | Fill #0

## 2018-06-30 ENCOUNTER — Encounter (HOSPITAL_COMMUNITY): Payer: Self-pay | Admitting: Internal Medicine

## 2018-07-25 ENCOUNTER — Emergency Department (HOSPITAL_COMMUNITY)
Admission: EM | Admit: 2018-07-25 | Discharge: 2018-07-25 | Disposition: A | Discharge status: Home / Self Care | Payer: Medicaid Other | Attending: Emergency Medicine | Admitting: Emergency Medicine

## 2018-07-25 ENCOUNTER — Encounter (HOSPITAL_COMMUNITY): Payer: Self-pay | Admitting: Emergency Medicine

## 2018-07-25 ENCOUNTER — Other Ambulatory Visit: Payer: Self-pay

## 2018-07-25 ENCOUNTER — Emergency Department (HOSPITAL_COMMUNITY): Payer: Medicaid Other

## 2018-07-25 DIAGNOSIS — S8392XA Sprain of unspecified site of left knee, initial encounter: Secondary | ICD-10-CM | POA: Insufficient documentation

## 2018-07-25 DIAGNOSIS — Z79899 Other long term (current) drug therapy: Secondary | ICD-10-CM | POA: Insufficient documentation

## 2018-07-25 DIAGNOSIS — Y93K1 Activity, walking an animal: Secondary | ICD-10-CM | POA: Diagnosis not present

## 2018-07-25 DIAGNOSIS — Y999 Unspecified external cause status: Secondary | ICD-10-CM | POA: Insufficient documentation

## 2018-07-25 DIAGNOSIS — I10 Essential (primary) hypertension: Secondary | ICD-10-CM | POA: Diagnosis not present

## 2018-07-25 DIAGNOSIS — W010XXA Fall on same level from slipping, tripping and stumbling without subsequent striking against object, initial encounter: Secondary | ICD-10-CM | POA: Diagnosis not present

## 2018-07-25 DIAGNOSIS — S8992XA Unspecified injury of left lower leg, initial encounter: Secondary | ICD-10-CM | POA: Diagnosis present

## 2018-07-25 DIAGNOSIS — F17200 Nicotine dependence, unspecified, uncomplicated: Secondary | ICD-10-CM | POA: Insufficient documentation

## 2018-07-25 DIAGNOSIS — Y929 Unspecified place or not applicable: Secondary | ICD-10-CM | POA: Insufficient documentation

## 2018-07-25 NOTE — ED Provider Notes (Signed)
[facility] EMERGENCY DEPARTMENT Provider Note   CSN: [id number] Arrival date & time: 07/25/18  0909     History   Chief Complaint Chief Complaint  Patient presents with  . Knee Pain    HPI Alan Jennings is a 37 y.o. male.  37 year old male presents with complaint of left knee injury.  Patient states that he was walking his dog when the dog's leash became his leg and the dog took off running causing the patient to fall to the ground.  Patient states he felt a tearing sensation in the front of his knee, and hit the medial aspect of his knee on the concrete.  Patient reports pain, swelling, bruising to the medial left knee.  No prior knee injuries.  Patient is able to bear weight with pain.  No other complaints or concerns.     Past Medical History:  Diagnosis Date  . Anxiety   . Arthritis   . Back pain   . Complication of anesthesia    had a syncopal episode after rhinoplasty  . Depression   . GERD (gastroesophageal reflux disease)   . Headache   . History of kidney stones   . Hypertension   . PTSD (post-traumatic stress disorder)   . Seizures (HCC)   . Seizures [facility])     Patient Active Problem List   Diagnosis Date Noted  . Hematemesis without nausea 05/13/2018  . Abdominal pain, chronic, epigastric 05/13/2018  . Gastroesophageal reflux disease without esophagitis 05/13/2018  . Abdominal pain, epigastric 05/13/2018  . Muscle weakness (generalized) 09/22/2012  . Neurogenic pain 08/03/2012  . Hypokalemia 06/03/2012  . Left shoulder pain 06/03/2012  . Wound infection after surgery 06/03/2012  . Status post shoulder surgery 05/11/2012  . Shoulder injury 11/13/2011  . Contusion of arm, left 10/08/2011  . Fracture of humerus, proximal, left, closed 08/27/2011  . Fracture, humerus 06/11/2011  . CLOSED FRACTURE UNSPEC PART UPPER END HUMERUS 02/27/2011    Past Surgical History:  Procedure Laterality Date  . BIOPSY  06/25/2018   Procedure: BIOPSY;  Surgeon:  Malissa Hippo, MD;  Location: [facility];  Service: Endoscopy;;  gastric  . ESOPHAGOGASTRODUODENOSCOPY (EGD) WITH PROPOFOL N/A 06/25/2018   Procedure: ESOPHAGOGASTRODUODENOSCOPY (EGD) WITH PROPOFOL;  Surgeon: Malissa Hippo, MD;  Location: [facility] END[facility];  Service: Endoscopy;  Laterality: N/A;  . NOSE SURGERY     [facility]  . ORIF HUMERUS FRACTURE  04/26/2012   Procedure: OPEN REDUCTION INTERNAL FIXATION (ORIF) PROXIMAL HUMERUS FRACTURE;  Surgeon: Vickki Hearing, MD;  Location: [facility] ORS;  Service: Orthopedics;  Laterality: Left;  Open Reduction Internal Fixation of Left Proximal Humerus Fracture, Closing Wedge Osteotomy, Bone Graft  . RHINOPLASTY     [facility]  . SHOULDER SURGERY          Home Medications    Prior to Admission medications   Medication Sig Start Date End Date Taking? Authorizing Provider  diazepam (VALIUM) 5 MG tablet Take 1 tablet (5 mg total) by mouth 2 (two) times daily. 06/27/18   Burgess Amor, PA-C  pantoprazole (PROTONIX) 40 MG tablet Take 1 tablet (40 mg total) by mouth daily. One tab 30 minutes before breakfast and one tab 30 minutes before supper. Patient taking differently: Take 40 mg by mouth 2 (two) times daily before a meal. One tab 30 minutes before breakfast and one tab 30 minutes before supper. 05/12/18   Setzer, Terri L, NP  SUBOXONE 8-2 MG FILM Take 1 Film by mouth 2 (two)  times daily as needed (moderate to severe pain (plate in arm)).  06/01/18   [provider]  sucralfate (CARAFATE) 1 g tablet Take 1 tablet (1 g total) by mouth 4 (four) times daily -  with meals and at bedtime. 06/25/18   Rehman, Joline Maxcy, MD  sucralfate (CARAFATE) 1 GM/10ML suspension Take 10 mLs (1 g total) by mouth 4 (four) times daily. 06/25/18 06/25/19  Malissa Hippo, MD  traZODone (DESYREL) 50 MG tablet Take 50 mg by mouth at bedtime.    [provider]  zolpidem (AMBIEN) 5 MG tablet Take 1 tablet by mouth at bedtime.  04/27/18   [provider]    Family  History Family History  Problem Relation Age of Onset  . Heart disease Unknown   . Arthritis Unknown   . Anesthesia problems Neg Hx   . Hypotension Neg Hx   . Malignant hyperthermia Neg Hx   . Pseudochol deficiency Neg Hx     Social History Social History   Tobacco Use  . Smoking status: Current Every Day Smoker    Packs/day: 0.50    Years: 7.00    Pack years: 3.50    Types: E-cigarettes  . Smokeless tobacco: Never Used  Substance Use Topics  . Alcohol use: No  . Drug use: Not Currently    Types: Marijuana     Allergies   Tramadol   Review of Systems Review of Systems  Constitutional: Negative for fever.  Musculoskeletal: Positive for arthralgias, joint swelling and myalgias.  Skin: Positive for wound. Negative for rash.  Allergic/Immunologic: Negative for immunocompromised state.  Neurological: Negative for weakness and numbness.  Hematological: Does not bruise/bleed easily.  Psychiatric/Behavioral: Negative for self-injury.  All other systems reviewed and are negative.    Physical Exam Updated Vital Signs BP (!) 162/91 (BP Location: Left Arm)   Pulse 91   Temp 98.4 F (36.9 C) (Oral)   Resp 18   Ht 5\' 8"  (1.727 m)   Wt 68 kg   SpO2 99%   BMI 22.81 kg/m   Physical Exam  Constitutional: He is oriented to person, place, and time. He appears well-developed and well-nourished. No distress.  HENT:  Head: Normocephalic and atraumatic.  Cardiovascular: Intact distal pulses.  Pulmonary/Chest: Effort normal.  Musculoskeletal: He exhibits tenderness. He exhibits no deformity.       Left knee: He exhibits decreased range of motion, swelling, ecchymosis and bony tenderness. He exhibits no effusion, no deformity, no laceration, no LCL laxity and no MCL laxity. Tenderness found. Medial joint line tenderness noted. No lateral joint line tenderness noted.       Legs: Neurological: He is alert and oriented to person, place, and time.  Skin: Skin is warm and dry. He  is not diaphoretic.  Psychiatric: He has a normal mood and affect. His behavior is normal.  Nursing note and vitals reviewed.    ED Treatments / Results  Labs (all labs ordered are listed, but only abnormal results are displayed) Labs Reviewed - No data to display  EKG None  Radiology Dg Knee Complete 4 Views Left  Result Date: 07/25/2018 CLINICAL DATA:  Fall this morning with left knee pain and swelling. EXAM: LEFT KNEE - COMPLETE 4+ VIEW COMPARISON:  None. FINDINGS: No acute fracture, dislocation or joint effusion identified. On the oblique view there appears to be soft tissue swelling versus hematoma of the distal medial thigh just above the knee joint. No soft tissue foreign body identified. IMPRESSION: No  acute bony injury or joint effusion identified. Focal soft tissue swelling versus hematoma of the distal medial thigh. Electronically Signed   By: Irish LackGlenn  Yamagata M.D.   On: 07/25/2018 09:48    Procedures Procedures (including critical care time)  Medications Ordered in ED Medications - No data to display   Initial Impression / Assessment and Plan / ED Course  I have reviewed the triage vital signs and the nursing notes.  Pertinent labs & imaging results that were available during my care of the patient were reviewed by me and considered in my medical decision making (see chart for details).  Clinical Course as of Jul 25 1013  Sun Jul 25, 2018  1013 36yo male with left knee injury today. Medial left knee swelling/bruising, no laxity. XR negative for fracture. Discussed results with patient, worsening medial knee swelling vs traumatic effusion. Patient placed in a knee immobilizer and given crutches/referral to ortho for further evaluation.     [LM]    Clinical Course User Index [LM] Jeannie FendMurphy, Kadence Mikkelson A, PA-C    Final Clinical Impressions(s) / ED Diagnoses   Final diagnoses:  Sprain of left knee, unspecified ligament, initial encounter    ED Discharge Orders    None         Jeannie FendMurphy, Sarann Tregre A, PA-C 07/25/18 1014    Cathren LaineSteinl, Kevin, MD 07/25/18 1059

## 2018-07-25 NOTE — Discharge Instructions (Signed)
Home to rest, apply ice for 20 minutes at a time, elevate leg. Take Tylenol as needed as directed for the first 48 hours for pain. After the first 48 hours, take Motrin and Tylenol as needed as directed for pain. Follow up with orthopedics, referral given.

## 2018-07-25 NOTE — ED Triage Notes (Signed)
Patient c/o left knee pain. Per patient dog pulled leg out from under him with his leash this morning. Patient states he hit the concrete and felt a tear in his knee. Swelling and bruising noted. Per patient burning sensation and hot to the touch.

## 2018-07-27 MED FILL — SUBOXONE 8 MG-2 MG SL FILM: 8-2 | 14 days supply | Qty: 42 | Fill #0

## 2018-08-10 MED FILL — SUBOXONE 8 MG-2 MG SL FILM: 8-2 | 14 days supply | Qty: 42 | Fill #0

## 2018-08-24 MED FILL — SUBOXONE 8 MG-2 MG SL FILM: 8-2 | 28 days supply | Qty: 84 | Fill #0

## 2018-09-19 ENCOUNTER — Emergency Department (HOSPITAL_COMMUNITY): Payer: Medicaid Other

## 2018-09-19 ENCOUNTER — Encounter (HOSPITAL_COMMUNITY): Payer: Self-pay | Admitting: Emergency Medicine

## 2018-09-19 ENCOUNTER — Emergency Department (HOSPITAL_COMMUNITY)
Admission: EM | Admit: 2018-09-19 | Discharge: 2018-09-19 | Disposition: A | Discharge status: Home / Self Care | Payer: Medicaid Other | Attending: Emergency Medicine | Admitting: Emergency Medicine

## 2018-09-19 DIAGNOSIS — R109 Unspecified abdominal pain: Secondary | ICD-10-CM

## 2018-09-19 DIAGNOSIS — F17210 Nicotine dependence, cigarettes, uncomplicated: Secondary | ICD-10-CM | POA: Insufficient documentation

## 2018-09-19 DIAGNOSIS — N029 Recurrent and persistent hematuria with unspecified morphologic changes: Secondary | ICD-10-CM | POA: Diagnosis not present

## 2018-09-19 DIAGNOSIS — R1084 Generalized abdominal pain: Secondary | ICD-10-CM | POA: Diagnosis not present

## 2018-09-19 DIAGNOSIS — Z79899 Other long term (current) drug therapy: Secondary | ICD-10-CM | POA: Insufficient documentation

## 2018-09-19 DIAGNOSIS — I10 Essential (primary) hypertension: Secondary | ICD-10-CM | POA: Insufficient documentation

## 2018-09-19 LAB — URINALYSIS, ROUTINE W REFLEX MICROSCOPIC
Bilirubin Urine: NEGATIVE
Glucose, UA: NEGATIVE mg/dL
Ketones, ur: NEGATIVE mg/dL
Nitrite: NEGATIVE
PH: 6 (ref 5.0–8.0)
PROTEIN: 30 mg/dL — AB
RBC / HPF: >50 RBC/hpf — ABNORMAL HIGH (ref 0–5)
Specific Gravity, Urine: 1.018 (ref 1.005–1.030)

## 2018-09-19 MED ORDER — CYCLOBENZAPRINE HCL 10 MG PO TABS: 10.0000 mg | ORAL_TABLET | Freq: Three times a day (TID) | ORAL | 0 refills | Status: DC

## 2018-09-19 MED ORDER — ONDANSETRON HCL 4 MG/2ML IJ SOLN
4.0000 mg | Freq: Once | INTRAMUSCULAR | Status: AC
  Administered 2018-09-19: 4 mg via INTRAVENOUS
  Filled 2018-09-19: qty 2

## 2018-09-19 MED ORDER — HYDROMORPHONE HCL 1 MG/ML IJ SOLN
0.5000 mg | Freq: Once | INTRAMUSCULAR | Status: AC
  Administered 2018-09-19: 0.5 mg via INTRAVENOUS
  Filled 2018-09-19: qty 1

## 2018-09-19 MED ORDER — ONDANSETRON HCL 4 MG PO TABS: 4.0000 mg | ORAL_TABLET | Freq: Four times a day (QID) | ORAL | 0 refills | Status: DC

## 2018-09-19 NOTE — Discharge Instructions (Signed)
Your CT scan is negative for kidney stone.  There was no evidence of any inflammation or problem with your appendix, and no noted problem with the vascular system that should cause your pain.  Please use Zofran for the nausea.  This could be related to a muscle strain or a GI bug.  Please use Flexeril 3 times daily.  Use Tylenol extra strength to assist with the discomfort.  Please see Dr. Freida Busman to complete the work-up of your discomfort.

## 2018-09-19 NOTE — ED Provider Notes (Addendum)
[facility] EMERGENCY DEPARTMENT Provider Note   CSN: [id number] Arrival date & time: 09/19/18  0747     History   Chief Complaint Chief Complaint  Patient presents with  . Flank Pain    HPI Alan Jennings is a 37 y.o. male.  The history is provided by the patient.  Flank Pain  This is a new problem. The current episode started more than 2 days ago. The problem occurs constantly. The problem has been gradually worsening. Associated symptoms include abdominal pain. Pertinent negatives include no chest pain and no shortness of breath. Nothing aggravates the symptoms. Nothing relieves the symptoms. He has tried nothing for the symptoms.    Past Medical History:  Diagnosis Date  . Anxiety   . Arthritis   . Back pain   . Complication of anesthesia    had a syncopal episode after rhinoplasty  . Depression   . GERD (gastroesophageal reflux disease)   . Headache   . History of kidney stones   . Hypertension   . PTSD (post-traumatic stress disorder)   . Seizures (HCC)   . Seizures [facility])     Patient Active Problem List   Diagnosis Date Noted  . Hematemesis without nausea 05/13/2018  . Abdominal pain, chronic, epigastric 05/13/2018  . Gastroesophageal reflux disease without esophagitis 05/13/2018  . Abdominal pain, epigastric 05/13/2018  . Muscle weakness (generalized) 09/22/2012  . Neurogenic pain 08/03/2012  . Hypokalemia 06/03/2012  . Left shoulder pain 06/03/2012  . Wound infection after surgery 06/03/2012  . Status post shoulder surgery 05/11/2012  . Shoulder injury 11/13/2011  . Contusion of arm, left 10/08/2011  . Fracture of humerus, proximal, left, closed 08/27/2011  . Fracture, humerus 06/11/2011  . CLOSED FRACTURE UNSPEC PART UPPER END HUMERUS 02/27/2011    Past Surgical History:  Procedure Laterality Date  . BIOPSY  06/25/2018   Procedure: BIOPSY;  Surgeon: Malissa Hippo, MD;  [facility];  Service: Endoscopy;;  gastric  .  ESOPHAGOGASTRODUODENOSCOPY (EGD) WITH PROPOFOL N/A 06/25/2018   Procedure: ESOPHAGOGASTRODUODENOSCOPY (EGD) WITH PROPOFOL;  Surgeon: Malissa Hippo, MD;  Location: [facility];  Service: Endoscopy;  Laterality: N/A;  . NOSE SURGERY     [facility]  . ORIF HUMERUS FRACTURE  04/26/2012   Procedure: OPEN REDUCTION INTERNAL FIXATION (ORIF) PROXIMAL HUMERUS FRACTURE;  Surgeon: Vickki Hearing, MD;  Location: [facility] ORS;  Service: Orthopedics;  Laterality: Left;  Open Reduction Internal Fixation of Left Proximal Humerus Fracture, Closing Wedge Osteotomy, Bone Graft  . RHINOPLASTY     [facility]  . SHOULDER SURGERY          Home Medications    Prior to Admission medications   Medication Sig Start Date End Date Taking? Authorizing Provider  diazepam (VALIUM) 5 MG tablet Take 1 tablet (5 mg total) by mouth 2 (two) times daily. 06/27/18   Burgess Amor, PA-C  pantoprazole (PROTONIX) 40 MG tablet Take 1 tablet (40 mg total) by mouth daily. One tab 30 minutes before breakfast and one tab 30 minutes before supper. Patient taking differently: Take 40 mg by mouth 2 (two) times daily before a meal. One tab 30 minutes before breakfast and one tab 30 minutes before supper. 05/12/18   Setzer, Brand Males, NP  SUBOXONE 8-2 MG FILM Take 1 Film by mouth 2 (two) times daily as needed (moderate to severe pain (plate in arm)).  06/01/18   [provider]  sucralfate (CARAFATE) 1 g tablet Take 1 tablet (1 g total)  by mouth 4 (four) times daily -  with meals and at bedtime. 06/25/18   Rehman, Joline Maxcy, MD  sucralfate (CARAFATE) 1 GM/10ML suspension Take 10 mLs (1 g total) by mouth 4 (four) times daily. 06/25/18 06/25/19  Malissa Hippo, MD  traZODone (DESYREL) 50 MG tablet Take 50 mg by mouth at bedtime.    [provider]  zolpidem (AMBIEN) 5 MG tablet Take 1 tablet by mouth at bedtime.  04/27/18   [provider]    Family History Family History  Problem Relation Age of Onset  . Heart disease Unknown   .  Arthritis Unknown   . Anesthesia problems Neg Hx   . Hypotension Neg Hx   . Malignant hyperthermia Neg Hx   . Pseudochol deficiency Neg Hx     Social History Social History   Tobacco Use  . Smoking status: Current Every Day Smoker    Packs/day: 0.50    Years: 7.00    Pack years: 3.50    Types: E-cigarettes  . Smokeless tobacco: Never Used  Substance Use Topics  . Alcohol use: No  . Drug use: Not Currently    Types: Marijuana     Allergies   Tramadol   Review of Systems Review of Systems  Constitutional: Negative for activity change and fever.       All ROS Neg except as noted in HPI  HENT: Negative for nosebleeds.   Eyes: Negative for photophobia and discharge.  Respiratory: Negative for cough, shortness of breath and wheezing.   Cardiovascular: Negative for chest pain and palpitations.  Gastrointestinal: Positive for abdominal pain, nausea and vomiting. Negative for blood in stool.  Genitourinary: Positive for flank pain. Negative for dysuria, frequency and hematuria.  Musculoskeletal: Negative for arthralgias, back pain and neck pain.  Skin: Negative.   Neurological: Negative for dizziness, seizures and speech difficulty.  Psychiatric/Behavioral: Negative for confusion and hallucinations.     Physical Exam Updated Vital Signs BP (!) 168/95 (BP Location: Right Arm)   Pulse (!) 115   Temp 97.8 F (36.6 C) (Oral)   Resp 16   Ht 5\' 8"  (1.727 m)   Wt 68 kg   SpO2 100%   BMI 22.81 kg/m   Physical Exam  Constitutional: He is oriented to person, place, and time. He appears well-developed and well-nourished.  Non-toxic appearance.  HENT:  Head: Normocephalic.  Right Ear: Tympanic membrane and external ear normal.  Left Ear: Tympanic membrane and external ear normal.  Eyes: Pupils are equal, round, and reactive to light. EOM and lids are normal.  Neck: Normal range of motion. Neck supple. Carotid bruit is not present.  Cardiovascular: Normal rate, regular  rhythm, normal heart sounds, intact distal pulses and normal pulses.  Pulmonary/Chest: Breath sounds normal. No respiratory distress.  Abdominal: Soft. Bowel sounds are normal. There is no tenderness. There is no guarding.  Right CVAT/flank pain.  Musculoskeletal: Normal range of motion.  Lymphadenopathy:       Head (right side): No submandibular adenopathy present.       Head (left side): No submandibular adenopathy present.    He has no cervical adenopathy.  Neurological: He is alert and oriented to person, place, and time. He has normal strength. No cranial nerve deficit or sensory deficit.  Skin: Skin is warm and dry.  Psychiatric: He has a normal mood and affect. His speech is normal.  Nursing note and vitals reviewed.    ED Treatments / Results  Labs (all labs  ordered are listed, but only abnormal results are displayed) Labs Reviewed - No data to display  EKG None  Radiology No results found.  Procedures Procedures (including critical care time)  Medications Ordered in ED Medications - No data to display   Initial Impression / Assessment and Plan / ED Course  I have reviewed the triage vital signs and the nursing notes.  Pertinent labs & imaging results that were available during my care of the patient were reviewed by me and considered in my medical decision making (see chart for details).       Final Clinical Impressions(s) / ED Diagnoses MDM  On admission to the emergency department the heart rate is elevated at 115, and the blood pressure is elevated at 168/95.  The pulse oximetry is within normal limits at 100% on room air.  The patient states that this feels similar to previous kidney stone.  He says his last kidney stone was probably about a year ago.  He says he has been having some blood in his urine since yesterday.  We will start an IV, give some medication for comfort, and obtain a CT stone study.  Urine analysis shows a large hemoglobin.  There is  a trace of leukocyte esterase.  There is greater than 50 red cells per high-powered field.  0-5 white blood cells. The CT scan is negative for urologic calculus or obstructive uropathy.  The remainder of the scan is negative with the exception of some fluid in the large bowel, possibly consistent with diarrhea.  I discussed the findings with the patient in terms of which he understands.  We discussed the possibility of this being a viral GI bug with some effect on the renal system.  We also discussed the possibility of musculoskeletal pain.  I have asked the patient to see Dr. Freida Busman, his primary physician to complete the work-up.  The patient is given a prescription for Zofran and Flexeril.  Have asked him to use Tylenol extra strength for discomfort.  He is invited to return to the emergency department immediately if any changes in his condition, problems, or concerns.   Final diagnoses:  Right flank pain  Idiopathic hematuria, unspecified whether glomerular morphologic changes present    ED Discharge Orders         Ordered    cyclobenzaprine (FLEXERIL) 10 MG tablet  3 times daily     09/19/18 0944    ondansetron (ZOFRAN) 4 MG tablet  Every 6 hours     09/19/18 0951           Ivery Quale, PA-C 09/19/18 1000    Ivery Quale, PA-C 09/19/18 1001    Terrilee Files, MD 09/19/18 [phone]

## 2018-09-21 MED FILL — SUBOXONE 8 MG-2 MG SL FILM: 8-2 | 28 days supply | Qty: 84 | Fill #0

## 2018-10-14 ENCOUNTER — Emergency Department (HOSPITAL_COMMUNITY): Payer: Medicaid Other

## 2018-10-14 ENCOUNTER — Other Ambulatory Visit: Payer: Self-pay

## 2018-10-14 ENCOUNTER — Encounter (HOSPITAL_COMMUNITY): Payer: Self-pay

## 2018-10-14 ENCOUNTER — Emergency Department (HOSPITAL_COMMUNITY)
Admission: EM | Admit: 2018-10-14 | Discharge: 2018-10-14 | Disposition: A | Discharge status: Home / Self Care | Payer: Medicaid Other | Attending: Emergency Medicine | Admitting: Emergency Medicine

## 2018-10-14 DIAGNOSIS — I10 Essential (primary) hypertension: Secondary | ICD-10-CM | POA: Diagnosis not present

## 2018-10-14 DIAGNOSIS — R569 Unspecified convulsions: Secondary | ICD-10-CM | POA: Insufficient documentation

## 2018-10-14 DIAGNOSIS — Z79899 Other long term (current) drug therapy: Secondary | ICD-10-CM | POA: Insufficient documentation

## 2018-10-14 DIAGNOSIS — Y9301 Activity, walking, marching and hiking: Secondary | ICD-10-CM | POA: Insufficient documentation

## 2018-10-14 DIAGNOSIS — Y92018 Other place in single-family (private) house as the place of occurrence of the external cause: Secondary | ICD-10-CM | POA: Diagnosis not present

## 2018-10-14 DIAGNOSIS — Y999 Unspecified external cause status: Secondary | ICD-10-CM | POA: Diagnosis not present

## 2018-10-14 DIAGNOSIS — W19XXXA Unspecified fall, initial encounter: Secondary | ICD-10-CM | POA: Diagnosis not present

## 2018-10-14 DIAGNOSIS — S0083XA Contusion of other part of head, initial encounter: Secondary | ICD-10-CM | POA: Diagnosis not present

## 2018-10-14 DIAGNOSIS — F17210 Nicotine dependence, cigarettes, uncomplicated: Secondary | ICD-10-CM | POA: Diagnosis not present

## 2018-10-14 DIAGNOSIS — S0990XA Unspecified injury of head, initial encounter: Secondary | ICD-10-CM | POA: Diagnosis present

## 2018-10-14 LAB — CBC WITH DIFFERENTIAL/PLATELET
ABS IMMATURE GRANULOCYTES: 0.04 10*3/uL (ref 0.00–0.07)
Basophils Absolute: 0 10*3/uL (ref 0.0–0.1)
Basophils Relative: 0 %
EOS PCT: 0 %
Eosinophils Absolute: 0 10*3/uL (ref 0.0–0.5)
HEMATOCRIT: 48.8 % (ref 39.0–52.0)
HEMOGLOBIN: 15.6 g/dL (ref 13.0–17.0)
Immature Granulocytes: 0 %
LYMPHS ABS: 1.3 10*3/uL (ref 0.7–4.0)
LYMPHS PCT: 13 %
MCH: 30.2 pg (ref 26.0–34.0)
MCHC: 32 g/dL (ref 30.0–36.0)
MCV: 94.4 fL (ref 80.0–100.0)
MONO ABS: 0.5 10*3/uL (ref 0.1–1.0)
Monocytes Relative: 5 %
NEUTROS ABS: 8 10*3/uL — AB (ref 1.7–7.7)
Neutrophils Relative %: 82 %
Platelets: 262 10*3/uL (ref 150–400)
RBC: 5.17 MIL/uL (ref 4.22–5.81)
RDW: 12.3 % (ref 11.5–15.5)
WBC: 9.8 10*3/uL (ref 4.0–10.5)
nRBC: 0 % (ref 0.0–0.2)

## 2018-10-14 LAB — URINALYSIS, ROUTINE W REFLEX MICROSCOPIC
Bilirubin Urine: NEGATIVE
GLUCOSE, UA: NEGATIVE mg/dL
Hgb urine dipstick: NEGATIVE
Ketones, ur: 20 mg/dL — AB
LEUKOCYTES UA: NEGATIVE
Nitrite: NEGATIVE
PH: 5 (ref 5.0–8.0)
Protein, ur: NEGATIVE mg/dL
Specific Gravity, Urine: 1.012 (ref 1.005–1.030)

## 2018-10-14 LAB — COMPREHENSIVE METABOLIC PANEL
ALBUMIN: 5.3 g/dL — AB (ref 3.5–5.0)
ALK PHOS: 79 U/L (ref 38–126)
ALT: 15 U/L (ref 0–44)
ANION GAP: 13 (ref 5–15)
AST: 18 U/L (ref 15–41)
BUN: 9 mg/dL (ref 6–20)
CALCIUM: 9.8 mg/dL (ref 8.9–10.3)
CO2: 22 mmol/L (ref 22–32)
Chloride: 103 mmol/L (ref 98–111)
Creatinine, Ser: 0.9 mg/dL (ref 0.61–1.24)
GFR calc Af Amer: >60 mL/min (ref >60)
GFR calc non Af Amer: >60 mL/min (ref >60)
GLUCOSE: 87 mg/dL (ref 70–99)
POTASSIUM: 4.1 mmol/L (ref 3.5–5.1)
SODIUM: 138 mmol/L (ref 135–145)
Total Bilirubin: 0.8 mg/dL (ref 0.3–1.2)
Total Protein: 8.8 g/dL — ABNORMAL HIGH (ref 6.5–8.1)

## 2018-10-14 LAB — RAPID URINE DRUG SCREEN, HOSP PERFORMED
Amphetamines: NOT DETECTED
BARBITURATES: NOT DETECTED
Benzodiazepines: POSITIVE — AB
COCAINE: NOT DETECTED
Opiates: NOT DETECTED
TETRAHYDROCANNABINOL: NOT DETECTED

## 2018-10-14 LAB — ETHANOL: Alcohol, Ethyl (B): <10 mg/dL (ref <10)

## 2018-10-14 MED ORDER — LEVETIRACETAM 500 MG PO TABS: 500.0000 mg | ORAL_TABLET | Freq: Two times a day (BID) | ORAL | 0 refills | Status: DC

## 2018-10-14 MED ORDER — ACETAMINOPHEN 500 MG PO TABS
1000.0000 mg | ORAL_TABLET | Freq: Once | ORAL | Status: AC
  Administered 2018-10-14: 1000 mg via ORAL
  Filled 2018-10-14: qty 2

## 2018-10-14 MED ORDER — SODIUM CHLORIDE 0.9 % IV BOLUS
1000.0000 mL | Freq: Once | INTRAVENOUS | Status: AC
  Administered 2018-10-14: 1000 mL via INTRAVENOUS

## 2018-10-14 MED ORDER — LEVOFLOXACIN 500 MG PO TABS
1000.0000 mg | ORAL_TABLET | Freq: Once | ORAL | Status: AC
  Administered 2018-10-14: 1000 mg via ORAL
  Filled 2018-10-14: qty 2

## 2018-10-14 NOTE — ED Provider Notes (Signed)
[facility] EMERGENCY DEPARTMENT Provider Note   CSN: [id number] Arrival date & time: 10/14/18  3244     History   Chief Complaint Chief Complaint  Patient presents with  . Seizures  . Neck Pain    HPI Alan Jennings is a 37 y.o. male.  HPI Patient presents after a suspected seizure this morning between the hours of 5 and 9:00 am while he was at home by himself.  States he believes he had a seizure 4 days ago as well.  Has a history of seizure disorder but is no longer taking Dilantin.  Last seizure before 1 4 days ago was several years ago.  Complains of headache, facial pain and neck pain.  Is unsure what he hit.  He did bite the inside of his lip. Past Medical History:  Diagnosis Date  . Anxiety   . Arthritis   . Back pain   . Complication of anesthesia    had a syncopal episode after rhinoplasty  . Depression   . GERD (gastroesophageal reflux disease)   . Headache   . History of kidney stones   . Hypertension   . PTSD (post-traumatic stress disorder)   . Seizures (HCC)   . Seizures [facility])     Patient Active Problem List   Diagnosis Date Noted  . Hematemesis without nausea 05/13/2018  . Abdominal pain, chronic, epigastric 05/13/2018  . Gastroesophageal reflux disease without esophagitis 05/13/2018  . Abdominal pain, epigastric 05/13/2018  . Muscle weakness (generalized) 09/22/2012  . Neurogenic pain 08/03/2012  . Hypokalemia 06/03/2012  . Left shoulder pain 06/03/2012  . Wound infection after surgery 06/03/2012  . Status post shoulder surgery 05/11/2012  . Shoulder injury 11/13/2011  . Contusion of arm, left 10/08/2011  . Fracture of humerus, proximal, left, closed 08/27/2011  . Fracture, humerus 06/11/2011  . CLOSED FRACTURE UNSPEC PART UPPER END HUMERUS 02/27/2011    Past Surgical History:  Procedure Laterality Date  . BIOPSY  06/25/2018   Procedure: BIOPSY;  Surgeon: Malissa Hippo, MD;  [facility]: [facility];  Service: Endoscopy;;  gastric  .  ESOPHAGOGASTRODUODENOSCOPY (EGD) WITH PROPOFOL N/A 06/25/2018   Procedure: ESOPHAGOGASTRODUODENOSCOPY (EGD) WITH PROPOFOL;  Surgeon: Malissa Hippo, MD;  Location: [facility] END[facility]ITE;  Service: Endoscopy;  Laterality: N/A;  . NOSE SURGERY     [facility]  . ORIF HUMERUS FRACTURE  04/26/2012   Procedure: OPEN REDUCTION INTERNAL FIXATION (ORIF) PROXIMAL HUMERUS FRACTURE;  Surgeon: Vickki Hearing, MD;  Location: [facility] ORS;  Service: Orthopedics;  Laterality: Left;  Open Reduction Internal Fixation of Left Proximal Humerus Fracture, Closing Wedge Osteotomy, Bone Graft  . RHINOPLASTY     [facility]  . SHOULDER SURGERY          Home Medications    Prior to Admission medications   Medication Sig Start Date End Date Taking? Authorizing Provider  acetaminophen (TYLENOL) 500 MG tablet Take 1,500 mg by mouth every 6 (six) hours as needed for moderate pain.   Yes [provider]  clonazePAM (KLONOPIN) 0.5 MG tablet Take 0.5 mg by mouth 2 (two) times daily as needed for anxiety.  09/20/18  Yes [provider]  pantoprazole (PROTONIX) 40 MG tablet Take 1 tablet (40 mg total) by mouth daily. One tab 30 minutes before breakfast and one tab 30 minutes before supper. Patient taking differently: Take 40 mg by mouth 2 (two) times daily before a meal. One tab 30 minutes before breakfast and one tab 30 minutes before supper. 05/12/18  Yes Setzer, Terri L, NP  SUBOXONE 8-2 MG FILM Take 1 Film by mouth 3 (three) times daily as needed (for pain.).  09/21/18  Yes [provider]  sucralfate (CARAFATE) 1 GM/10ML suspension Take 10 mLs (1 g total) by mouth 4 (four) times daily. 06/25/18 06/25/19 Yes Rehman, Joline Maxcy, MD  levETIRAcetam (KEPPRA) 500 MG tablet Take 1 tablet (500 mg total) by mouth 2 (two) times daily. 10/14/18   Loren Racer, MD    Family History Family History  Problem Relation Age of Onset  . Heart disease Unknown   . Arthritis Unknown   . Anesthesia problems Neg Hx   . Hypotension Neg Hx    . Malignant hyperthermia Neg Hx   . Pseudochol deficiency Neg Hx     Social History Social History   Tobacco Use  . Smoking status: Current Every Day Smoker    Packs/day: 0.50    Years: 7.00    Pack years: 3.50    Types: E-cigarettes  . Smokeless tobacco: Never Used  Substance Use Topics  . Alcohol use: No  . Drug use: Not Currently    Types: Marijuana     Allergies   Tramadol and Nsaids   Review of Systems Review of Systems  Constitutional: Negative for chills and fever.  HENT: Positive for facial swelling.   Eyes: Negative for visual disturbance.  Respiratory: Negative for cough and shortness of breath.   Cardiovascular: Negative for chest pain, palpitations and leg swelling.  Gastrointestinal: Negative for abdominal pain, diarrhea, nausea and vomiting.  Musculoskeletal: Positive for neck pain. Negative for back pain and neck stiffness.  Skin: Negative for rash.  Neurological: Positive for syncope and headaches. Negative for dizziness, weakness, light-headedness and numbness.  All other systems reviewed and are negative.    Physical Exam Updated Vital Signs BP 139/88   Pulse 100   Temp 98.6 F (37 C) (Oral)   Resp 11   Ht 5\' 8"  (1.727 m)   Wt 68 kg   SpO2 100%   BMI 22.81 kg/m   Physical Exam  Constitutional: He is oriented to person, place, and time. He appears well-developed and well-nourished. No distress.  HENT:  Head: Normocephalic.  Mouth/Throat: Oropharynx is clear and moist.  Hematoma to the right maxilla.  Tender to palpation.  No malocclusion.  No obvious intraoral trauma.  Eyes: Pupils are equal, round, and reactive to light. EOM are normal.  Neck:  Cervical collar in place  Cardiovascular: Normal rate and regular rhythm. Exam reveals no gallop and no friction rub.  No murmur heard. Pulmonary/Chest: Effort normal and breath sounds normal.  Abdominal: Soft. Bowel sounds are normal. There is no tenderness. There is no rebound and no  guarding.  Musculoskeletal: Normal range of motion. He exhibits no edema or tenderness.  Neurological: He is alert and oriented to person, place, and time.  Patient is alert and oriented x3 with clear, goal oriented speech. Patient has 5/5 motor in all extremities. Sensation is intact to light touch.  Skin: Skin is warm and dry. Capillary refill takes less than 2 seconds. No rash noted. He is not diaphoretic. No erythema.  Psychiatric: He has a normal mood and affect. His behavior is normal.  Nursing note and vitals reviewed.    ED Treatments / Results  Labs (all labs ordered are listed, but only abnormal results are displayed) Labs Reviewed  CBC WITH DIFFERENTIAL/PLATELET - Abnormal; Notable for the following components:      Result Value  Neutro Abs 8.0 (*)    All other components within normal limits  COMPREHENSIVE METABOLIC PANEL - Abnormal; Notable for the following components:   Total Protein 8.8 (*)    Albumin 5.3 (*)    All other components within normal limits  URINALYSIS, ROUTINE W REFLEX MICROSCOPIC - Abnormal; Notable for the following components:   Ketones, ur 20 (*)    All other components within normal limits  RAPID URINE DRUG SCREEN, HOSP PERFORMED - Abnormal; Notable for the following components:   Benzodiazepines POSITIVE (*)    All other components within normal limits  ETHANOL    EKG None  Radiology Ct Head Wo Contrast  Result Date: 10/14/2018 CLINICAL DATA:  Posttraumatic headache, facial and neck pain after seizure. EXAM: CT HEAD WITHOUT CONTRAST CT MAXILLOFACIAL WITHOUT CONTRAST CT CERVICAL SPINE WITHOUT CONTRAST TECHNIQUE: Multidetector CT imaging of the head, cervical spine, and maxillofacial structures were performed using the standard protocol without intravenous contrast. Multiplanar CT image reconstructions of the cervical spine and maxillofacial structures were also generated. COMPARISON:  CT scan of June 23, 2014. FINDINGS: CT HEAD FINDINGS Brain:  No evidence of acute infarction, hemorrhage, hydrocephalus, extra-axial collection or mass lesion/mass effect. Vascular: No hyperdense vessel or unexpected calcification. Skull: Normal. Negative for fracture or focal lesion. Other: None. CT MAXILLOFACIAL FINDINGS Osseous: No fracture or mandibular dislocation. No destructive process. Orbits: Negative. No traumatic or inflammatory finding. Sinuses: Clear. Soft tissues: Negative. CT CERVICAL SPINE FINDINGS Alignment: Normal. Skull base and vertebrae: No acute fracture. No primary bone lesion or focal pathologic process. Soft tissues and spinal canal: No prevertebral fluid or swelling. No visible canal hematoma. Disc levels:  Normal. Upper chest: Negative. Other: None. IMPRESSION: Normal head CT. No abnormality seen in maxillofacial region. Normal cervical spine. Electronically Signed   By: Lupita Raider, M.D.   On: 10/14/2018 12:05   Ct Cervical Spine Wo Contrast  Result Date: 10/14/2018 CLINICAL DATA:  Posttraumatic headache, facial and neck pain after seizure. EXAM: CT HEAD WITHOUT CONTRAST CT MAXILLOFACIAL WITHOUT CONTRAST CT CERVICAL SPINE WITHOUT CONTRAST TECHNIQUE: Multidetector CT imaging of the head, cervical spine, and maxillofacial structures were performed using the standard protocol without intravenous contrast. Multiplanar CT image reconstructions of the cervical spine and maxillofacial structures were also generated. COMPARISON:  CT scan of June 23, 2014. FINDINGS: CT HEAD FINDINGS Brain: No evidence of acute infarction, hemorrhage, hydrocephalus, extra-axial collection or mass lesion/mass effect. Vascular: No hyperdense vessel or unexpected calcification. Skull: Normal. Negative for fracture or focal lesion. Other: None. CT MAXILLOFACIAL FINDINGS Osseous: No fracture or mandibular dislocation. No destructive process. Orbits: Negative. No traumatic or inflammatory finding. Sinuses: Clear. Soft tissues: Negative. CT CERVICAL SPINE FINDINGS  Alignment: Normal. Skull base and vertebrae: No acute fracture. No primary bone lesion or focal pathologic process. Soft tissues and spinal canal: No prevertebral fluid or swelling. No visible canal hematoma. Disc levels:  Normal. Upper chest: Negative. Other: None. IMPRESSION: Normal head CT. No abnormality seen in maxillofacial region. Normal cervical spine. Electronically Signed   By: Lupita Raider, M.D.   On: 10/14/2018 12:05   Ct Maxillofacial Wo Contrast  Result Date: 10/14/2018 CLINICAL DATA:  Posttraumatic headache, facial and neck pain after seizure. EXAM: CT HEAD WITHOUT CONTRAST CT MAXILLOFACIAL WITHOUT CONTRAST CT CERVICAL SPINE WITHOUT CONTRAST TECHNIQUE: Multidetector CT imaging of the head, cervical spine, and maxillofacial structures were performed using the standard protocol without intravenous contrast. Multiplanar CT image reconstructions of the cervical spine and maxillofacial structures were also generated. COMPARISON:  CT scan of June 23, 2014. FINDINGS: CT HEAD FINDINGS Brain: No evidence of acute infarction, hemorrhage, hydrocephalus, extra-axial collection or mass lesion/mass effect. Vascular: No hyperdense vessel or unexpected calcification. Skull: Normal. Negative for fracture or focal lesion. Other: None. CT MAXILLOFACIAL FINDINGS Osseous: No fracture or mandibular dislocation. No destructive process. Orbits: Negative. No traumatic or inflammatory finding. Sinuses: Clear. Soft tissues: Negative. CT CERVICAL SPINE FINDINGS Alignment: Normal. Skull base and vertebrae: No acute fracture. No primary bone lesion or focal pathologic process. Soft tissues and spinal canal: No prevertebral fluid or swelling. No visible canal hematoma. Disc levels:  Normal. Upper chest: Negative. Other: None. IMPRESSION: Normal head CT. No abnormality seen in maxillofacial region. Normal cervical spine. Electronically Signed   By: Lupita Raider, M.D.   On: 10/14/2018 12:05    Procedures Procedures  (including critical care time)  Medications Ordered in ED Medications  sodium chloride 0.9 % bolus 1,000 mL (0 mLs Intravenous Stopped 10/14/18 1208)  levofloxacin (LEVAQUIN) tablet 1,000 mg (1,000 mg Oral Given 10/14/18 1232)  acetaminophen (TYLENOL) tablet 1,000 mg (1,000 mg Oral Given 10/14/18 1242)     Initial Impression / Assessment and Plan / ED Course  I have reviewed the triage vital signs and the nursing notes.  Pertinent labs & imaging results that were available during my care of the patient were reviewed by me and considered in my medical decision making (see chart for details).     Patient heart rate is under 100.  It quickly rises into the 120s as I talk with him regarding the results of his CT scans.  Believe this is likely due to anxiety.  No definite signs of withdrawal.  Patient denies alcohol use and states he only intermittently uses benzodiazepines.  Below the patient with Keppra and advised very close follow-up with Dr. Gerilyn Pilgrim.  Strict return precautions have been given.  Final Clinical Impressions(s) / ED Diagnoses   Final diagnoses:  Facial contusion, initial encounter  Seizures [facility])    ED Discharge Orders         Ordered    levETIRAcetam (KEPPRA) 500 MG tablet  2 times daily     10/14/18 1255           Loren Racer, MD 10/14/18 1256
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
## 2018-10-14 NOTE — ED Notes (Signed)
Cervical collar placed on pt.  

## 2018-10-19 MED FILL — NARCAN 4 MG NASAL SPRAY: 4 | 2 days supply | Qty: 2 | Fill #0

## 2018-10-19 MED FILL — SUBOXONE 8 MG-2 MG SL FILM: 8-2 | 14 days supply | Qty: 42 | Fill #0

## 2018-11-02 MED FILL — SUBOXONE 8 MG-2 MG SL FILM: 8-2 | 14 days supply | Qty: 42 | Fill #0

## 2018-11-16 MED FILL — SUBOXONE 8 MG-2 MG SL FILM: 8-2 | 14 days supply | Qty: 42 | Fill #0

## 2018-11-30 MED FILL — SUBOXONE 8 MG-2 MG SL FILM: 8-2 | 28 days supply | Qty: 84 | Fill #0

## 2018-12-22 ENCOUNTER — Encounter (HOSPITAL_COMMUNITY): Payer: Self-pay | Admitting: *Deleted

## 2018-12-22 ENCOUNTER — Emergency Department (HOSPITAL_COMMUNITY)
Admission: EM | Admit: 2018-12-22 | Discharge: 2018-12-22 | Disposition: A | Discharge status: Home / Self Care | Payer: Medicaid Other | Attending: Emergency Medicine | Admitting: Emergency Medicine

## 2018-12-22 ENCOUNTER — Other Ambulatory Visit: Payer: Self-pay

## 2018-12-22 DIAGNOSIS — I10 Essential (primary) hypertension: Secondary | ICD-10-CM | POA: Diagnosis not present

## 2018-12-22 DIAGNOSIS — R1013 Epigastric pain: Secondary | ICD-10-CM | POA: Diagnosis not present

## 2018-12-22 DIAGNOSIS — Z87891 Personal history of nicotine dependence: Secondary | ICD-10-CM | POA: Insufficient documentation

## 2018-12-22 DIAGNOSIS — E876 Hypokalemia: Secondary | ICD-10-CM | POA: Insufficient documentation

## 2018-12-22 DIAGNOSIS — Z79899 Other long term (current) drug therapy: Secondary | ICD-10-CM | POA: Insufficient documentation

## 2018-12-22 LAB — COMPREHENSIVE METABOLIC PANEL
ALBUMIN: 4.9 g/dL (ref 3.5–5.0)
ALT: 21 U/L (ref 0–44)
ANION GAP: 14 (ref 5–15)
AST: 24 U/L (ref 15–41)
Alkaline Phosphatase: 70 U/L (ref 38–126)
BILIRUBIN TOTAL: 0.7 mg/dL (ref 0.3–1.2)
BUN: 7 mg/dL (ref 6–20)
CHLORIDE: 103 mmol/L (ref 98–111)
CO2: 20 mmol/L — AB (ref 22–32)
Calcium: 9.3 mg/dL (ref 8.9–10.3)
Creatinine, Ser: 0.95 mg/dL (ref 0.61–1.24)
GFR calc Af Amer: >60 mL/min (ref >60)
GFR calc non Af Amer: >60 mL/min (ref >60)
Glucose, Bld: 98 mg/dL (ref 70–99)
POTASSIUM: 3.1 mmol/L — AB (ref 3.5–5.1)
SODIUM: 137 mmol/L (ref 135–145)
TOTAL PROTEIN: 8.4 g/dL — AB (ref 6.5–8.1)

## 2018-12-22 LAB — CBC WITH DIFFERENTIAL/PLATELET
ABS IMMATURE GRANULOCYTES: 0.02 10*3/uL (ref 0.00–0.07)
BASOS ABS: 0 10*3/uL (ref 0.0–0.1)
Basophils Relative: 0 %
EOS ABS: 0 10*3/uL (ref 0.0–0.5)
Eosinophils Relative: 0 %
HEMATOCRIT: 46.5 % (ref 39.0–52.0)
Hemoglobin: 15.3 g/dL (ref 13.0–17.0)
IMMATURE GRANULOCYTES: 0 %
Lymphocytes Relative: 23 %
Lymphs Abs: 2.2 10*3/uL (ref 0.7–4.0)
MCH: 30.5 pg (ref 26.0–34.0)
MCHC: 32.9 g/dL (ref 30.0–36.0)
MCV: 92.6 fL (ref 80.0–100.0)
Monocytes Absolute: 0.5 10*3/uL (ref 0.1–1.0)
Monocytes Relative: 6 %
NEUTROS ABS: 6.5 10*3/uL (ref 1.7–7.7)
NEUTROS PCT: 71 %
NRBC: 0 % (ref 0.0–0.2)
PLATELETS: 357 10*3/uL (ref 150–400)
RBC: 5.02 MIL/uL (ref 4.22–5.81)
RDW: 13 % (ref 11.5–15.5)
WBC: 9.2 10*3/uL (ref 4.0–10.5)

## 2018-12-22 LAB — URINALYSIS, ROUTINE W REFLEX MICROSCOPIC
Bilirubin Urine: NEGATIVE
Glucose, UA: NEGATIVE mg/dL
Hgb urine dipstick: NEGATIVE
Ketones, ur: 5 mg/dL — AB
LEUKOCYTES UA: NEGATIVE
NITRITE: NEGATIVE
PH: 6 (ref 5.0–8.0)
PROTEIN: NEGATIVE mg/dL
Specific Gravity, Urine: 1.021 (ref 1.005–1.030)

## 2018-12-22 LAB — LIPASE, BLOOD: Lipase: 47 U/L (ref 11–51)

## 2018-12-22 MED ORDER — PANTOPRAZOLE SODIUM 40 MG IV SOLR
40.0000 mg | Freq: Once | INTRAVENOUS | Status: AC
  Administered 2018-12-22: 40 mg via INTRAVENOUS
  Filled 2018-12-22: qty 40

## 2018-12-22 MED ORDER — ONDANSETRON HCL 4 MG/2ML IJ SOLN
4.0000 mg | Freq: Once | INTRAMUSCULAR | Status: AC
  Administered 2018-12-22: 4 mg via INTRAVENOUS
  Filled 2018-12-22: qty 2

## 2018-12-22 MED ORDER — SODIUM CHLORIDE 0.9 % IV BOLUS
1000.0000 mL | Freq: Once | INTRAVENOUS | Status: AC
  Administered 2018-12-22: 1000 mL via INTRAVENOUS

## 2018-12-22 MED ORDER — ONDANSETRON HCL 4 MG PO TABS: 4.0000 mg | ORAL_TABLET | Freq: Four times a day (QID) | ORAL | 0 refills | Status: DC | PRN

## 2018-12-22 MED ORDER — POTASSIUM CHLORIDE CRYS ER 20 MEQ PO TBCR: 20.0000 meq | EXTENDED_RELEASE_TABLET | Freq: Two times a day (BID) | ORAL | 0 refills | Status: DC

## 2018-12-22 MED ORDER — POTASSIUM CHLORIDE CRYS ER 20 MEQ PO TBCR
40.0000 meq | EXTENDED_RELEASE_TABLET | Freq: Once | ORAL | Status: AC
  Administered 2018-12-22: 40 meq via ORAL
  Filled 2018-12-22: qty 2

## 2018-12-22 MED ORDER — ALUM & MAG HYDROXIDE-SIMETH 200-200-20 MG/5ML PO SUSP
30.0000 mL | Freq: Once | ORAL | Status: AC
  Administered 2018-12-22: 30 mL via ORAL
  Filled 2018-12-22: qty 30

## 2018-12-22 MED ORDER — LIDOCAINE VISCOUS HCL 2 % MT SOLN
15.0000 mL | Freq: Once | OROMUCOSAL | Status: AC
  Administered 2018-12-22: 15 mL via ORAL
  Filled 2018-12-22: qty 15

## 2018-12-22 MED ORDER — OXYCODONE HCL 5 MG PO TABS: 5.0000 mg | ORAL_TABLET | ORAL | 0 refills | Status: DC | PRN

## 2018-12-22 MED ORDER — MORPHINE SULFATE (PF) 4 MG/ML IV SOLN
4.0000 mg | Freq: Once | INTRAVENOUS | Status: AC
  Administered 2018-12-22: 4 mg via INTRAVENOUS
  Filled 2018-12-22: qty 1

## 2018-12-22 MED ORDER — HYDROMORPHONE HCL 1 MG/ML IJ SOLN
1.0000 mg | Freq: Once | INTRAMUSCULAR | Status: AC
  Administered 2018-12-22: 1 mg via INTRAVENOUS
  Filled 2018-12-22: qty 1

## 2018-12-22 NOTE — ED Triage Notes (Signed)
Pt c/o abdominal pain and states he has been vomiting blood;

## 2018-12-22 NOTE — ED Provider Notes (Signed)
[facility] EMERGENCY DEPARTMENT Provider Note   CSN: [id number] Arrival date & time: 12/22/18  0109     History   Chief Complaint Chief Complaint  Patient presents with  . Abdominal Pain    HPI Alan GainsJoshua B Jennings is a 38 y.o. male.  The history is provided by the patient.  Abdominal Pain  He has history of hypertension, GERD, seizures who comes in complaining of abdominal pain for the last 2-3 days.  Pain is in the epigastric and periumbilical area with radiation to the back.  Pain is severe and he rates it at 10/10.  Is worse when he tries to eat.  There has been associated nausea and vomiting.  Today, there was some blood in which he vomited.  He had been diagnosed with gastritis from endoscopy last summer and was placed on pantoprazole.  He had been taking it, but has not been able to hold that down for the last 2 days.  When his stomach hurts, he does take Goody powder and Excedrin.  He denies constipation or diarrhea.  Of note, while in the waiting room, pain became severe and he had a syncopal episode.  He noted that he was feeling dizzy before passing out and there was some diaphoresis.  Past Medical History:  Diagnosis Date  . Anxiety   . Arthritis   . Back pain   . Complication of anesthesia    had a syncopal episode after rhinoplasty  . Depression   . GERD (gastroesophageal reflux disease)   . Headache   . History of kidney stones   . Hypertension   . PTSD (post-traumatic stress disorder)   . Seizures (HCC)   . Seizures [facility](HCC)     Patient Active Problem List   Diagnosis Date Noted  . Hematemesis without nausea 05/13/2018  . Abdominal pain, chronic, epigastric 05/13/2018  . Gastroesophageal reflux disease without esophagitis 05/13/2018  . Abdominal pain, epigastric 05/13/2018  . Muscle weakness (generalized) 09/22/2012  . Neurogenic pain 08/03/2012  . Hypokalemia 06/03/2012  . Left shoulder pain 06/03/2012  . Wound infection after surgery 06/03/2012  . Status  post shoulder surgery 05/11/2012  . Shoulder injury 11/13/2011  . Contusion of arm, left 10/08/2011  . Fracture of humerus, proximal, left, closed 08/27/2011  . Fracture, humerus 06/11/2011  . CLOSED FRACTURE UNSPEC PART UPPER END HUMERUS 02/27/2011    Past Surgical History:  Procedure Laterality Date  . BIOPSY  06/25/2018   Procedure: BIOPSY;  Surgeon: Malissa Hippoehman, Najeeb U, MD;  [facility];  Service: Endoscopy;;  gastric  . ESOPHAGOGASTRODUODENOSCOPY (EGD) WITH PROPOFOL N/A 06/25/2018   Procedure: ESOPHAGOGASTRODUODENOSCOPY (EGD) WITH PROPOFOL;  Surgeon: Malissa Hippoehman, Najeeb U, MD;  [facility];  Service: Endoscopy;  Laterality: N/A;  . NOSE SURGERY     [facility]  . ORIF HUMERUS FRACTURE  04/26/2012   Procedure: OPEN REDUCTION INTERNAL FIXATION (ORIF) PROXIMAL HUMERUS FRACTURE;  Surgeon: Vickki HearingStanley E Harrison, MD;  Location: [facility] ORS;  Service: Orthopedics;  Laterality: Left;  Open Reduction Internal Fixation of Left Proximal Humerus Fracture, Closing Wedge Osteotomy, Bone Graft  . RHINOPLASTY     [facility]  . SHOULDER SURGERY          Home Medications    Prior to Admission medications   Medication Sig Start Date End Date Taking? Authorizing Provider  acetaminophen (TYLENOL) 500 MG tablet Take 1,500 mg by mouth every 6 (six) hours as needed for moderate pain.    [provider]  clonazePAM Scarlette Calico(KLONOPIN) 0.5  MG tablet Take 0.5 mg by mouth 2 (two) times daily as needed for anxiety.  09/20/18   [provider]  levETIRAcetam (KEPPRA) 500 MG tablet Take 1 tablet (500 mg total) by mouth 2 (two) times daily. 10/14/18   Loren Racer, MD  pantoprazole (PROTONIX) 40 MG tablet Take 1 tablet (40 mg total) by mouth daily. One tab 30 minutes before breakfast and one tab 30 minutes before supper. Patient taking differently: Take 40 mg by mouth 2 (two) times daily before a meal. One tab 30 minutes before breakfast and one tab 30 minutes before supper. 05/12/18   Setzer, Terri L, NP    SUBOXONE 8-2 MG FILM Take 1 Film by mouth 3 (three) times daily as needed (for pain.).  09/21/18   [provider]  sucralfate (CARAFATE) 1 GM/10ML suspension Take 10 mLs (1 g total) by mouth 4 (four) times daily. 06/25/18 06/25/19  Malissa Hippo, MD    Family History Family History  Problem Relation Age of Onset  . Heart disease Other   . Arthritis Other   . Anesthesia problems Neg Hx   . Hypotension Neg Hx   . Malignant hyperthermia Neg Hx   . Pseudochol deficiency Neg Hx     Social History Social History   Tobacco Use  . Smoking status: Former Smoker    Packs/day: 0.50    Years: 7.00    Pack years: 3.50    Types: E-cigarettes  . Smokeless tobacco: Never Used  Substance Use Topics  . Alcohol use: No  . Drug use: Not Currently    Types: Marijuana     Allergies   Tramadol and Nsaids   Review of Systems Review of Systems  Gastrointestinal: Positive for abdominal pain.  All other systems reviewed and are negative.    Physical Exam Updated Vital Signs BP (!) 141/94 (BP Location: Left Arm)   Pulse (!) 102   Temp 98.8 F (37.1 C) (Oral)   Resp 20   Ht 5\' 8"  (1.727 m)   Wt 68.5 kg   SpO2 100%   BMI 22.96 kg/m   Physical Exam Vitals signs and nursing note reviewed.    38 year old male, resting comfortably and in no acute distress. Vital signs are significant for elevated blood pressure and borderline elevated heart rate. Oxygen saturation is 100%, which is normal. Head is normocephalic and atraumatic. PERRLA, EOMI. Oropharynx is clear. Neck is nontender and supple without adenopathy or JVD. Back is nontender and there is no CVA tenderness. Lungs are clear without rales, wheezes, or rhonchi. Chest is nontender. Heart has regular rate and rhythm without murmur. Abdomen is soft, flat, with mild to moderate epigastric tenderness.  There is no rebound or guarding.  There are no masses or hepatosplenomegaly and peristalsis is hypoactive. Extremities  have no cyanosis or edema, full range of motion is present. Skin is warm and dry without rash. Neurologic: Mental status is normal, cranial nerves are intact, there are no motor or sensory deficits.  ED Treatments / Results  Labs (all labs ordered are listed, but only abnormal results are displayed) Labs Reviewed  COMPREHENSIVE METABOLIC PANEL - Abnormal; Notable for the following components:      Result Value   Potassium 3.1 (*)    CO2 20 (*)    Total Protein 8.4 (*)    All other components within normal limits  URINALYSIS, ROUTINE W REFLEX MICROSCOPIC - Abnormal; Notable for the following components:   Ketones, ur 5 (*)  All other components within normal limits  LIPASE, BLOOD  CBC WITH DIFFERENTIAL/PLATELET    EKG EKG Interpretation  Date/Time:  Wednesday December 22 2018 01:53:12 EST Ventricular Rate:  89 PR Interval:    QRS Duration: 83 QT Interval:  336 QTC Calculation: 409 R Axis:   67 Text Interpretation:  Sinus rhythm Normal ECG When compared with ECG of 10/14/2018, No significant change was found Confirmed by Dione BoozeGlick, Keyon Winnick ([id number]) on 12/22/2018 1:58:34 AM  Procedures Procedures   Medications Ordered in ED Medications  sodium chloride 0.9 % bolus 1,000 mL (0 mLs Intravenous Stopped 12/22/18 0448)  morphine 4 MG/ML injection 4 mg (4 mg Intravenous Given 12/22/18 0327)  ondansetron (ZOFRAN) injection 4 mg (4 mg Intravenous Given 12/22/18 0327)  pantoprazole (PROTONIX) injection 40 mg (40 mg Intravenous Given 12/22/18 0327)  alum & mag hydroxide-simeth (MAALOX/MYLANTA) 200-200-20 MG/5ML suspension 30 mL (30 mLs Oral Given 12/22/18 0451)    And  lidocaine (XYLOCAINE) 2 % viscous mouth solution 15 mL (15 mLs Oral Given 12/22/18 0451)  HYDROmorphone (DILAUDID) injection 1 mg (1 mg Intravenous Given 12/22/18 0534)  potassium chloride SA (K-DUR,KLOR-CON) CR tablet 40 mEq (40 mEq Oral Given 12/22/18 [id number])     Initial Impression / Assessment and Plan / ED Course  I have reviewed the  triage vital signs and the nursing notes.  Pertinent lab results that were available during my care of the patient were reviewed by me and considered in my medical decision making (see chart for details).  Abdominal pain with physical finding strongly suggestive of ongoing gastritis and GERD.  No red flags to suggest more serious illness.  Syncopal episode appears to have been vasovagal.  EKG is normal.  Will check screening labs and give IV fluids, ondansetron, pantoprazole.  Patient will need to abstain from aspirin.  He continued to complain of abdominal pain.  Was given a GI cocktail and continued to complain of abdominal pain.  He was finally given a dose of hydromorphone with good relief of pain.  Prior to this, [place] website was reviewed, and he has been receiving Suboxone which patient states he has not been taking and states it was prescribed for shoulder pain following surgery.  At this point, still no indication of any process other than his underlying gastritis.  He is advised to increase his pantoprazole to twice a day, discharged with prescription for small number of oxycodone tablets.  He was noted to have hypokalemia and is given potassium in the ED and a prescription for a 5-day course of potassium.  Return precautions discussed.  Final Clinical Impressions(s) / ED Diagnoses   Final diagnoses:  Epigastric pain  Hypokalemia    ED Discharge Orders         Ordered    oxyCODONE (ROXICODONE) 5 MG immediate release tablet  Every 4 hours PRN     12/22/18 0624    ondansetron (ZOFRAN) 4 MG tablet  Every 6 hours PRN     12/22/18 0624    potassium chloride SA (K-DUR,KLOR-CON) 20 MEQ tablet  2 times daily     12/22/18 [id number]           Dione BoozeGlick, Mala Gibbard, MD 12/22/18 [phone]

## 2018-12-22 NOTE — Discharge Instructions (Addendum)
Increase your pantoprazole (Protonix) to twice a day.  Take ranitidine (Zantac) or famotidine (Pepcid AC) twice a day.  Return if symptoms are not being adequately controlled.

## 2018-12-25 ENCOUNTER — Other Ambulatory Visit: Payer: Self-pay

## 2018-12-25 ENCOUNTER — Encounter (HOSPITAL_COMMUNITY): Payer: Self-pay | Admitting: Emergency Medicine

## 2018-12-25 ENCOUNTER — Emergency Department (HOSPITAL_COMMUNITY)
Admission: EM | Admit: 2018-12-25 | Discharge: 2018-12-25 | Disposition: A | Discharge status: Home / Self Care | Payer: Medicaid Other | Attending: Emergency Medicine | Admitting: Emergency Medicine

## 2018-12-25 ENCOUNTER — Emergency Department (HOSPITAL_COMMUNITY): Payer: Medicaid Other

## 2018-12-25 DIAGNOSIS — R569 Unspecified convulsions: Secondary | ICD-10-CM | POA: Diagnosis not present

## 2018-12-25 DIAGNOSIS — R1010 Upper abdominal pain, unspecified: Secondary | ICD-10-CM | POA: Insufficient documentation

## 2018-12-25 DIAGNOSIS — Z87891 Personal history of nicotine dependence: Secondary | ICD-10-CM | POA: Diagnosis not present

## 2018-12-25 DIAGNOSIS — R1013 Epigastric pain: Secondary | ICD-10-CM | POA: Diagnosis present

## 2018-12-25 DIAGNOSIS — Z79899 Other long term (current) drug therapy: Secondary | ICD-10-CM | POA: Diagnosis not present

## 2018-12-25 LAB — COMPREHENSIVE METABOLIC PANEL
ALBUMIN: 4.5 g/dL (ref 3.5–5.0)
ALT: 18 U/L (ref 0–44)
ANION GAP: 13 (ref 5–15)
AST: 18 U/L (ref 15–41)
Alkaline Phosphatase: 68 U/L (ref 38–126)
BILIRUBIN TOTAL: 0.4 mg/dL (ref 0.3–1.2)
BUN: 8 mg/dL (ref 6–20)
CALCIUM: 9.1 mg/dL (ref 8.9–10.3)
CO2: 19 mmol/L — ABNORMAL LOW (ref 22–32)
CREATININE: 0.91 mg/dL (ref 0.61–1.24)
Chloride: 108 mmol/L (ref 98–111)
GFR calc Af Amer: >60 mL/min (ref >60)
GFR calc non Af Amer: >60 mL/min (ref >60)
GLUCOSE: 88 mg/dL (ref 70–99)
POTASSIUM: 3.2 mmol/L — AB (ref 3.5–5.1)
Sodium: 140 mmol/L (ref 135–145)
TOTAL PROTEIN: 7.9 g/dL (ref 6.5–8.1)

## 2018-12-25 LAB — CBC WITH DIFFERENTIAL/PLATELET
Abs Immature Granulocytes: 0.03 10*3/uL (ref 0.00–0.07)
BASOS ABS: 0 10*3/uL (ref 0.0–0.1)
BASOS PCT: 0 %
EOS PCT: 0 %
Eosinophils Absolute: 0 10*3/uL (ref 0.0–0.5)
HEMATOCRIT: 47 % (ref 39.0–52.0)
Hemoglobin: 15.8 g/dL (ref 13.0–17.0)
Immature Granulocytes: 0 %
Lymphocytes Relative: 20 %
Lymphs Abs: 1.8 10*3/uL (ref 0.7–4.0)
MCH: 31 pg (ref 26.0–34.0)
MCHC: 33.6 g/dL (ref 30.0–36.0)
MCV: 92.3 fL (ref 80.0–100.0)
Monocytes Absolute: 0.5 10*3/uL (ref 0.1–1.0)
Monocytes Relative: 5 %
NRBC: 0 % (ref 0.0–0.2)
Neutro Abs: 6.8 10*3/uL (ref 1.7–7.7)
Neutrophils Relative %: 75 %
Platelets: 346 10*3/uL (ref 150–400)
RBC: 5.09 MIL/uL (ref 4.22–5.81)
RDW: 12.9 % (ref 11.5–15.5)
WBC: 9.1 10*3/uL (ref 4.0–10.5)

## 2018-12-25 LAB — LIPASE, BLOOD: Lipase: 61 U/L — ABNORMAL HIGH (ref 11–51)

## 2018-12-25 MED ORDER — ONDANSETRON HCL 4 MG/2ML IJ SOLN
4.0000 mg | Freq: Once | INTRAMUSCULAR | Status: AC
  Administered 2018-12-25: 4 mg via INTRAVENOUS
  Filled 2018-12-25: qty 2

## 2018-12-25 MED ORDER — ONDANSETRON 4 MG PO TBDP: ORAL_TABLET | ORAL | 0 refills | Status: DC

## 2018-12-25 MED ORDER — HYDROCODONE-ACETAMINOPHEN 5-325 MG PO TABS: 1.0000 | ORAL_TABLET | Freq: Four times a day (QID) | ORAL | 0 refills | Status: DC | PRN

## 2018-12-25 MED ORDER — HYDROMORPHONE HCL 1 MG/ML IJ SOLN
1.0000 mg | Freq: Once | INTRAMUSCULAR | Status: AC
  Administered 2018-12-25: 1 mg via INTRAVENOUS
  Filled 2018-12-25: qty 1

## 2018-12-25 MED ORDER — IOPAMIDOL (ISOVUE-300) INJECTION 61%
100.0000 mL | Freq: Once | INTRAVENOUS | Status: AC | PRN
  Administered 2018-12-25: 100 mL via INTRAVENOUS

## 2018-12-25 MED ORDER — SODIUM CHLORIDE 0.9 % IV BOLUS
1000.0000 mL | Freq: Once | INTRAVENOUS | Status: AC
  Administered 2018-12-25: 1000 mL via INTRAVENOUS

## 2018-12-25 MED ORDER — HYDROMORPHONE HCL 1 MG/ML IJ SOLN
0.5000 mg | Freq: Once | INTRAMUSCULAR | Status: AC
  Administered 2018-12-25: 0.5 mg via INTRAVENOUS
  Filled 2018-12-25: qty 1

## 2018-12-25 MED ORDER — LORAZEPAM 2 MG/ML IJ SOLN
0.5000 mg | Freq: Once | INTRAMUSCULAR | Status: AC
  Administered 2018-12-25: 0.5 mg via INTRAVENOUS
  Filled 2018-12-25: qty 1

## 2018-12-25 MED ORDER — PANTOPRAZOLE SODIUM 40 MG IV SOLR
40.0000 mg | Freq: Once | INTRAVENOUS | Status: AC
  Administered 2018-12-25: 40 mg via INTRAVENOUS
  Filled 2018-12-25: qty 40

## 2018-12-25 NOTE — ED Notes (Signed)
Pt states he is in pain and feels like razor blades are in his stomach, edp notified

## 2018-12-25 NOTE — ED Provider Notes (Signed)
[facility] EMERGENCY DEPARTMENT Provider Note   CSN: [id number] Arrival date & time: 12/25/18  6578     History   Chief Complaint Chief Complaint  Patient presents with  . Abdominal Pain    HPI Alan Jennings is a 38 y.o. male.  Patient complains of epigastric abdominal pain patient has had a history of abdominal pain is on Protonix and Carafate.  The history is provided by the patient.  Abdominal Pain  Pain location:  Epigastric Pain quality: aching   Pain radiates to:  Does not radiate Pain severity:  Moderate Onset quality:  Sudden Duration:  3 days Timing:  Constant Progression:  Waxing and waning Chronicity:  Recurrent Context: not alcohol use   Relieved by:  Nothing Worsened by:  Nothing Ineffective treatments:  None tried Associated symptoms: no anorexia, no chest pain, no cough, no diarrhea, no fatigue and no hematuria   Risk factors: no alcohol abuse     Past Medical History:  Diagnosis Date  . Anxiety   . Arthritis   . Back pain   . Complication of anesthesia    had a syncopal episode after rhinoplasty  . Depression   . GERD (gastroesophageal reflux disease)   . Headache   . History of kidney stones   . Hypertension   . PTSD (post-traumatic stress disorder)   . Seizures (HCC)   . Seizures [facility])     Patient Active Problem List   Diagnosis Date Noted  . Hematemesis without nausea 05/13/2018  . Abdominal pain, chronic, epigastric 05/13/2018  . Gastroesophageal reflux disease without esophagitis 05/13/2018  . Abdominal pain, epigastric 05/13/2018  . Muscle weakness (generalized) 09/22/2012  . Neurogenic pain 08/03/2012  . Hypokalemia 06/03/2012  . Left shoulder pain 06/03/2012  . Wound infection after surgery 06/03/2012  . Status post shoulder surgery 05/11/2012  . Shoulder injury 11/13/2011  . Contusion of arm, left 10/08/2011  . Fracture of humerus, proximal, left, closed 08/27/2011  . Fracture, humerus 06/11/2011  . CLOSED FRACTURE  UNSPEC PART UPPER END HUMERUS 02/27/2011    Past Surgical History:  Procedure Laterality Date  . BIOPSY  06/25/2018   Procedure: BIOPSY;  Surgeon: Malissa Hippo, MD;  Location: [facility] ENDO SUITE;  Service: Endoscopy;;  gastric  . ESOPHAGOGASTRODUODENOSCOPY (EGD) WITH PROPOFOL N/A 06/25/2018   Procedure: ESOPHAGOGASTRODUODENOSCOPY (EGD) WITH PROPOFOL;  Surgeon: Malissa Hippo, MD;  Location: [facility] ENDO SUITE;  Service: Endoscopy;  Laterality: N/A;  . NOSE SURGERY     [facility]  . ORIF HUMERUS FRACTURE  04/26/2012   Procedure: OPEN REDUCTION INTERNAL FIXATION (ORIF) PROXIMAL HUMERUS FRACTURE;  Surgeon: Vickki Hearing, MD;  Location: [facility] ORS;  Service: Orthopedics;  Laterality: Left;  Open Reduction Internal Fixation of Left Proximal Humerus Fracture, Closing Wedge Osteotomy, Bone Graft  . RHINOPLASTY     [facility]  . SHOULDER SURGERY          Home Medications    Prior to Admission medications   Medication Sig Start Date End Date Taking? Authorizing Provider  acetaminophen (TYLENOL) 500 MG tablet Take 1,500 mg by mouth every 6 (six) hours as needed for moderate pain.   Yes [provider]  ondansetron (ZOFRAN) 4 MG tablet Take 1 tablet (4 mg total) by mouth every 6 (six) hours as needed. 12/22/18  Yes Dione Booze, MD  pantoprazole (PROTONIX) 40 MG tablet Take 1 tablet (40 mg total) by mouth daily. One tab 30 minutes before breakfast and one tab 30 minutes before supper. Patient taking  differently: Take 80 mg by mouth 2 (two) times daily before a meal. One tab 30 minutes before breakfast and one tab 30 minutes before supper. 05/12/18  Yes Setzer, Terri L, NP  sucralfate (CARAFATE) 1 GM/10ML suspension Take 10 mLs (1 g total) by mouth 4 (four) times daily. 06/25/18 06/25/19 Yes Rehman, Joline MaxcyNajeeb U, MD  HYDROcodone-acetaminophen (NORCO/VICODIN) 5-325 MG tablet Take 1 tablet by mouth every 6 (six) hours as needed for moderate pain. 12/25/18   Bethann BerkshireZammit, Khristian Seals, MD  ondansetron (ZOFRAN ODT) 4 MG  disintegrating tablet 4mg  ODT q4 hours prn nausea/vomit 12/25/18   Bethann BerkshireZammit, Keaten Mashek, MD    Family History Family History  Problem Relation Age of Onset  . Heart disease Other   . Arthritis Other   . Anesthesia problems Neg Hx   . Hypotension Neg Hx   . Malignant hyperthermia Neg Hx   . Pseudochol deficiency Neg Hx     Social History Social History   Tobacco Use  . Smoking status: Former Smoker    Packs/day: 0.50    Years: 7.00    Pack years: 3.50    Types: E-cigarettes  . Smokeless tobacco: Never Used  Substance Use Topics  . Alcohol use: No  . Drug use: Not Currently    Types: Marijuana     Allergies   Tramadol and Nsaids   Review of Systems Review of Systems  Constitutional: Negative for appetite change and fatigue.  HENT: Negative for congestion, ear discharge and sinus pressure.   Eyes: Negative for discharge.  Respiratory: Negative for cough.   Cardiovascular: Negative for chest pain.  Gastrointestinal: Positive for abdominal pain. Negative for anorexia and diarrhea.  Genitourinary: Negative for frequency and hematuria.  Musculoskeletal: Negative for back pain.  Skin: Negative for rash.  Neurological: Negative for seizures and headaches.  Psychiatric/Behavioral: Negative for hallucinations.     Physical Exam Updated Vital Signs BP 125/76   Pulse 73   Temp 98.6 F (37 C) (Oral)   Resp 18   Ht 5\' 8"  (1.727 m)   Wt 68 kg   SpO2 99%   BMI 22.79 kg/m   Physical Exam Vitals signs reviewed.  Constitutional:      Appearance: He is well-developed.  HENT:     Head: Normocephalic.     Mouth/Throat:     Mouth: Mucous membranes are moist.  Eyes:     General: No scleral icterus.    Conjunctiva/sclera: Conjunctivae normal.  Neck:     Musculoskeletal: Neck supple.     Thyroid: No thyromegaly.  Cardiovascular:     Rate and Rhythm: Normal rate and regular rhythm.     Heart sounds: No murmur. No friction rub. No gallop.   Pulmonary:     Breath sounds:  No stridor. No wheezing or rales.  Chest:     Chest wall: No tenderness.  Abdominal:     General: There is no distension.     Tenderness: There is abdominal tenderness. There is no rebound.  Musculoskeletal: Normal range of motion.  Lymphadenopathy:     Cervical: No cervical adenopathy.  Skin:    Findings: No erythema or rash.  Neurological:     Mental Status: He is oriented to person, place, and time.     Motor: No abnormal muscle tone.     Coordination: Coordination normal.  Psychiatric:        Behavior: Behavior normal.      ED Treatments / Results  Labs (all labs ordered are listed, but only  abnormal results are displayed) Labs Reviewed  COMPREHENSIVE METABOLIC PANEL - Abnormal; Notable for the following components:      Result Value   Potassium 3.2 (*)    CO2 19 (*)    All other components within normal limits  LIPASE, BLOOD - Abnormal; Notable for the following components:   Lipase 61 (*)    All other components within normal limits  CBC WITH DIFFERENTIAL/PLATELET    EKG None  Radiology Ct Abdomen Pelvis W Contrast  Result Date: 12/25/2018 CLINICAL DATA:  Abdominal pain, nausea, vomiting x 5 days. No hx of abdominal surgery/ EXAM: CT ABDOMEN AND PELVIS WITH CONTRAST TECHNIQUE: Multidetector CT imaging of the abdomen and pelvis was performed using the standard protocol following bolus administration of intravenous contrast. CONTRAST:  ISOVUE-300 IOPAMIDOL (ISOVUE-300) INJECTION 61% COMPARISON:  09/19/2018 FINDINGS: Lower chest: No acute abnormality. Hepatobiliary: No focal liver lesion. No biliary ductal dilatation. Gallbladder decompressed. Pancreas: Unremarkable. No pancreatic ductal dilatation or surrounding inflammatory changes. Spleen: Normal in size without focal abnormality. Adrenals/Urinary Tract: Normal adrenals. No renal mass or hydronephrosis. Urinary bladder incompletely distended. Stomach/Bowel: The stomach is incompletely distended. Gas and fluid  distends the proximal duodenum, which is decompressed distally. Small bowel is nondilated. Appendix not discretely identified. No pericecal inflammatory/edematous change. The colon is nondilated, unremarkable. Vascular/Lymphatic: No significant vascular findings are present. No enlarged abdominal or pelvic lymph nodes. Reproductive: Prostate is unremarkable. Other: No ascites. No free air. Musculoskeletal: No acute or significant osseous findings. IMPRESSION: No acute findings. Electronically Signed   By: Corlis Leak M.D.   On: 12/25/2018 09:58    Procedures Procedures (including critical care time)  Medications Ordered in ED Medications  HYDROmorphone (DILAUDID) injection 0.5 mg (has no administration in time range)  sodium chloride 0.9 % bolus 1,000 mL (0 mLs Intravenous Stopped 12/25/18 1043)  pantoprazole (PROTONIX) injection 40 mg (40 mg Intravenous Given 12/25/18 0845)  ondansetron (ZOFRAN) injection 4 mg (4 mg Intravenous Given 12/25/18 0845)  HYDROmorphone (DILAUDID) injection 1 mg (1 mg Intravenous Given 12/25/18 0846)  iopamidol (ISOVUE-300) 61 % injection 100 mL (100 mLs Intravenous Contrast Given 12/25/18 0916)  HYDROmorphone (DILAUDID) injection 1 mg (1 mg Intravenous Given 12/25/18 1047)  LORazepam (ATIVAN) injection 0.5 mg (0.5 mg Intravenous Given 12/25/18 1047)     Initial Impression / Assessment and Plan / ED Course  I have reviewed the triage vital signs and the nursing notes.  Pertinent labs & imaging results that were available during my care of the patient were reviewed by me and considered in my medical decision making (see chart for details).     Patient with continued abdominal pain.  Labs unremarkable except for mild elevation of lipase.  CT scan unremarkable.  I spoke with his GI doctor, Dr. Karilyn Cota and he stated to arrange for an outpatient ultrasound and they will follow-up with the patient.  The patient will continue the Protonix and the Carafate and he is given some  Vicodin and Zofran  Final Clinical Impressions(s) / ED Diagnoses   Final diagnoses:  Pain of upper abdomen    ED Discharge Orders         Ordered    US Abdomen Complete    Comments:  Send results to dr. Kelvin Cellar   12/25/18 1318    HYDROcodone-acetaminophen (NORCO/VICODIN) 5-325 MG tablet  Every 6 hours PRN,   Status:  Discontinued     12/25/18 1318    ondansetron (ZOFRAN ODT) 4 MG disintegrating tablet     12/25/18  1318    HYDROcodone-acetaminophen (NORCO/VICODIN) 5-325 MG tablet  Every 6 hours PRN     12/25/18 1319           Bethann BerkshireZammit, Nancey Kreitz, MD 12/25/18 1325

## 2018-12-25 NOTE — ED Triage Notes (Signed)
Pt states he has been having abd pain with vomiting x 5 days.

## 2018-12-25 NOTE — Discharge Instructions (Signed)
Follow-up this week to get your ultrasound done.  Dr. Dionicia Abler should call you after you get your ultrasound done.  If you do not hear from their office this week then give them a call

## 2018-12-27 ENCOUNTER — Observation Stay (HOSPITAL_COMMUNITY)
Admission: EM | Admit: 2018-12-27 | Discharge: 2018-12-28 | Disposition: A | Discharge status: Home / Self Care | Payer: Medicaid Other | Attending: Internal Medicine | Admitting: Internal Medicine

## 2018-12-27 ENCOUNTER — Encounter (HOSPITAL_COMMUNITY): Payer: Self-pay | Admitting: Emergency Medicine

## 2018-12-27 ENCOUNTER — Other Ambulatory Visit: Payer: Self-pay

## 2018-12-27 ENCOUNTER — Emergency Department (HOSPITAL_COMMUNITY): Payer: Medicaid Other

## 2018-12-27 DIAGNOSIS — I10 Essential (primary) hypertension: Secondary | ICD-10-CM | POA: Insufficient documentation

## 2018-12-27 DIAGNOSIS — Z79899 Other long term (current) drug therapy: Secondary | ICD-10-CM | POA: Diagnosis not present

## 2018-12-27 DIAGNOSIS — K859 Acute pancreatitis without necrosis or infection, unspecified: Secondary | ICD-10-CM | POA: Diagnosis not present

## 2018-12-27 DIAGNOSIS — E876 Hypokalemia: Secondary | ICD-10-CM | POA: Diagnosis not present

## 2018-12-27 DIAGNOSIS — K2900 Acute gastritis without bleeding: Secondary | ICD-10-CM

## 2018-12-27 DIAGNOSIS — Z87891 Personal history of nicotine dependence: Secondary | ICD-10-CM | POA: Insufficient documentation

## 2018-12-27 DIAGNOSIS — R109 Unspecified abdominal pain: Secondary | ICD-10-CM

## 2018-12-27 DIAGNOSIS — R1013 Epigastric pain: Secondary | ICD-10-CM | POA: Diagnosis present

## 2018-12-27 LAB — CBC WITH DIFFERENTIAL/PLATELET
Abs Immature Granulocytes: 0.03 10*3/uL (ref 0.00–0.07)
Basophils Absolute: 0.1 10*3/uL (ref 0.0–0.1)
Basophils Relative: 1 %
EOS ABS: 0 10*3/uL (ref 0.0–0.5)
Eosinophils Relative: 0 %
HCT: 40.4 % (ref 39.0–52.0)
Hemoglobin: 13.5 g/dL (ref 13.0–17.0)
Immature Granulocytes: 0 %
LYMPHS ABS: 1.8 10*3/uL (ref 0.7–4.0)
Lymphocytes Relative: 17 %
MCH: 31 pg (ref 26.0–34.0)
MCHC: 33.4 g/dL (ref 30.0–36.0)
MCV: 92.9 fL (ref 80.0–100.0)
Monocytes Absolute: 0.4 10*3/uL (ref 0.1–1.0)
Monocytes Relative: 4 %
Neutro Abs: 8.3 10*3/uL — ABNORMAL HIGH (ref 1.7–7.7)
Neutrophils Relative %: 78 %
Platelets: 305 10*3/uL (ref 150–400)
RBC: 4.35 MIL/uL (ref 4.22–5.81)
RDW: 13 % (ref 11.5–15.5)
WBC: 10.5 10*3/uL (ref 4.0–10.5)
nRBC: 0 % (ref 0.0–0.2)

## 2018-12-27 LAB — TROPONIN I: Troponin I: <0.03 ng/mL (ref <0.03)

## 2018-12-27 LAB — COMPREHENSIVE METABOLIC PANEL
ALT: 14 U/L (ref 0–44)
ANION GAP: 10 (ref 5–15)
AST: 16 U/L (ref 15–41)
Albumin: 4.1 g/dL (ref 3.5–5.0)
Alkaline Phosphatase: 60 U/L (ref 38–126)
BILIRUBIN TOTAL: 0.4 mg/dL (ref 0.3–1.2)
BUN: 10 mg/dL (ref 6–20)
CO2: 24 mmol/L (ref 22–32)
Calcium: 9.1 mg/dL (ref 8.9–10.3)
Chloride: 106 mmol/L (ref 98–111)
Creatinine, Ser: 0.89 mg/dL (ref 0.61–1.24)
GFR calc Af Amer: >60 mL/min (ref >60)
GFR calc non Af Amer: >60 mL/min (ref >60)
Glucose, Bld: 88 mg/dL (ref 70–99)
Potassium: 3 mmol/L — ABNORMAL LOW (ref 3.5–5.1)
Sodium: 140 mmol/L (ref 135–145)
Total Protein: 7.1 g/dL (ref 6.5–8.1)

## 2018-12-27 LAB — URINALYSIS, ROUTINE W REFLEX MICROSCOPIC
Bilirubin Urine: NEGATIVE
Glucose, UA: NEGATIVE mg/dL
Hgb urine dipstick: NEGATIVE
Ketones, ur: NEGATIVE mg/dL
LEUKOCYTES UA: NEGATIVE
Nitrite: NEGATIVE
Protein, ur: NEGATIVE mg/dL
Specific Gravity, Urine: 1.003 — ABNORMAL LOW (ref 1.005–1.030)
pH: 7 (ref 5.0–8.0)

## 2018-12-27 LAB — RAPID URINE DRUG SCREEN, HOSP PERFORMED
Amphetamines: NOT DETECTED
Barbiturates: NOT DETECTED
Benzodiazepines: POSITIVE — AB
Cocaine: NOT DETECTED
Opiates: POSITIVE — AB
Tetrahydrocannabinol: NOT DETECTED

## 2018-12-27 LAB — MAGNESIUM: Magnesium: 2.1 mg/dL (ref 1.7–2.4)

## 2018-12-27 LAB — LIPASE, BLOOD: Lipase: 56 U/L — ABNORMAL HIGH (ref 11–51)

## 2018-12-27 MED ORDER — NICOTINE 21 MG/24HR TD PT24
21.0000 mg | MEDICATED_PATCH | Freq: Every day | TRANSDERMAL | Status: DC
  Administered 2018-12-27: 21 mg via TRANSDERMAL
  Filled 2018-12-27: qty 1

## 2018-12-27 MED ORDER — ONDANSETRON HCL 4 MG/2ML IJ SOLN
4.0000 mg | Freq: Once | INTRAMUSCULAR | Status: AC
  Administered 2018-12-27: 4 mg via INTRAVENOUS
  Filled 2018-12-27: qty 2

## 2018-12-27 MED ORDER — PROCHLORPERAZINE EDISYLATE 10 MG/2ML IJ SOLN
10.0000 mg | INTRAMUSCULAR | Status: DC | PRN
  Administered 2018-12-27: 10 mg via INTRAVENOUS
  Filled 2018-12-27: qty 2

## 2018-12-27 MED ORDER — HYDROMORPHONE HCL 1 MG/ML IJ SOLN
1.0000 mg | INTRAMUSCULAR | Status: DC | PRN
  Administered 2018-12-27 – 2018-12-28 (×6): 2 mg via INTRAVENOUS
  Filled 2018-12-27 (×6): qty 2

## 2018-12-27 MED ORDER — IOPAMIDOL (ISOVUE-300) INJECTION 61%
100.0000 mL | Freq: Once | INTRAVENOUS | Status: AC | PRN
  Administered 2018-12-27: 100 mL via INTRAVENOUS

## 2018-12-27 MED ORDER — POTASSIUM CHLORIDE CRYS ER 20 MEQ PO TBCR
40.0000 meq | EXTENDED_RELEASE_TABLET | Freq: Once | ORAL | Status: AC
  Administered 2018-12-27: 40 meq via ORAL
  Filled 2018-12-27: qty 2

## 2018-12-27 MED ORDER — POTASSIUM CHLORIDE IN NACL 40-0.9 MEQ/L-% IV SOLN
INTRAVENOUS | Status: DC
  Administered 2018-12-27 – 2018-12-28 (×3): 100 mL/h via INTRAVENOUS
  Filled 2018-12-27 (×4): qty 1000

## 2018-12-27 MED ORDER — HYDROMORPHONE HCL 1 MG/ML IJ SOLN
1.0000 mg | Freq: Once | INTRAMUSCULAR | Status: AC
  Administered 2018-12-27: 1 mg via INTRAVENOUS
  Filled 2018-12-27: qty 1

## 2018-12-27 MED ORDER — PANTOPRAZOLE SODIUM 40 MG PO TBEC
40.0000 mg | DELAYED_RELEASE_TABLET | Freq: Two times a day (BID) | ORAL | Status: DC
  Administered 2018-12-27 (×2): 40 mg via ORAL
  Filled 2018-12-27 (×2): qty 1

## 2018-12-27 MED ORDER — FAMOTIDINE 20 MG PO TABS
40.0000 mg | ORAL_TABLET | Freq: Every day | ORAL | Status: DC
  Administered 2018-12-27: 40 mg via ORAL
  Filled 2018-12-27: qty 2

## 2018-12-27 MED ORDER — HEPARIN SODIUM (PORCINE) 5000 UNIT/ML IJ SOLN
5000.0000 [IU] | Freq: Three times a day (TID) | INTRAMUSCULAR | Status: DC
  Administered 2018-12-27 – 2018-12-28 (×3): 5000 [IU] via SUBCUTANEOUS
  Filled 2018-12-27 (×3): qty 1

## 2018-12-27 MED ORDER — SUCRALFATE 1 GM/10ML PO SUSP
1.0000 g | Freq: Three times a day (TID) | ORAL | Status: DC
  Administered 2018-12-27 (×3): 1 g via ORAL
  Filled 2018-12-27 (×3): qty 10

## 2018-12-27 MED ORDER — PROMETHAZINE HCL 25 MG/ML IJ SOLN
INTRAMUSCULAR | Status: AC
  Filled 2018-12-27: qty 1

## 2018-12-27 MED ORDER — SODIUM CHLORIDE 0.9 % IV BOLUS
1000.0000 mL | Freq: Once | INTRAVENOUS | Status: AC
  Administered 2018-12-27: 1000 mL via INTRAVENOUS

## 2018-12-27 MED ORDER — SODIUM CHLORIDE 0.9 % IV SOLN: INTRAVENOUS | Status: DC

## 2018-12-27 MED ORDER — PANTOPRAZOLE SODIUM 40 MG IV SOLR
40.0000 mg | Freq: Once | INTRAVENOUS | Status: AC
  Administered 2018-12-27: 40 mg via INTRAVENOUS
  Filled 2018-12-27: qty 40

## 2018-12-27 MED ORDER — PROMETHAZINE HCL 25 MG/ML IJ SOLN
25.0000 mg | Freq: Once | INTRAMUSCULAR | Status: AC
  Administered 2018-12-27: 25 mg via INTRAVENOUS

## 2018-12-27 NOTE — ED Triage Notes (Signed)
Pt c/o abd pain that radiates thru his back x 4-5 hr, pt reports he "passed out" 2 times tonight

## 2018-12-27 NOTE — ED Provider Notes (Signed)
[facility] EMERGENCY DEPARTMENT Provider Note   CSN: [id number] Arrival date & time: 12/27/18  [id number]     History   Chief Complaint Chief Complaint  Patient presents with  . Abdominal Pain    HPI Bonna GainsJoshua B Routon is a 38 y.o. male.  Patient with history of reflux disease and gastritis, hypertension and PTSD presenting with epigastric pain with nausea.  This is his third visit for this pain since January 8.  Reports he was diagnosed with gastritis and duodenitis and has been on PPI and Carafate.  He states compliance with his medications and doubled up on his Carafate dose as instructed.  He states the pain became severe again around 11 PM last night and was not responding to the hydrocodone he took at home.  He has had nausea but no vomiting.  No blood in his emesis or blood in his stool.  States he is not used any aspirin or Goody powders.  No caffeine or alcohol.  Reports he had a syncopal episode because the pain was severe but did not hit his head.  No chest pain or shortness of breath.  No focal weakness, numbness or tingling.  States he was supposed to schedule an ultrasound for later this morning. Reports he has had this pain intermittently in the past but has become more severe over the past 5 days.  The history is provided by the patient.  Abdominal Pain  Associated symptoms: nausea   Associated symptoms: no chest pain, no diarrhea, no dysuria, no hematuria, no shortness of breath and no vomiting     Past Medical History:  Diagnosis Date  . Anxiety   . Arthritis   . Back pain   . Complication of anesthesia    had a syncopal episode after rhinoplasty  . Depression   . GERD (gastroesophageal reflux disease)   . Headache   . History of kidney stones   . Hypertension   . PTSD (post-traumatic stress disorder)   . Seizures (HCC)   . Seizures [facility](HCC)     Patient Active Problem List   Diagnosis Date Noted  . Hematemesis without nausea 05/13/2018  . Abdominal pain, chronic,  epigastric 05/13/2018  . Gastroesophageal reflux disease without esophagitis 05/13/2018  . Abdominal pain, epigastric 05/13/2018  . Muscle weakness (generalized) 09/22/2012  . Neurogenic pain 08/03/2012  . Hypokalemia 06/03/2012  . Left shoulder pain 06/03/2012  . Wound infection after surgery 06/03/2012  . Status post shoulder surgery 05/11/2012  . Shoulder injury 11/13/2011  . Contusion of arm, left 10/08/2011  . Fracture of humerus, proximal, left, closed 08/27/2011  . Fracture, humerus 06/11/2011  . CLOSED FRACTURE UNSPEC PART UPPER END HUMERUS 02/27/2011    Past Surgical History:  Procedure Laterality Date  . BIOPSY  06/25/2018   Procedure: BIOPSY;  Surgeon: Malissa Hippoehman, Najeeb U, MD;  Location: [facility];  Service: Endoscopy;;  gastric  . ESOPHAGOGASTRODUODENOSCOPY (EGD) WITH PROPOFOL N/A 06/25/2018   Procedure: ESOPHAGOGASTRODUODENOSCOPY (EGD) WITH PROPOFOL;  Surgeon: Malissa Hippoehman, Najeeb U, MD;  Location: [facility] ENDO SUITE;  Service: Endoscopy;  Laterality: N/A;  . NOSE SURGERY     [facility]  . ORIF HUMERUS FRACTURE  04/26/2012   Procedure: OPEN REDUCTION INTERNAL FIXATION (ORIF) PROXIMAL HUMERUS FRACTURE;  Surgeon: Vickki HearingStanley E Harrison, MD;  Location: [facility] ORS;  Service: Orthopedics;  Laterality: Left;  Open Reduction Internal Fixation of Left Proximal Humerus Fracture, Closing Wedge Osteotomy, Bone Graft  . RHINOPLASTY     [facility]  . SHOULDER SURGERY  Home Medications    Prior to Admission medications   Medication Sig Start Date End Date Taking? Authorizing Provider  acetaminophen (TYLENOL) 500 MG tablet Take 1,500 mg by mouth every 6 (six) hours as needed for moderate pain.    [provider]  HYDROcodone-acetaminophen (NORCO/VICODIN) 5-325 MG tablet Take 1 tablet by mouth every 6 (six) hours as needed for moderate pain. 12/25/18   Bethann BerkshireZammit, Joseph, MD  ondansetron (ZOFRAN ODT) 4 MG disintegrating tablet 4mg  ODT q4 hours prn nausea/vomit 12/25/18   Bethann BerkshireZammit, Joseph, MD  ondansetron  (ZOFRAN) 4 MG tablet Take 1 tablet (4 mg total) by mouth every 6 (six) hours as needed. 12/22/18   Dione BoozeGlick, David, MD  pantoprazole (PROTONIX) 40 MG tablet Take 1 tablet (40 mg total) by mouth daily. One tab 30 minutes before breakfast and one tab 30 minutes before supper. Patient taking differently: Take 80 mg by mouth 2 (two) times daily before a meal. One tab 30 minutes before breakfast and one tab 30 minutes before supper. 05/12/18   Setzer, Brand Maleserri L, NP  sucralfate (CARAFATE) 1 GM/10ML suspension Take 10 mLs (1 g total) by mouth 4 (four) times daily. 06/25/18 06/25/19  Malissa Hippoehman, Najeeb U, MD    Family History Family History  Problem Relation Age of Onset  . Heart disease Other   . Arthritis Other   . Anesthesia problems Neg Hx   . Hypotension Neg Hx   . Malignant hyperthermia Neg Hx   . Pseudochol deficiency Neg Hx     Social History Social History   Tobacco Use  . Smoking status: Former Smoker    Packs/day: 0.50    Years: 7.00    Pack years: 3.50    Types: E-cigarettes  . Smokeless tobacco: Never Used  Substance Use Topics  . Alcohol use: No  . Drug use: Not Currently    Types: Marijuana     Allergies   Tramadol and Nsaids   Review of Systems Review of Systems  Constitutional: Positive for activity change and appetite change.  Eyes: Negative for visual disturbance.  Respiratory: Negative for chest tightness and shortness of breath.   Cardiovascular: Negative for chest pain.  Gastrointestinal: Positive for abdominal pain and nausea. Negative for blood in stool, diarrhea and vomiting.  Genitourinary: Negative for dysuria and hematuria.  Musculoskeletal: Negative for back pain.  Neurological: Negative for dizziness, weakness and headaches.   all other systems are negative except as noted in the HPI and PMH.     Physical Exam Updated Vital Signs BP (!) 141/93   Pulse 69   Temp 97.7 F (36.5 C) (Oral)   Resp 11   Ht 5\' 8"  (1.727 m)   Wt 68 kg   SpO2 100%   BMI  22.79 kg/m   Physical Exam Vitals signs and nursing note reviewed.  Constitutional:      General: He is not in acute distress.    Appearance: He is well-developed.  HENT:     Head: Normocephalic and atraumatic.     Mouth/Throat:     Pharynx: No oropharyngeal exudate.  Eyes:     Conjunctiva/sclera: Conjunctivae normal.     Pupils: Pupils are equal, round, and reactive to light.  Neck:     Musculoskeletal: Normal range of motion and neck supple.     Comments: No meningismus. Cardiovascular:     Rate and Rhythm: Normal rate and regular rhythm.     Heart sounds: Normal heart sounds. No murmur.  Pulmonary:  Effort: Pulmonary effort is normal. No respiratory distress.     Breath sounds: Normal breath sounds.  Abdominal:     Palpations: Abdomen is soft.     Tenderness: There is abdominal tenderness. There is guarding. There is no rebound.     Comments: Epigastric tenderness with voluntary guarding, no rebound No lower abdominal tenderness  Genitourinary:    Comments: No testicular tenderness Musculoskeletal: Normal range of motion.        General: No tenderness.  Skin:    General: Skin is warm.  Neurological:     Mental Status: He is alert and oriented to person, place, and time.     Cranial Nerves: No cranial nerve deficit.     Motor: No abnormal muscle tone.     Coordination: Coordination normal.     Comments: No ataxia on finger to nose bilaterally. No pronator drift. 5/5 strength throughout. CN 2-12 intact.Equal grip strength. Sensation intact.   Psychiatric:        Behavior: Behavior normal.      ED Treatments / Results  Labs (all labs ordered are listed, but only abnormal results are displayed) Labs Reviewed  CBC WITH DIFFERENTIAL/PLATELET - Abnormal; Notable for the following components:      Result Value   Neutro Abs 8.3 (*)    All other components within normal limits  COMPREHENSIVE METABOLIC PANEL - Abnormal; Notable for the following components:    Potassium 3.0 (*)    All other components within normal limits  LIPASE, BLOOD - Abnormal; Notable for the following components:   Lipase 56 (*)    All other components within normal limits  RAPID URINE DRUG SCREEN, HOSP PERFORMED - Abnormal; Notable for the following components:   Opiates POSITIVE (*)    Benzodiazepines POSITIVE (*)    All other components within normal limits  URINALYSIS, ROUTINE W REFLEX MICROSCOPIC - Abnormal; Notable for the following components:   Color, Urine COLORLESS (*)    Specific Gravity, Urine 1.003 (*)    All other components within normal limits  TROPONIN I  MAGNESIUM    EKG EKG Interpretation  Date/Time:  Monday December 27 2018 03:45:14 EST Ventricular Rate:  73 PR Interval:    QRS Duration: 84 QT Interval:  367 QTC Calculation: 405 R Axis:   51 Text Interpretation:  Sinus rhythm Probable anteroseptal infarct, old No significant change was found Confirmed by Glynn Octave [phone]) on 12/27/2018 3:51:21 AM   Radiology Ct Abdomen Pelvis W Contrast  Result Date: 12/27/2018 CLINICAL DATA:  History pancreatitis.  First episode. EXAM: CT ABDOMEN AND PELVIS WITH CONTRAST TECHNIQUE: Multidetector CT imaging of the abdomen and pelvis was performed using the standard protocol following bolus administration of intravenous contrast. CONTRAST:  ISOVUE-300 IOPAMIDOL (ISOVUE-300) INJECTION 61% COMPARISON:  12/25/2018 FINDINGS: Lower chest: Clear lung bases. Normal heart size without pericardial or pleural effusion. Hepatobiliary: Normal liver. No gallstones or biliary duct dilatation. Pancreas: Pancreas enhances normally. Minimal fluid is identified adjacent the gallbladder and pancreatic head, including on image 29/2 and coronal image 29. No well-defined peripancreatic fluid collection or duct dilatation. Spleen: Normal in size, without focal abnormality. Adrenals/Urinary Tract: Normal adrenal glands. Normal kidneys, without hydronephrosis. Normal urinary  bladder. Stomach/Bowel: Normal stomach, without wall thickening. Normal colon, appendix, and terminal ileum. Normal small bowel. Vascular/Lymphatic: Aortic atherosclerosis. Patent portal and splenic veins. No abdominopelvic adenopathy. Reproductive: Normal prostate. Other: No significant free fluid. Musculoskeletal: No acute osseous abnormality. IMPRESSION: 1. Small amount of fluid adjacent the pancreatic head and  gallbladder, likely related to the clinical history of pancreatitis. No acute complication. 2. No evidence of choledocholithiasis or biliary duct dilatation. 3.  Aortic Atherosclerosis (ICD10-I70.0).  This is age advanced. Electronically Signed   By: Jeronimo Greaves M.D.   On: 12/27/2018 07:25   Ct Abdomen Pelvis W Contrast  Result Date: 12/25/2018 CLINICAL DATA:  Abdominal pain, nausea, vomiting x 5 days. No hx of abdominal surgery/ EXAM: CT ABDOMEN AND PELVIS WITH CONTRAST TECHNIQUE: Multidetector CT imaging of the abdomen and pelvis was performed using the standard protocol following bolus administration of intravenous contrast. CONTRAST:  ISOVUE-300 IOPAMIDOL (ISOVUE-300) INJECTION 61% COMPARISON:  09/19/2018 FINDINGS: Lower chest: No acute abnormality. Hepatobiliary: No focal liver lesion. No biliary ductal dilatation. Gallbladder decompressed. Pancreas: Unremarkable. No pancreatic ductal dilatation or surrounding inflammatory changes. Spleen: Normal in size without focal abnormality. Adrenals/Urinary Tract: Normal adrenals. No renal mass or hydronephrosis. Urinary bladder incompletely distended. Stomach/Bowel: The stomach is incompletely distended. Gas and fluid distends the proximal duodenum, which is decompressed distally. Small bowel is nondilated. Appendix not discretely identified. No pericecal inflammatory/edematous change. The colon is nondilated, unremarkable. Vascular/Lymphatic: No significant vascular findings are present. No enlarged abdominal or pelvic lymph nodes. Reproductive:  Prostate is unremarkable. Other: No ascites. No free air. Musculoskeletal: No acute or significant osseous findings. IMPRESSION: No acute findings. Electronically Signed   By: Corlis Leak M.D.   On: 12/25/2018 09:58    Procedures Procedures (including critical care time)  Medications Ordered in ED Medications  sodium chloride 0.9 % bolus 1,000 mL (has no administration in time range)  potassium chloride SA (K-DUR,KLOR-CON) CR tablet 40 mEq (has no administration in time range)  HYDROmorphone (DILAUDID) injection 1 mg (1 mg Intravenous Given 12/27/18 0427)  ondansetron (ZOFRAN) injection 4 mg (4 mg Intravenous Given 12/27/18 0427)     Initial Impression / Assessment and Plan / ED Course  I have reviewed the triage vital signs and the nursing notes.  Pertinent labs & imaging results that were available during my care of the patient were reviewed by me and considered in my medical decision making (see chart for details).   Recurrent upper abdominal pain with nausea.  History of gastritis and duodenitis on EGD in July.  IVF and symptom control given.  Labs with normal WBC. No fever. LFTs normal. Lipase minimally elevated and improved from previous.   Pain and nausea persist with dry heaving. Additional meds ordered. Repeat CT shows fluid collection along pancreas.no gallstones.  Persistent pain and nausea. Appears to have mild pancreatitis as well as gastritis. Advised bowel rest, symptom control. Patient has court date tomorrow. Doesn't want to stay but still with pain and not able to tolerate PO.  Advised avoid alcohol, NSAIDs, caffeine,spicy foods.  Observation admission d/w Dr. Gwenlyn Perking.   Final Clinical Impressions(s) / ED Diagnoses   Final diagnoses:  Acute pancreatitis, unspecified complication status, unspecified pancreatitis type  Acute gastritis without hemorrhage, unspecified gastritis type    ED Discharge Orders    None       Kendle Erker, Jeannett Senior, MD 12/27/18 [phone]

## 2018-12-27 NOTE — ED Notes (Signed)
Patient transported to CT 

## 2018-12-27 NOTE — Plan of Care (Signed)

## 2018-12-27 NOTE — H&P (Signed)
History and Physical    Alan Jennings SEG:[id number] DOB: Apr 23, 1981 DOA: 12/27/2018  Referring MD/NP/PA: Dr. Manus Gunning. PCP: Vertis Kelch, NP  Patient coming from: Home  Chief Complaint: epigastric abdominal pain, nausea, vomiting.  HPI: 38 year old male with PMH of gastritis, GERD, hypertension, seizures and PTSD comes to the ED due to a stabbing, 8/10, epigastric abdominal pain that radiates to his back. Pain started about 5 days ago but became severe last night around 11PM. He has been seen three time in ED with the same complaints (episodes since 12/22/18); but now was having trouble keeping things down.. Patient complains of nausea and dry hiving; with fear to even drink water; which has been compromising his ability to eat. Denies fevers, chest pain, shortness of breath, hemoptysis, changes in bowel movements, hematochezia or melena.   Lipase minimally elevated, low potassium; No fevers, Normal WBC. CT scan demonstrated Minimal fluid adjacent to the gallbladder and pancreatic head as per CT of abdomen and pelvis with contrast. Despite IVF's, pain meds and antiemetics in ED he continue to be symptomatic and [facility] was called to place in observation for further evaluation and management.  Past Medical/Surgical History: Past Medical History:  Diagnosis Date  . Anxiety   . Arthritis   . Back pain   . Complication of anesthesia    had a syncopal episode after rhinoplasty  . Depression   . GERD (gastroesophageal reflux disease)   . Headache   . History of kidney stones   . Hypertension   . PTSD (post-traumatic stress disorder)   . Seizures (HCC)   . Seizures (HCC)     Past Surgical History:  Procedure Laterality Date  . BIOPSY  06/25/2018   Procedure: BIOPSY;  Surgeon: Malissa Hippo, MD;  Location[facility];  Service: Endoscopy;;  gastric  . ESOPHAGOGASTRODUODENOSCOPY (EGD) WITH PROPOFOL N/A 06/25/2018   Procedure: ESOPHAGOGASTRODUODENOSCOPY (EGD) WITH PROPOFOL;  Surgeon:  Malissa Hippo, MD;  Location: [facility];  Service: Endoscopy;  Laterality: N/A;  . NOSE SURGERY     [facility]  . ORIF HUMERUS FRACTURE  04/26/2012   Procedure: OPEN REDUCTION INTERNAL FIXATION (ORIF) PROXIMAL HUMERUS FRACTURE;  Surgeon: Vickki Hearing, MD;  Location: [facility];  Service: Orthopedics;  Laterality: Left;  Open Reduction Internal Fixation of Left Proximal Humerus Fracture, Closing Wedge Osteotomy, Bone Graft  . RHINOPLASTY     [facility]  . SHOULDER SURGERY      Social History:  reports that he has quit smoking. His smoking use included e-cigarettes. He has a 3.50 pack-year smoking history. He has never used smokeless tobacco. He reports previous drug use. Drug: Marijuana. He reports that he does not drink alcohol.  Allergies: Allergies  Allergen Reactions  . Tramadol Other (See Comments)    SEIZURES  . Nsaids     Has caused ulcers and needs to avoid.    Family History:  Heart Disease and mental illnesses (Mother and Father)  Prior to Admission medications   Medication Sig Start Date End Date Taking? Authorizing Provider  acetaminophen (TYLENOL) 500 MG tablet Take 1,500 mg by mouth every 6 (six) hours as needed for moderate pain.   Yes [provider]  HYDROcodone-acetaminophen (NORCO/VICODIN) 5-325 MG tablet Take 1 tablet by mouth every 6 (six) hours as needed for moderate pain. 12/25/18  Yes Bethann Berkshire, MD  ondansetron (ZOFRAN) 4 MG tablet Take 1 tablet (4 mg total) by mouth every 6 (six) hours as needed. 12/22/18  Yes Dione Booze,  MD  pantoprazole (PROTONIX) 40 MG tablet Take 1 tablet (40 mg total) by mouth daily. One tab 30 minutes before breakfast and one tab 30 minutes before supper. Patient taking differently: Take 80 mg by mouth 2 (two) times daily before a meal. One tab 30 minutes before breakfast and one tab 30 minutes before supper. 05/12/18  Yes Setzer, Terri L, NP  sucralfate (CARAFATE) 1 GM/10ML suspension Take 10 mLs (1 g total) by mouth 4 (four) times  daily. 06/25/18 06/25/19 Yes Rehman, Joline MaxcyNajeeb U, MD    Review of Systems:  Negative except as otherwise mentioned in HPI.  Physical Exam: Vitals:   12/27/18 1300 12/27/18 1305 12/27/18 1316 12/27/18 1335  BP: 110/67   134/83  Pulse: (!) 47 (!) 55 64 62  Resp: 12 13 14 19   Temp:    97.9 F (36.6 C)  TempSrc:    Oral  SpO2: 98% 99% 100% 100%  Weight:    68 kg  Height:    5\' 8"  (1.727 m)     Constitutional:  Patient in no acute distress after 3rd round of pain meds; reporting abd pain and feeling nauseated. Eyes: PERRL, lids and conjunctivae normal, no icterus, no nystagmus. ENMT: Mucous membranes are moist. Posterior pharynx clear of any exudate or lesions. Normal dentition.  Neck: normal, supple, no masses, no thyromegaly Respiratory: clear to auscultation bilaterally, no wheezing, no crackles. Normal respiratory effort. No accessory muscle use.  Cardiovascular: Regular rate and rhythm, no murmurs, rubs or  gallops. No extremity edema. No carotid bruits.  Abdomen: epigastric tenderness with voluntary guarding. Positive BS, no distension, no masses.  Musculoskeletal: no clubbing or cyanosis. No joint deformity upper and lower extremities. Good range of motion. Normal muscle tone.  Skin: warm. No rashes, lesions or ulcers. Neurologic: cranial nerves intact. Preserved sensation and  DTR. Strength 5/5 in all 4.  Psychiatric: Normal judgment and insight. Alert and oriented x 3. Normal mood and behavior.   Labs on Admission: I have personally reviewed the following labs and imaging studies  CBC: Recent Labs  Lab 12/22/18 0200 12/25/18 0845 12/27/18 0335  WBC 9.2 9.1 10.5  NEUTROABS 6.5 6.8 8.3*  HGB 15.3 15.8 13.5  HCT 46.5 47.0 40.4  MCV 92.6 92.3 92.9  PLT 357 346 305   Basic Metabolic Panel: Recent Labs  Lab 12/22/18 0200 12/25/18 0845 12/27/18 0335 12/27/18 0830  NA 137 140 140  --   K 3.1* 3.2* 3.0*  --   CL 103 108 106  --   CO2 20* 19* 24  --   GLUCOSE 98 88 88   --   BUN 7 8 10   --   CREATININE 0.95 0.91 0.89  --   CALCIUM 9.3 9.1 9.1  --   MG  --   --   --  2.1   GFR: Estimated Creatinine Clearance: 109.3 mL/min (by C-G formula based on SCr of 0.89 mg/dL).   Liver Function Tests: Recent Labs  Lab 12/22/18 0200 12/25/18 0845 12/27/18 0335  AST 24 18 16   ALT 21 18 14   ALKPHOS 70 68 60  BILITOT 0.7 0.4 0.4  PROT 8.4* 7.9 7.1  ALBUMIN 4.9 4.5 4.1   Recent Labs  Lab 12/22/18 0200 12/25/18 0845 12/27/18 0335  LIPASE 47 61* 56*   Cardiac Enzymes: Recent Labs  Lab 12/27/18 0335  TROPONINI <0.03   Urine analysis:    Component Value Date/Time   COLORURINE COLORLESS (A) 12/27/2018 0324   APPEARANCEUR CLEAR 12/27/2018  0324   LABSPEC 1.003 (L) 12/27/2018 0324   PHURINE 7.0 12/27/2018 0324   GLUCOSEU NEGATIVE 12/27/2018 0324   HGBUR NEGATIVE 12/27/2018 0324   BILIRUBINUR NEGATIVE 12/27/2018 0324   KETONESUR NEGATIVE 12/27/2018 0324   PROTEINUR NEGATIVE 12/27/2018 0324   UROBILINOGEN 0.2 02/18/2012 1548   NITRITE NEGATIVE 12/27/2018 0324   LEUKOCYTESUR NEGATIVE 12/27/2018 0324   Radiological Exams on Admission: Ct Abdomen Pelvis W Contrast  Result Date: 12/27/2018 CLINICAL DATA:  History pancreatitis.  First episode. EXAM: CT ABDOMEN AND PELVIS WITH CONTRAST TECHNIQUE: Multidetector CT imaging of the abdomen and pelvis was performed using the standard protocol following bolus administration of intravenous contrast. CONTRAST:  ISOVUE-300 IOPAMIDOL (ISOVUE-300) INJECTION 61% COMPARISON:  12/25/2018 FINDINGS: Lower chest: Clear lung bases. Normal heart size without pericardial or pleural effusion. Hepatobiliary: Normal liver. No gallstones or biliary duct dilatation. Pancreas: Pancreas enhances normally. Minimal fluid is identified adjacent the gallbladder and pancreatic head, including on image 29/2 and coronal image 29. No well-defined peripancreatic fluid collection or duct dilatation. Spleen: Normal in size, without focal  abnormality. Adrenals/Urinary Tract: Normal adrenal glands. Normal kidneys, without hydronephrosis. Normal urinary bladder. Stomach/Bowel: Normal stomach, without wall thickening. Normal colon, appendix, and terminal ileum. Normal small bowel. Vascular/Lymphatic: Aortic atherosclerosis. Patent portal and splenic veins. No abdominopelvic adenopathy. Reproductive: Normal prostate. Other: No significant free fluid. Musculoskeletal: No acute osseous abnormality. IMPRESSION: 1. Small amount of fluid adjacent the pancreatic head and gallbladder, likely related to the clinical history of pancreatitis. No acute complication. 2. No evidence of choledocholithiasis or biliary duct dilatation. 3.  Aortic Atherosclerosis (ICD10-I70.0).  This is age advanced. Electronically Signed   By: Jeronimo Greaves M.D.   On: 12/27/2018 07:25    EKG: normal sinus rhythm; no acute ischemic changes; poor R wave progression; tracing unchanged from prior.  Assessment/Plan 1-Acute Pancreatitis -no hx of alcohol use and CT scan with significant gallbladder abnormalities. -place in observation -bowel rest, IVF's, PRN antiemetics and analgesics -check lipid panel in am -check RUQ [place]  2-GERD, Gastritis and duodenitis -continue maximun dose of PPI and start Pepcid QHS -PRN antiemetics  3-Hypertension -hx of elevated BP -no taking any meds at home -will monitor VS  4-PTSD -no SI or hallucinations currently -mood stable -will monitor  5-hypokalemia -due to GI loses (vomiting prior to admission) and poor oral intake -will replete and follow trend -checking Mg level  DVT prophylaxis: heparin  Code Status: full code Family Communication: No family members at bedside Disposition Plan: home when stable and tolerating PO's Consults called: none  Admission status: observation, LOS < 2 midnights; med-surg    Time Spent: 60 minutes  Vassie Loll MD [facility] Pager [phone]  If 7PM-7AM, please contact  night-coverage [email or web address] Password [email or web address]  12/27/2018, 2:16 PM

## 2018-12-28 ENCOUNTER — Observation Stay (HOSPITAL_COMMUNITY): Payer: Medicaid Other

## 2018-12-28 LAB — LIPID PANEL
Cholesterol: 113 mg/dL (ref 0–200)
HDL: 45 mg/dL (ref >40)
LDL Cholesterol: 39 mg/dL (ref 0–99)
Total CHOL/HDL Ratio: 2.5 RATIO
Triglycerides: 143 mg/dL (ref <150)
VLDL: 29 mg/dL (ref 0–40)

## 2018-12-28 MED FILL — SUBOXONE 8 MG-2 MG SL FILM: 8-2 | 14 days supply | Qty: 42 | Fill #0

## 2018-12-28 NOTE — Progress Notes (Signed)
Patient alert and oriented x4. Patient fully dressed walked off unit. Nurse and nurse tech went to look for patient. Patient had left hospital.  Patient iv in trash can. Patient didn't alert staff that he was leaving. Notified MD.

## 2018-12-28 NOTE — Discharge Summary (Signed)
Physician Discharge Summary  Alan Jennings XTK:[id number] DOB: 07/16/1981 DOA: 12/27/2018  PCP: Vertis Kelch, NP  Admit date: 12/27/2018 Discharge date: 12/28/2018  Time spent: 25 minutes  Recommendations for Outpatient Follow-up:  Left AMA.  Discharge Diagnoses:  Active Problems:   Pancreatitis, acute   Discharge Condition: Patient was alert, awake and oriented x4.  No major distress appreciated.  He left the hospital AGAINST MEDICAL ADVICE.  Filed Weights   12/27/18 0244 12/27/18 1335  Weight: 68 kg 68 kg    History of present illness:  38 year old male with PMH of gastritis, GERD, hypertension, seizures and PTSD comes to the ED due to a stabbing, 8/10, epigastric abdominal pain that radiates to his back. Pain started about 5 days ago but became severe last night around 11PM. He has been seen three time in ED with the same complaints (episodes since 12/22/18); but now was having trouble keeping things down.. Patient complains of nausea and dry hiving; with fear to even drink water; which has been compromising his ability to eat. Denies fevers, chest pain, shortness of breath, hemoptysis, changes in bowel movements, hematochezia or melena.   Lipase minimally elevated, low potassium; No fevers, Normal WBC. CT scan demonstrated Minimal fluid adjacent to the gallbladder and pancreatic head as per CT of abdomen and pelvis with contrast. Despite IVF's, pain meds and antiemetics in ED he continue to be symptomatic and [facility] was called to place in observation for further evaluation and management.  Hospital Course:  1-acute pancreatitis: -Patient without history of alcohol and CT scan not demonstrating significant gallbladder abnormalities. -Apparently patient has improvement in his symptoms and early morning on 12/28/2018 got himself dressed and left the hospital AGAINST MEDICAL ADVICE without discussing or expressing any issues or complaints of the nursing staff. -Last vital signs  recorded-within normal limits -No prescriptions or follow-up instructions able to be given. -Right upper quadrant ultrasound was ordered with the patient left without having the test done -Lipid profile was within normal limits.  Procedures:  See below for x-ray reports.  Consultations:  None   Discharge Instructions  No instructions given as the patient left AGAINST MEDICAL ADVICE.   Allergies  Allergen Reactions  . Tramadol Other (See Comments)    SEIZURES  . Nsaids     Has caused ulcers and needs to avoid.    The results of significant diagnostics from this hospitalization (including imaging, microbiology, ancillary and laboratory) are listed below for reference.    Significant Diagnostic Studies: Ct Abdomen Pelvis W Contrast  Result Date: 12/27/2018 CLINICAL DATA:  History pancreatitis.  First episode. EXAM: CT ABDOMEN AND PELVIS WITH CONTRAST TECHNIQUE: Multidetector CT imaging of the abdomen and pelvis was performed using the standard protocol following bolus administration of intravenous contrast. CONTRAST:  ISOVUE-300 IOPAMIDOL (ISOVUE-300) INJECTION 61% COMPARISON:  12/25/2018 FINDINGS: Lower chest: Clear lung bases. Normal heart size without pericardial or pleural effusion. Hepatobiliary: Normal liver. No gallstones or biliary duct dilatation. Pancreas: Pancreas enhances normally. Minimal fluid is identified adjacent the gallbladder and pancreatic head, including on image 29/2 and coronal image 29. No well-defined peripancreatic fluid collection or duct dilatation. Spleen: Normal in size, without focal abnormality. Adrenals/Urinary Tract: Normal adrenal glands. Normal kidneys, without hydronephrosis. Normal urinary bladder. Stomach/Bowel: Normal stomach, without wall thickening. Normal colon, appendix, and terminal ileum. Normal small bowel. Vascular/Lymphatic: Aortic atherosclerosis. Patent portal and splenic veins. No abdominopelvic adenopathy. Reproductive: Normal  prostate. Other: No significant free fluid. Musculoskeletal: No acute osseous abnormality. IMPRESSION:  1. Small amount of fluid adjacent the pancreatic head and gallbladder, likely related to the clinical history of pancreatitis. No acute complication. 2. No evidence of choledocholithiasis or biliary duct dilatation. 3.  Aortic Atherosclerosis (ICD10-I70.0).  This is age advanced. Electronically Signed   By: Jeronimo Greaves M.D.   On: 12/27/2018 07:25   Ct Abdomen Pelvis W Contrast  Result Date: 12/25/2018 CLINICAL DATA:  Abdominal pain, nausea, vomiting x 5 days. No hx of abdominal surgery/ EXAM: CT ABDOMEN AND PELVIS WITH CONTRAST TECHNIQUE: Multidetector CT imaging of the abdomen and pelvis was performed using the standard protocol following bolus administration of intravenous contrast. CONTRAST:  ISOVUE-300 IOPAMIDOL (ISOVUE-300) INJECTION 61% COMPARISON:  09/19/2018 FINDINGS: Lower chest: No acute abnormality. Hepatobiliary: No focal liver lesion. No biliary ductal dilatation. Gallbladder decompressed. Pancreas: Unremarkable. No pancreatic ductal dilatation or surrounding inflammatory changes. Spleen: Normal in size without focal abnormality. Adrenals/Urinary Tract: Normal adrenals. No renal mass or hydronephrosis. Urinary bladder incompletely distended. Stomach/Bowel: The stomach is incompletely distended. Gas and fluid distends the proximal duodenum, which is decompressed distally. Small bowel is nondilated. Appendix not discretely identified. No pericecal inflammatory/edematous change. The colon is nondilated, unremarkable. Vascular/Lymphatic: No significant vascular findings are present. No enlarged abdominal or pelvic lymph nodes. Reproductive: Prostate is unremarkable. Other: No ascites. No free air. Musculoskeletal: No acute or significant osseous findings. IMPRESSION: No acute findings. Electronically Signed   By: Corlis Leak M.D.   On: 12/25/2018 09:58   Labs: Basic Metabolic Panel: Recent  Labs  Lab 12/22/18 0200 12/25/18 0845 12/27/18 0335 12/27/18 0830  NA 137 140 140  --   K 3.1* 3.2* 3.0*  --   CL 103 108 106  --   CO2 20* 19* 24  --   GLUCOSE 98 88 88  --   BUN 7 8 10   --   CREATININE 0.95 0.91 0.89  --   CALCIUM 9.3 9.1 9.1  --   MG  --   --   --  2.1   Liver Function Tests: Recent Labs  Lab 12/22/18 0200 12/25/18 0845 12/27/18 0335  AST 24 18 16   ALT 21 18 14   ALKPHOS 70 68 60  BILITOT 0.7 0.4 0.4  PROT 8.4* 7.9 7.1  ALBUMIN 4.9 4.5 4.1   Recent Labs  Lab 12/22/18 0200 12/25/18 0845 12/27/18 0335  LIPASE 47 61* 56*   CBC: Recent Labs  Lab 12/22/18 0200 12/25/18 0845 12/27/18 0335  WBC 9.2 9.1 10.5  NEUTROABS 6.5 6.8 8.3*  HGB 15.3 15.8 13.5  HCT 46.5 47.0 40.4  MCV 92.6 92.3 92.9  PLT 357 346 305   Cardiac Enzymes: Recent Labs  Lab 12/27/18 0335  TROPONINI <0.03    Signed:  Vassie Loll  [facility] Pager: [phone] 12/28/2018, 7:58 AM

## 2019-01-07 ENCOUNTER — Encounter (HOSPITAL_COMMUNITY): Payer: Self-pay

## 2019-01-07 ENCOUNTER — Emergency Department (HOSPITAL_COMMUNITY): Payer: Medicaid Other

## 2019-01-07 ENCOUNTER — Other Ambulatory Visit: Payer: Self-pay

## 2019-01-07 ENCOUNTER — Emergency Department (HOSPITAL_COMMUNITY)
Admission: EM | Admit: 2019-01-07 | Discharge: 2019-01-07 | Disposition: A | Discharge status: Home / Self Care | Payer: Medicaid Other | Attending: Emergency Medicine | Admitting: Emergency Medicine

## 2019-01-07 DIAGNOSIS — R110 Nausea: Secondary | ICD-10-CM

## 2019-01-07 DIAGNOSIS — R1013 Epigastric pain: Secondary | ICD-10-CM | POA: Diagnosis not present

## 2019-01-07 DIAGNOSIS — Z79899 Other long term (current) drug therapy: Secondary | ICD-10-CM | POA: Insufficient documentation

## 2019-01-07 DIAGNOSIS — G8929 Other chronic pain: Secondary | ICD-10-CM | POA: Insufficient documentation

## 2019-01-07 DIAGNOSIS — Z87891 Personal history of nicotine dependence: Secondary | ICD-10-CM | POA: Diagnosis not present

## 2019-01-07 DIAGNOSIS — F1290 Cannabis use, unspecified, uncomplicated: Secondary | ICD-10-CM

## 2019-01-07 DIAGNOSIS — I10 Essential (primary) hypertension: Secondary | ICD-10-CM | POA: Diagnosis not present

## 2019-01-07 HISTORY — DX: Epigastric pain: R10.13

## 2019-01-07 HISTORY — DX: Neuralgia and neuritis, unspecified: M79.2

## 2019-01-07 HISTORY — DX: Other chronic pain: G89.29

## 2019-01-07 LAB — CBC
HCT: 44.9 % (ref 39.0–52.0)
Hemoglobin: 14.1 g/dL (ref 13.0–17.0)
MCH: 30.1 pg (ref 26.0–34.0)
MCHC: 31.4 g/dL (ref 30.0–36.0)
MCV: 95.9 fL (ref 80.0–100.0)
Platelets: 250 10*3/uL (ref 150–400)
RBC: 4.68 MIL/uL (ref 4.22–5.81)
RDW: 13.2 % (ref 11.5–15.5)
WBC: 9.5 10*3/uL (ref 4.0–10.5)
nRBC: 0 % (ref 0.0–0.2)

## 2019-01-07 LAB — COMPREHENSIVE METABOLIC PANEL
ALT: 17 U/L (ref 0–44)
AST: 21 U/L (ref 15–41)
Albumin: 4.6 g/dL (ref 3.5–5.0)
Alkaline Phosphatase: 66 U/L (ref 38–126)
Anion gap: 11 (ref 5–15)
BUN: 8 mg/dL (ref 6–20)
CHLORIDE: 104 mmol/L (ref 98–111)
CO2: 23 mmol/L (ref 22–32)
Calcium: 9.5 mg/dL (ref 8.9–10.3)
Creatinine, Ser: 0.79 mg/dL (ref 0.61–1.24)
GFR calc Af Amer: >60 mL/min (ref >60)
GFR calc non Af Amer: >60 mL/min (ref >60)
Glucose, Bld: 94 mg/dL (ref 70–99)
POTASSIUM: 4 mmol/L (ref 3.5–5.1)
Sodium: 138 mmol/L (ref 135–145)
Total Bilirubin: 0.5 mg/dL (ref 0.3–1.2)
Total Protein: 8.3 g/dL — ABNORMAL HIGH (ref 6.5–8.1)

## 2019-01-07 LAB — URINALYSIS, ROUTINE W REFLEX MICROSCOPIC
Bilirubin Urine: NEGATIVE
Glucose, UA: NEGATIVE mg/dL
Hgb urine dipstick: NEGATIVE
Ketones, ur: 20 mg/dL — AB
Leukocytes, UA: NEGATIVE
Nitrite: NEGATIVE
Protein, ur: NEGATIVE mg/dL
Specific Gravity, Urine: 1.009 (ref 1.005–1.030)
pH: 6 (ref 5.0–8.0)

## 2019-01-07 LAB — RAPID URINE DRUG SCREEN, HOSP PERFORMED
Amphetamines: NOT DETECTED
Barbiturates: NOT DETECTED
Benzodiazepines: POSITIVE — AB
Cocaine: NOT DETECTED
Opiates: NOT DETECTED
TETRAHYDROCANNABINOL: POSITIVE — AB

## 2019-01-07 LAB — LIPASE, BLOOD: Lipase: 28 U/L (ref 11–51)

## 2019-01-07 MED ORDER — LIDOCAINE VISCOUS HCL 2 % MT SOLN
15.0000 mL | Freq: Once | OROMUCOSAL | Status: AC
  Administered 2019-01-07: 15 mL via ORAL
  Filled 2019-01-07: qty 15

## 2019-01-07 MED ORDER — PROMETHAZINE HCL 25 MG/ML IJ SOLN
25.0000 mg | Freq: Once | INTRAMUSCULAR | Status: AC
  Administered 2019-01-07: 25 mg via INTRAMUSCULAR
  Filled 2019-01-07: qty 1

## 2019-01-07 MED ORDER — FENTANYL CITRATE (PF) 100 MCG/2ML IJ SOLN
100.0000 ug | Freq: Once | INTRAMUSCULAR | Status: AC
  Administered 2019-01-07: 100 ug via INTRAMUSCULAR
  Filled 2019-01-07: qty 2

## 2019-01-07 MED ORDER — PROMETHAZINE HCL 25 MG PO TABS: 25.0000 mg | ORAL_TABLET | Freq: Four times a day (QID) | ORAL | 0 refills | Status: DC | PRN

## 2019-01-07 MED ORDER — SODIUM CHLORIDE 0.9% FLUSH: 3.0000 mL | Freq: Once | INTRAVENOUS | Status: DC

## 2019-01-07 MED ORDER — FAMOTIDINE 20 MG PO TABS: 20.0000 mg | ORAL_TABLET | Freq: Two times a day (BID) | ORAL | 0 refills | Status: DC

## 2019-01-07 MED ORDER — ALUM & MAG HYDROXIDE-SIMETH 200-200-20 MG/5ML PO SUSP
30.0000 mL | Freq: Once | ORAL | Status: AC
  Administered 2019-01-07: 30 mL via ORAL
  Filled 2019-01-07: qty 30

## 2019-01-07 MED ORDER — FAMOTIDINE 20 MG PO TABS
40.0000 mg | ORAL_TABLET | Freq: Once | ORAL | Status: AC
  Administered 2019-01-07: 40 mg via ORAL
  Filled 2019-01-07: qty 2

## 2019-01-07 NOTE — ED Provider Notes (Signed)
[facility] EMERGENCY DEPARTMENT Provider Note   CSN: [id number] Arrival date & time: 01/07/19  1757     History   Chief Complaint Chief Complaint  Patient presents with  . Abdominal Pain    HPI Alan Jennings is a 38 y.o. male.  HPI  Pt was seen at 2020.  Per pt, c/o gradual onset and persistence of constant acute flair of his chronic mid-epigastric abd "pain" for the past 1.5 weeks.  Has been associated with "passing out from the pain" 2 days ago. Pt states he hit his head when he fell. No seizure activity, no incont bowel/bladder, no AMS. Describes the abd pain as "sharp" and per his usual chronic pain pattern.  Denies N/V, no diarrhea, no fevers, no neck pain, no back pain, no rash, no CP/SOB, no black or blood in stools or emesis, no dysuria/hematuria, no testicular pain/swelling. The symptoms have been associated with no other complaints. The patient has a significant history of similar symptoms previously, recently being evaluated for this complaint and multiple prior evals for same. This is pt's 4th ED visit this month for this complaint. Pt was admitted to the hospital on 12/27/2018 for this complaint and left AMA.   .      Past Medical History:  Diagnosis Date  . Abdominal pain, chronic, epigastric   . Anxiety   . Arthritis   . Back pain   . Complication of anesthesia    had a syncopal episode after rhinoplasty  . Depression   . GERD (gastroesophageal reflux disease)   . Headache   . History of kidney stones   . Hypertension   . Neurogenic pain   . PTSD (post-traumatic stress disorder)   . Seizures (HCC)   . Seizures [facility])     Patient Active Problem List   Diagnosis Date Noted  . Pancreatitis, acute 12/27/2018  . Hematemesis without nausea 05/13/2018  . Abdominal pain, chronic, epigastric 05/13/2018  . Gastroesophageal reflux disease without esophagitis 05/13/2018  . Abdominal pain, epigastric 05/13/2018  . Muscle weakness (generalized) 09/22/2012  .  Neurogenic pain 08/03/2012  . Hypokalemia 06/03/2012  . Left shoulder pain 06/03/2012  . Wound infection after surgery 06/03/2012  . Status post shoulder surgery 05/11/2012  . Shoulder injury 11/13/2011  . Contusion of arm, left 10/08/2011  . Fracture of humerus, proximal, left, closed 08/27/2011  . Fracture, humerus 06/11/2011  . CLOSED FRACTURE UNSPEC PART UPPER END HUMERUS 02/27/2011    Past Surgical History:  Procedure Laterality Date  . BIOPSY  06/25/2018   Procedure: BIOPSY;  Surgeon: Malissa Hippo, MD;  Location: [facility] ENDO SUITE;  Service: Endoscopy;;  gastric  . ESOPHAGOGASTRODUODENOSCOPY (EGD) WITH PROPOFOL N/A 06/25/2018   Procedure: ESOPHAGOGASTRODUODENOSCOPY (EGD) WITH PROPOFOL;  Surgeon: Malissa Hippo, MD;  Location: [facility] ENDO SUITE;  Service: Endoscopy;  Laterality: N/A;  . NOSE SURGERY     [facility]  . ORIF HUMERUS FRACTURE  04/26/2012   Procedure: OPEN REDUCTION INTERNAL FIXATION (ORIF) PROXIMAL HUMERUS FRACTURE;  Surgeon: Vickki Hearing, MD;  Location: [facility] ORS;  Service: Orthopedics;  Laterality: Left;  Open Reduction Internal Fixation of Left Proximal Humerus Fracture, Closing Wedge Osteotomy, Bone Graft  . RHINOPLASTY     [facility]  . SHOULDER SURGERY          Home Medications    Prior to Admission medications   Medication Sig Start Date End Date Taking? Authorizing Provider  acetaminophen (TYLENOL) 500 MG tablet Take 1,500 mg by mouth every 6 (six)  hours as needed for moderate pain.   Yes [provider]  pantoprazole (PROTONIX) 40 MG tablet Take 1 tablet (40 mg total) by mouth daily. One tab 30 minutes before breakfast and one tab 30 minutes before supper. Patient taking differently: Take 80 mg by mouth 2 (two) times daily before a meal. One tab 30 minutes before breakfast and one tab 30 minutes before supper. 05/12/18  Yes Setzer, Terri L, NP  sucralfate (CARAFATE) 1 GM/10ML suspension Take 10 mLs (1 g total) by mouth 4 (four) times daily. 06/25/18 06/25/19 Yes  Rehman, Joline MaxcyNajeeb U, MD  HYDROcodone-acetaminophen (NORCO/VICODIN) 5-325 MG tablet Take 1 tablet by mouth every 6 (six) hours as needed for moderate pain. Patient not taking: Reported on 01/07/2019 12/25/18   Bethann BerkshireZammit, Joseph, MD  ondansetron (ZOFRAN) 4 MG tablet Take 1 tablet (4 mg total) by mouth every 6 (six) hours as needed. Patient not taking: Reported on 01/07/2019 12/22/18   Dione BoozeGlick, David, MD    Family History Family History  Problem Relation Age of Onset  . Heart disease Other   . Arthritis Other   . Anesthesia problems Neg Hx   . Hypotension Neg Hx   . Malignant hyperthermia Neg Hx   . Pseudochol deficiency Neg Hx     Social History Social History   Tobacco Use  . Smoking status: Former Smoker    Packs/day: 0.50    Years: 7.00    Pack years: 3.50    Types: E-cigarettes  . Smokeless tobacco: Never Used  Substance Use Topics  . Alcohol use: No  . Drug use: Not Currently    Types: Marijuana     Allergies   Tramadol and Nsaids   Review of Systems Review of Systems ROS: Statement: All systems negative except as marked or noted in the HPI; Constitutional: Negative for fever and chills. ; ; Eyes: Negative for eye pain, redness and discharge. ; ; ENMT: Negative for ear pain, hoarseness, nasal congestion, sinus pressure and sore throat. ; ; Cardiovascular: Negative for chest pain, palpitations, diaphoresis, dyspnea and peripheral edema. ; ; Respiratory: Negative for cough, wheezing and stridor. ; ; Gastrointestinal: +chronic abd pain. Negative for nausea, vomiting, diarrhea, blood in stool, hematemesis, jaundice and rectal bleeding. . ; ; Genitourinary: Negative for dysuria, flank pain and hematuria. ; ; Musculoskeletal: Negative for back pain and neck pain. Negative for swelling and trauma.; ; Skin: Negative for pruritus, rash, abrasions, blisters, bruising and skin lesion.; ; Neuro: Negative for headache, lightheadedness and neck stiffness. Negative for weakness, altered level of  consciousness, altered mental status, extremity weakness, paresthesias, involuntary movement, seizure and +syncope.       Physical Exam Updated Vital Signs BP (!) 171/105 (BP Location: Right Arm)   Pulse (!) 121   Temp 98.4 F (36.9 C) (Oral)   Resp 18   Ht 5\' 8"  (1.727 m)   Wt 68 kg   SpO2 100%   BMI 22.81 kg/m    BP 128/75   Pulse 61   Temp 98.4 F (36.9 C) (Oral)   Resp 18   Ht 5\' 8"  (1.727 m)   Wt 68 kg   SpO2 99%   BMI 22.81 kg/m    Physical Exam 2025: Physical examination:  Nursing notes reviewed; Vital signs and O2 SAT reviewed;  Constitutional: Well developed, Well nourished, Well hydrated, In no acute distress; Head:  Normocephalic, atraumatic; Eyes: EOMI, PERRL, No scleral icterus; ENMT: Mouth and pharynx normal, Mucous membranes moist; Neck: Supple, Full range of  motion, No lymphadenopathy; Cardiovascular: Regular rate and rhythm, No gallop; Respiratory: Breath sounds clear & equal bilaterally, No wheezes.  Speaking full sentences with ease, Normal respiratory effort/excursion; Chest: Nontender, Movement normal; Abdomen: Soft, +mid-epigastric tenderness to palp. No rebound or guarding. Nondistended, Normal bowel sounds; Genitourinary: No CVA tenderness; Extremities: Peripheral pulses normal, No tenderness, No edema, No calf edema or asymmetry.; Neuro: AA&Ox3, Major CN grossly intact.  Speech clear. No gross focal motor or sensory deficits in extremities.; Skin: Color normal, Warm, Dry.     ED Treatments / Results  Labs (all labs ordered are listed, but only abnormal results are displayed)   EKG EKG Interpretation  Date/Time:  Friday January 07 2019 20:46:17 EST Ventricular Rate:  60 PR Interval:    QRS Duration: 83 QT Interval:  378 QTC Calculation: 378 R Axis:   66 Text Interpretation:  Sinus rhythm Anteroseptal infarct, age indeterminate When compared with ECG of 12/27/2018 No significant change was found Confirmed by Samuel Jester [phone]) on  01/07/2019 8:50:09 PM   Radiology   Procedures Procedures (including critical care time)  Medications Ordered in ED Medications  fentaNYL (SUBLIMAZE) injection 100 mcg (has no administration in time range)  famotidine (PEPCID) tablet 40 mg (40 mg Oral Given 01/07/19 2032)  alum & mag hydroxide-simeth (MAALOX/MYLANTA) 200-200-20 MG/5ML suspension 30 mL (30 mLs Oral Given 01/07/19 2032)    And  lidocaine (XYLOCAINE) 2 % viscous mouth solution 15 mL (15 mLs Oral Given 01/07/19 2032)     Initial Impression / Assessment and Plan / ED Course  I have reviewed the triage vital signs and the nursing notes.  Pertinent labs & imaging results that were available during my care of the patient were reviewed by me and considered in my medical decision making (see chart for details).  MDM Reviewed: previous chart, nursing note and vitals Reviewed previous: labs, ECG and CT scan Interpretation: labs, ECG and x-ray    Results for orders placed or performed during the hospital encounter of 01/07/19  Lipase, blood  Result Value Ref Range   Lipase 28 11 - 51 U/L  Comprehensive metabolic panel  Result Value Ref Range   Sodium 138 135 - 145 mmol/L   Potassium 4.0 3.5 - 5.1 mmol/L   Chloride 104 98 - 111 mmol/L   CO2 23 22 - 32 mmol/L   Glucose, Bld 94 70 - 99 mg/dL   BUN 8 6 - 20 mg/dL   Creatinine, Ser 7.00 0.61 - 1.24 mg/dL   Calcium 9.5 8.9 - 17.4 mg/dL   Total Protein 8.3 (H) 6.5 - 8.1 g/dL   Albumin 4.6 3.5 - 5.0 g/dL   AST 21 15 - 41 U/L   ALT 17 0 - 44 U/L   Alkaline Phosphatase 66 38 - 126 U/L   Total Bilirubin 0.5 0.3 - 1.2 mg/dL   GFR calc non Af Amer >60 >60 mL/min   GFR calc Af Amer >60 >60 mL/min   Anion gap 11 5 - 15  CBC  Result Value Ref Range   WBC 9.5 4.0 - 10.5 K/uL   RBC 4.68 4.22 - 5.81 MIL/uL   Hemoglobin 14.1 13.0 - 17.0 g/dL   HCT 94.4 96.7 - 59.1 %   MCV 95.9 80.0 - 100.0 fL   MCH 30.1 26.0 - 34.0 pg   MCHC 31.4 30.0 - 36.0 g/dL   RDW 63.8 46.6 - 59.9 %    Platelets 250 150 - 400 K/uL   nRBC 0.0 0.0 -  0.2 %  Urinalysis, Routine w reflex microscopic  Result Value Ref Range   Color, Urine STRAW (A) YELLOW   APPearance CLEAR CLEAR   Specific Gravity, Urine 1.009 1.005 - 1.030   pH 6.0 5.0 - 8.0   Glucose, UA NEGATIVE NEGATIVE mg/dL   Hgb urine dipstick NEGATIVE NEGATIVE   Bilirubin Urine NEGATIVE NEGATIVE   Ketones, ur 20 (A) NEGATIVE mg/dL   Protein, ur NEGATIVE NEGATIVE mg/dL   Nitrite NEGATIVE NEGATIVE   Leukocytes, UA NEGATIVE NEGATIVE  Urine rapid drug screen (hosp performed)  Result Value Ref Range   Opiates NONE DETECTED NONE DETECTED   Cocaine NONE DETECTED NONE DETECTED   Benzodiazepines POSITIVE (A) NONE DETECTED   Amphetamines NONE DETECTED NONE DETECTED   Tetrahydrocannabinol POSITIVE (A) NONE DETECTED   Barbiturates NONE DETECTED NONE DETECTED   Ct Abdomen Pelvis W Contrast Result Date: 12/27/2018 CLINICAL DATA:  History pancreatitis.  First episode. EXAM: CT ABDOMEN AND PELVIS WITH CONTRAST TECHNIQUE: Multidetector CT imaging of the abdomen and pelvis was performed using the standard protocol following bolus administration of intravenous contrast. CONTRAST:  ISOVUE-300 IOPAMIDOL (ISOVUE-300) INJECTION 61% COMPARISON:  12/25/2018 FINDINGS: Lower chest: Clear lung bases. Normal heart size without pericardial or pleural effusion. Hepatobiliary: Normal liver. No gallstones or biliary duct dilatation. Pancreas: Pancreas enhances normally. Minimal fluid is identified adjacent the gallbladder and pancreatic head, including on image 29/2 and coronal image 29. No well-defined peripancreatic fluid collection or duct dilatation. Spleen: Normal in size, without focal abnormality. Adrenals/Urinary Tract: Normal adrenal glands. Normal kidneys, without hydronephrosis. Normal urinary bladder. Stomach/Bowel: Normal stomach, without wall thickening. Normal colon, appendix, and terminal ileum. Normal small bowel. Vascular/Lymphatic: Aortic  atherosclerosis. Patent portal and splenic veins. No abdominopelvic adenopathy. Reproductive: Normal prostate. Other: No significant free fluid. Musculoskeletal: No acute osseous abnormality. IMPRESSION: 1. Small amount of fluid adjacent the pancreatic head and gallbladder, likely related to the clinical history of pancreatitis. No acute complication. 2. No evidence of choledocholithiasis or biliary duct dilatation. 3.  Aortic Atherosclerosis (ICD10-I70.0).  This is age advanced. Electronically Signed   By: Jeronimo Greaves M.D.   On: 12/27/2018 07:25     Dg Abd Acute W/chest Result Date: 01/07/2019 CLINICAL DATA:  Mid abdominal pain radiating to the back for a month. Nausea, vomiting and diarrhea. EXAM: DG ABDOMEN ACUTE W/ 1V CHEST COMPARISON:  12/27/2018 CT FINDINGS: There is no evidence of dilated bowel loops or free intraperitoneal air. No radiopaque calculi or other significant radiographic abnormality is seen. Heart size and mediastinal contours are within normal limits. Both lungs are clear. IMPRESSION: Negative abdominal radiographs.  No acute cardiopulmonary disease. Electronically Signed   By: Tollie Eth M.D.   On: 01/07/2019 21:09    Results for KYSHAWN, TRINDLE (MRN [id number]) as of 01/07/2019 21:30  Ref. Range 06/24/2018 10:23 12/22/2018 02:00 12/25/2018 08:45 12/27/2018 03:35 01/07/2019 19:31  Lipase Latest Ref Range: 11 - 51 U/L 31 47 61 (H) 56 (H) 28     2035:  Pt is asking for dilaudid by name to tx his pain in the ED.  [place] PMP Database accessed: pt has been receiving suboxone regularly, with last rx filled 12/28/2018; benzos last filled 09/2018 (clonazepam 0.5mg  tabs, #10). Pt not forthcoming regarding suboxone medication on last few ED visits and hospital admission. ED RN today added this medication to his meds list.   2300:  Lipase improved. Pt already has had 2 CT scans in the past few weeks, will not re-scan at this time. Pt  has tol PO well while in the ED without N/V.  No stooling while in  the ED. Feels mildly nauseated but better, and wants to go home now. Will dose IM phenergan. Will not rx narcotics. Long hx of chronic abd pain with multiple ED visits for same.  Pt endorses acute flair of his usual long standing chronic pain today, no change from his usual chronic pain pattern.  Pt encouraged to f/u with his PMD and GI MD for good continuity of care and control of his chronic pain.  Pt verb understanding. Dx and testing d/w pt.  Questions answered.  Verb understanding, agreeable to d/c home with outpt f/u.               Final Clinical Impressions(s) / ED Diagnoses   Final diagnoses:  None    ED Discharge Orders    None       Samuel Jester, DO 01/10/19 1347

## 2019-01-07 NOTE — Discharge Instructions (Addendum)
Take the prescriptions as directed.  Increase your fluid intake (ie:  Gatoraide) for the next few days, as discussed.  Eat a bland diet and advance to your regular diet slowly as you can tolerate it. Eat a bland diet, avoiding greasy, fatty, fried foods, as well as spicy and acidic foods or beverages.  Avoid eating within 2 to 3 hours before going to bed or laying down.  Also avoid teas, colas, coffee, chocolate, pepermint and spearment. Try to stop smoking marijuana, as it can cause abdominal pain and nausea/vomiting in some individuals. Call your regular medical doctor and your GI doctor on Monday to schedule a follow up appointment within the next week.  Return to the Emergency Department immediately if worsening.

## 2019-01-07 NOTE — ED Triage Notes (Signed)
Pt reports pain in abdomen that radiates to back. Pt states he passed out 2 days ago and hit head. Pt seen 12/27/18 for same problem

## 2019-01-11 MED FILL — SUBOXONE 8 MG-2 MG SL FILM: 8-2 | 14 days supply | Qty: 42 | Fill #0

## 2019-01-23 ENCOUNTER — Emergency Department (HOSPITAL_COMMUNITY)
Admission: EM | Admit: 2019-01-23 | Discharge: 2019-01-24 | Discharge status: Against Medical Advice | Payer: Medicaid Other | Attending: Emergency Medicine | Admitting: Emergency Medicine

## 2019-01-23 ENCOUNTER — Encounter (HOSPITAL_COMMUNITY): Payer: Self-pay | Admitting: *Deleted

## 2019-01-23 ENCOUNTER — Other Ambulatory Visit: Payer: Self-pay

## 2019-01-23 DIAGNOSIS — R109 Unspecified abdominal pain: Secondary | ICD-10-CM

## 2019-01-23 DIAGNOSIS — E876 Hypokalemia: Secondary | ICD-10-CM | POA: Insufficient documentation

## 2019-01-23 DIAGNOSIS — G8929 Other chronic pain: Secondary | ICD-10-CM

## 2019-01-23 DIAGNOSIS — F419 Anxiety disorder, unspecified: Secondary | ICD-10-CM | POA: Insufficient documentation

## 2019-01-23 DIAGNOSIS — F329 Major depressive disorder, single episode, unspecified: Secondary | ICD-10-CM | POA: Insufficient documentation

## 2019-01-23 DIAGNOSIS — R1013 Epigastric pain: Secondary | ICD-10-CM | POA: Diagnosis present

## 2019-01-23 DIAGNOSIS — Z87891 Personal history of nicotine dependence: Secondary | ICD-10-CM | POA: Insufficient documentation

## 2019-01-23 DIAGNOSIS — Z79899 Other long term (current) drug therapy: Secondary | ICD-10-CM | POA: Diagnosis not present

## 2019-01-23 DIAGNOSIS — I10 Essential (primary) hypertension: Secondary | ICD-10-CM | POA: Insufficient documentation

## 2019-01-23 NOTE — ED Triage Notes (Signed)
Pt c/o abd pain that started 3 days ago with n/v, states that he was seen twice at [facility] yesterday and states " they just gave me a shot and would not give me a cat scan"

## 2019-01-24 LAB — CBC WITH DIFFERENTIAL/PLATELET
Abs Immature Granulocytes: 0.02 10*3/uL (ref 0.00–0.07)
Basophils Absolute: 0 10*3/uL (ref 0.0–0.1)
Basophils Relative: 0 %
Eosinophils Absolute: 0 10*3/uL (ref 0.0–0.5)
Eosinophils Relative: 0 %
HCT: 38.6 % — ABNORMAL LOW (ref 39.0–52.0)
Hemoglobin: 12.8 g/dL — ABNORMAL LOW (ref 13.0–17.0)
Immature Granulocytes: 0 %
Lymphocytes Relative: 25 %
Lymphs Abs: 2.2 10*3/uL (ref 0.7–4.0)
MCH: 30.8 pg (ref 26.0–34.0)
MCHC: 33.2 g/dL (ref 30.0–36.0)
MCV: 92.8 fL (ref 80.0–100.0)
MONO ABS: 0.5 10*3/uL (ref 0.1–1.0)
Monocytes Relative: 6 %
Neutro Abs: 6.1 10*3/uL (ref 1.7–7.7)
Neutrophils Relative %: 69 %
Platelets: 278 10*3/uL (ref 150–400)
RBC: 4.16 MIL/uL — AB (ref 4.22–5.81)
RDW: 12.9 % (ref 11.5–15.5)
WBC: 8.9 10*3/uL (ref 4.0–10.5)
nRBC: 0 % (ref 0.0–0.2)

## 2019-01-24 LAB — RAPID URINE DRUG SCREEN, HOSP PERFORMED
Amphetamines: NOT DETECTED
Barbiturates: NOT DETECTED
Benzodiazepines: POSITIVE — AB
Cocaine: NOT DETECTED
Opiates: NOT DETECTED
TETRAHYDROCANNABINOL: NOT DETECTED

## 2019-01-24 LAB — COMPREHENSIVE METABOLIC PANEL
ALT: 17 U/L (ref 0–44)
AST: 43 U/L — AB (ref 15–41)
Albumin: 4.2 g/dL (ref 3.5–5.0)
Alkaline Phosphatase: 61 U/L (ref 38–126)
Anion gap: 10 (ref 5–15)
BUN: 8 mg/dL (ref 6–20)
CO2: 21 mmol/L — ABNORMAL LOW (ref 22–32)
CREATININE: 0.85 mg/dL (ref 0.61–1.24)
Calcium: 8.7 mg/dL — ABNORMAL LOW (ref 8.9–10.3)
Chloride: 108 mmol/L (ref 98–111)
GFR calc Af Amer: >60 mL/min (ref >60)
GFR calc non Af Amer: >60 mL/min (ref >60)
Glucose, Bld: 135 mg/dL — ABNORMAL HIGH (ref 70–99)
Potassium: 2.6 mmol/L — CL (ref 3.5–5.1)
Sodium: 139 mmol/L (ref 135–145)
Total Bilirubin: 0.4 mg/dL (ref 0.3–1.2)
Total Protein: 7.4 g/dL (ref 6.5–8.1)

## 2019-01-24 LAB — LIPASE, BLOOD: Lipase: 38 U/L (ref 11–51)

## 2019-01-24 LAB — ETHANOL: Alcohol, Ethyl (B): <10 mg/dL (ref <10)

## 2019-01-24 MED ORDER — POTASSIUM CHLORIDE CRYS ER 20 MEQ PO TBCR
40.0000 meq | EXTENDED_RELEASE_TABLET | Freq: Once | ORAL | Status: DC
  Filled 2019-01-24: qty 2

## 2019-01-24 MED ORDER — SODIUM CHLORIDE 0.9 % IV BOLUS
1000.0000 mL | Freq: Once | INTRAVENOUS | Status: AC
  Administered 2019-01-24: 1000 mL via INTRAVENOUS

## 2019-01-24 MED ORDER — HALOPERIDOL LACTATE 5 MG/ML IJ SOLN
2.0000 mg | Freq: Once | INTRAMUSCULAR | Status: AC
  Administered 2019-01-24: 2 mg via INTRAVENOUS
  Filled 2019-01-24: qty 1

## 2019-01-24 MED ORDER — DIPHENHYDRAMINE HCL 50 MG/ML IJ SOLN
25.0000 mg | Freq: Once | INTRAMUSCULAR | Status: AC
  Administered 2019-01-24: 25 mg via INTRAVENOUS
  Filled 2019-01-24: qty 1

## 2019-01-24 MED ORDER — METOCLOPRAMIDE HCL 5 MG/ML IJ SOLN
10.0000 mg | Freq: Once | INTRAMUSCULAR | Status: AC
  Administered 2019-01-24: 10 mg via INTRAVENOUS
  Filled 2019-01-24: qty 2

## 2019-01-24 NOTE — ED Notes (Signed)
Date and time results received: 01/24/19 0027   Test: K+ Critical Value: 2.6  Name of Provider Notified: Devoria Albe MD

## 2019-01-24 NOTE — ED Provider Notes (Signed)
[facility] EMERGENCY DEPARTMENT Provider Note   CSN: [id number] Arrival date & time: 01/23/19  2326  Time seen 11:55 PM   History   Chief Complaint Chief Complaint  Patient presents with  . Abdominal Pain    HPI Alan Jennings is a 38 y.o. male.  HPI patient has a history of chronic abdominal pain.  He was last seen in the ED here on January 24.  He states he is followed by Dr. Renae Fickle, gastroenterologist, and has had endoscopy that shows erosions on his esophagus but no ulcers.  He states he is taking his Carafate 4 times a day and he is doubled up on his Protonix.  He states this episode started 3 days ago and he has pain in his epigastric area that radiates around his right upper quadrant into his right shoulder blade area.  He describes it as stabbing and burning.  He has had nausea and vomiting he states 12 times today.  He was seen earlier today at [facility] for the same thing.  He also states he is having "hunger cramps.  He denies diarrhea and states he has normal bowel movements.  He states he quit smoking 2 years ago and quit vaping 6 months ago.  He states his last alcohol was 3-4 months ago.  He states he is going to a new pain clinic on the 11th, he states he has chronic left shoulder pain and he has been treated with Suboxone which "is not a pain medicine and does not help my pain".  PCP Vertis Kelch, NP   Past Medical History:  Diagnosis Date  . Abdominal pain, chronic, epigastric   . Anxiety   . Arthritis   . Back pain   . Complication of anesthesia    had a syncopal episode after rhinoplasty  . Depression   . GERD (gastroesophageal reflux disease)   . Headache   . History of kidney stones   . Hypertension   . Neurogenic pain   . PTSD (post-traumatic stress disorder)   . Seizures (HCC)   . Seizures [facility])     Patient Active Problem List   Diagnosis Date Noted  . Pancreatitis, acute 12/27/2018  . Hematemesis without nausea 05/13/2018  . Abdominal pain,  chronic, epigastric 05/13/2018  . Gastroesophageal reflux disease without esophagitis 05/13/2018  . Abdominal pain, epigastric 05/13/2018  . Muscle weakness (generalized) 09/22/2012  . Neurogenic pain 08/03/2012  . Hypokalemia 06/03/2012  . Left shoulder pain 06/03/2012  . Wound infection after surgery 06/03/2012  . Status post shoulder surgery 05/11/2012  . Shoulder injury 11/13/2011  . Contusion of arm, left 10/08/2011  . Fracture of humerus, proximal, left, closed 08/27/2011  . Fracture, humerus 06/11/2011  . CLOSED FRACTURE UNSPEC PART UPPER END HUMERUS 02/27/2011    Past Surgical History:  Procedure Laterality Date  . BIOPSY  06/25/2018   Procedure: BIOPSY;  Surgeon: Malissa Hippo, MD;  Location: [facility] ENDO SUITE;  Service: Endoscopy;;  gastric  . ESOPHAGOGASTRODUODENOSCOPY (EGD) WITH PROPOFOL N/A 06/25/2018   Procedure: ESOPHAGOGASTRODUODENOSCOPY (EGD) WITH PROPOFOL;  Surgeon: Malissa Hippo, MD;  Location: [facility] ENDO SUITE;  Service: Endoscopy;  Laterality: N/A;  . NOSE SURGERY     [facility]  . ORIF HUMERUS FRACTURE  04/26/2012   Procedure: OPEN REDUCTION INTERNAL FIXATION (ORIF) PROXIMAL HUMERUS FRACTURE;  Surgeon: Vickki Hearing, MD;  Location: [facility] ORS;  Service: Orthopedics;  Laterality: Left;  Open Reduction Internal Fixation of Left Proximal Humerus Fracture, Closing Wedge Osteotomy, Bone  Graft  . RHINOPLASTY     [facility]  . SHOULDER SURGERY          Home Medications    Prior to Admission medications   Medication Sig Start Date End Date Taking? Authorizing Provider  acetaminophen (TYLENOL) 500 MG tablet Take 1,500 mg by mouth every 6 (six) hours as needed for moderate pain.   Yes [provider]  famotidine (PEPCID) 20 MG tablet Take 1 tablet (20 mg total) by mouth 2 (two) times daily. 01/07/19  Yes Samuel Jester, DO  pantoprazole (PROTONIX) 40 MG tablet Take 1 tablet (40 mg total) by mouth daily. One tab 30 minutes before breakfast and one tab 30 minutes before  supper. Patient taking differently: Take 80 mg by mouth 2 (two) times daily before a meal. One tab 30 minutes before breakfast and one tab 30 minutes before supper. 05/12/18  Yes Setzer, Terri L, NP  sucralfate (CARAFATE) 1 GM/10ML suspension Take 10 mLs (1 g total) by mouth 4 (four) times daily. 06/25/18 06/25/19 Yes Rehman, Joline Maxcy, MD  buprenorphine-naloxone (SUBOXONE) 8-2 mg SUBL SL tablet Place 2 tablets under the tongue daily. Qty 42 last filled 12/28/2018 12/28/18   [provider]  HYDROcodone-acetaminophen (NORCO/VICODIN) 5-325 MG tablet Take 1 tablet by mouth every 6 (six) hours as needed for moderate pain. 12/25/18   Bethann Berkshire, MD  ondansetron (ZOFRAN) 4 MG tablet Take 1 tablet (4 mg total) by mouth every 6 (six) hours as needed. Patient not taking: Reported on 01/07/2019 12/22/18   Dione Booze, MD  promethazine (PHENERGAN) 25 MG tablet Take 1 tablet (25 mg total) by mouth every 6 (six) hours as needed for nausea or vomiting. 01/07/19   Samuel Jester, DO    Family History Family History  Problem Relation Age of Onset  . Heart disease Other   . Arthritis Other   . Anesthesia problems Neg Hx   . Hypotension Neg Hx   . Malignant hyperthermia Neg Hx   . Pseudochol deficiency Neg Hx     Social History Social History   Tobacco Use  . Smoking status: Former Smoker    Packs/day: 0.50    Years: 7.00    Pack years: 3.50    Types: E-cigarettes  . Smokeless tobacco: Never Used  Substance Use Topics  . Alcohol use: No  . Drug use: Not Currently    Types: Marijuana  On disability for PTSD   Allergies   Tramadol and Nsaids   Review of Systems Review of Systems  All other systems reviewed and are negative.    Physical Exam Updated Vital Signs BP (!) 152/99   Pulse 97   Temp 97.9 F (36.6 C) (Oral)   Resp 18   Ht 5\' 8"  (1.727 m)   Wt 68.4 kg   SpO2 100%   BMI 22.93 kg/m   Vital signs normal    Physical Exam Vitals signs and nursing note reviewed.    Constitutional:      General: He is not in acute distress.    Appearance: Normal appearance. He is well-developed. He is not ill-appearing or toxic-appearing.  HENT:     Head: Normocephalic and atraumatic.     Right Ear: External ear normal.     Left Ear: External ear normal.     Nose: Nose normal. No mucosal edema or rhinorrhea.     Mouth/Throat:     Dentition: No dental abscesses.     Pharynx: No uvula swelling.  Eyes:  Conjunctiva/sclera: Conjunctivae normal.     Pupils: Pupils are equal, round, and reactive to light.  Neck:     Musculoskeletal: Full passive range of motion without pain, normal range of motion and neck supple.  Cardiovascular:     Rate and Rhythm: Normal rate and regular rhythm.     Heart sounds: Normal heart sounds. No murmur. No friction rub. No gallop.   Pulmonary:     Effort: Pulmonary effort is normal. No respiratory distress.     Breath sounds: Normal breath sounds. No wheezing, rhonchi or rales.  Chest:     Chest wall: No tenderness or crepitus.  Abdominal:     General: Bowel sounds are normal. There is no distension.     Palpations: Abdomen is soft.     Tenderness: There is abdominal tenderness in the epigastric area. There is no guarding or rebound.  Musculoskeletal: Normal range of motion.        General: No swelling or tenderness.     Comments: Moves all extremities well.   Skin:    General: Skin is warm and dry.     Capillary Refill: Capillary refill takes less than 2 seconds.     Coloration: Skin is not pale.     Findings: No erythema or rash.  Neurological:     General: No focal deficit present.     Mental Status: He is alert and oriented to person, place, and time.     Cranial Nerves: No cranial nerve deficit.  Psychiatric:        Mood and Affect: Mood normal. Mood is not anxious.        Speech: Speech normal.        Behavior: Behavior normal.        Thought Content: Thought content normal.      ED Treatments / Results   Labs (all labs ordered are listed, but only abnormal results are displayed) Results for orders placed or performed during the hospital encounter of 01/23/19  Comprehensive metabolic panel  Result Value Ref Range   Sodium 139 135 - 145 mmol/L   Potassium 2.6 (LL) 3.5 - 5.1 mmol/L   Chloride 108 98 - 111 mmol/L   CO2 21 (L) 22 - 32 mmol/L   Glucose, Bld 135 (H) 70 - 99 mg/dL   BUN 8 6 - 20 mg/dL   Creatinine, Ser 7.820.85 0.61 - 1.24 mg/dL   Calcium 8.7 (L) 8.9 - 10.3 mg/dL   Total Protein 7.4 6.5 - 8.1 g/dL   Albumin 4.2 3.5 - 5.0 g/dL   AST 43 (H) 15 - 41 U/L   ALT 17 0 - 44 U/L   Alkaline Phosphatase 61 38 - 126 U/L   Total Bilirubin 0.4 0.3 - 1.2 mg/dL   GFR calc non Af Amer >60 >60 mL/min   GFR calc Af Amer >60 >60 mL/min   Anion gap 10 5 - 15  Ethanol  Result Value Ref Range   Alcohol, Ethyl (B) <10 <10 mg/dL  CBC with Differential  Result Value Ref Range   WBC 8.9 4.0 - 10.5 K/uL   RBC 4.16 (L) 4.22 - 5.81 MIL/uL   Hemoglobin 12.8 (L) 13.0 - 17.0 g/dL   HCT 95.638.6 (L) 21.339.0 - 08.652.0 %   MCV 92.8 80.0 - 100.0 fL   MCH 30.8 26.0 - 34.0 pg   MCHC 33.2 30.0 - 36.0 g/dL   RDW 57.812.9 46.911.5 - 62.915.5 %   Platelets 278 150 - 400 K/uL  nRBC 0.0 0.0 - 0.2 %   Neutrophils Relative % 69 %   Neutro Abs 6.1 1.7 - 7.7 K/uL   Lymphocytes Relative 25 %   Lymphs Abs 2.2 0.7 - 4.0 K/uL   Monocytes Relative 6 %   Monocytes Absolute 0.5 0.1 - 1.0 K/uL   Eosinophils Relative 0 %   Eosinophils Absolute 0.0 0.0 - 0.5 K/uL   Basophils Relative 0 %   Basophils Absolute 0.0 0.0 - 0.1 K/uL   Immature Granulocytes 0 %   Abs Immature Granulocytes 0.02 0.00 - 0.07 K/uL  Lipase, blood  Result Value Ref Range   Lipase 38 11 - 51 U/L  Urine rapid drug screen (hosp performed)  Result Value Ref Range   Opiates NONE DETECTED NONE DETECTED   Cocaine NONE DETECTED NONE DETECTED   Benzodiazepines POSITIVE (A) NONE DETECTED   Amphetamines NONE DETECTED NONE DETECTED   Tetrahydrocannabinol NONE DETECTED  NONE DETECTED   Barbiturates NONE DETECTED NONE DETECTED    Laboratory interpretation all normal except hypokalemia   EKG None  Radiology No results found.   Ct Abdomen Pelvis W Contrast  Result Date: 12/27/2018 CLINICAL DATA:  History pancreatitis.  First episode.  IMPRESSION: 1. Small amount of fluid adjacent the pancreatic head and gallbladder, likely related to the clinical history of pancreatitis. No acute complication. 2. No evidence of choledocholithiasis or biliary duct dilatation. 3.  Aortic Atherosclerosis (ICD10-I70.0).  This is age advanced. Electronically Signed   By: Jeronimo Greaves M.D.   On: 12/27/2018 07:25   Ct Abdomen Pelvis W Contrast  Result Date: 12/25/2018 CLINICAL DATA:  Abdominal pain, nausea, vomiting x 5 days. No hx of abdominal surgery/IMPRESSION: No acute findings. Electronically Signed   By: Corlis Leak M.D.   On: 12/25/2018 09:58   Dg Abd Acute W/chest  Result Date: 01/07/2019 CLINICAL DATA:  Mid abdominal pain radiating to the back for a month. Nausea, vomiting and diarrhea.IMPRESSION: Negative abdominal radiographs.  No acute cardiopulmonary disease. Electronically Signed   By: Tollie Eth M.D.   On: 01/07/2019 21:09    Procedures Procedures (including critical care time)  Medications Ordered in ED Medications  potassium chloride SA (K-DUR,KLOR-CON) CR tablet 40 mEq (40 mEq Oral Refused 01/24/19 0100)  sodium chloride 0.9 % bolus 1,000 mL (0 mLs Intravenous Stopped 01/24/19 0101)  sodium chloride 0.9 % bolus 1,000 mL (0 mLs Intravenous Stopped 01/24/19 0101)  haloperidol lactate (HALDOL) injection 2 mg (2 mg Intravenous Given 01/24/19 0021)  metoCLOPramide (REGLAN) injection 10 mg (10 mg Intravenous Given 01/24/19 0023)  diphenhydrAMINE (BENADRYL) injection 25 mg (25 mg Intravenous Given 01/24/19 0022)     Initial Impression / Assessment and Plan / ED Course  I have reviewed the triage vital signs and the nursing notes.  Pertinent labs & imaging  results that were available during my care of the patient were reviewed by me and considered in my medical decision making (see chart for details).     When I look at his x-rays he had a acute abdominal series done today at [facility] ED that was without acute changes.  He had a ultrasound of his right upper quadrant done January 27 at [facility] that was without acute changes.  Patient was given IV fluids, IV nausea medications and he was given Haldol for his chronic abdominal pain.  After reviewing his laboratory test results he was given oral potassium.  Around 1 AM nursing staff reports patient wants to leave.  I went to talk  to him about his low potassium.  He states he knows about the low potassium.  He states he has potassium pills at the pharmacy that he needs to pick up.  He states "I will just eat some extra bananas".  Patient is insistent he wants to leave.  He was informed he can have cardiac arrhythmias including death.  He states he was aware of that.  Final Clinical Impressions(s) / ED Diagnoses   Final diagnoses:  Chronic abdominal pain  Hypokalemia    Pt left AMA  Devoria Albe, MD, Concha Pyo, MD 01/24/19 [phone]

## 2019-01-24 NOTE — ED Notes (Signed)
Pt wanting something else for pain. States what we gave him "didn't work". Explained to pt that he had to give medication time to work. Pt continues to get out of bed and stagger around despite being told to call for assistance when he needs to get up.

## 2019-01-25 MED FILL — SUBOXONE 8 MG-2 MG SL FILM: 8-2 | 14 days supply | Qty: 42 | Fill #0

## 2019-02-08 MED FILL — SUBOXONE 8 MG-2 MG SL FILM: 8-2 | 14 days supply | Qty: 42 | Fill #0

## 2019-02-22 MED FILL — SUBOXONE 8 MG-2 MG SL FILM: 8-2 | 14 days supply | Qty: 42 | Fill #0

## 2019-03-08 MED FILL — SUBOXONE 8 MG-2 MG SL FILM: 8-2 | 14 days supply | Qty: 42 | Fill #0

## 2019-03-22 MED FILL — SUBOXONE 8 MG-2 MG SL FILM: 8-2 | 28 days supply | Qty: 84 | Fill #0

## 2019-04-19 MED FILL — SUBOXONE 8 MG-2 MG SL FILM: 8-2 | 28 days supply | Qty: 84 | Fill #0

## 2019-04-29 IMAGING — DX DG CHEST 2V
2 series · 2 of 2 positions shown · non-contrast
Comparison: chest radiograph 08/19/2017

CLINICAL DATA: Hematemesis

EXAM:
CHEST - 2 VIEW

[chest pa]
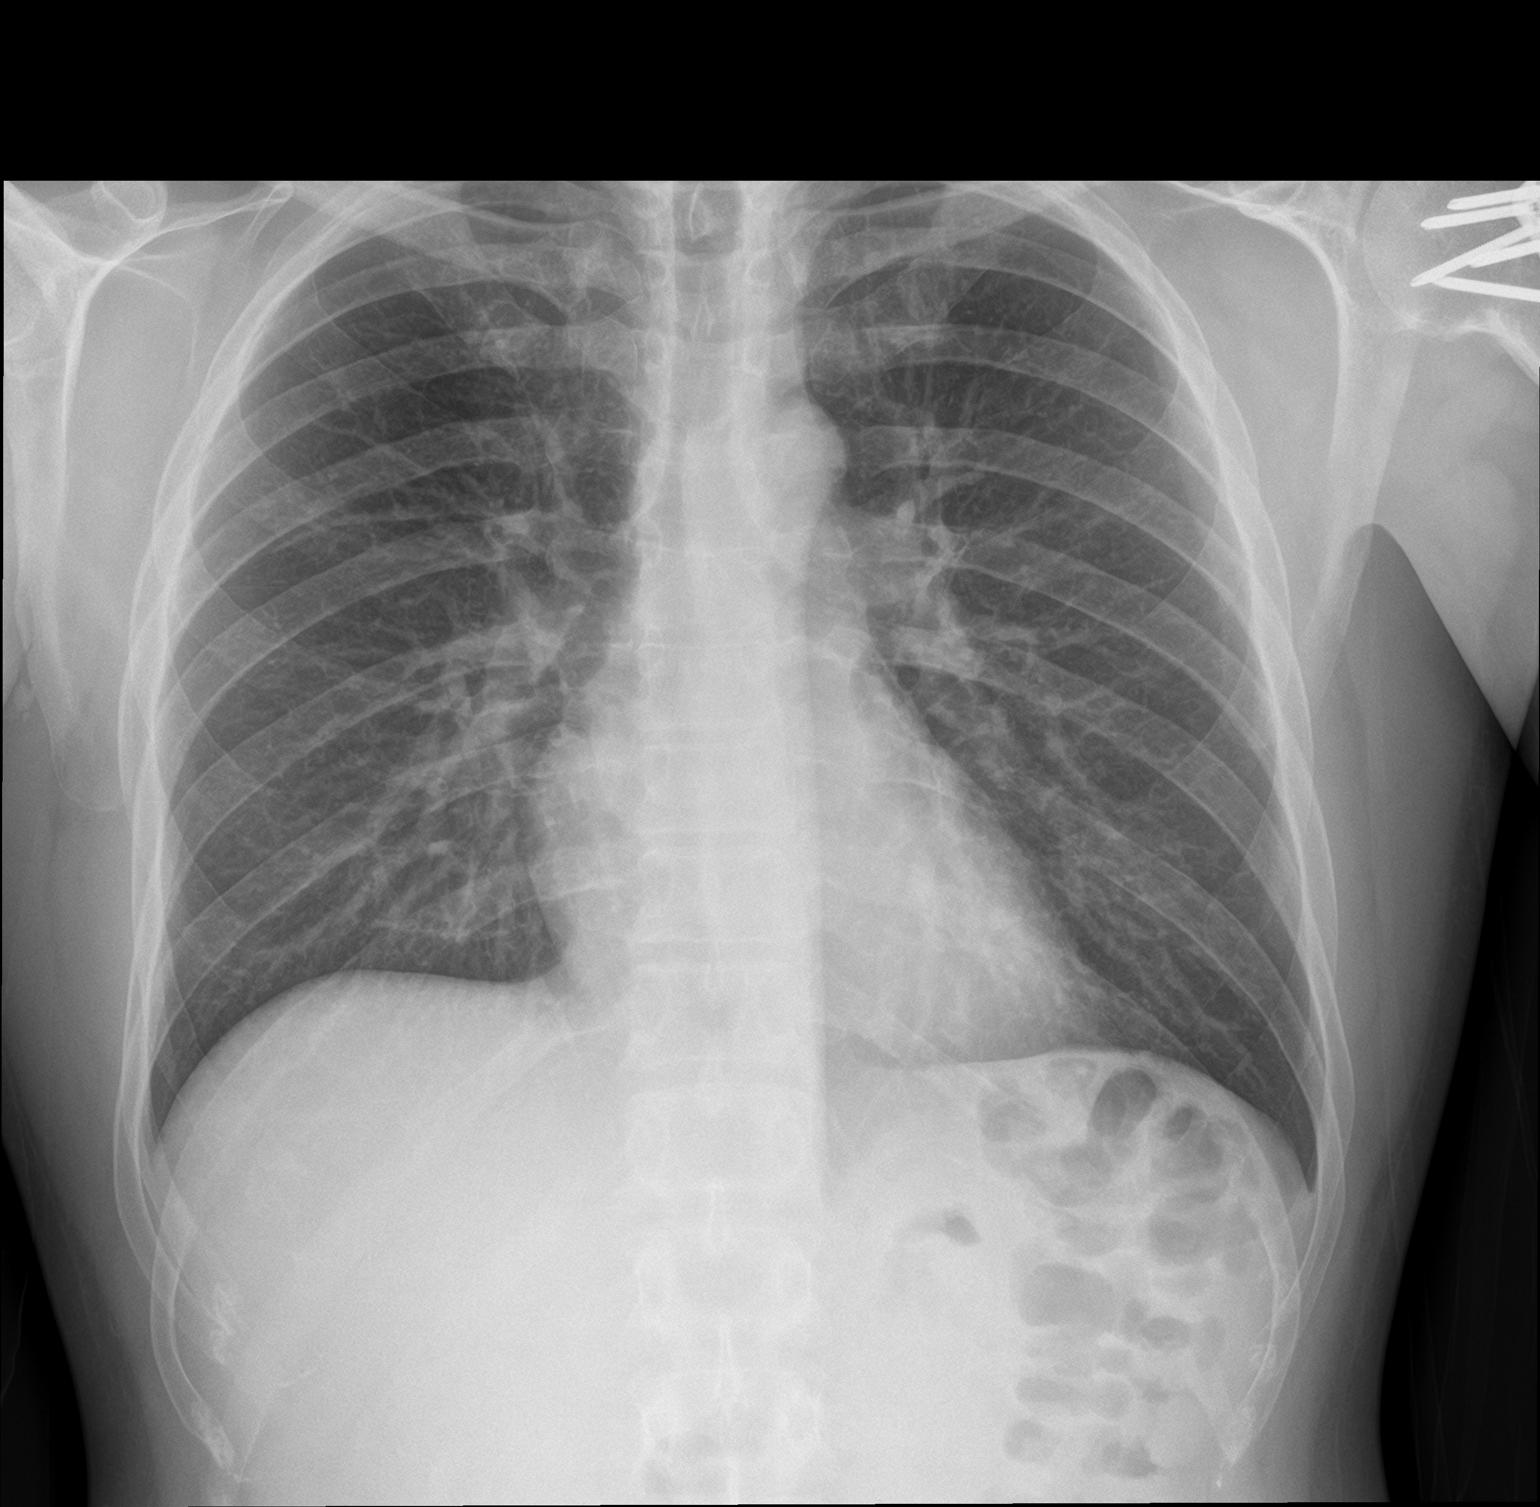

[chest lat]
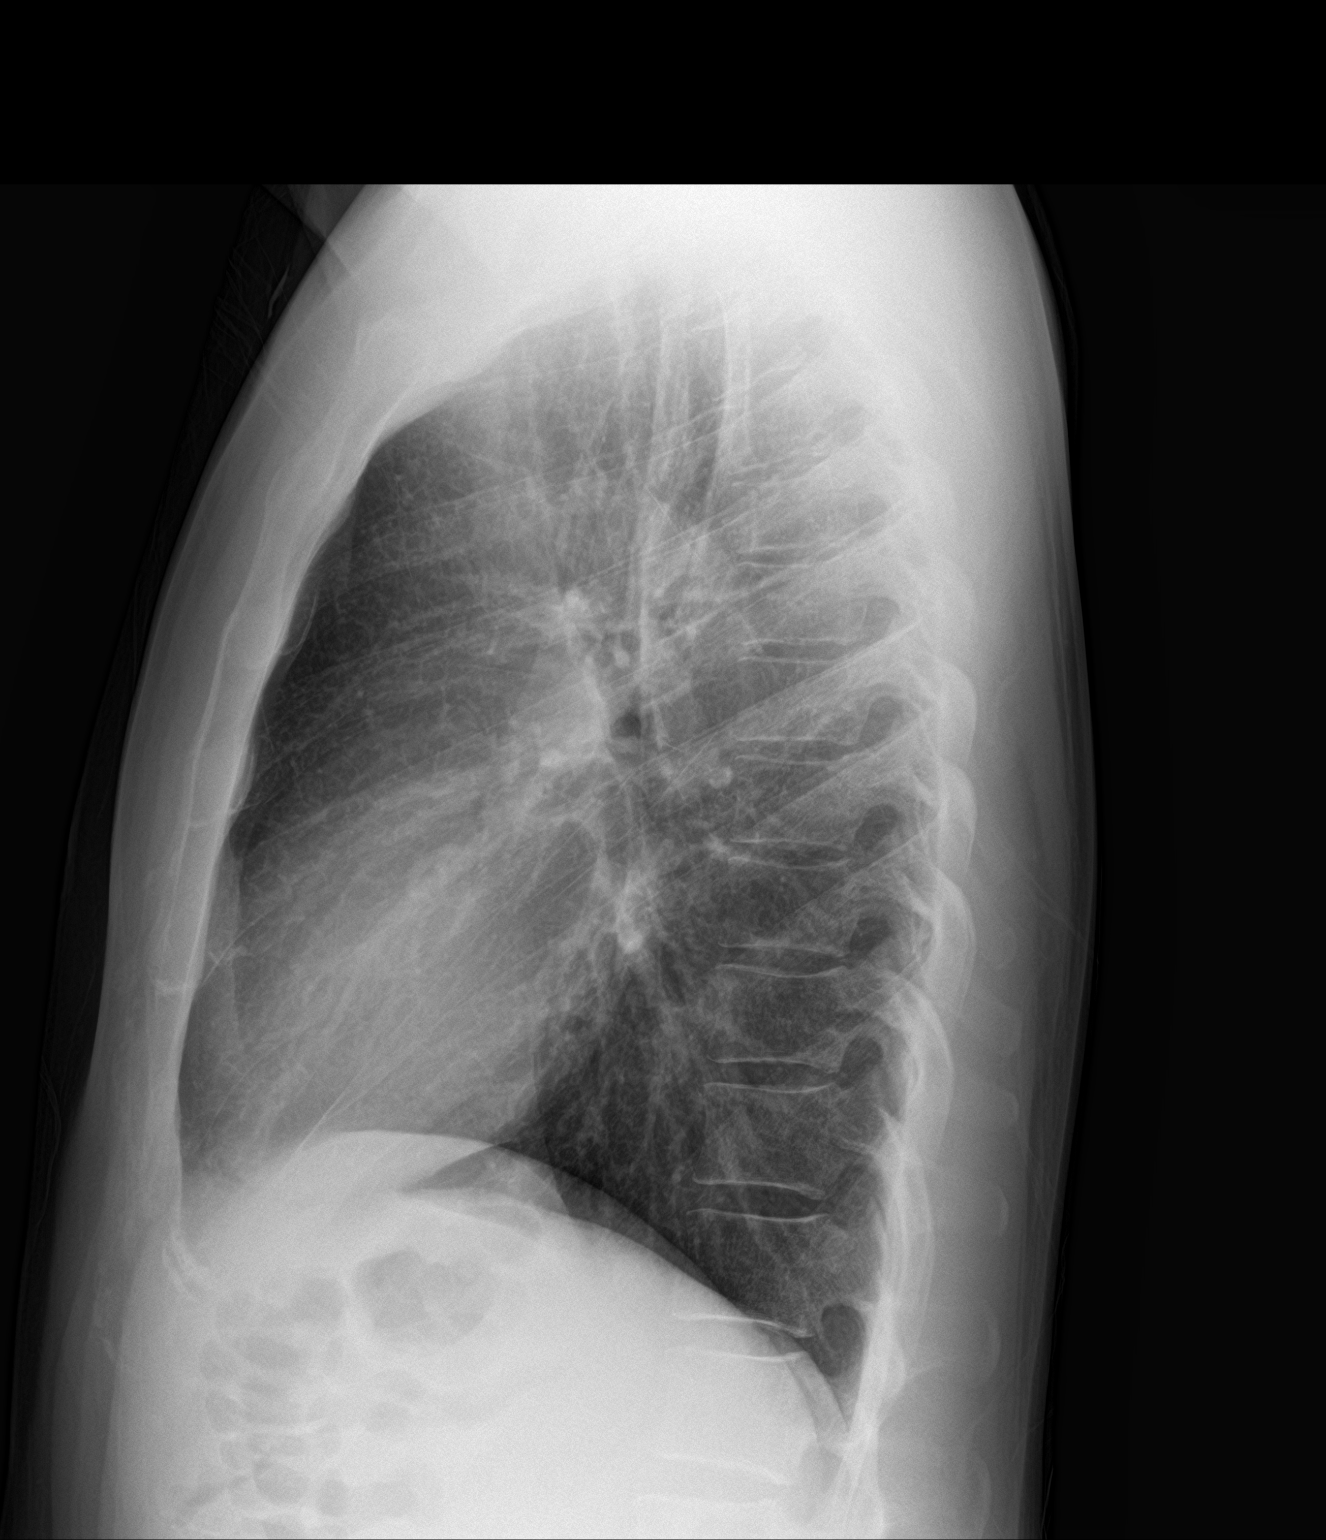

[2 of 2 positions shown; findings below may reference images not displayed]

FINDINGS: The heart size and mediastinal contours are within normal limits.
Both lungs are clear. The visualized skeletal structures are
unremarkable.
IMPRESSION: No active cardiopulmonary disease.

## 2019-05-16 ENCOUNTER — Encounter (HOSPITAL_COMMUNITY): Payer: Self-pay | Admitting: Emergency Medicine

## 2019-05-16 ENCOUNTER — Other Ambulatory Visit: Payer: Self-pay

## 2019-05-16 ENCOUNTER — Observation Stay (HOSPITAL_COMMUNITY)
Admission: EM | Admit: 2019-05-16 | Discharge: 2019-05-16 | Discharge status: Against Medical Advice | Payer: Medicaid Other | Attending: Family Medicine | Admitting: Family Medicine

## 2019-05-16 ENCOUNTER — Emergency Department (HOSPITAL_COMMUNITY): Payer: Medicaid Other

## 2019-05-16 DIAGNOSIS — K922 Gastrointestinal hemorrhage, unspecified: Secondary | ICD-10-CM

## 2019-05-16 DIAGNOSIS — R1110 Vomiting, unspecified: Principal | ICD-10-CM | POA: Insufficient documentation

## 2019-05-16 DIAGNOSIS — R1013 Epigastric pain: Secondary | ICD-10-CM | POA: Insufficient documentation

## 2019-05-16 DIAGNOSIS — Z8249 Family history of ischemic heart disease and other diseases of the circulatory system: Secondary | ICD-10-CM | POA: Insufficient documentation

## 2019-05-16 DIAGNOSIS — Z1159 Encounter for screening for other viral diseases: Secondary | ICD-10-CM | POA: Insufficient documentation

## 2019-05-16 DIAGNOSIS — Z885 Allergy status to narcotic agent status: Secondary | ICD-10-CM | POA: Insufficient documentation

## 2019-05-16 DIAGNOSIS — Z888 Allergy status to other drugs, medicaments and biological substances status: Secondary | ICD-10-CM | POA: Diagnosis not present

## 2019-05-16 DIAGNOSIS — K920 Hematemesis: Secondary | ICD-10-CM | POA: Diagnosis present

## 2019-05-16 DIAGNOSIS — K219 Gastro-esophageal reflux disease without esophagitis: Secondary | ICD-10-CM | POA: Diagnosis not present

## 2019-05-16 DIAGNOSIS — I10 Essential (primary) hypertension: Secondary | ICD-10-CM | POA: Diagnosis not present

## 2019-05-16 DIAGNOSIS — Z87891 Personal history of nicotine dependence: Secondary | ICD-10-CM | POA: Insufficient documentation

## 2019-05-16 DIAGNOSIS — F4310 Post-traumatic stress disorder, unspecified: Secondary | ICD-10-CM | POA: Insufficient documentation

## 2019-05-16 DIAGNOSIS — Z79899 Other long term (current) drug therapy: Secondary | ICD-10-CM | POA: Diagnosis not present

## 2019-05-16 DIAGNOSIS — F329 Major depressive disorder, single episode, unspecified: Secondary | ICD-10-CM | POA: Insufficient documentation

## 2019-05-16 DIAGNOSIS — Z886 Allergy status to analgesic agent status: Secondary | ICD-10-CM | POA: Diagnosis not present

## 2019-05-16 DIAGNOSIS — R55 Syncope and collapse: Secondary | ICD-10-CM | POA: Insufficient documentation

## 2019-05-16 HISTORY — DX: Peptic ulcer, site unspecified, unspecified as acute or chronic, without hemorrhage or perforation: K27.9

## 2019-05-16 HISTORY — DX: Gastrointestinal hemorrhage, unspecified: K92.2

## 2019-05-16 LAB — HEMOGLOBIN AND HEMATOCRIT, BLOOD
HCT: 31 % — ABNORMAL LOW (ref 39.0–52.0)
Hemoglobin: 9.1 g/dL — ABNORMAL LOW (ref 13.0–17.0)

## 2019-05-16 LAB — SARS CORONAVIRUS 2 BY RT PCR (HOSPITAL ORDER, PERFORMED IN ~~LOC~~ HOSPITAL LAB): SARS Coronavirus 2: NEGATIVE

## 2019-05-16 LAB — CBC
HCT: 31.6 % — ABNORMAL LOW (ref 39.0–52.0)
Hemoglobin: 9.3 g/dL — ABNORMAL LOW (ref 13.0–17.0)
MCH: 23.7 pg — ABNORMAL LOW (ref 26.0–34.0)
MCHC: 29.4 g/dL — ABNORMAL LOW (ref 30.0–36.0)
MCV: 80.4 fL (ref 80.0–100.0)
Platelets: 309 10*3/uL (ref 150–400)
RBC: 3.93 MIL/uL — ABNORMAL LOW (ref 4.22–5.81)
RDW: 16.9 % — ABNORMAL HIGH (ref 11.5–15.5)
WBC: 5.4 10*3/uL (ref 4.0–10.5)
nRBC: 0 % (ref 0.0–0.2)

## 2019-05-16 LAB — COMPREHENSIVE METABOLIC PANEL
ALT: 14 U/L (ref 0–44)
AST: 25 U/L (ref 15–41)
Albumin: 4.6 g/dL (ref 3.5–5.0)
Alkaline Phosphatase: 64 U/L (ref 38–126)
Anion gap: 15 (ref 5–15)
BUN: 12 mg/dL (ref 6–20)
CO2: 21 mmol/L — ABNORMAL LOW (ref 22–32)
Calcium: 9.3 mg/dL (ref 8.9–10.3)
Chloride: 102 mmol/L (ref 98–111)
Creatinine, Ser: 0.79 mg/dL (ref 0.61–1.24)
GFR calc Af Amer: >60 mL/min (ref >60)
GFR calc non Af Amer: >60 mL/min (ref >60)
Glucose, Bld: 99 mg/dL (ref 70–99)
Potassium: 4 mmol/L (ref 3.5–5.1)
Sodium: 138 mmol/L (ref 135–145)
Total Bilirubin: 0.1 mg/dL — ABNORMAL LOW (ref 0.3–1.2)
Total Protein: 8 g/dL (ref 6.5–8.1)

## 2019-05-16 LAB — DIFFERENTIAL
Abs Immature Granulocytes: 0.01 10*3/uL (ref 0.00–0.07)
Basophils Absolute: 0.1 10*3/uL (ref 0.0–0.1)
Basophils Relative: 1 %
Eosinophils Absolute: 0 10*3/uL (ref 0.0–0.5)
Eosinophils Relative: 1 %
Immature Granulocytes: 0 %
Lymphocytes Relative: 35 %
Lymphs Abs: 1.9 10*3/uL (ref 0.7–4.0)
Monocytes Absolute: 0.3 10*3/uL (ref 0.1–1.0)
Monocytes Relative: 6 %
Neutro Abs: 3.1 10*3/uL (ref 1.7–7.7)
Neutrophils Relative %: 57 %

## 2019-05-16 LAB — URINALYSIS, ROUTINE W REFLEX MICROSCOPIC
Bilirubin Urine: NEGATIVE
Glucose, UA: NEGATIVE mg/dL
Hgb urine dipstick: NEGATIVE
Ketones, ur: NEGATIVE mg/dL
Leukocytes,Ua: NEGATIVE
Nitrite: NEGATIVE
Protein, ur: NEGATIVE mg/dL
Specific Gravity, Urine: 1.003 — ABNORMAL LOW (ref 1.005–1.030)
pH: 8 (ref 5.0–8.0)

## 2019-05-16 LAB — TYPE AND SCREEN
ABO/RH(D): A POS
Antibody Screen: NEGATIVE

## 2019-05-16 LAB — LIPASE, BLOOD: Lipase: 71 U/L — ABNORMAL HIGH (ref 11–51)

## 2019-05-16 MED ORDER — HYDROMORPHONE HCL 1 MG/ML IJ SOLN
1.0000 mg | Freq: Once | INTRAMUSCULAR | Status: AC
  Administered 2019-05-16: 1 mg via INTRAVENOUS
  Filled 2019-05-16: qty 1

## 2019-05-16 MED ORDER — IOHEXOL 300 MG/ML  SOLN
100.0000 mL | Freq: Once | INTRAMUSCULAR | Status: AC | PRN
  Administered 2019-05-16: 100 mL via INTRAVENOUS

## 2019-05-16 MED ORDER — ONDANSETRON HCL 4 MG/2ML IJ SOLN
4.0000 mg | Freq: Once | INTRAMUSCULAR | Status: AC
  Administered 2019-05-16: 4 mg via INTRAVENOUS
  Filled 2019-05-16: qty 2

## 2019-05-16 MED ORDER — HYDROMORPHONE HCL 1 MG/ML IJ SOLN
0.5000 mg | Freq: Once | INTRAMUSCULAR | Status: AC
  Administered 2019-05-16: 0.5 mg via INTRAVENOUS
  Filled 2019-05-16: qty 1

## 2019-05-16 MED ORDER — PANTOPRAZOLE SODIUM 40 MG IV SOLR
40.0000 mg | Freq: Once | INTRAVENOUS | Status: AC
  Administered 2019-05-16: 40 mg via INTRAVENOUS
  Filled 2019-05-16: qty 40

## 2019-05-16 MED ORDER — SODIUM CHLORIDE 0.9 % IV BOLUS
1000.0000 mL | Freq: Once | INTRAVENOUS | Status: AC
  Administered 2019-05-16: 07:00:00 1000 mL via INTRAVENOUS

## 2019-05-16 NOTE — ED Provider Notes (Signed)
Patient CT of the abdomen showed evidence of constipation.  By history patient's had multiple episodes of coffee-ground emesis with some syncope over the past 3 days.  Does have pain in the epigastric radiating to his back area.  Has required pain medication for this.  Patient hemodynamically stable.  Discussed with Dr. Delton Prairie at [facility].  From GI medicine.  They did not have any beds.  But they do recommend that he be admitted.  They were planning to re-scope him.  Discussed with Dr. Darrick Penna here.  She is okay with admission here does not feel he needs to be rescoped unless he has red blood that he vomits or red blood per rectum or has melena.  Hemodynamically stable hemoglobin seems to be baseline stable for him.  Discussed with hospitalist they will admit here.  Patient has that history ulcer which required embolization to the end of March.  Patient has not been taking any NSAIDs.  Patient's pain pattern is consistent with either esophagitis or peptic ulcer disease.   Vanetta Mulders, MD 05/16/19 1243

## 2019-05-16 NOTE — ED Notes (Signed)
Patient left AMA. IV was removed by patient and left on counter in room.

## 2019-05-16 NOTE — ED Notes (Signed)
ED TO INPATIENT HANDOFF REPORT  ED Nurse Name and Phone #: Judie Grieve [phone]  S Name/Age/Gender Alan Jennings 38 y.o. male Room/Bed: APA16A/APA16A  Code Status   Code Status: Prior  Home/SNF/Other Home Patient oriented to: self, place, time and situation Is this baseline? Yes   Triage Complete: Triage complete  Chief Complaint Abdominal pain  Triage Note Pt to ED c/o of abd pain x3 days. Hx of ulcers. Says that he is starting to vomit black blood again.   Allergies Allergies  Allergen Reactions  . Tramadol Other (See Comments)    SEIZURES  . Nsaids     Has caused ulcers and needs to avoid.    Level of Care/Admitting Diagnosis ED Disposition    ED Disposition Condition Comment   Admit  Hospital Area: [facility] [[id number]]  Level of Care: Med-Surg [16]  Covid Evaluation: N/A  Diagnosis: Coffee ground emesis [191478]  Admitting Physician: Marylyn Ishihara  Attending Physician: Marylyn Ishihara  Bed request comments: med  PT Class (Do Not Modify): Observation [104]  PT Acc Code (Do Not Modify): Observation [10022]       B Medical/Surgery History Past Medical History:  Diagnosis Date  . Abdominal pain, chronic, epigastric   . Anxiety   . Arthritis   . Back pain   . Complication of anesthesia    had a syncopal episode after rhinoplasty  . Depression   . GERD (gastroesophageal reflux disease)   . GI bleed   . Headache   . History of kidney stones   . Hypertension   . Neurogenic pain   . Peptic ulcer disease   . PTSD (post-traumatic stress disorder)   . Seizures (HCC)   . Seizures (HCC)    Past Surgical History:  Procedure Laterality Date  . BIOPSY  06/25/2018   Procedure: BIOPSY;  Surgeon: Malissa Hippo, MD;  Location: [facility];  Service: Endoscopy;;  gastric  . ESOPHAGOGASTRODUODENOSCOPY (EGD) WITH PROPOFOL N/A 06/25/2018   Procedure: ESOPHAGOGASTRODUODENOSCOPY (EGD) WITH PROPOFOL;  Surgeon: Malissa Hippo, MD;   Location: [facility] END[facility] SUITE;  Service: Endoscopy;  Laterality: N/A;  . NOSE SURGERY     [facility]  . ORIF HUMERUS FRACTURE  04/26/2012   Procedure: OPEN REDUCTION INTERNAL FIXATION (ORIF) PROXIMAL HUMERUS FRACTURE;  Surgeon: Vickki Hearing, MD;  Location: [facility] ORS;  Service: Orthopedics;  Laterality: Left;  Open Reduction Internal Fixation of Left Proximal Humerus Fracture, Closing Wedge Osteotomy, Bone Graft  . RHINOPLASTY     [facility]  . SHOULDER SURGERY       A IV Location/Drains/Wounds Patient Lines/Drains/Airways Status   Active Line/Drains/Airways    Name:   Placement date:   Placement time:   Site:   Days:   Peripheral IV 05/16/19 Right Antecubital   05/16/19    0610    Antecubital   less than 1          Intake/Output Last 24 hours No intake or output data in the 24 hours ending 05/16/19 1504  Labs/Imaging Results for orders placed or performed during the hospital encounter of 05/16/19 (from the past 48 hour(s))  Lipase, blood     Status: Abnormal   Collection Time: 05/16/19  6:11 AM  Result Value Ref Range   Lipase 71 (H) 11 - 51 U/L    Comment: Performed at [facility], [address] [place], [place] [address]  Comprehensive metabolic panel     Status: Abnormal   Collection Time: 05/16/19  6:11  AM  Result Value Ref Range   Sodium 138 135 - 145 mmol/L   Potassium 4.0 3.5 - 5.1 mmol/L   Chloride 102 98 - 111 mmol/L   CO2 21 (L) 22 - 32 mmol/L   Glucose, Bld 99 70 - 99 mg/dL   BUN 12 6 - 20 mg/dL   Creatinine, Ser 1.610.79 0.61 - 1.24 mg/dL   Calcium 9.3 8.9 - 09.610.3 mg/dL   Total Protein 8.0 6.5 - 8.1 g/dL   Albumin 4.6 3.5 - 5.0 g/dL   AST 25 15 - 41 U/L   ALT 14 0 - 44 U/L   Alkaline Phosphatase 64 38 - 126 U/L   Total Bilirubin 0.1 (L) 0.3 - 1.2 mg/dL   GFR calc non Af Amer >60 >60 mL/min   GFR calc Af Amer >60 >60 mL/min   Anion gap 15 5 - 15    Comment: Performed at [facility], [address] [place], [place] [address]  CBC     Status: Abnormal   Collection Time:  05/16/19  6:11 AM  Result Value Ref Range   WBC 5.4 4.0 - 10.5 K/uL   RBC 3.93 (L) 4.22 - 5.81 MIL/uL   Hemoglobin 9.3 (L) 13.0 - 17.0 g/dL   HCT 98.131.6 (L) 19.139.0 - 47.852.0 %   MCV 80.4 80.0 - 100.0 fL   MCH 23.7 (L) 26.0 - 34.0 pg   MCHC 29.4 (L) 30.0 - 36.0 g/dL   RDW 29.516.9 (H) 62.111.5 - 30.815.5 %   Platelets 309 150 - 400 K/uL   nRBC 0.0 0.0 - 0.2 %    Comment: Performed at [facility], [address] [place], [place] [address]  Urinalysis, Routine w reflex microscopic     Status: Abnormal   Collection Time: 05/16/19  6:11 AM  Result Value Ref Range   Color, Urine COLORLESS (A) YELLOW   APPearance CLEAR CLEAR   Specific Gravity, Urine 1.003 (L) 1.005 - 1.030   pH 8.0 5.0 - 8.0   Glucose, UA NEGATIVE NEGATIVE mg/dL   Hgb urine dipstick NEGATIVE NEGATIVE   Bilirubin Urine NEGATIVE NEGATIVE   Ketones, ur NEGATIVE NEGATIVE mg/dL   Protein, ur NEGATIVE NEGATIVE mg/dL   Nitrite NEGATIVE NEGATIVE   Leukocytes,Ua NEGATIVE NEGATIVE    Comment: Performed at [facility], [address] [place], [place] [address]  Differential     Status: None   Collection Time: 05/16/19  6:11 AM  Result Value Ref Range   Neutrophils Relative % 57 %   Neutro Abs 3.1 1.7 - 7.7 K/uL   Lymphocytes Relative 35 %   Lymphs Abs 1.9 0.7 - 4.0 K/uL   Monocytes Relative 6 %   Monocytes Absolute 0.3 0.1 - 1.0 K/uL   Eosinophils Relative 1 %   Eosinophils Absolute 0.0 0.0 - 0.5 K/uL   Basophils Relative 1 %   Basophils Absolute 0.1 0.0 - 0.1 K/uL   Immature Granulocytes 0 %   Abs Immature Granulocytes 0.01 0.00 - 0.07 K/uL    Comment: Performed at [facility], [address] [place], [place] [address]  SARS Coronavirus 2 (CEPHEID- Performed in [facility] hospital lab), Hosp Order     Status: None   Collection Time: 05/16/19  6:48 AM  Result Value Ref Range   SARS Coronavirus 2 NEGATIVE NEGATIVE    Comment: (NOTE) If result is NEGATIVE SARS-CoV-2 target nucleic acids are NOT DETECTED. The SARS-CoV-2 RNA is  generally detectable in upper and lower  respiratory specimens  during the acute phase of infection. The lowest  concentration of SARS-CoV-2 viral copies this assay can detect is 250  copies / mL. A negative result does not preclude SARS-CoV-2 infection  and should not be used as the sole basis for treatment or other  patient management decisions.  A negative result may occur with  improper specimen collection / handling, submission of specimen other  than nasopharyngeal swab, presence of viral mutation(s) within the  areas targeted by this assay, and inadequate number of viral copies  (<250 copies / mL). A negative result must be combined with clinical  observations, patient history, and epidemiological information. If result is POSITIVE SARS-CoV-2 target nucleic acids are DETECTED. The SARS-CoV-2 RNA is generally detectable in upper and lower  respiratory specimens dur ing the acute phase of infection.  Positive  results are indicative of active infection with SARS-CoV-2.  Clinical  correlation with patient history and other diagnostic information is  necessary to determine patient infection status.  Positive results do  not rule out bacterial infection or co-infection with other viruses. If result is PRESUMPTIVE POSTIVE SARS-CoV-2 nucleic acids MAY BE PRESENT.   A presumptive positive result was obtained on the submitted specimen  and confirmed on repeat testing.  While 2019 novel coronavirus  (SARS-CoV-2) nucleic acids may be present in the submitted sample  additional confirmatory testing may be necessary for epidemiological  and / or clinical management purposes  to differentiate between  SARS-CoV-2 and other Sarbecovirus currently known to infect humans.  If clinically indicated additional testing with an alternate test  methodology [phone]) is advised. The SARS-CoV-2 RNA is generally  detectable in upper and lower respiratory sp ecimens during the acute  phase of  infection. The expected result is Negative. Fact Sheet for Patients:  BoilerBrush.com.cy Fact Sheet for Healthcare Providers: https://pope.com/ This test is not yet approved or cleared by the [place] FDA and has been authorized for detection and/or diagnosis of SARS-CoV-2 by FDA under an Emergency Use Authorization (EUA).  This EUA will remain in effect (meaning this test can be used) for the duration of the COVID-19 declaration under Section 564(b)(1) of the Act, 21 U.S.C. section 360bbb-3(b)(1), unless the authorization is terminated or revoked sooner. Performed at [facility], [address], [place], [place] [address]   Type and screen Ordered by PROVIDER DEFAULT     Status: None   Collection Time: 05/16/19  7:44 AM  Result Value Ref Range   ABO/RH(D) A POS    Antibody Screen NEG    Sample Expiration      05/19/2019,2359 Performed at [facility], [address], [place], [place] [address]   Hemoglobin and hematocrit, blood     Status: Abnormal   Collection Time: 05/16/19 10:18 AM  Result Value Ref Range   Hemoglobin 9.1 (L) 13.0 - 17.0 g/dL   HCT 16.9 (L) 45.0 - 38.8 %    Comment: Performed at [facility], [address] [place], [place] [address]   Ct Head Wo Contrast  Result Date: 05/16/2019 CLINICAL DATA:  Recent falls EXAM: CT HEAD WITHOUT CONTRAST TECHNIQUE: Contiguous axial images were obtained from the base of the skull through the vertex without intravenous contrast. COMPARISON:  03/13/2019 FINDINGS: Brain: No evidence of acute infarction, hemorrhage, hydrocephalus, extra-axial collection or mass lesion/mass effect. Vascular: No hyperdense vessel or unexpected calcification. Skull: Normal. Negative for fracture or focal lesion. Sinuses/Orbits: No acute finding. Other: None. IMPRESSION: No acute intracranial abnormality noted. Electronically Signed   By: Loraine Leriche  Lukens M.D.   On: 05/16/2019 07:46   Ct Abdomen Pelvis W  Contrast  Result Date: 05/16/2019 CLINICAL DATA:  Head trauma, epigastric pain and vomiting coffee-ground material. History of a bleeding ulcer. EXAM: CT ABDOMEN AND PELVIS WITH CONTRAST TECHNIQUE: Multidetector CT imaging of the abdomen and pelvis was performed using the standard protocol following bolus administration of intravenous contrast. CONTRAST:  OMNIPAQUE IOHEXOL 300 MG/ML  SOLN COMPARISON:  01/10/2019. FINDINGS: Lower chest: Lung bases are clear. Heart size normal. No pericardial or pleural effusion. Hepatobiliary: Liver and gallbladder are unremarkable. No biliary ductal dilatation. Pancreas: Negative. Spleen: Negative. Adrenals/Urinary Tract: Adrenal glands and kidneys are unremarkable. Ureters are decompressed. Bladder is grossly unremarkable. Stomach/Bowel: Stomach is fairly decompressed, likely contributing to apparent wall thickening. Vascular coils are seen in the region of the proximal duodenum. Small bowel is otherwise unremarkable. Appendix is not well-visualized. A fair amount of stool is seen in the colon. Vascular/Lymphatic: Vascular structures are unremarkable. No pathologically enlarged lymph nodes. Reproductive: Prostate is visualized. Other: Trace pelvic free fluid. Mesenteries and peritoneum are otherwise unremarkable. Musculoskeletal: No worrisome lytic or sclerotic lesions. IMPRESSION: 1. No acute findings to explain the patient's symptoms. 2. Trace pelvic free fluid. 3. Fair amount of stool in the colon is indicative of constipation. Electronically Signed   By: Leanna Battles M.D.   On: 05/16/2019 07:53    Pending Labs Unresulted Labs (From admission, onward)    Start     Ordered   05/16/19 0653  Occult blood card to lab, stool Provider will collect  Once,   STAT    Question:  Specimen to be collected by?  Answer:  Provider will collect   05/16/19 0653          Vitals/Pain Today's Vitals   05/16/19 1200 05/16/19 1230 05/16/19 1300 05/16/19 1336  BP: (!) 177/85  137/66 137/81   Pulse: 76  83   Resp:      Temp:      TempSrc:      SpO2: (!) 88%  100% 100%  Weight:      Height:      PainSc:  8       Isolation Precautions Droplet and Contact precautions  Medications Medications  sodium chloride 0.9 % bolus 1,000 mL (0 mLs Intravenous Stopped 05/16/19 0800)  pantoprazole (PROTONIX) injection 40 mg (40 mg Intravenous Given 05/16/19 0707)  ondansetron (ZOFRAN) injection 4 mg (4 mg Intravenous Given 05/16/19 0709)  iohexol (OMNIPAQUE) 300 MG/ML solution 100 mL (100 mLs Intravenous Contrast Given 05/16/19 0727)  HYDROmorphone (DILAUDID) injection 0.5 mg (0.5 mg Intravenous Given 05/16/19 0717)  HYDROmorphone (DILAUDID) injection 1 mg (1 mg Intravenous Given 05/16/19 1002)  HYDROmorphone (DILAUDID) injection 1 mg (1 mg Intravenous Given 05/16/19 1306)    Mobility walks Low fall risk   Focused Assessments GI   R Recommendations: See Admitting Provider Note  Report given to:   Additional Notes: Patient Alert x 4. No vomiting since arrival.

## 2019-05-16 NOTE — ED Provider Notes (Signed)
[facility] EMERGENCY DEPARTMENT Provider Note   CSN: [id number] Arrival date & time: 05/16/19  0541    History   Chief Complaint Chief Complaint  Patient presents with  . Abdominal Pain    HPI Alan Jennings is a 38 y.o. male.     Patient complains of epigastric pain and vomiting coffee-ground material for the last couple days.  Patient has a history of a bleeding ulcer that was taking care of over at [facility] couple months ago.  He also states that he has been feeling dizzy and has fallen and hit his head a couple days ago.  Patient complains of a headache  The history is provided by the patient.  Abdominal Pain  Pain location:  Epigastric Pain quality: aching   Pain radiates to:  Does not radiate Pain severity:  Moderate Onset quality:  Sudden Timing:  Constant Progression:  Worsening Chronicity:  Recurrent Context: not awakening from sleep   Relieved by:  Nothing Worsened by:  Nothing Ineffective treatments:  None tried Associated symptoms: anorexia   Associated symptoms: no chest pain, no cough, no diarrhea, no fatigue and no hematuria     Past Medical History:  Diagnosis Date  . Abdominal pain, chronic, epigastric   . Anxiety   . Arthritis   . Back pain   . Complication of anesthesia    had a syncopal episode after rhinoplasty  . Depression   . GERD (gastroesophageal reflux disease)   . GI bleed   . Headache   . History of kidney stones   . Hypertension   . Neurogenic pain   . Peptic ulcer disease   . PTSD (post-traumatic stress disorder)   . Seizures (HCC)   . Seizures [facility])     Patient Active Problem List   Diagnosis Date Noted  . Pancreatitis, acute 12/27/2018  . Hematemesis without nausea 05/13/2018  . Abdominal pain, chronic, epigastric 05/13/2018  . Gastroesophageal reflux disease without esophagitis 05/13/2018  . Abdominal pain, epigastric 05/13/2018  . Muscle weakness (generalized) 09/22/2012  . Neurogenic pain 08/03/2012  .  Hypokalemia 06/03/2012  . Left shoulder pain 06/03/2012  . Wound infection after surgery 06/03/2012  . Status post shoulder surgery 05/11/2012  . Shoulder injury 11/13/2011  . Contusion of arm, left 10/08/2011  . Fracture of humerus, proximal, left, closed 08/27/2011  . Fracture, humerus 06/11/2011  . CLOSED FRACTURE UNSPEC PART UPPER END HUMERUS 02/27/2011    Past Surgical History:  Procedure Laterality Date  . BIOPSY  06/25/2018   Procedure: BIOPSY;  Surgeon: Malissa Hippo, MD;  Location: [facility];  Service: Endoscopy;;  gastric  . ESOPHAGOGASTRODUODENOSCOPY (EGD) WITH PROPOFOL N/A 06/25/2018   Procedure: ESOPHAGOGASTRODUODENOSCOPY (EGD) WITH PROPOFOL;  Surgeon: Malissa Hippo, MD;  Location: [facility];  Service: Endoscopy;  Laterality: N/A;  . NOSE SURGERY     [facility]  . ORIF HUMERUS FRACTURE  04/26/2012   Procedure: OPEN REDUCTION INTERNAL FIXATION (ORIF) PROXIMAL HUMERUS FRACTURE;  Surgeon: Vickki Hearing, MD;  Location: [facility] ORS;  Service: Orthopedics;  Laterality: Left;  Open Reduction Internal Fixation of Left Proximal Humerus Fracture, Closing Wedge Osteotomy, Bone Graft  . RHINOPLASTY     MM[facility]  . SHOULDER SURGERY          Home Medications    Prior to Admission medications   Medication Sig Start Date End Date Taking? Authorizing Provider  acetaminophen (TYLENOL) 500 MG tablet Take 1,500 mg by mouth every 6 (six) hours as needed for  moderate pain.    [provider]  buprenorphine-naloxone (SUBOXONE) 8-2 mg SUBL SL tablet Place 2 tablets under the tongue daily. Qty 42 last filled 12/28/2018 12/28/18   [provider]  famotidine (PEPCID) 20 MG tablet Take 1 tablet (20 mg total) by mouth 2 (two) times daily. 01/07/19   Samuel Jester, DO  HYDROcodone-acetaminophen (NORCO/VICODIN) 5-325 MG tablet Take 1 tablet by mouth every 6 (six) hours as needed for moderate pain. 12/25/18   Bethann Berkshire, MD  ondansetron (ZOFRAN) 4 MG tablet Take 1 tablet (4 mg  total) by mouth every 6 (six) hours as needed. Patient not taking: Reported on 01/07/2019 12/22/18   Dione Booze, MD  pantoprazole (PROTONIX) 40 MG tablet Take 1 tablet (40 mg total) by mouth daily. One tab 30 minutes before breakfast and one tab 30 minutes before supper. Patient taking differently: Take 80 mg by mouth 2 (two) times daily before a meal. One tab 30 minutes before breakfast and one tab 30 minutes before supper. 05/12/18   Setzer, Brand Males, NP  promethazine (PHENERGAN) 25 MG tablet Take 1 tablet (25 mg total) by mouth every 6 (six) hours as needed for nausea or vomiting. 01/07/19   Samuel Jester, DO  sucralfate (CARAFATE) 1 GM/10ML suspension Take 10 mLs (1 g total) by mouth 4 (four) times daily. 06/25/18 06/25/19  Malissa Hippo, MD    Family History Family History  Problem Relation Age of Onset  . Heart disease Other   . Arthritis Other   . Anesthesia problems Neg Hx   . Hypotension Neg Hx   . Malignant hyperthermia Neg Hx   . Pseudochol deficiency Neg Hx     Social History Social History   Tobacco Use  . Smoking status: Former Smoker    Packs/day: 0.50    Years: 7.00    Pack years: 3.50    Types: E-cigarettes  . Smokeless tobacco: Never Used  Substance Use Topics  . Alcohol use: No  . Drug use: Not Currently    Types: Marijuana     Allergies   Tramadol and Nsaids   Review of Systems Review of Systems  Constitutional: Negative for appetite change and fatigue.  HENT: Negative for congestion, ear discharge and sinus pressure.   Eyes: Negative for discharge.  Respiratory: Negative for cough.   Cardiovascular: Negative for chest pain.  Gastrointestinal: Positive for abdominal pain and anorexia. Negative for diarrhea.  Genitourinary: Negative for frequency and hematuria.  Musculoskeletal: Negative for back pain.  Skin: Negative for rash.  Neurological: Negative for seizures and headaches.  Psychiatric/Behavioral: Negative for hallucinations.      Physical Exam Updated Vital Signs BP (!) 156/88 (BP Location: Right Arm)   Pulse 78   Temp 98.2 F (36.8 C) (Oral)   Resp 16   Ht  (1.727 m)   Wt 68.5 kg   SpO2 100%   BMI 22.96 kg/m   Physical Exam Constitutional:      Appearance: He is well-developed.  HENT:     Head: Normocephalic.  Eyes:     General: No scleral icterus.    Conjunctiva/sclera: Conjunctivae normal.  Neck:     Musculoskeletal: Neck supple.     Thyroid: No thyromegaly.  Cardiovascular:     Rate and Rhythm: Normal rate and regular rhythm.     Heart sounds: No murmur. No friction rub. No gallop.   Pulmonary:     Breath sounds: No stridor. No wheezing or rales.  Chest:  Chest wall: No tenderness.  Abdominal:     General: There is no distension.     Tenderness: There is no abdominal tenderness. There is no rebound.  Genitourinary:    Comments: Rectal exam done heme-negative Musculoskeletal: Normal range of motion.  Lymphadenopathy:     Cervical: No cervical adenopathy.  Skin:    Findings: No erythema or rash.  Neurological:     Mental Status: He is alert and oriented to person, place, and time.     Motor: No abnormal muscle tone.     Coordination: Coordination normal.  Psychiatric:        Behavior: Behavior normal.      ED Treatments / Results  Labs (all labs ordered are listed, but only abnormal results are displayed) Labs Reviewed  LIPASE, BLOOD - Abnormal; Notable for the following components:      Result Value   Lipase 71 (*)    All other components within normal limits  COMPREHENSIVE METABOLIC PANEL - Abnormal; Notable for the following components:   CO2 21 (*)    Total Bilirubin 0.1 (*)    All other components within normal limits  CBC - Abnormal; Notable for the following components:   RBC 3.93 (*)    Hemoglobin 9.3 (*)    HCT 31.6 (*)    MCH 23.7 (*)    MCHC 29.4 (*)    RDW 16.9 (*)    All other components within normal limits  SARS CORONAVIRUS 2 (HOSPITAL ORDER,  PERFORMED IN [facility])  DIFFERENTIAL  URINALYSIS, ROUTINE W REFLEX MICROSCOPIC  TYPE AND SCREEN    EKG None  Radiology No results found.  Procedures Procedures (including critical care time)  Medications Ordered in ED Medications  sodium chloride 0.9 % bolus 1,000 mL (has no administration in time range)  pantoprazole (PROTONIX) injection 40 mg (has no administration in time range)  ondansetron (ZOFRAN) injection 4 mg (has no administration in time range)     Initial Impression / Assessment and Plan / ED Course  I have reviewed the triage vital signs and the nursing notes.  Pertinent labs & imaging results that were available during my care of the patient were reviewed by me and considered in my medical decision making (see chart for details). CRITICAL CARE Performed by: Bethann BerkshireJoseph Azelie Noguera Total critical care time: 35 minutes Critical care time was exclusive of separately billable procedures and treating other patients. Critical care was necessary to treat or prevent imminent or life-threatening deterioration. Critical care was time spent personally by me on the following activities: development of treatment plan with patient and/or surrogate as well as nursing, discussions with consultants, evaluation of patient's response to treatment, examination of patient, obtaining history from patient or surrogate, ordering and performing treatments and interventions, ordering and review of laboratory studies, ordering and review of radiographic studies, pulse oximetry and re-evaluation of patient's condition.        Patient with epigastric pain and a history of bleeding ulcer.  Rectal exam done and no stool in vault.  He will be placed on Protonix and given pain and nausea medicine he will get a CT scan with abdomen along with a CT scan of his head.  [facility] and used to see Dr. Kelvin Cellarehmann Final Clinical Impressions(s) / ED Diagnoses    Final diagnoses:  None    ED Discharge Orders    None       Bethann BerkshireZammit, Shawntelle Ungar, MD 05/16/19  0651  

## 2019-05-16 NOTE — ED Triage Notes (Signed)
Pt to ED c/o of abd pain x3 days. Hx of ulcers. Says that he is starting to vomit black blood again.

## 2019-05-16 NOTE — ED Notes (Signed)
Tried calling patient on mobil and home phone numbers no answer. Mail box is full.

## 2019-05-16 NOTE — Progress Notes (Signed)
  EDP requested admission to the hospital for coffee-ground emesis and episode of passing out at home  Reviewed on initial bed request orders placed....  I have been notified by nursing staff that patient left AMA without to find the ED staff....  Patient left AMA. IV was removed by patient and left on counter in room.  ED staff Tried calling patient on mobile and home phone numbers no answer. Mail box is full.  Pt was not actually seen , nor evaluated by Hospitalist Physician.... As patient left the ED AMA prior to being seen  Shon Hale, MD

## 2019-05-17 MED FILL — SUBOXONE 8 MG-2 MG SL FILM: 8-2 | 28 days supply | Qty: 84 | Fill #0

## 2019-06-09 ENCOUNTER — Other Ambulatory Visit (INDEPENDENT_AMBULATORY_CARE_PROVIDER_SITE_OTHER): Payer: Self-pay | Admitting: Internal Medicine

## 2019-06-14 MED FILL — SUBOXONE 8 MG-2 MG SL FILM: 8-2 | 28 days supply | Qty: 84 | Fill #0

## 2019-07-12 MED FILL — SUBOXONE 8 MG-2 MG SL FILM: 8-2 | 28 days supply | Qty: 84 | Fill #0

## 2019-08-09 MED FILL — SUBOXONE 8 MG-2 MG SL FILM: 8-2 | 28 days supply | Qty: 84 | Fill #0

## 2019-09-06 MED FILL — SUBOXONE 8 MG-2 MG SL FILM: 8-2 | 28 days supply | Qty: 84 | Fill #0

## 2019-10-04 MED FILL — SUBOXONE 8 MG-2 MG SL FILM: 8-2 | 28 days supply | Qty: 84 | Fill #0

## 2019-11-01 MED FILL — SUBOXONE 8 MG-2 MG SL FILM: 8-2 | 28 days supply | Qty: 84 | Fill #0

## 2019-11-29 MED FILL — SUBOXONE 8 MG-2 MG SL FILM: 8-2 | 14 days supply | Qty: 42 | Fill #0

## 2019-12-13 MED FILL — SUBOXONE 8 MG-2 MG SL FILM: 8-2 | 14 days supply | Qty: 42 | Fill #0

## 2019-12-27 MED FILL — SUBOXONE 8 MG-2 MG SL FILM: 8-2 | 28 days supply | Qty: 84 | Fill #0

## 2020-01-12 ENCOUNTER — Telehealth (INDEPENDENT_AMBULATORY_CARE_PROVIDER_SITE_OTHER): Payer: Self-pay | Admitting: Internal Medicine

## 2020-01-13 NOTE — Telephone Encounter (Signed)
Patient last seen July 2019. I sent refill of pantopazole 40mg  BID as requested but I generally do not like to have patients on twice a day high dose without routine f/up. Needs OV or telephone visit for further f/up. Thanks

## 2020-01-18 IMAGING — CT CT ABD-PELV W/ CM
2 of 4 series · 16 of 46 positions shown, 18 images · IV contrast (iopamidol)
Comparison: 09/19/2018

CLINICAL DATA: Abdominal pain, nausea, vomiting x 5 days. No hx of
abdominal surgery/

EXAM:
CT ABDOMEN AND PELVIS WITH CONTRAST
TECHNIQUE: Multidetector CT imaging of the abdomen and pelvis was performed
using the standard protocol following bolus administration of
intravenous contrast.
CONTRAST:  100mL PVKOES-NDD IOPAMIDOL (PVKOES-NDD) INJECTION 61%

[Series 2: axial st · axial · 0.69mm/px · z∈[+769,+1174]mm · 13 of 89 slices shown, 15 images]
[im 4/89  soft-tissue]
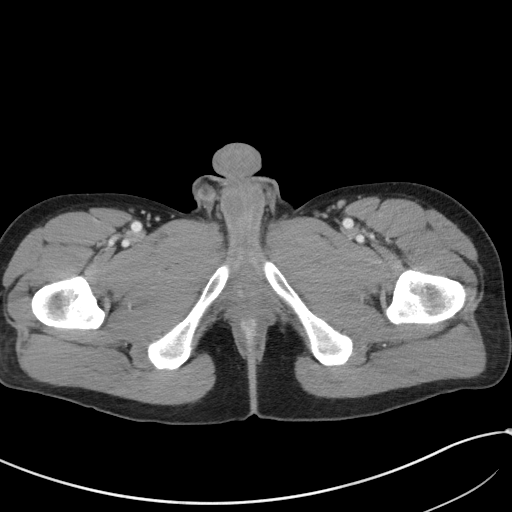
[im 4/89  bone]
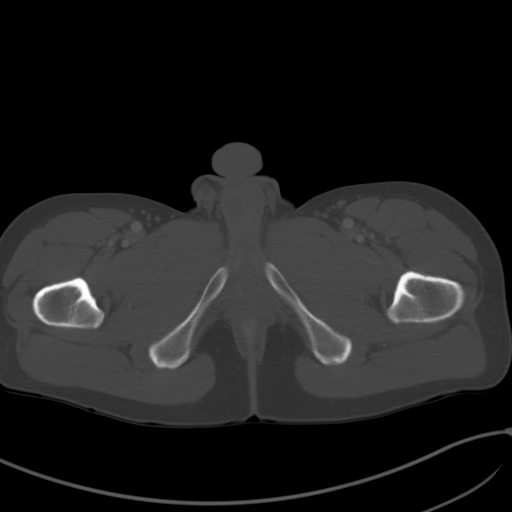
[im 11/89  soft-tissue]
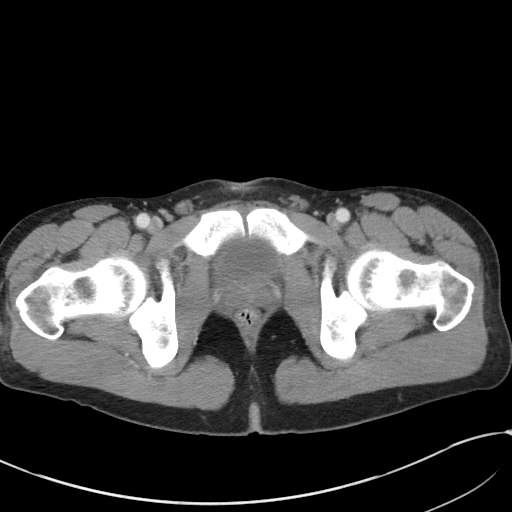
[im 18/89  soft-tissue]
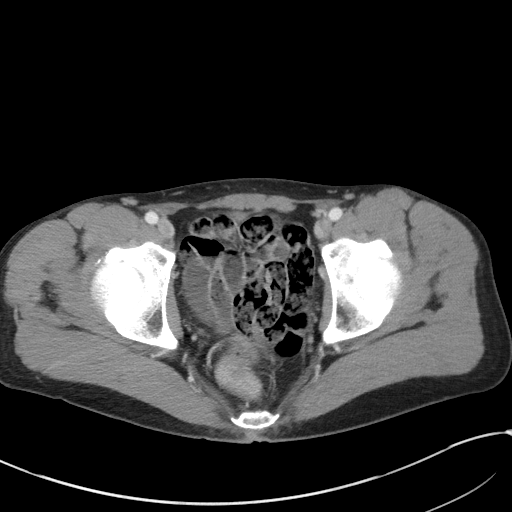
[im 25/89  soft-tissue]
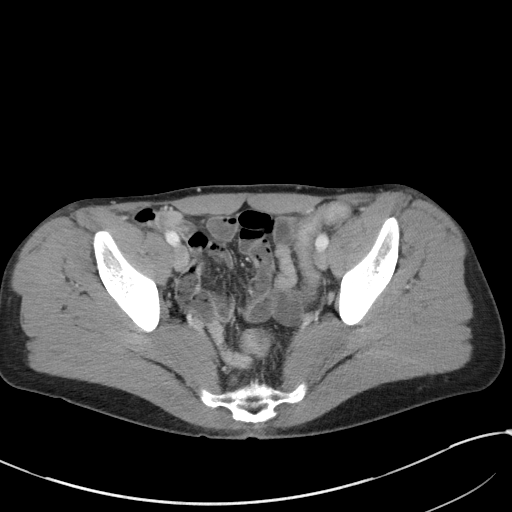
[im 32/89  soft-tissue]
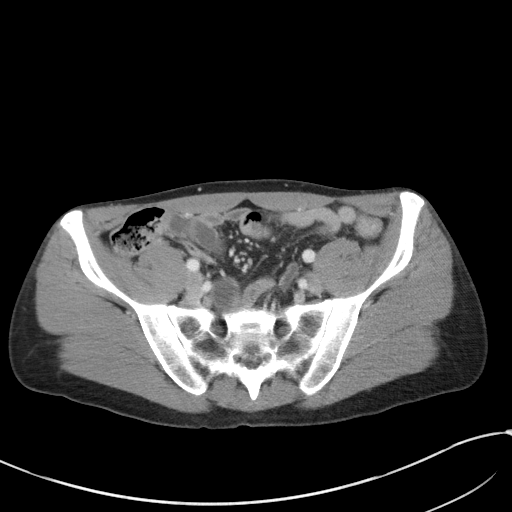
[im 39/89  soft-tissue]
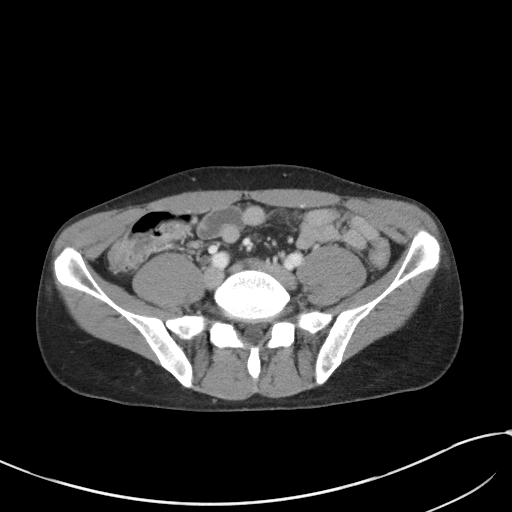
[im 46/89  soft-tissue]
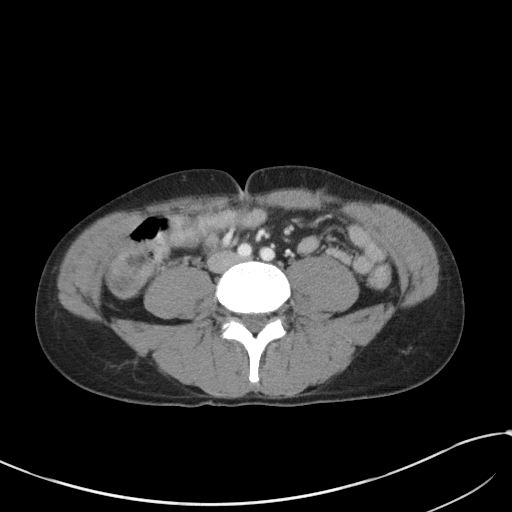
[im 50/89  soft-tissue]
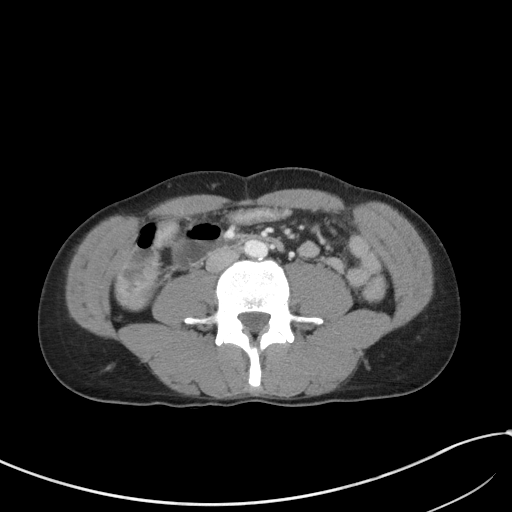
[im 57/89  soft-tissue]
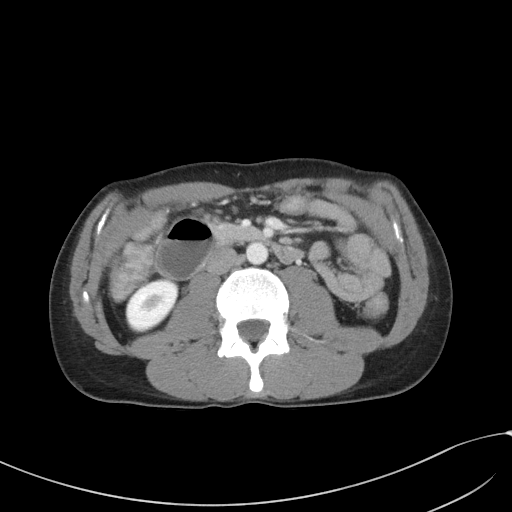
[im 57/89  bone]
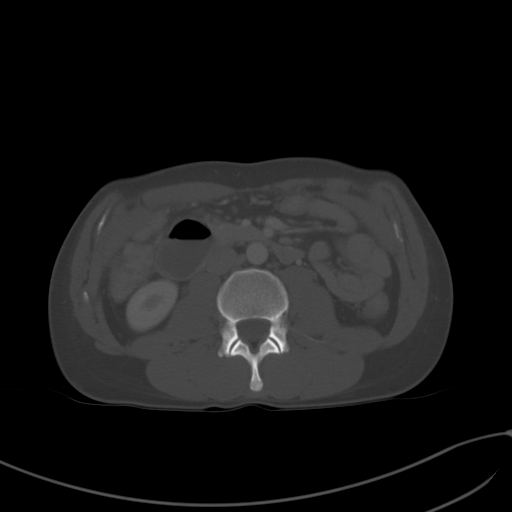
[im 64/89  soft-tissue]
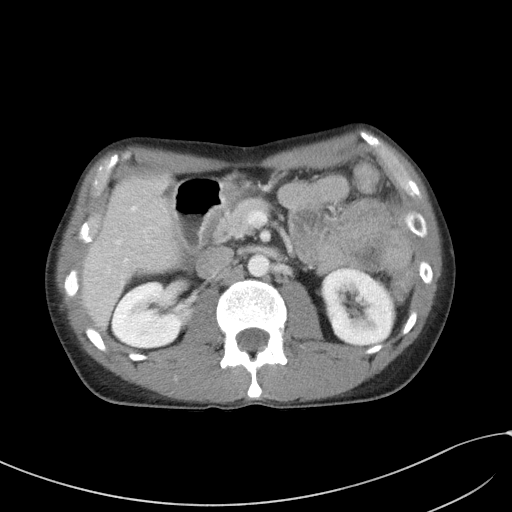
[im 71/89  soft-tissue]
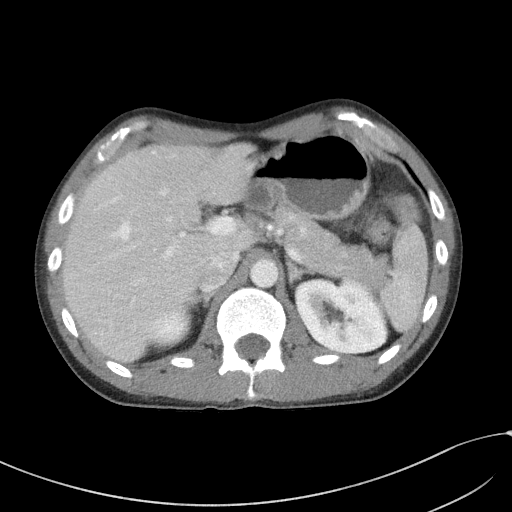
[im 78/89  soft-tissue]
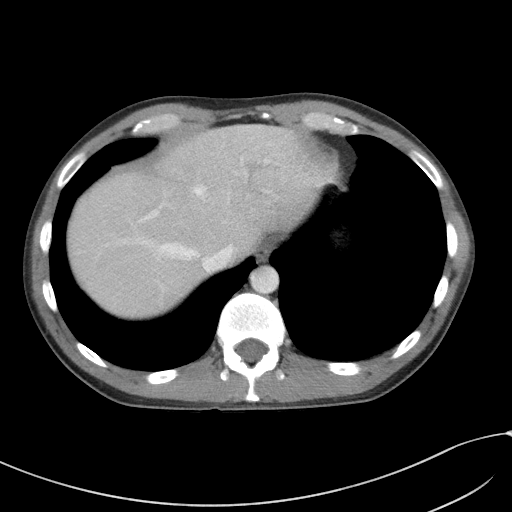
[im 85/89  soft-tissue]
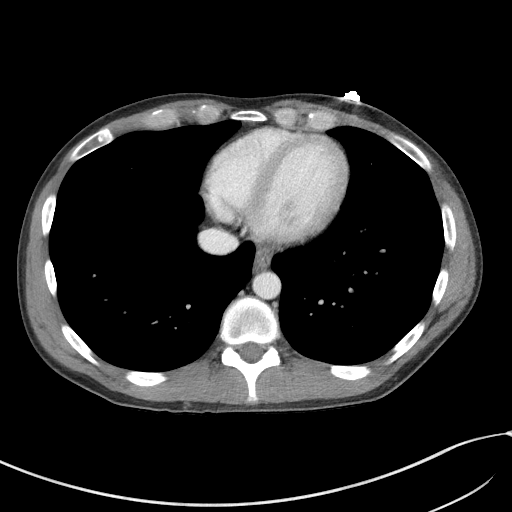

[Series 5: coronal st · coronal · 0.70mm/px · 3 of 89 slices shown]
[im 30/89  soft-tissue]
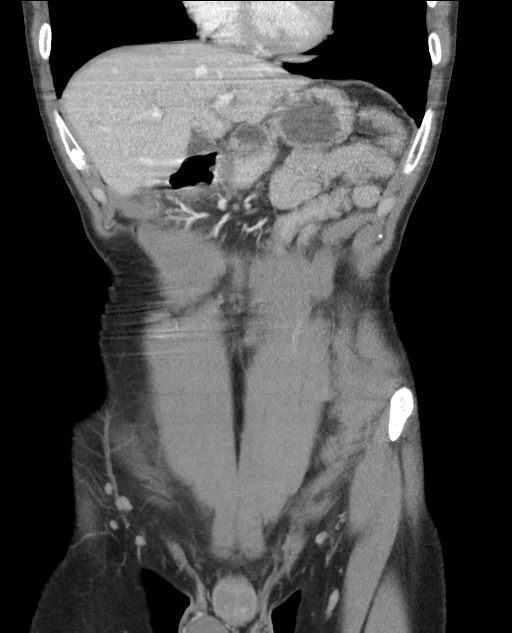
[im 40/89  soft-tissue]
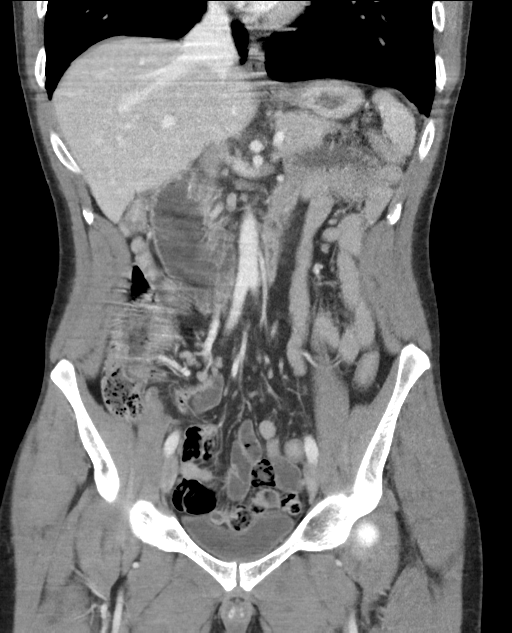
[im 49/89  soft-tissue]
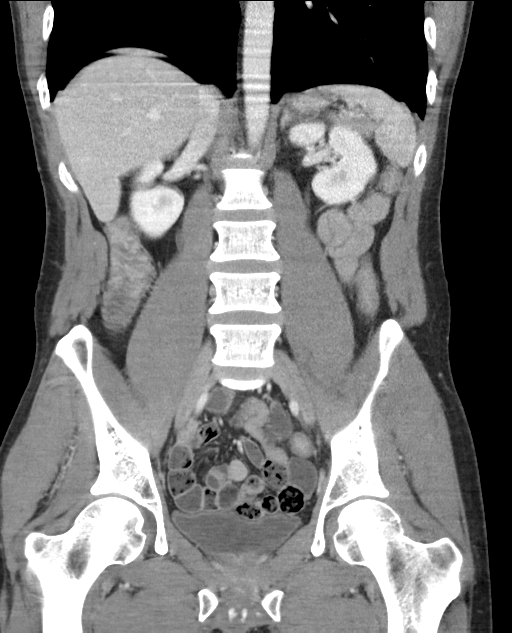

[16 of 46 positions shown; findings below may reference images not displayed]

FINDINGS: Lower chest: No acute abnormality.

Hepatobiliary: No focal liver lesion. No biliary ductal dilatation.
Gallbladder decompressed.

Pancreas: Unremarkable. No pancreatic ductal dilatation or
surrounding inflammatory changes.

Spleen: Normal in size without focal abnormality.

Adrenals/Urinary Tract: Normal adrenals. No renal mass or
hydronephrosis. Urinary bladder incompletely distended.

Stomach/Bowel: The stomach is incompletely distended. Gas and fluid
distends the proximal duodenum, which is decompressed distally.
Small bowel is nondilated. Appendix not discretely identified. No
pericecal inflammatory/edematous change. The colon is nondilated,
unremarkable.

Vascular/Lymphatic: No significant vascular findings are present. No
enlarged abdominal or pelvic lymph nodes.

Reproductive: Prostate is unremarkable.

Other: No ascites. No free air.

Musculoskeletal: No acute or significant osseous findings.
IMPRESSION: No acute findings.

## 2020-01-24 MED FILL — SUBOXONE 8 MG-2 MG SL FILM: 8-2 | 14 days supply | Qty: 42 | Fill #0

## 2020-01-31 IMAGING — DX DG ABDOMEN ACUTE W/ 1V CHEST
4 series · 4 of 4 positions shown · non-contrast
Comparison: 12/27/2018 CT

CLINICAL DATA: Mid abdominal pain radiating to the back for a
month. Nausea, vomiting and diarrhea.

EXAM:
DG ABDOMEN ACUTE W/ 1V CHEST

[chest pa]
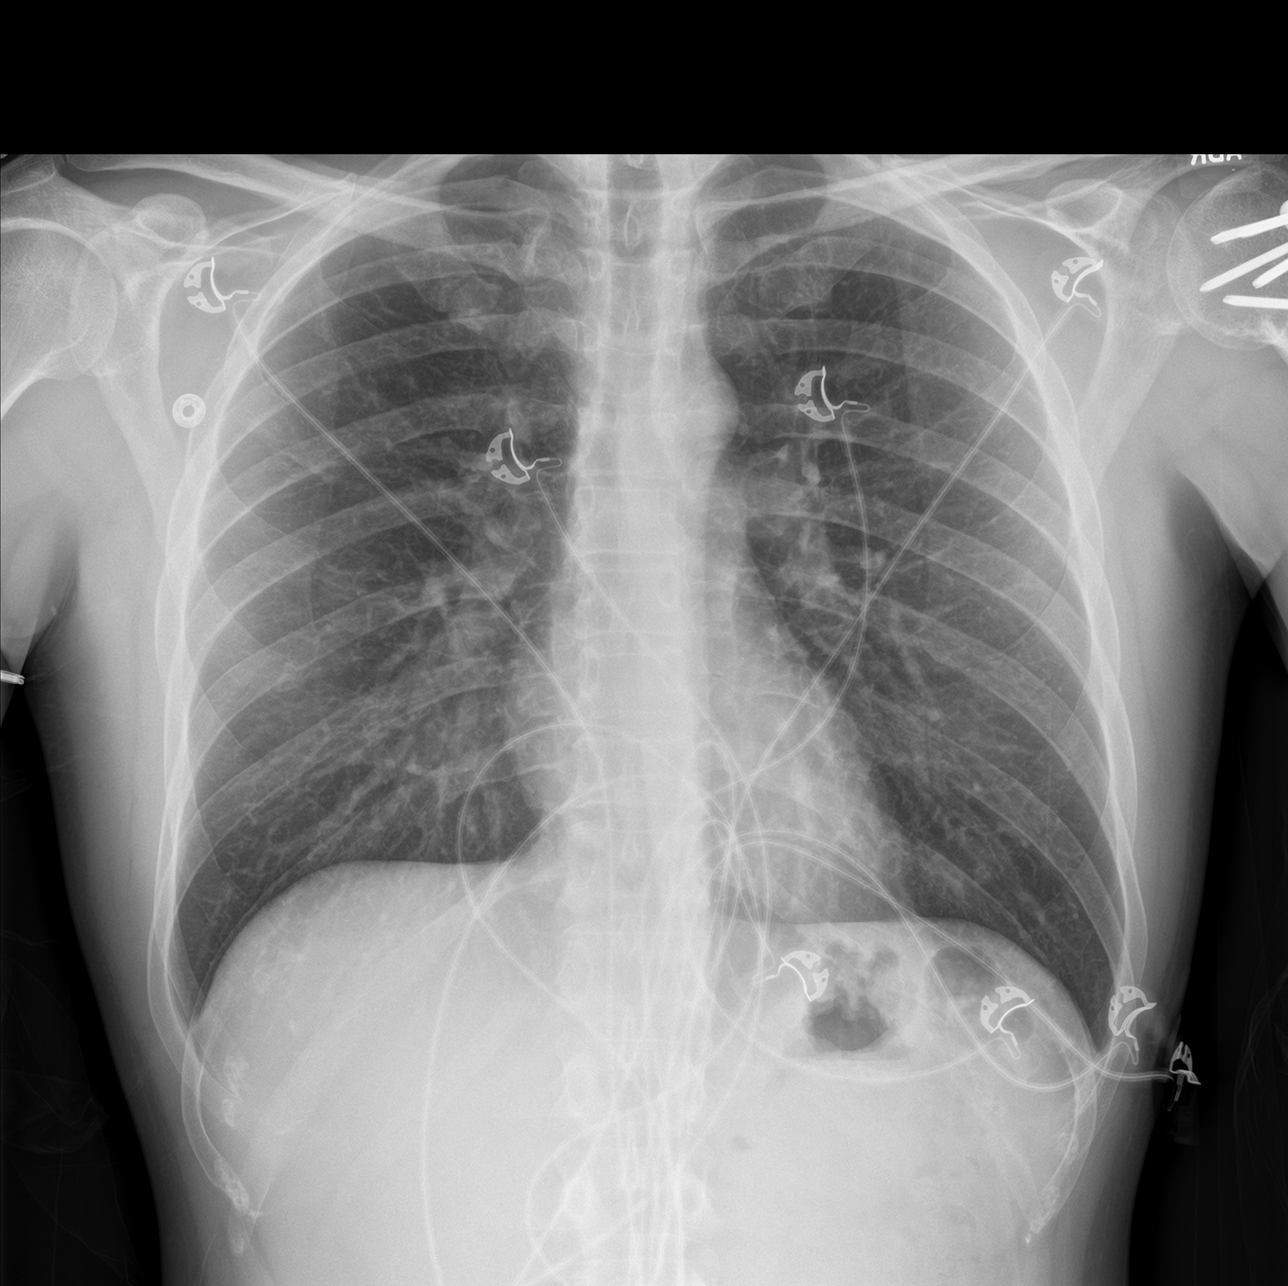

[abdomen erect]
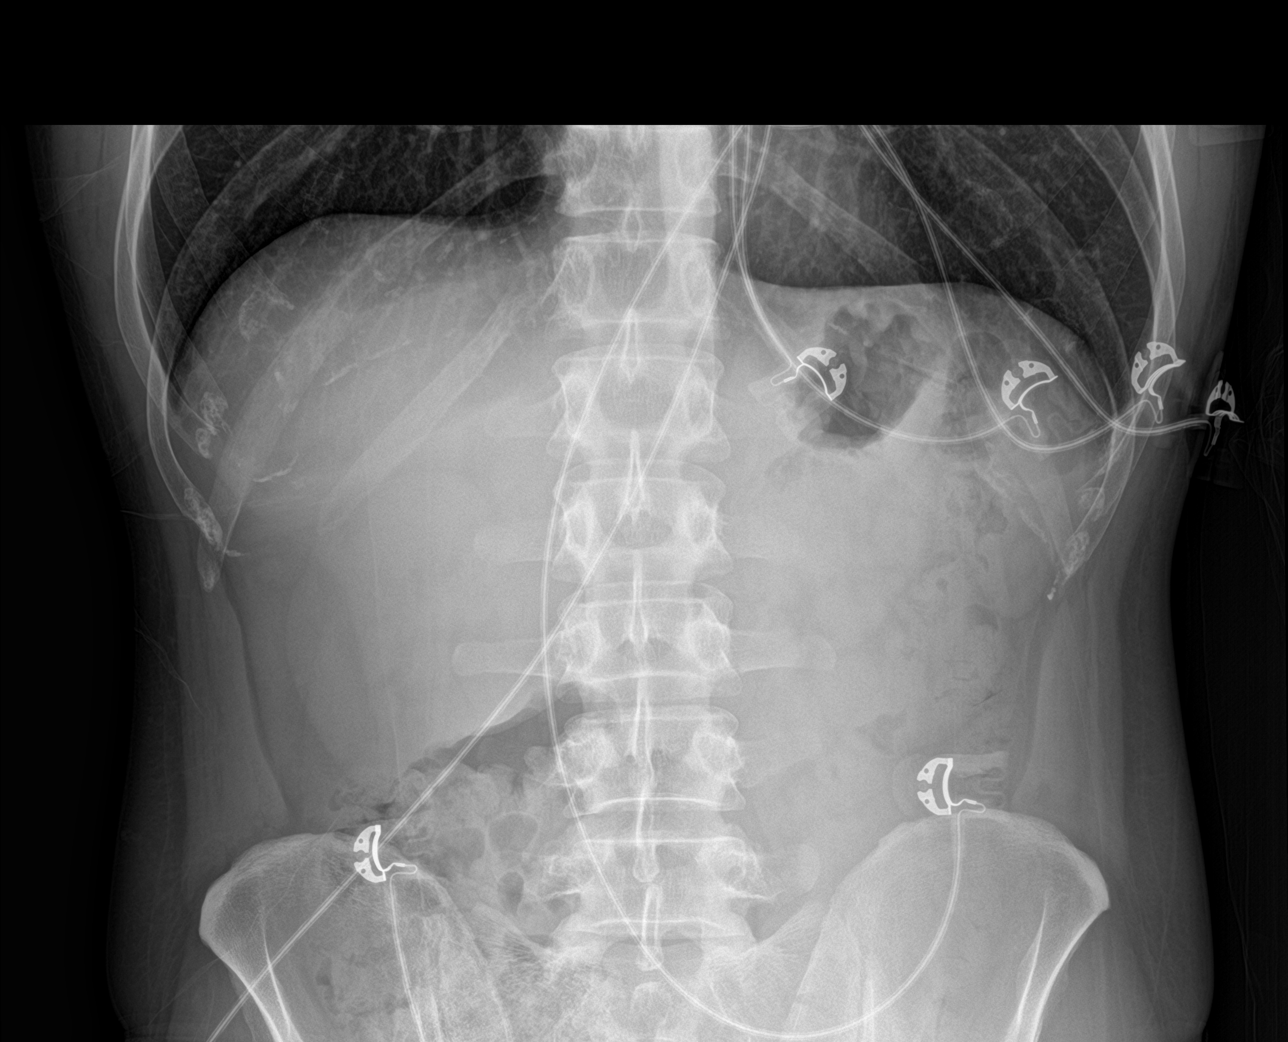

[abdomen supine (1 of 2)]
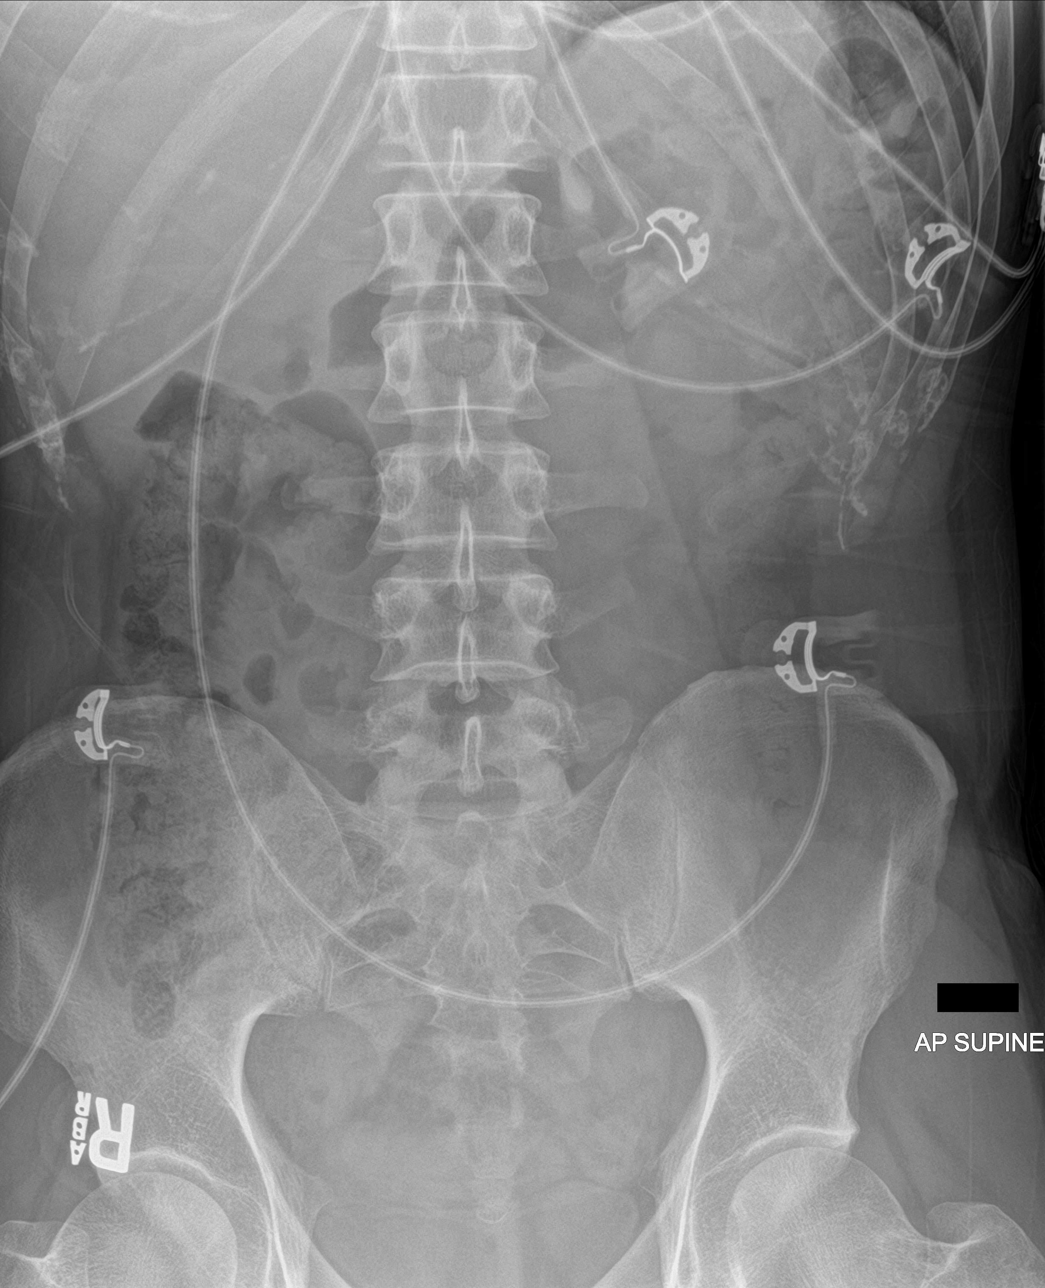

[abdomen supine (2 of 2)]
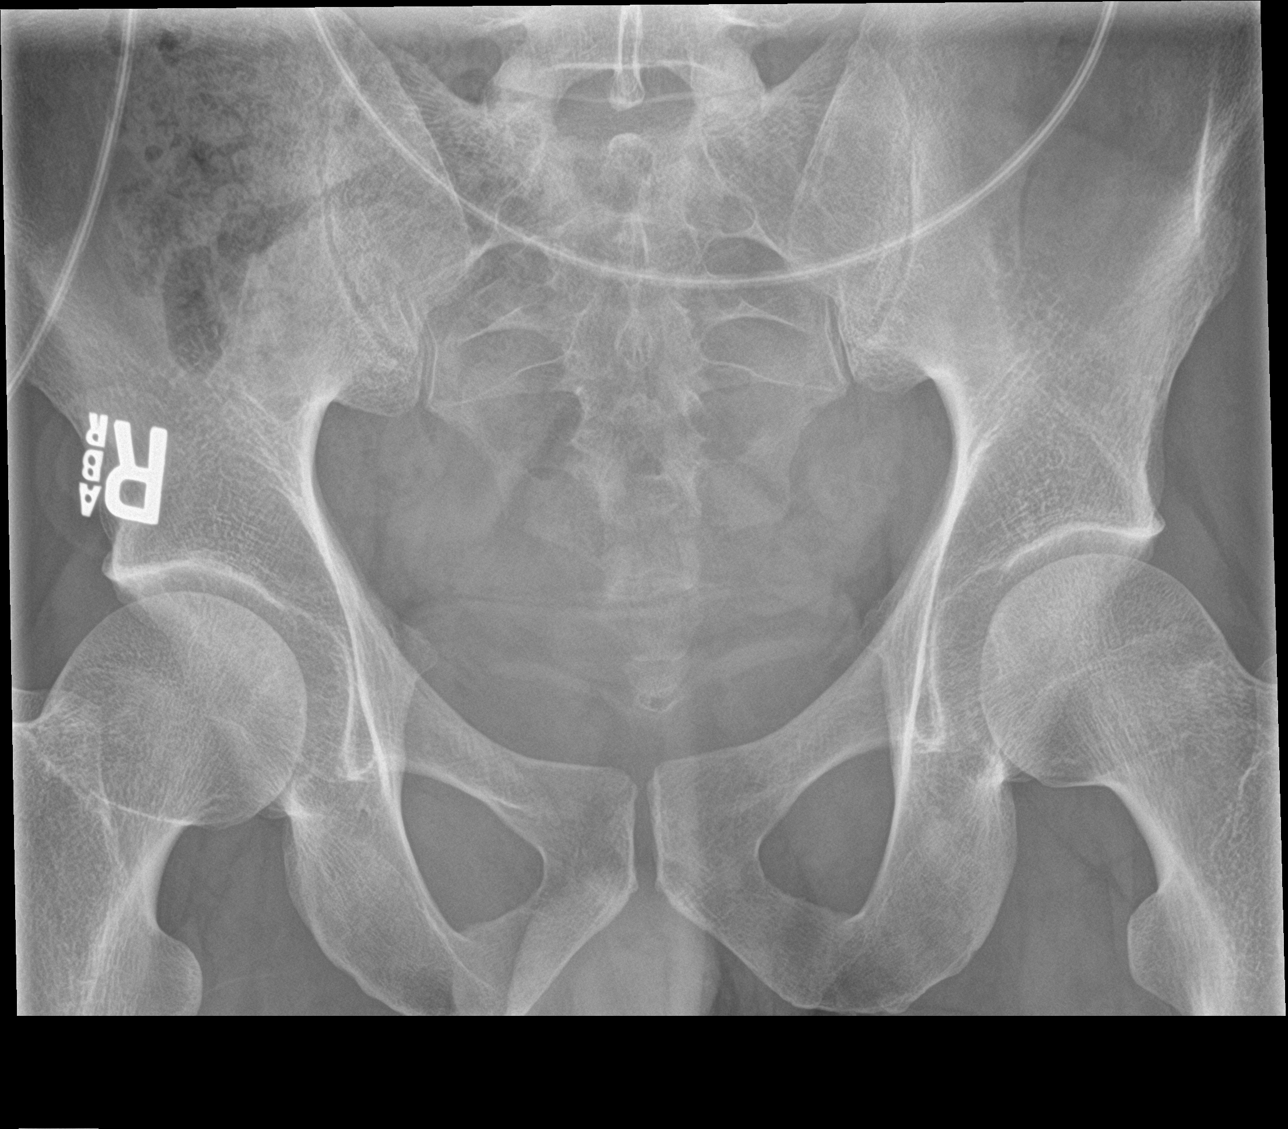

[4 of 4 positions shown; findings below may reference images not displayed]

FINDINGS: There is no evidence of dilated bowel loops or free intraperitoneal
air. No radiopaque calculi or other significant radiographic
abnormality is seen. Heart size and mediastinal contours are within
normal limits. Both lungs are clear.
IMPRESSION: Negative abdominal radiographs.  No acute cardiopulmonary disease.

## 2020-02-07 MED FILL — SUBOXONE 8 MG-2 MG SL FILM: 8-2 | 28 days supply | Qty: 84 | Fill #0

## 2020-03-06 MED FILL — SUBOXONE 8 MG-2 MG SL FILM: 8-2 | 28 days supply | Qty: 84 | Fill #0

## 2020-04-03 MED FILL — SUBOXONE 8 MG-2 MG SL FILM: 8-2 | 28 days supply | Qty: 84 | Fill #0

## 2020-04-25 ENCOUNTER — Ambulatory Visit (INDEPENDENT_AMBULATORY_CARE_PROVIDER_SITE_OTHER): Payer: Medicaid Other | Admitting: Gastroenterology

## 2020-04-27 ENCOUNTER — Other Ambulatory Visit (INDEPENDENT_AMBULATORY_CARE_PROVIDER_SITE_OTHER): Payer: Self-pay | Admitting: Gastroenterology

## 2020-04-28 ENCOUNTER — Emergency Department (HOSPITAL_BASED_OUTPATIENT_CLINIC_OR_DEPARTMENT_OTHER)
Admission: EM | Admit: 2020-04-28 | Discharge: 2020-04-28 | Discharge status: Absent without leave | Payer: Medicaid Other

## 2020-04-28 ENCOUNTER — Other Ambulatory Visit: Payer: Self-pay

## 2020-04-30 NOTE — Telephone Encounter (Signed)
Patient will need to make appointment prior to further refills. He may see Liborio Nixon or Dr.Castenda per Dr. Karilyn Cota.

## 2020-05-01 MED FILL — SUBOXONE 8 MG-2 MG SL FILM: 8-2 | 28 days supply | Qty: 84 | Fill #0

## 2020-05-29 MED FILL — SUBOXONE 8 MG-2 MG SL FILM: 8-2 | 28 days supply | Qty: 84 | Fill #0

## 2020-06-06 ENCOUNTER — Ambulatory Visit (INDEPENDENT_AMBULATORY_CARE_PROVIDER_SITE_OTHER): Payer: Medicaid Other | Admitting: Gastroenterology

## 2020-06-26 MED FILL — SUBOXONE 8 MG-2 MG SL FILM: 8-2 | 28 days supply | Qty: 84 | Fill #0

## 2020-07-24 MED FILL — SUBOXONE 8 MG-2 MG SL FILM: 8-2 | 28 days supply | Qty: 84 | Fill #0

## 2020-08-21 MED FILL — SUBOXONE 8 MG-2 MG SL FILM: 8-2 | 14 days supply | Qty: 42 | Fill #0

## 2020-09-04 MED FILL — SUBOXONE 8 MG-2 MG SL FILM: 8-2 | 14 days supply | Qty: 42 | Fill #0

## 2020-09-18 ENCOUNTER — Other Ambulatory Visit (HOSPITAL_COMMUNITY): Payer: Self-pay | Admitting: Physician Assistant

## 2020-09-18 MED FILL — SUBOXONE 8 MG-2 MG SL FILM: 8-2 | 28 days supply | Qty: 84 | Fill #0

## 2020-10-16 ENCOUNTER — Other Ambulatory Visit (HOSPITAL_COMMUNITY): Payer: Self-pay | Admitting: Physician Assistant

## 2020-10-16 MED FILL — SUBOXONE 8 MG-2 MG SL FILM: 8-2 | 28 days supply | Qty: 84 | Fill #0

## 2020-11-13 ENCOUNTER — Other Ambulatory Visit (HOSPITAL_COMMUNITY): Payer: Self-pay | Admitting: Physician Assistant

## 2020-11-13 MED FILL — LOSARTAN POTASSIUM 25 MG TA: 25 | 30 days supply | Qty: 30 | Fill #0

## 2020-11-13 MED FILL — SUBOXONE 8 MG-2 MG SL FILM: 8-2 | 28 days supply | Qty: 84 | Fill #0

## 2020-12-04 ENCOUNTER — Other Ambulatory Visit (HOSPITAL_COMMUNITY): Payer: Self-pay | Admitting: Physician Assistant

## 2020-12-10 MED FILL — SUBOXONE 8 MG-2 MG SL FILM: 8-2 | 14 days supply | Qty: 42 | Fill #0

## 2020-12-13 ENCOUNTER — Other Ambulatory Visit (HOSPITAL_COMMUNITY): Payer: Self-pay

## 2020-12-13 MED FILL — LAMOTRIGINE 25 MG TABS: 25 | 31 days supply | Qty: 54 | Fill #0

## 2020-12-27 ENCOUNTER — Other Ambulatory Visit (HOSPITAL_COMMUNITY): Payer: Self-pay

## 2020-12-27 MED FILL — SUBOXONE 8 MG-2 MG SL FILM: 8-2 | 30 days supply | Qty: 90 | Fill #0

## 2021-01-24 ENCOUNTER — Other Ambulatory Visit (HOSPITAL_COMMUNITY): Payer: Self-pay

## 2021-01-24 MED FILL — LORAZEPAM 0.5 MG TABS: 0.5 | 30 days supply | Qty: 10 | Fill #0

## 2021-01-24 MED FILL — SUBOXONE 8 MG-2 MG SL FILM: 8-2 | 30 days supply | Qty: 90 | Fill #0

## 2021-01-24 MED FILL — LAMOTRIGINE 100 MG TABS: 100 | 30 days supply | Qty: 30 | Fill #0

## 2021-01-24 MED FILL — BUSPIRONE HCL 15 MG TABS: 15 | 30 days supply | Qty: 90 | Fill #0

## 2021-02-21 ENCOUNTER — Other Ambulatory Visit (HOSPITAL_COMMUNITY): Payer: Self-pay

## 2021-02-21 MED FILL — LAMOTRIGINE 100 MG TABS: 100 | 30 days supply | Qty: 30 | Fill #0

## 2021-02-21 MED FILL — SUBOXONE 8 MG-2 MG SL FILM: 8-2 | 30 days supply | Qty: 90 | Fill #0

## 2021-02-21 MED FILL — LORAZEPAM 0.5 MG TABS: 0.5 | 30 days supply | Qty: 10 | Fill #0

## 2021-02-21 MED FILL — BUSPIRONE HCL 30 MG TABS: 30 | 30 days supply | Qty: 60 | Fill #0

## 2021-03-21 ENCOUNTER — Other Ambulatory Visit (HOSPITAL_COMMUNITY): Payer: Self-pay

## 2021-03-21 MED ORDER — BUSPIRONE HCL 30 MG PO TABS
ORAL_TABLET | ORAL | 0 refills | Status: DC
  Filled 2021-03-21: qty 60, 30d supply, fill #0

## 2021-03-21 MED ORDER — LORAZEPAM 0.5 MG PO TABS
ORAL_TABLET | ORAL | 0 refills | Status: DC
  Filled 2021-03-21: qty 10, 30d supply, fill #0

## 2021-03-21 MED ORDER — BUPRENORPHINE HCL-NALOXONE HCL 8-2 MG SL FILM
ORAL_FILM | SUBLINGUAL | 0 refills | Status: DC
  Filled 2021-03-21: qty 90, 30d supply, fill #0

## 2021-03-21 MED ORDER — LAMOTRIGINE 100 MG PO TABS
ORAL_TABLET | ORAL | 0 refills | Status: DC
  Filled 2021-03-21: qty 30, 30d supply, fill #0

## 2021-04-18 ENCOUNTER — Other Ambulatory Visit (HOSPITAL_COMMUNITY): Payer: Self-pay

## 2021-04-18 MED ORDER — BUPRENORPHINE HCL-NALOXONE HCL 8-2 MG SL FILM
ORAL_FILM | SUBLINGUAL | 0 refills | Status: DC
  Filled 2021-04-18: qty 90, 30d supply, fill #0

## 2021-04-18 MED ORDER — LAMOTRIGINE 150 MG PO TABS
150.0000 mg | ORAL_TABLET | Freq: Every day | ORAL | 0 refills | Status: DC
  Filled 2021-04-18: qty 30, 30d supply, fill #0

## 2021-04-18 MED ORDER — LORAZEPAM 0.5 MG PO TABS
0.5000 mg | ORAL_TABLET | Freq: Every day | ORAL | 0 refills | Status: DC | PRN
  Filled 2021-04-18: qty 10, 30d supply, fill #0

## 2021-04-18 MED ORDER — ARIPIPRAZOLE 5 MG PO TABS
ORAL_TABLET | ORAL | 0 refills | Status: DC
  Filled 2021-04-18 (×2): qty 60, 30d supply, fill #0
  Filled 2021-05-16: qty 30, 30d supply, fill #0

## 2021-04-18 MED ORDER — BUSPIRONE HCL 30 MG PO TABS
30.0000 mg | ORAL_TABLET | Freq: Two times a day (BID) | ORAL | 0 refills | Status: DC
  Filled 2021-04-18: qty 60, 30d supply, fill #0

## 2021-05-16 ENCOUNTER — Other Ambulatory Visit (HOSPITAL_COMMUNITY): Payer: Self-pay

## 2021-05-16 MED ORDER — ARIPIPRAZOLE 10 MG PO TABS
10.0000 mg | ORAL_TABLET | Freq: Every day | ORAL | 0 refills | Status: DC
  Filled 2021-05-16 – 2021-05-22 (×3): qty 30, 30d supply, fill #0

## 2021-05-16 MED ORDER — BUSPIRONE HCL 30 MG PO TABS
30.0000 mg | ORAL_TABLET | Freq: Two times a day (BID) | ORAL | 0 refills | Status: DC
  Filled 2021-05-16: qty 60, 30d supply, fill #0

## 2021-05-16 MED ORDER — LAMOTRIGINE 150 MG PO TABS
150.0000 mg | ORAL_TABLET | Freq: Every day | ORAL | 0 refills | Status: DC
  Filled 2021-05-16: qty 30, 30d supply, fill #0

## 2021-05-16 MED ORDER — LORAZEPAM 0.5 MG PO TABS
ORAL_TABLET | ORAL | 0 refills | Status: DC
  Filled 2021-05-16: qty 10, 30d supply, fill #0

## 2021-05-16 MED ORDER — BUPRENORPHINE HCL-NALOXONE HCL 8-2 MG SL FILM
ORAL_FILM | SUBLINGUAL | 0 refills | Status: DC
  Filled 2021-05-16: qty 90, 30d supply, fill #0

## 2021-05-22 ENCOUNTER — Other Ambulatory Visit (HOSPITAL_COMMUNITY): Payer: Self-pay

## 2021-05-30 ENCOUNTER — Other Ambulatory Visit (HOSPITAL_COMMUNITY): Payer: Self-pay

## 2021-06-13 ENCOUNTER — Other Ambulatory Visit (HOSPITAL_COMMUNITY): Payer: Self-pay

## 2021-06-13 MED ORDER — BUPRENORPHINE HCL-NALOXONE HCL 8-2 MG SL FILM
ORAL_FILM | SUBLINGUAL | 0 refills | Status: DC
  Filled 2021-06-13: qty 90, 30d supply, fill #0

## 2021-06-13 MED ORDER — ARIPIPRAZOLE 10 MG PO TABS
ORAL_TABLET | ORAL | 0 refills | Status: DC
  Filled 2021-06-13: qty 30, 30d supply, fill #0

## 2021-06-13 MED ORDER — LAMOTRIGINE 150 MG PO TABS
ORAL_TABLET | ORAL | 0 refills | Status: DC
  Filled 2021-06-13: qty 30, 30d supply, fill #0

## 2021-06-13 MED ORDER — LORAZEPAM 0.5 MG PO TABS
ORAL_TABLET | ORAL | 0 refills | Status: DC
  Filled 2021-06-13: qty 10, 30d supply, fill #0

## 2021-06-13 MED ORDER — BUSPIRONE HCL 30 MG PO TABS
ORAL_TABLET | ORAL | 0 refills | Status: DC
  Filled 2021-06-13: qty 60, 30d supply, fill #0

## 2021-07-11 ENCOUNTER — Other Ambulatory Visit (HOSPITAL_COMMUNITY): Payer: Self-pay

## 2021-07-11 MED ORDER — LAMOTRIGINE 200 MG PO TABS
ORAL_TABLET | Freq: Every day | ORAL | 0 refills | Status: DC
  Filled 2021-07-11: qty 30, 30d supply, fill #0

## 2021-07-11 MED ORDER — BUPRENORPHINE HCL-NALOXONE HCL 8-2 MG SL FILM
ORAL_FILM | SUBLINGUAL | 0 refills | Status: DC
  Filled 2021-07-11: qty 90, 30d supply, fill #0

## 2021-07-11 MED ORDER — LORAZEPAM 0.5 MG PO TABS
ORAL_TABLET | ORAL | 0 refills | Status: DC
  Filled 2021-07-11: qty 10, 30d supply, fill #0

## 2021-07-11 MED ORDER — BUSPIRONE HCL 30 MG PO TABS
ORAL_TABLET | ORAL | 0 refills | Status: DC
  Filled 2021-07-11: qty 60, 30d supply, fill #0

## 2021-07-11 MED ORDER — QUETIAPINE FUMARATE 50 MG PO TABS
ORAL_TABLET | ORAL | 0 refills | Status: DC
  Filled 2021-07-11: qty 30, 30d supply, fill #0

## 2021-08-08 ENCOUNTER — Other Ambulatory Visit (HOSPITAL_COMMUNITY): Payer: Self-pay

## 2021-08-08 MED ORDER — LAMOTRIGINE 200 MG PO TABS
ORAL_TABLET | Freq: Every day | ORAL | 0 refills | Status: DC
  Filled 2021-08-08: qty 30, 30d supply, fill #0

## 2021-08-08 MED ORDER — BUPRENORPHINE HCL-NALOXONE HCL 8-2 MG SL FILM
ORAL_FILM | SUBLINGUAL | 0 refills | Status: DC
  Filled 2021-08-08: qty 90, 30d supply, fill #0

## 2021-08-08 MED ORDER — QUETIAPINE FUMARATE 50 MG PO TABS
ORAL_TABLET | ORAL | 0 refills | Status: DC
  Filled 2021-08-08: qty 30, 30d supply, fill #0

## 2021-08-08 MED ORDER — LORAZEPAM 0.5 MG PO TABS
ORAL_TABLET | ORAL | 0 refills | Status: DC
  Filled 2021-08-08: qty 10, 30d supply, fill #0

## 2021-08-08 MED ORDER — BUSPIRONE HCL 30 MG PO TABS
ORAL_TABLET | ORAL | 0 refills | Status: DC
  Filled 2021-08-08: qty 60, 30d supply, fill #0

## 2021-09-05 ENCOUNTER — Other Ambulatory Visit (HOSPITAL_COMMUNITY): Payer: Self-pay

## 2021-09-05 MED ORDER — LAMOTRIGINE 200 MG PO TABS
ORAL_TABLET | Freq: Every day | ORAL | 0 refills | Status: DC
  Filled 2021-09-05: qty 30, 30d supply, fill #0

## 2021-09-05 MED ORDER — BUPRENORPHINE HCL-NALOXONE HCL 8-2 MG SL FILM
1.0000 | ORAL_FILM | Freq: Three times a day (TID) | SUBLINGUAL | 0 refills | Status: DC
  Filled 2021-09-05: qty 90, 30d supply, fill #0

## 2021-09-05 MED ORDER — LORAZEPAM 0.5 MG PO TABS
ORAL_TABLET | ORAL | 0 refills | Status: DC
  Filled 2021-09-05: qty 10, 30d supply, fill #0

## 2021-09-05 MED ORDER — BUSPIRONE HCL 30 MG PO TABS
ORAL_TABLET | ORAL | 0 refills | Status: DC
  Filled 2021-09-05: qty 60, 30d supply, fill #0

## 2021-09-05 MED ORDER — QUETIAPINE FUMARATE 50 MG PO TABS
ORAL_TABLET | ORAL | 0 refills | Status: DC
  Filled 2021-09-05: qty 30, 30d supply, fill #0

## 2021-10-03 ENCOUNTER — Other Ambulatory Visit (HOSPITAL_COMMUNITY): Payer: Self-pay

## 2021-10-03 MED ORDER — LORAZEPAM 0.5 MG PO TABS
ORAL_TABLET | ORAL | 0 refills | Status: DC
  Filled 2021-10-03: qty 10, 30d supply, fill #0

## 2021-10-03 MED ORDER — LAMOTRIGINE 200 MG PO TABS
ORAL_TABLET | Freq: Every day | ORAL | 0 refills | Status: DC
  Filled 2021-10-03: qty 30, 30d supply, fill #0

## 2021-10-03 MED ORDER — QUETIAPINE FUMARATE 50 MG PO TABS
ORAL_TABLET | ORAL | 0 refills | Status: DC
  Filled 2021-10-03: qty 30, 30d supply, fill #0

## 2021-10-03 MED ORDER — BUSPIRONE HCL 30 MG PO TABS
ORAL_TABLET | ORAL | 0 refills | Status: DC
  Filled 2021-10-03: qty 60, 30d supply, fill #0

## 2021-10-03 MED ORDER — BUPRENORPHINE HCL-NALOXONE HCL 8-2 MG SL FILM
ORAL_FILM | SUBLINGUAL | 0 refills | Status: DC
  Filled 2021-10-03: qty 42, 14d supply, fill #0

## 2021-10-07 ENCOUNTER — Other Ambulatory Visit (HOSPITAL_COMMUNITY): Payer: Self-pay

## 2021-10-09 ENCOUNTER — Other Ambulatory Visit (HOSPITAL_COMMUNITY): Payer: Self-pay

## 2021-10-16 ENCOUNTER — Other Ambulatory Visit (HOSPITAL_COMMUNITY): Payer: Self-pay

## 2021-10-17 ENCOUNTER — Other Ambulatory Visit (HOSPITAL_COMMUNITY): Payer: Self-pay

## 2021-10-17 MED ORDER — LORAZEPAM 0.5 MG PO TABS
ORAL_TABLET | ORAL | 0 refills | Status: DC
  Filled 2021-10-17: qty 30, 30d supply, fill #0
  Filled 2021-10-31: qty 10, 10d supply, fill #0

## 2021-10-17 MED ORDER — BUPRENORPHINE HCL-NALOXONE HCL 8-2 MG SL FILM
ORAL_FILM | SUBLINGUAL | 0 refills | Status: DC
  Filled 2021-10-17: qty 42, 14d supply, fill #0

## 2021-10-25 ENCOUNTER — Other Ambulatory Visit (HOSPITAL_COMMUNITY): Payer: Self-pay

## 2021-10-31 ENCOUNTER — Other Ambulatory Visit (HOSPITAL_COMMUNITY): Payer: Self-pay

## 2021-10-31 MED ORDER — QUETIAPINE FUMARATE 50 MG PO TABS
ORAL_TABLET | ORAL | 0 refills | Status: DC
  Filled 2021-10-31: qty 30, 30d supply, fill #0

## 2021-10-31 MED ORDER — BUPRENORPHINE HCL-NALOXONE HCL 8-2 MG SL FILM
ORAL_FILM | SUBLINGUAL | 0 refills | Status: DC
  Filled 2021-10-31: qty 90, 30d supply, fill #0

## 2021-10-31 MED ORDER — LAMOTRIGINE 200 MG PO TABS
ORAL_TABLET | Freq: Every day | ORAL | 0 refills | Status: DC
  Filled 2021-10-31: qty 30, 30d supply, fill #0

## 2021-10-31 MED ORDER — BUSPIRONE HCL 30 MG PO TABS
ORAL_TABLET | ORAL | 0 refills | Status: DC
  Filled 2021-10-31: qty 60, 30d supply, fill #0

## 2021-11-25 ENCOUNTER — Other Ambulatory Visit (HOSPITAL_COMMUNITY): Payer: Self-pay

## 2021-11-28 ENCOUNTER — Other Ambulatory Visit (HOSPITAL_COMMUNITY): Payer: Self-pay

## 2021-11-28 MED ORDER — BUSPIRONE HCL 30 MG PO TABS
ORAL_TABLET | ORAL | 0 refills | Status: DC
  Filled 2021-11-28: qty 60, 30d supply, fill #0

## 2021-11-28 MED ORDER — QUETIAPINE FUMARATE 50 MG PO TABS
ORAL_TABLET | ORAL | 0 refills | Status: DC
  Filled 2021-11-28: qty 30, 30d supply, fill #0

## 2021-11-28 MED ORDER — BUPRENORPHINE HCL-NALOXONE HCL 8-2 MG SL FILM
ORAL_FILM | SUBLINGUAL | 0 refills | Status: DC
  Filled 2021-11-28: qty 90, 30d supply, fill #0

## 2021-11-28 MED ORDER — LAMOTRIGINE 200 MG PO TABS
ORAL_TABLET | Freq: Every day | ORAL | 0 refills | Status: DC
  Filled 2021-11-28: qty 30, 30d supply, fill #0

## 2021-12-26 ENCOUNTER — Other Ambulatory Visit (HOSPITAL_COMMUNITY): Payer: Self-pay

## 2021-12-26 MED ORDER — LAMOTRIGINE 200 MG PO TABS
ORAL_TABLET | Freq: Every day | ORAL | 0 refills | Status: DC
  Filled 2021-12-26: qty 30, 30d supply, fill #0

## 2021-12-26 MED ORDER — ARIPIPRAZOLE 5 MG PO TABS
ORAL_TABLET | ORAL | 0 refills | Status: DC
  Filled 2021-12-26: qty 30, 30d supply, fill #0

## 2021-12-26 MED ORDER — BUPRENORPHINE HCL-NALOXONE HCL 8-2 MG SL FILM
ORAL_FILM | SUBLINGUAL | 0 refills | Status: DC
  Filled 2021-12-26: qty 90, 30d supply, fill #0

## 2021-12-26 MED ORDER — QUETIAPINE FUMARATE 50 MG PO TABS
ORAL_TABLET | ORAL | 0 refills | Status: DC
  Filled 2021-12-26: qty 30, 30d supply, fill #0

## 2021-12-26 MED ORDER — BUSPIRONE HCL 30 MG PO TABS
ORAL_TABLET | ORAL | 0 refills | Status: DC
  Filled 2021-12-26: qty 60, 30d supply, fill #0

## 2022-01-03 ENCOUNTER — Other Ambulatory Visit (HOSPITAL_COMMUNITY): Payer: Self-pay

## 2022-01-22 ENCOUNTER — Other Ambulatory Visit (HOSPITAL_COMMUNITY): Payer: Self-pay

## 2022-01-22 MED ORDER — ARIPIPRAZOLE 5 MG PO TABS
5.0000 mg | ORAL_TABLET | Freq: Every evening | ORAL | 0 refills | Status: DC
  Filled 2022-01-22: qty 30, 30d supply, fill #0

## 2022-01-22 MED ORDER — BUPRENORPHINE HCL-NALOXONE HCL 8-2 MG SL FILM
ORAL_FILM | SUBLINGUAL | 0 refills | Status: DC
  Filled 2022-01-22: qty 90, 30d supply, fill #0

## 2022-01-22 MED ORDER — BUSPIRONE HCL 30 MG PO TABS
30.0000 mg | ORAL_TABLET | Freq: Two times a day (BID) | ORAL | 0 refills | Status: DC
  Filled 2022-01-22: qty 60, 30d supply, fill #0

## 2022-01-22 MED ORDER — QUETIAPINE FUMARATE 50 MG PO TABS
50.0000 mg | ORAL_TABLET | Freq: Every evening | ORAL | 0 refills | Status: DC | PRN
  Filled 2022-01-22: qty 30, 30d supply, fill #0

## 2022-01-22 MED ORDER — LAMOTRIGINE 200 MG PO TABS
200.0000 mg | ORAL_TABLET | Freq: Every day | ORAL | 0 refills | Status: DC
  Filled 2022-01-22: qty 30, 30d supply, fill #0

## 2022-02-20 ENCOUNTER — Other Ambulatory Visit (HOSPITAL_COMMUNITY): Payer: Self-pay

## 2022-02-20 MED ORDER — LORAZEPAM 0.5 MG PO TABS
ORAL_TABLET | ORAL | 0 refills | Status: DC
  Filled 2022-02-20: qty 2, 30d supply, fill #0

## 2022-02-20 MED ORDER — QUETIAPINE FUMARATE 50 MG PO TABS
ORAL_TABLET | ORAL | 0 refills | Status: DC
  Filled 2022-02-20: qty 30, 30d supply, fill #0

## 2022-02-20 MED ORDER — BUPRENORPHINE HCL 8 MG SL SUBL
SUBLINGUAL_TABLET | SUBLINGUAL | 0 refills | Status: DC
Start: 2022-02-20 — End: 2022-03-18
  Filled 2022-02-20: qty 9, 3d supply, fill #0
  Filled 2022-02-22: qty 9, 3d supply, fill #1
  Filled 2022-02-24: qty 72, 24d supply, fill #2

## 2022-02-20 MED ORDER — LAMOTRIGINE 200 MG PO TABS
ORAL_TABLET | Freq: Every day | ORAL | 0 refills | Status: DC
  Filled 2022-02-20: qty 30, 30d supply, fill #0

## 2022-02-20 MED ORDER — BUSPIRONE HCL 30 MG PO TABS
ORAL_TABLET | ORAL | 0 refills | Status: DC
  Filled 2022-02-20: qty 60, 30d supply, fill #0

## 2022-02-22 ENCOUNTER — Other Ambulatory Visit (HOSPITAL_COMMUNITY): Payer: Self-pay

## 2022-02-24 ENCOUNTER — Other Ambulatory Visit (HOSPITAL_COMMUNITY): Payer: Self-pay

## 2022-02-26 ENCOUNTER — Other Ambulatory Visit (HOSPITAL_COMMUNITY): Payer: Self-pay

## 2022-03-18 ENCOUNTER — Other Ambulatory Visit (HOSPITAL_COMMUNITY): Payer: Self-pay

## 2022-03-18 MED ORDER — BUSPIRONE HCL 30 MG PO TABS
ORAL_TABLET | ORAL | 0 refills | Status: DC
  Filled 2022-03-18: qty 60, 30d supply, fill #0

## 2022-03-18 MED ORDER — LAMOTRIGINE 200 MG PO TABS
ORAL_TABLET | Freq: Every day | ORAL | 0 refills | Status: DC
  Filled 2022-03-18: qty 30, 30d supply, fill #0

## 2022-03-18 MED ORDER — BUPRENORPHINE HCL 8 MG SL SUBL
SUBLINGUAL_TABLET | SUBLINGUAL | 0 refills | Status: DC
  Filled 2022-03-18: qty 90, 30d supply, fill #0

## 2022-03-18 MED ORDER — ESCITALOPRAM OXALATE 10 MG PO TABS
ORAL_TABLET | ORAL | 0 refills | Status: DC
  Filled 2022-03-18: qty 30, 33d supply, fill #0

## 2022-03-18 MED ORDER — QUETIAPINE FUMARATE 50 MG PO TABS
ORAL_TABLET | ORAL | 0 refills | Status: DC
  Filled 2022-03-18: qty 30, 30d supply, fill #0

## 2022-03-18 MED ORDER — PANTOPRAZOLE SODIUM 40 MG PO TBEC
DELAYED_RELEASE_TABLET | Freq: Two times a day (BID) | ORAL | 0 refills | Status: DC
Start: 2022-03-18 — End: 2022-04-15
  Filled 2022-03-18: qty 60, 30d supply, fill #0

## 2022-03-18 MED ORDER — LORAZEPAM 0.5 MG PO TABS
ORAL_TABLET | ORAL | 0 refills | Status: DC
  Filled 2022-03-18: qty 15, 30d supply, fill #0

## 2022-04-15 ENCOUNTER — Other Ambulatory Visit (HOSPITAL_COMMUNITY): Payer: Self-pay

## 2022-04-15 MED ORDER — BUPRENORPHINE HCL 8 MG SL SUBL
SUBLINGUAL_TABLET | SUBLINGUAL | 0 refills | Status: DC
  Filled 2022-04-15: qty 30, 70d supply, fill #0
  Filled 2022-04-15 (×2): qty 60, 20d supply, fill #0
  Filled 2022-04-15: qty 30, 10d supply, fill #0

## 2022-04-15 MED ORDER — LORAZEPAM 0.5 MG PO TABS
ORAL_TABLET | ORAL | 0 refills | Status: DC
  Filled 2022-04-15: qty 15, 30d supply, fill #0

## 2022-04-15 MED ORDER — QUETIAPINE FUMARATE 50 MG PO TABS
ORAL_TABLET | ORAL | 0 refills | Status: DC
Start: 2022-04-15 — End: 2022-05-14
  Filled 2022-04-15: qty 30, 30d supply, fill #0

## 2022-04-15 MED ORDER — PANTOPRAZOLE SODIUM 40 MG PO TBEC
DELAYED_RELEASE_TABLET | Freq: Two times a day (BID) | ORAL | 0 refills | Status: DC
  Filled 2022-04-15: qty 60, 30d supply, fill #0

## 2022-04-15 MED ORDER — BUSPIRONE HCL 30 MG PO TABS
ORAL_TABLET | ORAL | 0 refills | Status: DC
  Filled 2022-04-15: qty 60, 30d supply, fill #0

## 2022-04-15 MED ORDER — ESCITALOPRAM OXALATE 10 MG PO TABS
ORAL_TABLET | Freq: Every day | ORAL | 0 refills | Status: DC
  Filled 2022-04-15: qty 30, 30d supply, fill #0

## 2022-04-15 MED ORDER — LAMOTRIGINE 200 MG PO TABS
ORAL_TABLET | Freq: Every day | ORAL | 0 refills | Status: DC
  Filled 2022-04-15: qty 30, 30d supply, fill #0

## 2022-05-12 ENCOUNTER — Emergency Department (HOSPITAL_COMMUNITY): Payer: Medicaid Other

## 2022-05-12 ENCOUNTER — Inpatient Hospital Stay (HOSPITAL_COMMUNITY)
Admission: EM | Admit: 2022-05-12 | Discharge: 2022-05-14 | DRG: 378 | Discharge status: Against Medical Advice | Payer: Medicaid Other | Attending: Internal Medicine | Admitting: Internal Medicine

## 2022-05-12 ENCOUNTER — Encounter (HOSPITAL_COMMUNITY): Payer: Self-pay

## 2022-05-12 ENCOUNTER — Other Ambulatory Visit: Payer: Self-pay

## 2022-05-12 DIAGNOSIS — K922 Gastrointestinal hemorrhage, unspecified: Secondary | ICD-10-CM | POA: Diagnosis not present

## 2022-05-12 DIAGNOSIS — K920 Hematemesis: Secondary | ICD-10-CM | POA: Diagnosis present

## 2022-05-12 DIAGNOSIS — F418 Other specified anxiety disorders: Secondary | ICD-10-CM | POA: Diagnosis present

## 2022-05-12 DIAGNOSIS — I10 Essential (primary) hypertension: Secondary | ICD-10-CM | POA: Diagnosis present

## 2022-05-12 DIAGNOSIS — M792 Neuralgia and neuritis, unspecified: Secondary | ICD-10-CM | POA: Diagnosis present

## 2022-05-12 DIAGNOSIS — Z87891 Personal history of nicotine dependence: Secondary | ICD-10-CM

## 2022-05-12 DIAGNOSIS — Z87442 Personal history of urinary calculi: Secondary | ICD-10-CM

## 2022-05-12 DIAGNOSIS — Z886 Allergy status to analgesic agent status: Secondary | ICD-10-CM

## 2022-05-12 DIAGNOSIS — Z888 Allergy status to other drugs, medicaments and biological substances status: Secondary | ICD-10-CM

## 2022-05-12 DIAGNOSIS — F4310 Post-traumatic stress disorder, unspecified: Secondary | ICD-10-CM | POA: Diagnosis present

## 2022-05-12 DIAGNOSIS — K315 Obstruction of duodenum: Secondary | ICD-10-CM | POA: Diagnosis present

## 2022-05-12 DIAGNOSIS — K264 Chronic or unspecified duodenal ulcer with hemorrhage: Principal | ICD-10-CM | POA: Diagnosis present

## 2022-05-12 DIAGNOSIS — R569 Unspecified convulsions: Secondary | ICD-10-CM | POA: Diagnosis present

## 2022-05-12 DIAGNOSIS — Z79899 Other long term (current) drug therapy: Secondary | ICD-10-CM

## 2022-05-12 DIAGNOSIS — G8929 Other chronic pain: Secondary | ICD-10-CM | POA: Diagnosis present

## 2022-05-12 DIAGNOSIS — I471 Supraventricular tachycardia: Secondary | ICD-10-CM | POA: Diagnosis not present

## 2022-05-12 DIAGNOSIS — Z8711 Personal history of peptic ulcer disease: Secondary | ICD-10-CM

## 2022-05-12 DIAGNOSIS — K219 Gastro-esophageal reflux disease without esophagitis: Secondary | ICD-10-CM | POA: Diagnosis present

## 2022-05-12 DIAGNOSIS — E876 Hypokalemia: Secondary | ICD-10-CM | POA: Diagnosis not present

## 2022-05-12 DIAGNOSIS — K311 Adult hypertrophic pyloric stenosis: Secondary | ICD-10-CM | POA: Diagnosis present

## 2022-05-12 DIAGNOSIS — F1120 Opioid dependence, uncomplicated: Secondary | ICD-10-CM

## 2022-05-12 DIAGNOSIS — D649 Anemia, unspecified: Secondary | ICD-10-CM | POA: Diagnosis present

## 2022-05-12 LAB — CBC
HCT: 34.6 % — ABNORMAL LOW (ref 39.0–52.0)
HCT: 29.7 % — ABNORMAL LOW (ref 39.0–52.0)
Hemoglobin: 9.9 g/dL — ABNORMAL LOW (ref 13.0–17.0)
Hemoglobin: 8.5 g/dL — ABNORMAL LOW (ref 13.0–17.0)
MCH: 21.6 pg — ABNORMAL LOW (ref 26.0–34.0)
MCH: 21.8 pg — ABNORMAL LOW (ref 26.0–34.0)
MCHC: 28.6 g/dL — ABNORMAL LOW (ref 30.0–36.0)
MCHC: 28.6 g/dL — ABNORMAL LOW (ref 30.0–36.0)
MCV: 75.5 fL — ABNORMAL LOW (ref 80.0–100.0)
MCV: 76.2 fL — ABNORMAL LOW (ref 80.0–100.0)
Platelets: 388 10*3/uL (ref 150–400)
Platelets: 328 10*3/uL (ref 150–400)
RBC: 4.58 MIL/uL (ref 4.22–5.81)
RBC: 3.9 MIL/uL — ABNORMAL LOW (ref 4.22–5.81)
RDW: 16.9 % — ABNORMAL HIGH (ref 11.5–15.5)
RDW: 16.8 % — ABNORMAL HIGH (ref 11.5–15.5)
WBC: 10.5 10*3/uL (ref 4.0–10.5)
WBC: 9.8 10*3/uL (ref 4.0–10.5)
nRBC: 0 % (ref 0.0–0.2)
nRBC: 0 % (ref 0.0–0.2)

## 2022-05-12 LAB — HIV ANTIBODY (ROUTINE TESTING W REFLEX): HIV Screen 4th Generation wRfx: NONREACTIVE

## 2022-05-12 LAB — COMPREHENSIVE METABOLIC PANEL
ALT: 16 U/L (ref 0–44)
AST: 21 U/L (ref 15–41)
Albumin: 4.9 g/dL (ref 3.5–5.0)
Alkaline Phosphatase: 58 U/L (ref 38–126)
Anion gap: 7 (ref 5–15)
BUN: 15 mg/dL (ref 6–20)
CO2: 23 mmol/L (ref 22–32)
Calcium: 9.3 mg/dL (ref 8.9–10.3)
Chloride: 105 mmol/L (ref 98–111)
Creatinine, Ser: 0.84 mg/dL (ref 0.61–1.24)
GFR, Estimated: >60 mL/min (ref >60)
Glucose, Bld: 116 mg/dL — ABNORMAL HIGH (ref 70–99)
Potassium: 3.7 mmol/L (ref 3.5–5.1)
Sodium: 135 mmol/L (ref 135–145)
Total Bilirubin: 0.2 mg/dL — ABNORMAL LOW (ref 0.3–1.2)
Total Protein: 8.2 g/dL — ABNORMAL HIGH (ref 6.5–8.1)

## 2022-05-12 LAB — IRON AND TIBC
Iron: 26 ug/dL — ABNORMAL LOW (ref 45–182)
Saturation Ratios: 4 % — ABNORMAL LOW (ref 17.9–39.5)
TIBC: 587 ug/dL — ABNORMAL HIGH (ref 250–450)
UIBC: 561 ug/dL

## 2022-05-12 LAB — CREATININE, SERUM
Creatinine, Ser: 0.75 mg/dL (ref 0.61–1.24)
GFR, Estimated: >60 mL/min (ref >60)

## 2022-05-12 LAB — TYPE AND SCREEN
ABO/RH(D): A POS
Antibody Screen: NEGATIVE

## 2022-05-12 LAB — PROTIME-INR
INR: 1 (ref 0.8–1.2)
Prothrombin Time: 12.6 seconds (ref 11.4–15.2)

## 2022-05-12 LAB — LACTIC ACID, PLASMA: Lactic Acid, Venous: 1 mmol/L (ref 0.5–1.9)

## 2022-05-12 LAB — LIPASE, BLOOD: Lipase: 40 U/L (ref 11–51)

## 2022-05-12 MED ORDER — SODIUM CHLORIDE 0.9 % IV BOLUS
1000.0000 mL | Freq: Once | INTRAVENOUS | Status: AC
  Administered 2022-05-12: 1000 mL via INTRAVENOUS

## 2022-05-12 MED ORDER — ONDANSETRON HCL 4 MG PO TABS: 4.0000 mg | ORAL_TABLET | Freq: Four times a day (QID) | ORAL | Status: DC | PRN

## 2022-05-12 MED ORDER — SUCRALFATE 1 GM/10ML PO SUSP
1.0000 g | Freq: Three times a day (TID) | ORAL | Status: DC
  Administered 2022-05-13 – 2022-05-14 (×4): 1 g via ORAL
  Filled 2022-05-12 (×4): qty 10

## 2022-05-12 MED ORDER — PANTOPRAZOLE SODIUM 40 MG IV SOLR
40.0000 mg | Freq: Two times a day (BID) | INTRAVENOUS | Status: DC
  Administered 2022-05-12 – 2022-05-14 (×5): 40 mg via INTRAVENOUS
  Filled 2022-05-12 (×5): qty 10

## 2022-05-12 MED ORDER — BISACODYL 5 MG PO TBEC: 5.0000 mg | DELAYED_RELEASE_TABLET | Freq: Every day | ORAL | Status: DC | PRN

## 2022-05-12 MED ORDER — PANTOPRAZOLE SODIUM 40 MG IV SOLR
40.0000 mg | Freq: Once | INTRAVENOUS | Status: AC
  Administered 2022-05-12: 40 mg via INTRAVENOUS
  Filled 2022-05-12: qty 10

## 2022-05-12 MED ORDER — SODIUM CHLORIDE 0.9% FLUSH: 3.0000 mL | INTRAVENOUS | Status: DC | PRN

## 2022-05-12 MED ORDER — ZOLPIDEM TARTRATE 5 MG PO TABS: 5.0000 mg | ORAL_TABLET | Freq: Every evening | ORAL | Status: DC | PRN

## 2022-05-12 MED ORDER — LORAZEPAM 0.5 MG PO TABS
0.5000 mg | ORAL_TABLET | Freq: Four times a day (QID) | ORAL | Status: DC | PRN
Start: 2022-05-12 — End: 2022-05-12

## 2022-05-12 MED ORDER — LAMOTRIGINE 100 MG PO TABS
200.0000 mg | ORAL_TABLET | Freq: Every day | ORAL | Status: DC
  Filled 2022-05-12: qty 2

## 2022-05-12 MED ORDER — QUETIAPINE FUMARATE 25 MG PO TABS: 50.0000 mg | ORAL_TABLET | Freq: Every evening | ORAL | Status: DC | PRN

## 2022-05-12 MED ORDER — OXYCODONE HCL 5 MG PO TABS: 5.0000 mg | ORAL_TABLET | ORAL | Status: DC | PRN

## 2022-05-12 MED ORDER — LORAZEPAM 2 MG/ML IJ SOLN
0.5000 mg | INTRAMUSCULAR | Status: DC | PRN
Start: 2022-05-12 — End: 2022-05-14
  Administered 2022-05-12 – 2022-05-14 (×7): 0.5 mg via INTRAVENOUS
  Filled 2022-05-12 (×7): qty 1

## 2022-05-12 MED ORDER — DEXTROSE-NACL 5-0.45 % IV SOLN: INTRAVENOUS | Status: DC

## 2022-05-12 MED ORDER — LOSARTAN POTASSIUM 50 MG PO TABS
25.0000 mg | ORAL_TABLET | Freq: Every day | ORAL | Status: DC
Start: 2022-05-13 — End: 2022-05-14
  Administered 2022-05-14: 25 mg via ORAL
  Filled 2022-05-12: qty 1

## 2022-05-12 MED ORDER — IPRATROPIUM BROMIDE 0.02 % IN SOLN: 0.5000 mg | Freq: Four times a day (QID) | RESPIRATORY_TRACT | Status: DC | PRN

## 2022-05-12 MED ORDER — ESCITALOPRAM OXALATE 10 MG PO TABS
10.0000 mg | ORAL_TABLET | Freq: Every day | ORAL | Status: DC
Start: 2022-05-12 — End: 2022-05-12

## 2022-05-12 MED ORDER — SODIUM CHLORIDE 0.9% FLUSH
3.0000 mL | Freq: Two times a day (BID) | INTRAVENOUS | Status: DC
  Administered 2022-05-12 – 2022-05-14 (×3): 3 mL via INTRAVENOUS

## 2022-05-12 MED ORDER — IOHEXOL 300 MG/ML  SOLN
100.0000 mL | Freq: Once | INTRAMUSCULAR | Status: AC | PRN
  Administered 2022-05-12: 100 mL via INTRAVENOUS

## 2022-05-12 MED ORDER — SODIUM CHLORIDE 0.9 % IV SOLN: INTRAVENOUS | Status: DC

## 2022-05-12 MED ORDER — ESCITALOPRAM OXALATE 10 MG PO TABS
10.0000 mg | ORAL_TABLET | Freq: Every day | ORAL | Status: DC
  Administered 2022-05-13: 10 mg via ORAL
  Filled 2022-05-12: qty 1

## 2022-05-12 MED ORDER — BUSPIRONE HCL 5 MG PO TABS
30.0000 mg | ORAL_TABLET | Freq: Two times a day (BID) | ORAL | Status: DC
  Administered 2022-05-13 – 2022-05-14 (×2): 30 mg via ORAL
  Filled 2022-05-12 (×2): qty 6

## 2022-05-12 MED ORDER — ALBUTEROL SULFATE (2.5 MG/3ML) 0.083% IN NEBU
2.5000 mg | INHALATION_SOLUTION | Freq: Four times a day (QID) | RESPIRATORY_TRACT | Status: DC | PRN
Start: 2022-05-12 — End: 2022-05-14

## 2022-05-12 MED ORDER — SODIUM CHLORIDE 0.9% FLUSH
3.0000 mL | Freq: Two times a day (BID) | INTRAVENOUS | Status: DC
  Administered 2022-05-13 – 2022-05-14 (×3): 3 mL via INTRAVENOUS

## 2022-05-12 MED ORDER — ACETAMINOPHEN 650 MG RE SUPP: 650.0000 mg | Freq: Four times a day (QID) | RECTAL | Status: DC | PRN

## 2022-05-12 MED ORDER — SENNOSIDES-DOCUSATE SODIUM 8.6-50 MG PO TABS: 1.0000 | ORAL_TABLET | Freq: Every evening | ORAL | Status: DC | PRN

## 2022-05-12 MED ORDER — ACETAMINOPHEN 325 MG PO TABS: 650.0000 mg | ORAL_TABLET | Freq: Four times a day (QID) | ORAL | Status: DC | PRN

## 2022-05-12 MED ORDER — ONDANSETRON HCL 4 MG/2ML IJ SOLN
4.0000 mg | Freq: Once | INTRAMUSCULAR | Status: AC
  Administered 2022-05-12: 4 mg via INTRAVENOUS
  Filled 2022-05-12: qty 2

## 2022-05-12 MED ORDER — SODIUM CHLORIDE 0.9 % IV SOLN: 250.0000 mL | INTRAVENOUS | Status: DC | PRN

## 2022-05-12 MED ORDER — BUPRENORPHINE HCL 8 MG SL SUBL
8.0000 mg | SUBLINGUAL_TABLET | Freq: Three times a day (TID) | SUBLINGUAL | Status: DC
  Administered 2022-05-12 (×2): 8 mg via SUBLINGUAL
  Filled 2022-05-12 (×2): qty 1

## 2022-05-12 MED ORDER — HEPARIN SODIUM (PORCINE) 5000 UNIT/ML IJ SOLN: 5000.0000 [IU] | Freq: Three times a day (TID) | INTRAMUSCULAR | Status: DC

## 2022-05-12 MED ORDER — HYDRALAZINE HCL 20 MG/ML IJ SOLN: 10.0000 mg | INTRAMUSCULAR | Status: DC | PRN

## 2022-05-12 MED ORDER — ONDANSETRON HCL 4 MG/2ML IJ SOLN: 4.0000 mg | Freq: Four times a day (QID) | INTRAMUSCULAR | Status: DC | PRN

## 2022-05-12 MED ORDER — HYDROMORPHONE HCL 1 MG/ML IJ SOLN: 0.5000 mg | INTRAMUSCULAR | Status: DC | PRN

## 2022-05-12 MED ORDER — BUSPIRONE HCL 5 MG PO TABS: 30.0000 mg | ORAL_TABLET | Freq: Two times a day (BID) | ORAL | Status: DC

## 2022-05-12 NOTE — Hospital Course (Addendum)
Alan Jennings is a 41 year old male with extensive history of previous GI bleed, depression, anxiety, polypharmacy including Abilify, Suboxone, bupropion, citalopram, lamotrigine Seroquel presenting with anxiety stating that he has spitting coffee-ground material, minimum vomiting no frank bleeding with vomitus., not feeling well for the past 24 hours.  Denies of having any tarry black stool  States he had a gastric hemorrhage from the left gastric artery requiring coiling procedure interventional radiology at [facility] many years ago, 4 years ago has had GI bleed with coffee-ground emesis at EGD showed gastric erosions and bleeding.  Stating that he has been off Protonix since past month when he started noticing some burning sensation in his abdomen.   Denies any alcohol tobacco or drug use.  Denies of using NSAIDs.  ED: Blood pressure 130/77, pulse (!) 109, temperature 98.9 F (37.2 C), temperature source Oral, resp. rate 12, height 5\' 8"  (1.727 m), weight 68 kg, SpO2 100 %.    Latest Ref Rng & Units 05/12/2022    8:57 AM 05/16/2019   10:18 AM 05/16/2019    6:11 AM  CBC  WBC 4.0 - 10.5 K/uL 10.5    5.4    Hemoglobin 13.0 - 17.0 g/dL 9.9   9.1   9.3    Hematocrit 39.0 - 52.0 % 34.6   31.0   31.6    Platelets 150 - 400 K/uL 388    309        Latest Ref Rng & Units 05/12/2022    8:57 AM 05/16/2019    6:11 AM 01/23/2019   11:46 PM  BMP  Glucose 70 - 99 mg/dL 116   99   135    BUN 6 - 20 mg/dL 15   12   8     Creatinine 0.61 - 1.24 mg/dL 0.84   0.79   0.85    Sodium 135 - 145 mmol/L 135   138   139    Potassium 3.5 - 5.1 mmol/L 3.7   4.0   2.6    Chloride 98 - 111 mmol/L 105   102   108    CO2 22 - 32 mmol/L 23   21   21     Calcium 8.9 - 10.3 mg/dL 9.3   9.3   8.7     ED provider has discussed the case with gastroenterologist, who agreed to pursue with EGD later today..  Admitted for possible upper GI bleed.

## 2022-05-12 NOTE — Assessment & Plan Note (Addendum)
-  PTSD associated with depression anxiety and chronic pain -Home medications eviewed, will be resumed accordingly -Home medication includes: BuSpar, escitalopram, Lamictal, Suboxone - Also as needed Seroquel, Xanax.Marland Kitchen

## 2022-05-12 NOTE — Progress Notes (Signed)
  Transition of Care [facility]) Screening Note   Patient Details  Name: Alan Jennings Date of Birth: 1981-06-13   Transition of Care [facility]) CM/SW Contact:    Iona Beard, [place] Phone Number: 05/12/2022, 12:45 PM  TOC consulted for HH/DME needs. CSW noted per chart review that PT/OT has not been ordered at this time and has made no HH/DME recommendations. Consult will be cleared at this time. Please reconsult if a need arises.   Transition of Care Department [facility]) has reviewed patient and no TOC needs have been identified at this time. We will continue to monitor patient advancement through interdisciplinary progression rounds. If new patient transition needs arise, please place a TOC consult.

## 2022-05-12 NOTE — Consult Note (Signed)
Referring Provider: No ref. provider found Primary Care Physician:  Dionisio Paschal, NP Primary Gastroenterologist:  Dr. Laural Golden - distant.  Reason for Consultation:  coffee ground emesis  HPI: Patient is a 41 year old gentleman presenting to the ED with insidiously worsening epigastric pain, gastroesophageal reflux symptoms, nausea and vomiting.  Patient states he has had slowly worsening symptoms over the past 3 to 4 months.  Describes some early satiety, refractory reflux symptoms to over-the-counter acid suppressing agents.  States he can eat a meal and vomit the same food 3 days later.  Some coffee-ground appearance to the emesis.  He states he is lost 15 pounds in the past 3 months.  He describes no change in bowel habits -  having regular brown stool - specifically denies black stool or rectal bleeding. In the ED, he was evaluated by Dr. Sabra Heck.  He was initially tachycardic but that settled down spontaneously.  Otherwise, he has remained hemodynamically stable.  ED evaluation included a CT of the abdomen which demonstrated a dilated stomach-query recent meal intake or gastric outlet obstruction (pt reports last intake of solid food 24 hrs prior).  No other significant abnormalities. H&H at 0900 this morning  - 9.9/34.6/MCV 75.5; at 1135 H&H 8.5/29.7;  Hgb 9.1     2 years ago. Serum iron 26/TIBC 587/saturation 4%.  Patient has an extensive history of peptic ulcer disease.  He has been seen at multiple facilities including [facility] [place], our facility, and the [facility] [place].  About 4 years ago,  he presented to [facility] with life-threatening upper GI bleed.  He was airlifted to [place].  Ultimately he was found to have a [facility] 1 duodenal ulcer for which he underwent GDA embolization.  Was seen here in 2019 by Dr. Laural Golden.  EGD at that time for coffee-ground emesis demonstrated gastric erosions-H. pylori negative by histology.  Longstanding aspirin  containing and other NSAID product use and abuse.  He states he has not used any type of NSAID in the past couple of years.  He does take "Excedrin-tension headache" which does not contain aspirin.  Came off Protonix many months ago.  Started back with various OTC agents recently without much improvement in his presenting symptoms.  Distant history of pancreatitis-etiology uncertain.  Gallbladder remains in situ  He has a significant psychiatric history and is on multiple psychiatric medications.  (See below)  He also takes Subutex sublingually.  Past Medical History:  Diagnosis Date   Abdominal pain, chronic, epigastric    Anxiety    Arthritis    Back pain    Complication of anesthesia    had a syncopal episode after rhinoplasty   Depression    GERD (gastroesophageal reflux disease)    GI bleed    Headache    History of kidney stones    Hypertension    Neurogenic pain    Peptic ulcer disease    PTSD (post-traumatic stress disorder)    Seizures ([place])    Seizures ([place])     Past Surgical History:  Procedure Laterality Date   BIOPSY  06/25/2018   Procedure: BIOPSY;  Surgeon: Rogene Houston, MD;  Location: [facility];  Service: Endoscopy;;  gastric   ESOPHAGOGASTRODUODENOSCOPY (EGD) WITH PROPOFOL N/A 06/25/2018   Procedure: ESOPHAGOGASTRODUODENOSCOPY (EGD) WITH PROPOFOL;  Surgeon: Rogene Houston, MD;  Location: [facility];  Service: Endoscopy;  Laterality: N/A;   NOSE SURGERY     [facility]   ORIF HUMERUS FRACTURE  04/26/2012   Procedure: OPEN REDUCTION INTERNAL FIXATION (ORIF) PROXIMAL HUMERUS FRACTURE;  Surgeon: Carole Civil, MD;  Location: [facility] ORS;  Service: Orthopedics;  Laterality: Left;  Open Reduction Internal Fixation of Left Proximal Humerus Fracture, Closing Wedge Osteotomy, Bone Graft   RHINOPLASTY     [facility]   SHOULDER SURGERY      Prior to Admission medications   Medication Sig Start Date End Date Taking? Authorizing Provider  acetaminophen (TYLENOL) 500  MG tablet Take 1,500 mg by mouth every 6 (six) hours as needed for moderate pain.   Yes [provider]  buprenorphine (SUBUTEX) 8 MG SUBL SL tablet Dissolve 1 tablet under the tongue 3 times a day 04/15/22  Yes   busPIRone (BUSPAR) 30 MG tablet Take 1 tablet by mouth 2 times a day with food 04/15/22  Yes   escitalopram (LEXAPRO) 10 MG tablet Take 1 tablet by mouth once daily 04/15/22  Yes   lamoTRIgine (LAMICTAL) 200 MG tablet Take 1 tablet by mouth once daily 04/15/22  Yes   LORazepam (ATIVAN) 0.5 MG tablet Take 1 tablet (0.5 mg total) by mouth daily as needed for severe anxiety (must last 30 days per md) 04/18/21  Yes   pantoprazole (PROTONIX) 40 MG tablet Take 1 tablet by mouth 2 times a day 04/15/22  Yes   QUEtiapine (SEROQUEL) 50 MG tablet Take 1 tablet by mouth at bedtime as needed  for insomnia 09/05/21  Yes   ARIPiprazole (ABILIFY) 5 MG tablet Take 1 tablet (5 mg total) by mouth at bedtime. Patient not taking: Reported on 05/12/2022 01/22/22     HYDROcodone-acetaminophen (NORCO/VICODIN) 5-325 MG tablet Take 1 tablet by mouth every 6 (six) hours as needed for moderate pain. Patient not taking: Reported on 05/16/2019 12/25/18   Milton Ferguson, MD  losartan (COZAAR) 25 MG tablet TAKE 1 TABLET BY MOUTH ONCE A DAY 11/13/20 11/13/21  Dustin Flock, PA-C  QUEtiapine (SEROQUEL) 50 MG tablet Take 1 tablet by mouth at bedtime as needed for insomnia Patient not taking: Reported on 05/12/2022 04/15/22       Current Facility-Administered Medications  Medication Dose Route Frequency Provider Last Rate Last Admin   0.9 %  sodium chloride infusion   Intravenous Continuous Noemi Chapel, MD 125 mL/hr at 05/12/22 0928 New Bag at 05/12/22 0928   0.9 %  sodium chloride infusion   Intravenous Continuous Noemi Chapel, MD       0.9 %  sodium chloride infusion  250 mL Intravenous PRN Shahmehdi, Seyed A, MD       acetaminophen (TYLENOL) tablet 650 mg  650 mg Oral Q6H PRN Shahmehdi, Valeria Batman, MD       Or   acetaminophen  (TYLENOL) suppository 650 mg  650 mg Rectal Q6H PRN Shahmehdi, Seyed A, MD       albuterol (PROVENTIL) (2.5 MG/3ML) 0.083% nebulizer solution 2.5 mg  2.5 mg Nebulization Q6H PRN Shahmehdi, Seyed A, MD       bisacodyl (DULCOLAX) EC tablet 5 mg  5 mg Oral Daily PRN Shahmehdi, Seyed A, MD       buprenorphine (SUBUTEX) SL tablet 8 mg  8 mg Sublingual TID Shahmehdi, Seyed A, MD       busPIRone (BUSPAR) tablet 30 mg  30 mg Oral BID Shahmehdi, Seyed A, MD       escitalopram (LEXAPRO) tablet 10 mg  10 mg Oral QHS Shahmehdi, Seyed A, MD       hydrALAZINE (APRESOLINE) injection 10 mg  10 mg  Intravenous Q4H PRN Shahmehdi, Seyed A, MD       HYDROmorphone (DILAUDID) injection 0.5-1 mg  0.5-1 mg Intravenous Q2H PRN Shahmehdi, Seyed A, MD       ipratropium (ATROVENT) nebulizer solution 0.5 mg  0.5 mg Nebulization Q6H PRN Shahmehdi, Seyed A, MD       lamoTRIgine (LAMICTAL) tablet 200 mg  200 mg Oral Daily Shahmehdi, Seyed A, MD       LORazepam (ATIVAN) tablet 0.5 mg  0.5 mg Oral Q6H PRN Kendell Bane, MD       [START ON 05/13/2022] losartan (COZAAR) tablet 25 mg  25 mg Oral Daily Shahmehdi, Seyed A, MD       ondansetron (ZOFRAN) tablet 4 mg  4 mg Oral Q6H PRN Shahmehdi, Seyed A, MD       Or   ondansetron (ZOFRAN) injection 4 mg  4 mg Intravenous Q6H PRN Shahmehdi, Seyed A, MD       oxyCODONE (Oxy IR/ROXICODONE) immediate release tablet 5 mg  5 mg Oral Q4H PRN Shahmehdi, Seyed A, MD       pantoprazole (PROTONIX) injection 40 mg  40 mg Intravenous Q12H Shahmehdi, Seyed A, MD       QUEtiapine (SEROQUEL) tablet 50 mg  50 mg Oral QHS PRN Shahmehdi, Seyed A, MD       senna-docusate (Senokot-S) tablet 1 tablet  1 tablet Oral QHS PRN Shahmehdi, Seyed A, MD       sodium chloride flush (NS) 0.9 % injection 3 mL  3 mL Intravenous Q12H Shahmehdi, Seyed A, MD       sodium chloride flush (NS) 0.9 % injection 3 mL  3 mL Intravenous Q12H Shahmehdi, Seyed A, MD       sodium chloride flush (NS) 0.9 % injection 3 mL  3 mL  Intravenous PRN Shahmehdi, Seyed A, MD       sucralfate (CARAFATE) 1 GM/10ML suspension 1 g  1 g Oral TID WC & HS Shahmehdi, Seyed A, MD       zolpidem (AMBIEN) tablet 5 mg  5 mg Oral QHS PRN,MR X 1 Shahmehdi, Seyed A, MD       Current Outpatient Medications  Medication Sig Dispense Refill   acetaminophen (TYLENOL) 500 MG tablet Take 1,500 mg by mouth every 6 (six) hours as needed for moderate pain.     buprenorphine (SUBUTEX) 8 MG SUBL SL tablet Dissolve 1 tablet under the tongue 3 times a day 90 tablet 0   busPIRone (BUSPAR) 30 MG tablet Take 1 tablet by mouth 2 times a day with food 60 tablet 0   escitalopram (LEXAPRO) 10 MG tablet Take 1 tablet by mouth once daily 30 tablet 0   lamoTRIgine (LAMICTAL) 200 MG tablet Take 1 tablet by mouth once daily 30 tablet 0   LORazepam (ATIVAN) 0.5 MG tablet Take 1 tablet (0.5 mg total) by mouth daily as needed for severe anxiety (must last 30 days per md) 10 tablet 0   pantoprazole (PROTONIX) 40 MG tablet Take 1 tablet by mouth 2 times a day 60 tablet 0   QUEtiapine (SEROQUEL) 50 MG tablet Take 1 tablet by mouth at bedtime as needed  for insomnia 30 tablet 0   ARIPiprazole (ABILIFY) 5 MG tablet Take 1 tablet (5 mg total) by mouth at bedtime. (Patient not taking: Reported on 05/12/2022) 30 tablet 0   HYDROcodone-acetaminophen (NORCO/VICODIN) 5-325 MG tablet Take 1 tablet by mouth every 6 (six) hours as needed for moderate pain. (Patient not  taking: Reported on 05/16/2019) 20 tablet 0   losartan (COZAAR) 25 MG tablet TAKE 1 TABLET BY MOUTH ONCE A DAY 30 tablet 11   QUEtiapine (SEROQUEL) 50 MG tablet Take 1 tablet by mouth at bedtime as needed for insomnia (Patient not taking: Reported on 05/12/2022) 30 tablet 0    Allergies as of 05/12/2022 - Review Complete 05/12/2022  Allergen Reaction Noted   Tramadol Other (See Comments)    Nsaids  09/19/2018    Family History  Problem Relation Age of Onset   Heart disease Other    Arthritis Other    Anesthesia  problems Neg Hx    Hypotension Neg Hx    Malignant hyperthermia Neg Hx    Pseudochol deficiency Neg Hx     Social History   Socioeconomic History   Marital status: Single    Spouse name: Not on file   Number of children: Not on file   Years of education: college   Highest education level: Not on file  Occupational History   Occupation: none    Employer: SELF EMPLOYED  Tobacco Use   Smoking status: Former    Packs/day: 0.50    Years: 7.00    Pack years: 3.50    Types: E-cigarettes, Cigarettes   Smokeless tobacco: Never  Vaping Use   Vaping Use: Every day  Substance and Sexual Activity   Alcohol use: No   Drug use: Not Currently    Types: Marijuana   Sexual activity: Not on file  Other Topics Concern   Not on file  Social History Narrative   Not on file   Social Determinants of Health   Financial Resource Strain: Not on file  Food Insecurity: Not on file  Transportation Needs: Not on file  Physical Activity: Not on file  Stress: Not on file  Social Connections: Not on file  Intimate Partner Violence: Not on file    Review of Systems:  As in the HPI   Physical Exam: Vital signs in last 24 hours: Temp:  [98.9 F (37.2 C)] 98.9 F (37.2 C) (05/29 0858) Pulse Rate:  [94-128] 109 (05/29 1102) Resp:  [10-12] 12 (05/29 1102) BP: (128-159)/(76-98) 130/77 (05/29 1102) SpO2:  [100 %] 100 % (05/29 1102) Weight:  [68 kg] 68 kg (05/29 0854)   General:   Alert,  pleasant and cooperative in NAD; seen in room 305. Head:  Normocephalic and atraumatic. Eyes:  Sclera clear, no icterus.   Conjunctiva pink. Lungs:  Clear throughout to auscultation.   No wheezes, crackles, or rhonchi. No acute distress. Heart:  Regular rate and rhythm; no murmurs, clicks, rubs,  or gallops. Abdomen: Nondistended positive bowel sounds.  No succussion splash.  Minimal epigastric tenderness. Intake/Output from previous day: No intake/output data recorded. Intake/Output this shift: Total  I/O In: 2000 [IV Piggyback:2000] Out: -   Lab Results: Recent Labs    05/12/22 0857  WBC 10.5  HGB 9.9*  HCT 34.6*  PLT 388   BMET Recent Labs    05/12/22 0857  NA 135  K 3.7  CL 105  CO2 23  GLUCOSE 116*  BUN 15  CREATININE 0.84  CALCIUM 9.3   LFT Recent Labs    05/12/22 0857  PROT 8.2*  ALBUMIN 4.9  AST 21  ALT 16  ALKPHOS 58  BILITOT 0.2*   PT/INR Recent Labs    05/12/22 0857  LABPROT 12.6  INR 1.0   Hepatitis Panel No results for input(s): HEPBSAG, HCVAB, HEPAIGM, HEPBIGM in  the last 72 hours. C-Diff No results for input(s): CDIFFTOX in the last 72 hours.  Studies/Results: CT ABDOMEN PELVIS W CONTRAST  Result Date: 05/12/2022 CLINICAL DATA:  Acute abdominal pain. Hematemesis. Previous gastric ulcers and gastric artery embolization. EXAM: CT ABDOMEN AND PELVIS WITH CONTRAST TECHNIQUE: Multidetector CT imaging of the abdomen and pelvis was performed using the standard protocol following bolus administration of intravenous contrast. RADIATION DOSE REDUCTION: This exam was performed according to the departmental dose-optimization program which includes automated exposure control, adjustment of the mA and/or kV according to patient size and/or use of iterative reconstruction technique. CONTRAST:  182mL OMNIPAQUE IOHEXOL 300 MG/ML  SOLN COMPARISON:  05/16/2019 FINDINGS: Lower Chest: No acute findings. Hepatobiliary: No hepatic masses identified. Gallbladder is unremarkable. No evidence of biliary ductal dilatation. Pancreas:  No mass or inflammatory changes. Spleen: Within normal limits in size and appearance. Adrenals/Urinary Tract: No masses identified. No evidence of ureteral calculi or hydronephrosis. Stomach/Bowel: Stomach is distended, however there is no evidence of dilated bowel loops. No evidence of inflammatory process or abnormal fluid collections. Vascular/Lymphatic: No pathologically enlarged lymph nodes. Embolization coils noted in the expected region  of the gastroduodenal artery. No acute vascular findings. Reproductive:  No mass or other significant abnormality. Other:  None. Musculoskeletal:  No suspicious bone lesions identified. IMPRESSION: Distended stomach, which may be due to recent p.o. intake although gastroparesis or gastric outlet obstruction cannot be excluded. Clinical correlation is recommended. No other significant abnormality identified. Electronically Signed   By: Marlaine Hind M.D.   On: 05/12/2022 10:28    Impression: Pleasant 41 year old gentleman with a history of complicated NSAID induced peptic ulcer disease presents to the hospital with insidiously worsening nausea, coffee-ground emesis, reflux symptoms and epigastric pain over the past 3 months.  Reported 15 pound weight loss.  Evaluation in the ED revealed acute on chronic iron deficiency anemia.  He remains hemodynamically stable  Significantly dilated stomach on cross-sectional imaging -query gastric outlet obstruction/delayed gastric emptying and/or recent meal ingestion (patient denies latter).  He is on multiple psychiatric medications as chronicled above.  He is also on Subutex.  I suspect he has had an element of recent GI bleeding.  However, I am more impressed that he has symptomatic delayed gastric emptying from either a gastric outlet obstruction or perhaps medication induced gastroparesis (or potentially combination of the two)  Recommendations:  He needs an EGD.  However, if we proceed now, I suspect we will find retained gastric contents. I do not believe he would tolerate an NG tube.  Moreover, would hold off on empiric prokinetic therapy until it is determined whether or not he has a mechanical gastric outlet obstruction.  I discussed with Dr.Shahmehdi and patient's nurse, Jerene Pitch.  For now, I recommend we keep him strictly n.p.o.(including no meds per oral except for his sublingual Subutex)  IV maintenance fluids.  IV proton pump inhibitor therapy  twice daily  Plan for diagnostic EGD tomorrow.  Patient understands that Dr. Jenetta Downer will perform in my absence.  I discussed the risk, benefits, limitations and alternatives with the patient.  His questions were answered.  He is agreeable.  Further recommendations to follow after endoscopic evaluation has been carried out.             Notice:  This dictation was prepared with Dragon dictation along with smaller phrase technology. Any transcriptional errors that result from this process are unintentional and may not be corrected upon review.

## 2022-05-12 NOTE — Assessment & Plan Note (Signed)
-   continue home medication of Lamictal 

## 2022-05-12 NOTE — Assessment & Plan Note (Addendum)
-   Currently on Suboxone, will be continued -As needed analgesics 

## 2022-05-12 NOTE — ED Provider Notes (Signed)
[facility] EMERGENCY DEPARTMENT Provider Note   CSN: [id number] Arrival date & time: 05/12/22  [id number]     History  Chief Complaint  Patient presents with   Hematemesis    Alan Jennings is a 41 y.o. male.  HPI  This patient is a 41 year old male, he is on several different medications including Abilify, Subutex, buspirone, escitalopram and lamotrigine.  He also takes Seroquel.  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  He had presented here about 4 years ago during which time he was having some upper GI bleeding and coffee-ground emesis and had an EGD that showed some gastric erosions but nothing that was bleeding.  He states that he had been taking a proton pump inhibitor at that time but since that time no longer takes the proton pump inhibitor until about a month ago when he started to notice a burning sensation in his abdomen again.  He denies alcohol use, tobacco use but does have a history of significant anti-inflammatory use during his previous upper GI bleeding episodes.  He now takes Excedrin occasionally for headache but does not take any other anti-inflammatories.  He denies seeing any bright red blood in his vomit but has had some coffee-ground emesis, he states his stools have generally been normal brown stools  Home Medications Prior to Admission medications   Medication Sig Start Date End Date Taking? Authorizing Provider  acetaminophen (TYLENOL) 500 MG tablet Take 1,500 mg by mouth every 6 (six) hours as needed for moderate pain.   Yes [provider]  buprenorphine (SUBUTEX) 8 MG SUBL SL tablet Dissolve 1 tablet under the tongue 3 times a day 04/15/22  Yes   busPIRone (BUSPAR) 30 MG tablet Take 1 tablet  by mouth 2 times a day with food 04/15/22  Yes   escitalopram (LEXAPRO) 10 MG tablet Take 1 tablet by mouth once daily 04/15/22  Yes   lamoTRIgine (LAMICTAL) 200 MG tablet Take 1 tablet by mouth once daily 04/15/22  Yes   LORazepam (ATIVAN) 0.5 MG tablet Take 1 tablet (0.5 mg total) by mouth daily as needed for severe anxiety (must last 30 days per md) 04/18/21  Yes   pantoprazole (PROTONIX) 40 MG tablet Take 1 tablet by mouth 2 times a day 04/15/22  Yes   QUEtiapine (SEROQUEL) 50 MG tablet Take 1 tablet by mouth at bedtime as needed  for insomnia 09/05/21  Yes   ARIPiprazole (ABILIFY) 5 MG tablet Take 1 tablet (5 mg total) by mouth at bedtime. Patient not taking: Reported on 05/12/2022 01/22/22     HYDROcodone-acetaminophen (NORCO/VICODIN) 5-325 MG tablet Take 1 tablet by mouth every 6 (six) hours as needed for moderate pain. Patient not taking: Reported on 05/16/2019 12/25/18   Milton Ferguson, MD  lamoTRIgine (LAMICTAL) 100 MG tablet TAKE 1 TABLET BY MOUTH ONCE A DAY (DONT RESTART IF MEDICATION HAS NOT BEEN TAKEN FOR 1 WEEK, CALL DR IF RASH APPEARS) 02/21/21 02/21/22    lamoTRIgine (LAMICTAL) 100 MG tablet TAKE 1 TABLET BY MOUTH ONCE A DAY * DO NOT RESTART IF MEDICATION HAS NOT BEEN TAKEN FOR 1 WEEK- CALL PROVIDER IF RASH APPEARS* 01/24/21 01/24/22    lamoTRIgine (LAMICTAL) 25 MG tablet TAKE 1 TABLET BY MOUTH ONCE A DAY FOR 14 DAYS, 2 TABS DAILY  DAILY 14 DAYS, THEN 4 TABLETS DAILY 12/27/20 12/27/21    lamoTRIgine (LAMICTAL) 25 MG tablet TAKE 1 TABLET BY MOUTH ONCE A DAY X 14 DAYS, THEN 2 TABLETS BY MOUTH ONCE A DAY X 14 DAYS, THEN TAKE 4 TABLETS BY MOUTH ONCE A DAY 12/13/20 12/13/21    losartan (COZAAR) 25 MG tablet TAKE 1 TABLET BY MOUTH ONCE A DAY 11/13/20 11/13/21  Dustin Flock, PA-C  QUEtiapine (SEROQUEL) 50 MG tablet Take 1 tablet by mouth nightly at bedtime as needed for insomnia Patient not taking: Reported on 05/12/2022 10/03/21     QUEtiapine (SEROQUEL) 50 MG tablet Take 1 tablet by mouth at bedtime as needed  for insomnia Patient not taking: Reported on 05/12/2022 03/18/22     QUEtiapine (SEROQUEL) 50 MG tablet Take 1 tablet by mouth at bedtime as needed for insomnia Patient not taking: Reported on 05/12/2022 04/15/22         Allergies    Tramadol and Nsaids    Review of Systems   Review of Systems  All other systems reviewed and are negative.  Physical Exam Updated Vital Signs BP 130/77 (BP Location: Right Arm)   Pulse (!) 109   Temp 98.9 F (37.2 C) (Oral)   Resp 12   Ht 1.727 m (5\' 8" )   Wt 68 kg   SpO2 100%   BMI 22.81 kg/m  Physical Exam Vitals and nursing note reviewed.  Constitutional:      General: He is not in acute distress.    Appearance: He is well-developed.  HENT:     Head: Normocephalic and atraumatic.     Mouth/Throat:     Pharynx: No oropharyngeal exudate.  Eyes:     General: No scleral icterus.       Right eye: No discharge.        Left eye: No discharge.     Pupils: Pupils are equal, round, and reactive to light.     Comments: Pale conjunctive a  Neck:     Thyroid: No thyromegaly.     Vascular: No JVD.  Cardiovascular:     Rate and Rhythm: Regular rhythm. Tachycardia present.     Heart sounds: Normal heart sounds. No murmur heard.   No friction rub. No gallop.     Comments: Heart rate close to 140 bpm Pulmonary:     Effort: Pulmonary effort is normal. No respiratory distress.     Breath sounds: Normal breath sounds. No wheezing or rales.  Abdominal:     General: Bowel sounds are normal. There is no distension.     Palpations: Abdomen is soft. There is no mass.     Tenderness: There is abdominal tenderness.     Comments: Tenderness mid to upper abdomen, no tenderness in the lower abdomen, no Murphy sign, no peritoneal signs  Musculoskeletal:        General: No tenderness. Normal range of motion.     Cervical back: Normal range of motion and neck supple.     Right lower leg: No edema.     Left lower leg: No edema.  Lymphadenopathy:     Cervical: No  cervical adenopathy.  Skin:    General: Skin is warm and dry.     Findings: No erythema or rash.  Neurological:     General: No focal deficit present.     Mental Status: He is alert.     Coordination: Coordination normal.  Psychiatric:        Behavior: Behavior normal.  ED Results / Procedures / Treatments   Labs (all labs ordered are listed, but only abnormal results are displayed) Labs Reviewed  COMPREHENSIVE METABOLIC PANEL - Abnormal; Notable for the following components:      Result Value   Glucose, Bld 116 (*)    Total Protein 8.2 (*)    Total Bilirubin 0.2 (*)    All other components within normal limits  CBC - Abnormal; Notable for the following components:   Hemoglobin 9.9 (*)    HCT 34.6 (*)    MCV 75.5 (*)    MCH 21.6 (*)    MCHC 28.6 (*)    RDW 16.9 (*)    All other components within normal limits  PROTIME-INR  LACTIC ACID, PLASMA  LIPASE, BLOOD  CBC  CREATININE, SERUM  HIV ANTIBODY (ROUTINE TESTING W REFLEX)  POC OCCULT BLOOD, ED  TYPE AND SCREEN    EKG None  Radiology CT ABDOMEN PELVIS W CONTRAST  Result Date: 05/12/2022 CLINICAL DATA:  Acute abdominal pain. Hematemesis. Previous gastric ulcers and gastric artery embolization. EXAM: CT ABDOMEN AND PELVIS WITH CONTRAST TECHNIQUE: Multidetector CT imaging of the abdomen and pelvis was performed using the standard protocol following bolus administration of intravenous contrast. RADIATION DOSE REDUCTION: This exam was performed according to the departmental dose-optimization program which includes automated exposure control, adjustment of the mA and/or kV according to patient size and/or use of iterative reconstruction technique. CONTRAST:  126mL OMNIPAQUE IOHEXOL 300 MG/ML  SOLN COMPARISON:  05/16/2019 FINDINGS: Lower Chest: No acute findings. Hepatobiliary: No hepatic masses identified. Gallbladder is unremarkable. No evidence of biliary ductal dilatation. Pancreas:  No mass or inflammatory changes. Spleen:  Within normal limits in size and appearance. Adrenals/Urinary Tract: No masses identified. No evidence of ureteral calculi or hydronephrosis. Stomach/Bowel: Stomach is distended, however there is no evidence of dilated bowel loops. No evidence of inflammatory process or abnormal fluid collections. Vascular/Lymphatic: No pathologically enlarged lymph nodes. Embolization coils noted in the expected region of the gastroduodenal artery. No acute vascular findings. Reproductive:  No mass or other significant abnormality. Other:  None. Musculoskeletal:  No suspicious bone lesions identified. IMPRESSION: Distended stomach, which may be due to recent p.o. intake although gastroparesis or gastric outlet obstruction cannot be excluded. Clinical correlation is recommended. No other significant abnormality identified. Electronically Signed   By: Marlaine Hind M.D.   On: 05/12/2022 10:28    Procedures Procedures    Medications Ordered in ED Medications  0.9 %  sodium chloride infusion ( Intravenous New Bag/Given 05/12/22 0928)  sodium chloride 0.9 % bolus 1,000 mL (0 mLs Intravenous Stopped 05/12/22 1022)    And  sodium chloride 0.9 % bolus 1,000 mL (0 mLs Intravenous Stopped 05/12/22 1103)    And  0.9 %  sodium chloride infusion (has no administration in time range)  sodium chloride flush (NS) 0.9 % injection 3 mL (has no administration in time range)  0.9 %  sodium chloride infusion (has no administration in time range)  sodium chloride flush (NS) 0.9 % injection 3 mL (has no administration in time range)  sodium chloride flush (NS) 0.9 % injection 3 mL (has no administration in time range)  0.9 %  sodium chloride infusion (has no administration in time range)  acetaminophen (TYLENOL) tablet 650 mg (has no administration in time range)    Or  acetaminophen (TYLENOL) suppository 650 mg (has no administration in time range)  oxyCODONE (Oxy IR/ROXICODONE) immediate release tablet 5 mg (has no administration in  time  range)  HYDROmorphone (DILAUDID) injection 0.5-1 mg (has no administration in time range)  zolpidem (AMBIEN) tablet 5 mg (has no administration in time range)  senna-docusate (Senokot-S) tablet 1 tablet (has no administration in time range)  bisacodyl (DULCOLAX) EC tablet 5 mg (has no administration in time range)  ondansetron (ZOFRAN) tablet 4 mg (has no administration in time range)    Or  ondansetron (ZOFRAN) injection 4 mg (has no administration in time range)  ipratropium (ATROVENT) nebulizer solution 0.5 mg (has no administration in time range)  hydrALAZINE (APRESOLINE) injection 10 mg (has no administration in time range)  albuterol (PROVENTIL) (5 MG/ML) 0.5% nebulizer solution 2.5 mg (has no administration in time range)  pantoprazole (PROTONIX) injection 40 mg (has no administration in time range)  pantoprazole (PROTONIX) injection 40 mg (40 mg Intravenous Given 05/12/22 0933)  ondansetron (ZOFRAN) injection 4 mg (4 mg Intravenous Given 05/12/22 0932)  iohexol (OMNIPAQUE) 300 MG/ML solution 100 mL (100 mLs Intravenous Contrast Given 05/12/22 1015)    ED Course/ Medical Decision Making/ A&P                           Medical Decision Making Amount and/or Complexity of Data Reviewed Labs: ordered. Radiology: ordered.  Risk Prescription drug management. Decision regarding hospitalization.   This patient presents to the ED for concern of possible recurrent GI bleed, significant gastritis or esophageal reflux., this involves an extensive number of treatment options, and is a complaint that carries with it a high risk of complications and morbidity.  The differential diagnosis includes gastritis, peptic ulcer disease, recurrent gastrointestinal hemorrhage, pancreatitis   Co morbidities that complicate the patient evaluation  Prior history of GI bleed, history of anti-inflammatory use   Additional history obtained:  Additional history obtained from electronic medical  record External records from outside source obtained and reviewed including prior endoscopies, prior admissions to the hospital   Lab Tests:  I Ordered, and personally interpreted labs.  The pertinent results include: CBC metabolic panel type and screen lipase, anemia, hemoglobin just under 10, platelets are normal, metabolic panel unremarkable, type and screen sent   Cardiac Monitoring: / EKG:  The patient was maintained on a cardiac monitor.  I personally viewed and interpreted the cardiac monitored which showed an underlying rhythm of: Sinus tachycardia   Consultations Obtained:  I requested consultation with the Dr. Gala Romney of the GI service who would like the patient to be admitted and he will consult and likely take to endoscopy this afternoon, discussed with Dr. Roger Shelter who will admit,  and discussed lab and imaging findings as well as pertinent plan - they recommend: Admit   Problem List / ED Course / Critical interventions / Medication management  Patient was given Protonix and IV fluids, kept n.p.o. I ordered medication including Protonix and IV fluids for anemia upper GI bleeding and need for endoscopy, patient appears somewhat dehydrated Reevaluation of the patient after these medicines showed that the patient improved I have reviewed the patients home medicines and have made adjustments as needed   Social Determinants of Health:  History of GI bleed, history of psychiatric illness   Test / Admission - Considered:  We will admit to the hospital         Final Clinical Impression(s) / ED Diagnoses Final diagnoses:  Anemia, unspecified type  Upper GI bleed    Rx / DC Orders ED Discharge Orders     None  Noemi Chapel, MD 05/12/22 [id number]

## 2022-05-12 NOTE — Assessment & Plan Note (Signed)
-   Stable continue home medication of losartan

## 2022-05-12 NOTE — H&P (Signed)
History and Physical   Patient: Alan Jennings                            PCP: Dionisio Paschal, NP                    DOB: 03/29/1981            DOA: 05/12/2022 [id number]             DOS: 05/12/2022, 1:24 PM  Dionisio Paschal, NP  Patient coming from:   HOME  I have personally reviewed patient's medical records, in electronic medical records, including:  [facility], and care everywhere.    Chief Complaint:   Chief Complaint  Patient presents with   Hematemesis    History of present illness:    Alan Jennings is a 41 year old male with extensive history of previous GI bleed, depression, anxiety, polypharmacy including Abilify, Suboxone, bupropion, citalopram, lamotrigine Seroquel presenting with anxiety stating that he has spitting coffee-ground material, minimum vomiting no frank bleeding with vomitus., not feeling well for the past 24 hours.  Denies of having any tarry black stool  States he had a gastric hemorrhage from the left gastric artery requiring coiling procedure interventional radiology at [facility] many years ago, 4 years ago has had GI bleed with coffee-ground emesis at EGD showed gastric erosions and bleeding.  Stating that he has been off Protonix since past month when he started noticing some burning sensation in his abdomen.   Denies any alcohol tobacco or drug use.  Denies of using NSAIDs.  ED: Blood pressure 130/77, pulse (!) 109, temperature 98.9 F (37.2 C), temperature source Oral, resp. rate 12, height 5\' 8"  (1.727 m), weight 68 kg, SpO2 100 %.    Latest Ref Rng & Units 05/12/2022    8:57 AM 05/16/2019   10:18 AM 05/16/2019    6:11 AM  CBC  WBC 4.0 - 10.5 K/uL 10.5    5.4    Hemoglobin 13.0 - 17.0 g/dL 9.9   9.1   9.3    Hematocrit 39.0 - 52.0 % 34.6   31.0   31.6    Platelets 150 - 400 K/uL 388    309        Latest Ref Rng & Units 05/12/2022    8:57 AM 05/16/2019    6:11 AM 01/23/2019   11:46 PM  BMP  Glucose 70 - 99 mg/dL 116   99   135     BUN 6 - 20 mg/dL 15   12   8     Creatinine 0.61 - 1.24 mg/dL 0.84   0.79   0.85    Sodium 135 - 145 mmol/L 135   138   139    Potassium 3.5 - 5.1 mmol/L 3.7   4.0   2.6    Chloride 98 - 111 mmol/L 105   102   108    CO2 22 - 32 mmol/L 23   21   21     Calcium 8.9 - 10.3 mg/dL 9.3   9.3   8.7     ED provider has discussed the case with gastroenterologist, who agreed to pursue with EGD later today..  Admitted for possible upper GI bleed.   Patient Denies having: Fever, Chills, Cough, SOB, Chest Pain, N/V/D, headache, dizziness, lightheadedness,  Dysuria, Joint pain, rash, open wounds    Review of Systems: As per HPI,  otherwise 10 point review of systems were negative.   ----------------------------------------------------------------------------------------------------------------------  Allergies  Allergen Reactions   Tramadol Other (See Comments)    SEIZURES   Nsaids     Has caused ulcers and needs to avoid.    Home MEDs:  Prior to Admission medications   Medication Sig Start Date End Date Taking? Authorizing Provider  acetaminophen (TYLENOL) 500 MG tablet Take 1,500 mg by mouth every 6 (six) hours as needed for moderate pain.   Yes [provider]  buprenorphine (SUBUTEX) 8 MG SUBL SL tablet Dissolve 1 tablet under the tongue 3 times a day 04/15/22  Yes   busPIRone (BUSPAR) 30 MG tablet Take 1 tablet by mouth 2 times a day with food 04/15/22  Yes   escitalopram (LEXAPRO) 10 MG tablet Take 1 tablet by mouth once daily 04/15/22  Yes   lamoTRIgine (LAMICTAL) 200 MG tablet Take 1 tablet by mouth once daily 04/15/22  Yes   LORazepam (ATIVAN) 0.5 MG tablet Take 1 tablet (0.5 mg total) by mouth daily as needed for severe anxiety (must last 30 days per md) 04/18/21  Yes   pantoprazole (PROTONIX) 40 MG tablet Take 1 tablet by mouth 2 times a day 04/15/22  Yes   QUEtiapine (SEROQUEL) 50 MG tablet Take 1 tablet by mouth at bedtime as needed  for insomnia 09/05/21  Yes   ARIPiprazole  (ABILIFY) 5 MG tablet Take 1 tablet (5 mg total) by mouth at bedtime. Patient not taking: Reported on 05/12/2022 01/22/22     HYDROcodone-acetaminophen (NORCO/VICODIN) 5-325 MG tablet Take 1 tablet by mouth every 6 (six) hours as needed for moderate pain. Patient not taking: Reported on 05/16/2019 12/25/18   Milton Ferguson, MD  losartan (COZAAR) 25 MG tablet TAKE 1 TABLET BY MOUTH ONCE A DAY 11/13/20 11/13/21  Dustin Flock, PA-C  QUEtiapine (SEROQUEL) 50 MG tablet Take 1 tablet by mouth at bedtime as needed for insomnia Patient not taking: Reported on 05/12/2022 04/15/22       PRN MEDs: sodium chloride, acetaminophen **OR** acetaminophen, albuterol, bisacodyl, hydrALAZINE, HYDROmorphone (DILAUDID) injection, ipratropium, LORazepam, ondansetron **OR** ondansetron (ZOFRAN) IV, oxyCODONE, QUEtiapine, senna-docusate, sodium chloride flush, zolpidem  Past Medical History:  Diagnosis Date   Abdominal pain, chronic, epigastric    Anxiety    Arthritis    Back pain    Complication of anesthesia    had a syncopal episode after rhinoplasty   Depression    GERD (gastroesophageal reflux disease)    GI bleed    Headache    History of kidney stones    Hypertension    Neurogenic pain    Peptic ulcer disease    PTSD (post-traumatic stress disorder)    Seizures ([place])    Seizures ([place])     Past Surgical History:  Procedure Laterality Date   BIOPSY  06/25/2018   Procedure: BIOPSY;  Surgeon: Rogene Houston, MD;  Location: [facility];  Service: Endoscopy;;  gastric   ESOPHAGOGASTRODUODENOSCOPY (EGD) WITH PROPOFOL N/A 06/25/2018   Procedure: ESOPHAGOGASTRODUODENOSCOPY (EGD) WITH PROPOFOL;  Surgeon: Rogene Houston, MD;  Location: [facility];  Service: Endoscopy;  Laterality: N/A;   NOSE SURGERY     [place]   ORIF HUMERUS FRACTURE  04/26/2012   Procedure: OPEN REDUCTION INTERNAL FIXATION (ORIF) PROXIMAL HUMERUS FRACTURE;  Surgeon: Carole Civil, MD;  Location: [facility] ORS;  Service: Orthopedics;   Laterality: Left;  Open Reduction Internal Fixation of Left Proximal Humerus Fracture, Closing Wedge Osteotomy, Bone Graft   RHINOPLASTY  [place]   SHOULDER SURGERY       reports that he has quit smoking. His smoking use included e-cigarettes. He has a 3.50 pack-year smoking history. He has never used smokeless tobacco. He reports that he does not currently use drugs after having used the following drugs: Marijuana. He reports that he does not drink alcohol.   Family History  Problem Relation Age of Onset   Heart disease Other    Arthritis Other    Anesthesia problems Neg Hx    Hypotension Neg Hx    Malignant hyperthermia Neg Hx    Pseudochol deficiency Neg Hx     Physical Exam:   Vitals:   05/12/22 0915 05/12/22 1000 05/12/22 1102 05/12/22 1250  BP: (!) 159/98 128/76 130/77 (!) 150/103  Pulse: (!) 128 94 (!) 109 89  Resp: 11 10 12 16   Temp:    98.8 F (37.1 C)  TempSrc:    Oral  SpO2: 100% 100% 100% 99%  Weight:      Height:    5\' 8"  (1.727 m)   Constitutional: NAD, calm, comfortable Eyes: PERRL, lids and conjunctivae normal ENMT: Mucous membranes are moist. Posterior pharynx clear of any exudate or lesions.Normal dentition.  Neck: normal, supple, no masses, no thyromegaly Respiratory: clear to auscultation bilaterally, no wheezing, no crackles. Normal respiratory effort. No accessory muscle use.  Cardiovascular: Regular rate and rhythm, no murmurs / rubs / gallops. No extremity edema. 2+ pedal pulses. No carotid bruits.  Abdomen: no tenderness, no masses palpated. No hepatosplenomegaly. Bowel sounds positive.  Musculoskeletal: no clubbing / cyanosis. No joint deformity upper and lower extremities. Good ROM, no contractures. Normal muscle tone.  Neurologic: CN II-XII grossly intact. Sensation intact, DTR normal. Strength 5/5 in all 4.  Psychiatric: Normal judgment and insight. Alert and oriented x 3. Normal mood.  Skin: no rashes, lesions, ulcers. No  induration Decubitus/ulcers:  Wounds: per nursing documentation     Labs on admission:    I have personally reviewed following labs and imaging studies  CBC: Recent Labs  Lab 05/12/22 0857 05/12/22 1135  WBC 10.5 9.8  HGB 9.9* 8.5*  HCT 34.6* 29.7*  MCV 75.5* 76.2*  PLT 388 [id number]   Basic Metabolic Panel: Recent Labs  Lab 05/12/22 0857  NA 135  K 3.7  CL 105  CO2 23  GLUCOSE 116*  BUN 15  CREATININE 0.84  CALCIUM 9.3   GFR: Estimated Creatinine Clearance: 112.4 mL/min (by C-G formula based on SCr of 0.84 mg/dL). Liver Function Tests: Recent Labs  Lab 05/12/22 0857  AST 21  ALT 16  ALKPHOS 58  BILITOT 0.2*  PROT 8.2*  ALBUMIN 4.9   Recent Labs  Lab 05/12/22 0857  LIPASE 40   No results for input(s): AMMONIA in the last 168 hours. Coagulation Profile: Recent Labs  Lab 05/12/22 0857  INR 1.0       Component Value Date/Time   COLORURINE COLORLESS (A) 05/16/2019 0611   APPEARANCEUR CLEAR 05/16/2019 0611   LABSPEC 1.003 (L) 05/16/2019 0611   PHURINE 8.0 05/16/2019 0611   GLUCOSEU NEGATIVE 05/16/2019 0611   HGBUR NEGATIVE 05/16/2019 0611   BILIRUBINUR NEGATIVE 05/16/2019 0611   KETONESUR NEGATIVE 05/16/2019 0611   PROTEINUR NEGATIVE 05/16/2019 0611   UROBILINOGEN 0.2 02/18/2012 1548   NITRITE NEGATIVE 05/16/2019 0611   LEUKOCYTESUR NEGATIVE 05/16/2019 0611    Last A1C:  No results found for: HGBA1C   Radiologic Exams on Admission:   CT ABDOMEN PELVIS W CONTRAST  Result  Date: 05/12/2022 CLINICAL DATA:  Acute abdominal pain. Hematemesis. Previous gastric ulcers and gastric artery embolization. EXAM: CT ABDOMEN AND PELVIS WITH CONTRAST TECHNIQUE: Multidetector CT imaging of the abdomen and pelvis was performed using the standard protocol following bolus administration of intravenous contrast. RADIATION DOSE REDUCTION: This exam was performed according to the departmental dose-optimization program which includes automated exposure control,  adjustment of the mA and/or kV according to patient size and/or use of iterative reconstruction technique. CONTRAST:  167mL OMNIPAQUE IOHEXOL 300 MG/ML  SOLN COMPARISON:  05/16/2019 FINDINGS: Lower Chest: No acute findings. Hepatobiliary: No hepatic masses identified. Gallbladder is unremarkable. No evidence of biliary ductal dilatation. Pancreas:  No mass or inflammatory changes. Spleen: Within normal limits in size and appearance. Adrenals/Urinary Tract: No masses identified. No evidence of ureteral calculi or hydronephrosis. Stomach/Bowel: Stomach is distended, however there is no evidence of dilated bowel loops. No evidence of inflammatory process or abnormal fluid collections. Vascular/Lymphatic: No pathologically enlarged lymph nodes. Embolization coils noted in the expected region of the gastroduodenal artery. No acute vascular findings. Reproductive:  No mass or other significant abnormality. Other:  None. Musculoskeletal:  No suspicious bone lesions identified. IMPRESSION: Distended stomach, which may be due to recent p.o. intake although gastroparesis or gastric outlet obstruction cannot be excluded. Clinical correlation is recommended. No other significant abnormality identified. Electronically Signed   By: Marlaine Hind M.D.   On: 05/12/2022 10:28     EKG:   Independently reviewed.  Orders placed or performed during the hospital encounter of 05/12/22   EKG 12-Lead   ---------------------------------------------------------------------------------------------------------------------------------------    Assessment / Plan:   Principal Problem:   Acute GI bleeding Active Problems:   Gastric outlet obstruction   PTSD (post-traumatic stress disorder)   Seizures (HCC)   Chronic pain   Neurogenic pain   Hematemesis without nausea   Abdominal pain, chronic, epigastric   Gastroesophageal reflux disease without esophagitis   Coffee ground emesis   HTN (hypertension)   GIB  (gastrointestinal bleeding)   Assessment and Plan: * Acute GI bleeding - Epigastric pain, with presumed hematemesis (Ground hematemesis per patient -with no frank bleeding) -Monitoring H&H, hemoglobin currently stable -N.p.o. -IV Protonix 40 mg twice daily -Consult gastroenterologist per ED provider Dr. Sabra Heck GI planning to proceed with EGD later today  Gastric outlet obstruction - Presentation is most consistent with possible gastric outlet obstruction -Reporting of nausea vomiting with any oral intake -GI consult, appreciate close follow-up -Pending EGD -N.p.o.   PTSD (post-traumatic stress disorder) -PTSD associated with depression anxiety and chronic pain -Home medications eviewed, will be resumed accordingly -Home medication includes: BuSpar, escitalopram, Lamictal, Suboxone - Also as needed Seroquel, Xanax..    Chronic pain - Currently on Suboxone, will be continued -As needed analgesics  Seizures (HCC) - Currently stable continue home medication of Lamictal  HTN (hypertension) - Stable continue home medication of losartan       Consults called: Gastroenterologist -------------------------------------------------------------------------------------------------------------------------------------------- DVT prophylaxis:  TED hose Start: 05/12/22 1125 SCDs Start: 05/12/22 1125 Place TED hose Start: 05/12/22 1125   Code Status:   Code Status: Full Code   Admission status: Patient will be admitted as Inpatient, with a greater than 2 midnight length of stay. Level of care: Telemetry   Family Communication:  none at bedside  (The above findings and plan of care has been discussed with patient in detail, the patient expressed understanding and agreement of above plan)  --------------------------------------------------------------------------------------------------------------------------------------------------  Disposition Plan:  Anticipated 1-2  days Status is: Inpatient Remains inpatient  appropriate because: Needing evaluation for epigastric pain, upper GI bleed, needing gastroenterology intervention possible EGD  --------------------------------------------------------------------------------------------------------------------------------------  Time spent: > than  60  Min.   SIGNED: Deatra James, MD, FHM. [facility],  Pager (Please use [email or web address] to page to text)  If 7PM-7AM, please contact night-coverage [email or web address],  05/12/2022, 1:24 PM

## 2022-05-12 NOTE — Assessment & Plan Note (Signed)
-   Presentation is most consistent with possible gastric outlet obstruction -Reporting of nausea vomiting with any oral intake -GI consult, appreciate close follow-up -Pending EGD -N.p.o.

## 2022-05-12 NOTE — Progress Notes (Signed)
Spoke with Dr. Jena Gauss, pt is to be strictly NPO, except for his scheduled Subutex which is sublingual. Pt is aware. Pt to have EGD in the morning, consent obtained and in pt chart. NPO note on pt door.

## 2022-05-12 NOTE — Assessment & Plan Note (Signed)
-   Epigastric pain, with presumed hematemesis (Ground hematemesis per patient -with no frank bleeding) -Monitoring H&H, hemoglobin currently stable -N.p.o. -IV Protonix 40 mg twice daily -Consult gastroenterologist per ED provider Dr. Sabra Heck GI planning to proceed with EGD later today

## 2022-05-12 NOTE — ED Triage Notes (Signed)
Pt c/o burning in his throat and abdomin. HX of ulcers. Pt c/o belching and vomiting dark blood. Last 24 hrs vomited 3 times.Pt only vomits when he eats.

## 2022-05-13 ENCOUNTER — Observation Stay (HOSPITAL_COMMUNITY): Payer: Medicaid Other | Admitting: Anesthesiology

## 2022-05-13 ENCOUNTER — Observation Stay (HOSPITAL_BASED_OUTPATIENT_CLINIC_OR_DEPARTMENT_OTHER): Payer: Medicaid Other | Admitting: Anesthesiology

## 2022-05-13 ENCOUNTER — Encounter: Payer: Self-pay | Admitting: Internal Medicine

## 2022-05-13 ENCOUNTER — Encounter (HOSPITAL_COMMUNITY): Payer: Self-pay | Admitting: Family Medicine

## 2022-05-13 ENCOUNTER — Encounter (HOSPITAL_COMMUNITY)
Admission: EM | Discharge status: Against Medical Advice | Payer: Self-pay | Source: Home / Self Care | Attending: Family Medicine

## 2022-05-13 DIAGNOSIS — Z886 Allergy status to analgesic agent status: Secondary | ICD-10-CM | POA: Diagnosis not present

## 2022-05-13 DIAGNOSIS — K311 Adult hypertrophic pyloric stenosis: Secondary | ICD-10-CM | POA: Diagnosis present

## 2022-05-13 DIAGNOSIS — F4310 Post-traumatic stress disorder, unspecified: Secondary | ICD-10-CM | POA: Diagnosis present

## 2022-05-13 DIAGNOSIS — F418 Other specified anxiety disorders: Secondary | ICD-10-CM | POA: Diagnosis present

## 2022-05-13 DIAGNOSIS — E876 Hypokalemia: Secondary | ICD-10-CM | POA: Diagnosis not present

## 2022-05-13 DIAGNOSIS — K315 Obstruction of duodenum: Secondary | ICD-10-CM

## 2022-05-13 DIAGNOSIS — Z8711 Personal history of peptic ulcer disease: Secondary | ICD-10-CM | POA: Diagnosis not present

## 2022-05-13 DIAGNOSIS — K922 Gastrointestinal hemorrhage, unspecified: Secondary | ICD-10-CM | POA: Diagnosis present

## 2022-05-13 DIAGNOSIS — Z87891 Personal history of nicotine dependence: Secondary | ICD-10-CM | POA: Diagnosis not present

## 2022-05-13 DIAGNOSIS — K269 Duodenal ulcer, unspecified as acute or chronic, without hemorrhage or perforation: Secondary | ICD-10-CM | POA: Diagnosis not present

## 2022-05-13 DIAGNOSIS — D649 Anemia, unspecified: Secondary | ICD-10-CM | POA: Diagnosis present

## 2022-05-13 DIAGNOSIS — I471 Supraventricular tachycardia: Secondary | ICD-10-CM | POA: Diagnosis not present

## 2022-05-13 DIAGNOSIS — G8929 Other chronic pain: Secondary | ICD-10-CM | POA: Diagnosis present

## 2022-05-13 DIAGNOSIS — Z87442 Personal history of urinary calculi: Secondary | ICD-10-CM | POA: Diagnosis not present

## 2022-05-13 DIAGNOSIS — Z888 Allergy status to other drugs, medicaments and biological substances status: Secondary | ICD-10-CM | POA: Diagnosis not present

## 2022-05-13 DIAGNOSIS — R569 Unspecified convulsions: Secondary | ICD-10-CM | POA: Diagnosis present

## 2022-05-13 DIAGNOSIS — I10 Essential (primary) hypertension: Secondary | ICD-10-CM | POA: Diagnosis present

## 2022-05-13 DIAGNOSIS — F1120 Opioid dependence, uncomplicated: Secondary | ICD-10-CM | POA: Diagnosis not present

## 2022-05-13 DIAGNOSIS — Z79899 Other long term (current) drug therapy: Secondary | ICD-10-CM | POA: Diagnosis not present

## 2022-05-13 DIAGNOSIS — K219 Gastro-esophageal reflux disease without esophagitis: Secondary | ICD-10-CM | POA: Diagnosis present

## 2022-05-13 DIAGNOSIS — K264 Chronic or unspecified duodenal ulcer with hemorrhage: Secondary | ICD-10-CM | POA: Diagnosis present

## 2022-05-13 HISTORY — PX: ESOPHAGOGASTRODUODENOSCOPY (EGD) WITH PROPOFOL: SHX5813

## 2022-05-13 LAB — CBC
HCT: 29.7 % — ABNORMAL LOW (ref 39.0–52.0)
Hemoglobin: 8.6 g/dL — ABNORMAL LOW (ref 13.0–17.0)
MCH: 22.1 pg — ABNORMAL LOW (ref 26.0–34.0)
MCHC: 29 g/dL — ABNORMAL LOW (ref 30.0–36.0)
MCV: 76.2 fL — ABNORMAL LOW (ref 80.0–100.0)
Platelets: 294 10*3/uL (ref 150–400)
RBC: 3.9 MIL/uL — ABNORMAL LOW (ref 4.22–5.81)
RDW: 17 % — ABNORMAL HIGH (ref 11.5–15.5)
WBC: 5.5 10*3/uL (ref 4.0–10.5)
nRBC: 0 % (ref 0.0–0.2)

## 2022-05-13 LAB — GLUCOSE, CAPILLARY: Glucose-Capillary: 102 mg/dL — ABNORMAL HIGH (ref 70–99)

## 2022-05-13 SURGERY — ESOPHAGOGASTRODUODENOSCOPY (EGD) WITH PROPOFOL
Anesthesia: General

## 2022-05-13 MED ORDER — LIDOCAINE HCL (CARDIAC) PF 50 MG/5ML IV SOSY
PREFILLED_SYRINGE | INTRAVENOUS | Status: DC | PRN
  Administered 2022-05-13: 50 mg via INTRAVENOUS

## 2022-05-13 MED ORDER — BUPRENORPHINE HCL 8 MG SL SUBL
8.0000 mg | SUBLINGUAL_TABLET | Freq: Three times a day (TID) | SUBLINGUAL | Status: DC
  Administered 2022-05-13 – 2022-05-14 (×4): 8 mg via SUBLINGUAL
  Filled 2022-05-13 (×4): qty 1

## 2022-05-13 MED ORDER — PROPOFOL 10 MG/ML IV BOLUS
INTRAVENOUS | Status: DC | PRN
  Administered 2022-05-13: 250 mg via INTRAVENOUS

## 2022-05-13 MED ORDER — SODIUM CHLORIDE 0.9 % IV SOLN: INTRAVENOUS | Status: DC

## 2022-05-13 MED ORDER — LACTATED RINGERS IV SOLN
INTRAVENOUS | Status: DC
  Administered 2022-05-13: 1000 mL via INTRAVENOUS

## 2022-05-13 MED ORDER — SODIUM CHLORIDE 0.9 % IV SOLN
INTRAVENOUS | Status: DC
Start: 2022-05-13 — End: 2022-05-13

## 2022-05-13 NOTE — Op Note (Signed)
[facility] Patient Name: Alan Jennings Procedure Date: 05/13/2022 11:07 AM MRN: [id number] Date of Birth: [birth year] Attending MD: Maylon Peppers ,  CSN: [id number] Age: 41 Admit Type: Inpatient Procedure:                Upper GI endoscopy Indications:               Providers:                Maylon Peppers, Charlsie Quest. Theda Sers RN, RN, Casimer Bilis, Technician Referring MD:              Medicines:                Monitored Anesthesia Care Complications:            No immediate complications. Estimated Blood Loss:     Estimated blood loss: none. Procedure:                Pre-Anesthesia Assessment:                           - Prior to the procedure, a History and Physical                            was performed, and patient medications, allergies                            and sensitivities were reviewed. The patient's                            tolerance of previous anesthesia was reviewed.                           - The risks and benefits of the procedure and the                            sedation options and risks were discussed with the                            patient. All questions were answered and informed                            consent was obtained.                           - ASA Grade Assessment: II - A patient with mild                            systemic disease.                           After obtaining informed consent, the endoscope was                            passed under direct vision. Throughout the  procedure, the patient's blood pressure, pulse, and                            oxygen saturations were monitored continuously. The                            [id number]) scope was introduced through the                            mouth, and advanced to the duodenal bulb. The upper                            GI endoscopy was performed with difficulty due to                             presence of food. The patient tolerated the                            procedure well. Scope In: 11:23:50 AM Scope Out: 11:27:00 AM Total Procedure Duration: 0 hours 3 minutes 10 seconds  Findings:      The examined esophagus was normal.      A large amount of food and fluid (residue) was found in the gastric       fundus and in the gastric body.      Food (residue) was found in the duodenal bulb. A rim of erythema was       seen in the pylorus.      An acquired benign-appearing, intrinsic moderate stenosis was found in       the first portion of the duodenum and could not be easily traversed with       the upper scope. Given presence of large amount of food in the stomach       and proximal duodenum, no attempt to downsize the scope or dilate the       stricture was performed.      One non-bleeding cratered duodenal ulcer with a clean ulcer base       (Forrest Class III) was found in the first portion of the duodenum,       adjacent to the duodenal stricture. The lesion was 10 mm in largest       dimension, but accurate measurements and inspection could not be       performed due to the presence of food. Impression:               - Normal esophagus.                           - A large amount of food (residue) in the stomach.                           - Retained food in the duodenum.                           - Acquired duodenal stenosis.                           - associated non-bleeding duodenal  ulcer with a                            clean ulcer base (Forrest Class III).                           - No specimens collected. Moderate Sedation:      Per Anesthesia Care Recommendation:           - Return patient to hospital ward for ongoing care.                           - Full liquid diet.                           - Continue pantoprazole 40 mg every 12 hours.                           - Repeat upper endoscopy in 2-3 days to properly                            assess disease  activity, once solid food has                            evacuated the stomach and duodenal bulb.                           - Check H. pylori serology. Procedure Code(s):        --- Professional ---                           [phone], Esophagogastroduodenoscopy, flexible,                            transoral; diagnostic, including collection of                            specimen(s) by brushing or washing, when performed                            (separate procedure) Diagnosis Code(s):        --- Professional ---                           K31.5, Obstruction of duodenum                           K26.9, Duodenal ulcer, unspecified as acute or                            chronic, without hemorrhage or perforation CPT copyright 2019 American Medical Association. All rights reserved. The codes documented in this report are preliminary and upon coder review may  be revised to meet current compliance requirements. Maylon Peppers, MD Maylon Peppers,  05/13/2022 11:42:06 AM This report has been signed electronically. Number of Addenda: 0

## 2022-05-13 NOTE — Assessment & Plan Note (Signed)
-  continue home medication of Lamictal

## 2022-05-13 NOTE — Transfer of Care (Signed)
Immediate Anesthesia Transfer of Care Note  Patient: Alan Jennings  Procedure(s) Performed: ESOPHAGOGASTRODUODENOSCOPY (EGD) WITH PROPOFOL  Patient Location: PACU  Anesthesia Type:General  Level of Consciousness: awake and patient cooperative  Airway & Oxygen Therapy: Patient Spontanous Breathing  Post-op Assessment: Report given to RN and Post -op Vital signs reviewed and stable  Post vital signs: Reviewed and stable  Last Vitals:  Vitals Value Taken Time  BP 93/63 1134  Temp 98.2 1134  Pulse 79 05/13/22 1134  Resp 16 05/13/22 1134  SpO2 98 % 05/13/22 1134  Vitals shown include unvalidated device data.  Last Pain:  Vitals:   05/13/22 1119  TempSrc:   PainSc: 2       Patients Stated Pain Goal: 6 (05/13/22 1012)  Complications: No notable events documented.

## 2022-05-13 NOTE — Anesthesia Postprocedure Evaluation (Signed)
Anesthesia Post Note  Patient: Alan Jennings  Procedure(s) Performed: ESOPHAGOGASTRODUODENOSCOPY (EGD) WITH PROPOFOL  Patient location during evaluation: Phase II Anesthesia Type: General Level of consciousness: awake Pain management: pain level controlled Vital Signs Assessment: post-procedure vital signs reviewed and stable Respiratory status: spontaneous breathing and respiratory function stable Cardiovascular status: blood pressure returned to baseline and stable Postop Assessment: no headache and no apparent nausea or vomiting Anesthetic complications: no Comments: Late entry   No notable events documented.   Last Vitals:  Vitals:   05/13/22 1145 05/13/22 1215  BP: 116/76 128/86  Pulse: 88 65  Resp: 14 16  Temp: 36.8 C 36.7 C  SpO2: 99% 100%    Last Pain:  Vitals:   05/13/22 1215  TempSrc: Oral  PainSc:                  Windell Norfolk

## 2022-05-13 NOTE — Progress Notes (Signed)
We will proceed with EGD as scheduled.  I thoroughly discussed with the patient his procedure, including the risks involved. Patient understands what the procedure involves including the benefits and any risks. Patient understands alternatives to the proposed procedure. Risks including (but not limited to) bleeding, tearing of the lining (perforation), rupture of adjacent organs, problems with heart and lung function, infection, and medication reactions. A small percentage of complications may require surgery, hospitalization, repeat endoscopic procedure, and/or transfusion.  Patient understood and agreed.  Anedra Penafiel Castaneda, MD Gastroenterology and Hepatology [facility]  

## 2022-05-13 NOTE — Anesthesia Preprocedure Evaluation (Signed)
Anesthesia Evaluation  Patient identified by MRN, date of birth, ID band Patient awake    Reviewed: Allergy & Precautions, H&P , NPO status , Patient's Chart, lab work & pertinent test results, reviewed documented beta blocker date and time   Airway Mallampati: II  TM Distance: >3 FB Neck ROM: full    Dental no notable dental hx.    Pulmonary neg pulmonary ROS, former smoker,    Pulmonary exam normal breath sounds clear to auscultation       Cardiovascular Exercise Tolerance: Good hypertension, negative cardio ROS   Rhythm:regular Rate:Normal     Neuro/Psych  Headaches, Seizures -, Well Controlled,  PSYCHIATRIC DISORDERS Anxiety Depression    GI/Hepatic Neg liver ROS, PUD, GERD  Medicated,  Endo/Other  negative endocrine ROS  Renal/GU negative Renal ROS  negative genitourinary   Musculoskeletal   Abdominal   Peds  Hematology negative hematology ROS (+)   Anesthesia Other Findings   Reproductive/Obstetrics negative OB ROS                             Anesthesia Physical Anesthesia Plan  ASA: 2  Anesthesia Plan: General   Post-op Pain Management:    Induction:   PONV Risk Score and Plan: Propofol infusion  Airway Management Planned:   Additional Equipment:   Intra-op Plan:   Post-operative Plan:   Informed Consent: I have reviewed the patients History and Physical, chart, labs and discussed the procedure including the risks, benefits and alternatives for the proposed anesthesia with the patient or authorized representative who has indicated his/her understanding and acceptance.     Dental Advisory Given  Plan Discussed with: CRNA  Anesthesia Plan Comments:         Anesthesia Quick Evaluation

## 2022-05-13 NOTE — Brief Op Note (Signed)
05/12/2022 - 05/13/2022  11:37 AM  PATIENT:  Alan Jennings  41 y.o. male  PRE-OPERATIVE DIAGNOSIS:  nausea and vomiting; pssible gastric outlet obstruction  POST-OPERATIVE DIAGNOSIS:  duodenal ulcer, duodenal stenosis, food in stomach  PROCEDURE:  Procedure(s): ESOPHAGOGASTRODUODENOSCOPY (EGD) WITH PROPOFOL (N/A)  SURGEON:  Surgeon(s) and Role:    * Harvel Quale, MD - Primary  ;Patient underwent EGD under propofol sedation.  Tolerated the procedure adequately.  Esophagus was normal. A large amount of food and fluid (residue) was found in the gastric fundus and in the gastric body.  Food (residue) was found in the duodenal bulb. A rim of erythema was seen in the pylorus. An acquired benign-appearing, intrinsic moderate stenosis was found in the first portion of the duodenum and could not be easily traversed with the upper scope. Given presence of large amount of food in the stomach and proximal duodenum, no attempt to downsize the scope or dilate the stricture was performed. One non-bleeding cratered duodenal ulcer with a clean ulcer base (Forrest Class III) was found in the first portion of the duodenum, adjacent to the duodenal stricture.  The lesion was 10 mm in largest dimension, but accurate measurements and inspection could not be performed due to the presence of food.  RECOMMENDATIONS - Return patient to hospital ward for ongoing care.  - Full liquid diet.  - Continue pantoprazole 40 mg every 12 hours. - Repeat upper endoscopy in 2-3 days to properly assess disease activity, once solid food has evacuated the stomach and duodenal bulb. - Check H. pylori serology.  Maylon Peppers, MD Gastroenterology and Hepatology [facility]

## 2022-05-13 NOTE — Assessment & Plan Note (Signed)
-   Epigastric pain, with presumed hematemesis (Ground hematemesis per patient -with no frank bleeding) -Monitoring H&H, hemoglobin currently stable -Hemoglobin: 9.9, 8.5, 8.6 -N.p.o. overnight -IV Protonix 40 mg twice daily -Consulted gastroenterologist  -EGD today 05/13/2022:

## 2022-05-13 NOTE — Assessment & Plan Note (Signed)
D/c losartan since metoprolol started to allow BP margin for titration

## 2022-05-13 NOTE — Anesthesia Procedure Notes (Signed)
Date/Time: 05/13/2022 11:17 AM Performed by: Franco Nones, CRNA Pre-anesthesia Checklist: Patient identified, Emergency Drugs available, Suction available, Timeout performed and Patient being monitored Patient Re-evaluated:Patient Re-evaluated prior to induction Oxygen Delivery Method: Nasal Cannula

## 2022-05-13 NOTE — Assessment & Plan Note (Signed)
-   Currently on Suboxone, will be continued -As needed analgesics

## 2022-05-13 NOTE — Progress Notes (Signed)
PROGRESS NOTE    Patient: Alan Jennings                            PCP: Dionisio Paschal, NP                    DOB: May 02, 1981            DOA: 05/12/2022 [id number]             DOS: 05/13/2022, 11:13 AM   LOS: 1 day   Date of Service: The patient was seen and examined on 05/13/2022  Subjective:   The patient was seen and examined this morning. Stable at this time. Stating abdominal pain has been managed appropriately with IV pain managed Since his stomach has been empty he has not had any nausea vomiting Been n.p.o. overnight Otherwise no issues overnight .  Brief Narrative:   KHALIK HAYLEY is a 41 year old male with extensive history of previous GI bleed, depression, anxiety, polypharmacy including Abilify, Suboxone, bupropion, citalopram, lamotrigine Seroquel presenting with anxiety stating that he has spitting coffee-ground material, minimum vomiting no frank bleeding with vomitus., not feeling well for the past 24 hours.  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  Stating that he has been off Protonix since past month when he started noticing some burning sensation in his abdomen.   Denies any alcohol tobacco or drug use.  Denies of using NSAIDs.  ED: Blood pressure 130/77, pulse (!) 109, temperature 98.9 F (37.2 C), temperature source Oral, resp. rate 12, height 5\' 8"  (1.727 m), weight 68 kg, SpO2 100 %.    Latest Ref Rng & Units 05/12/2022    8:57 AM 05/16/2019   10:18 AM 05/16/2019    6:11 AM  CBC  WBC 4.0 - 10.5 K/uL 10.5    5.4    Hemoglobin 13.0 - 17.0 g/dL 9.9   9.1   9.3    Hematocrit 39.0 - 52.0 % 34.6   31.0   31.6    Platelets 150 - 400 K/uL 388    309        Latest Ref Rng & Units 05/12/2022    8:57 AM 05/16/2019     6:11 AM 01/23/2019   11:46 PM  BMP  Glucose 70 - 99 mg/dL 116   99   135    BUN 6 - 20 mg/dL 15   12   8     Creatinine 0.61 - 1.24 mg/dL 0.84   0.79   0.85    Sodium 135 - 145 mmol/L 135   138   139    Potassium 3.5 - 5.1 mmol/L 3.7   4.0   2.6    Chloride 98 - 111 mmol/L 105   102   108    CO2 22 - 32 mmol/L 23   21   21     Calcium 8.9 - 10.3 mg/dL 9.3   9.3   8.7     ED provider has discussed the case with gastroenterologist, who agreed to pursue with EGD later today..  Admitted for possible upper GI bleed.      Assessment & Plan:   Principal Problem:   Acute GI bleeding  Active Problems:   Gastric outlet obstruction   PTSD (post-traumatic stress disorder)   Seizures (HCC)   Chronic pain   Neurogenic pain   Hematemesis without nausea   Abdominal pain, chronic, epigastric   Gastroesophageal reflux disease without esophagitis   Coffee ground emesis   HTN (hypertension)   GIB (gastrointestinal bleeding)     Assessment and Plan: * Acute GI bleeding - Epigastric pain, with presumed hematemesis (Ground hematemesis per patient -with no frank bleeding) -Monitoring H&H, hemoglobin currently stable -Hemoglobin: 9.9, 8.5, 8.6 -N.p.o. overnight -IV Protonix 40 mg twice daily -Consulted gastroenterologist  -EGD today 05/13/2022:    Gastric outlet obstruction - Presentation is most consistent with possible gastric outlet obstruction -Reporting of nausea vomiting with any oral intake -GI consult, appreciate close follow-up -Pending EGD -N.p.o.   PTSD (post-traumatic stress disorder) -PTSD associated with depression anxiety and chronic pain -Home medications eviewed, will be resumed accordingly -Home medication includes: BuSpar, escitalopram, Lamictal, Suboxone - Also as needed Seroquel, Xanax..    Chronic pain - Currently on Suboxone, will be continued -As needed analgesics  Seizures (HCC) - Currently stable continue home medication of Lamictal  HTN  (hypertension) - Stable continue home medication of losartan  -----------------------------------------------------------------------------------------------------------------------  DVT prophylaxis:  TED hose Start: 05/12/22 1125 SCDs Start: 05/12/22 1125 Place TED hose Start: 05/12/22 1125   Code Status:   Code Status: Full Code  Family Communication: Mother present bedside, discussed with patient and his mother The above findings and plan of care has been discussed with patient (and family)  in detail,  they expressed understanding and agreement of above. -Advance care planning has been discussed.   Admission status:   Status is: Observation The patient remains OBS appropriate and will d/c before 2 midnights.     Procedures:   No admission procedures for hospital encounter.   Antimicrobials:  Anti-infectives (From admission, onward)    None        Medication:   [MAR Hold] buprenorphine  8 mg Sublingual Q8H   [MAR Hold] busPIRone  30 mg Oral BID   [MAR Hold] escitalopram  10 mg Oral QHS   [MAR Hold] lamoTRIgine  200 mg Oral Daily   [MAR Hold] losartan  25 mg Oral Daily   [MAR Hold] pantoprazole (PROTONIX) IV  40 mg Intravenous Q12H   [MAR Hold] sodium chloride flush  3 mL Intravenous Q12H   [MAR Hold] sodium chloride flush  3 mL Intravenous Q12H   [MAR Hold] sucralfate  1 g Oral TID WC & HS    [MAR Hold] sodium chloride, [MAR Hold] acetaminophen **OR** [MAR Hold] acetaminophen, [MAR Hold] albuterol, [MAR Hold] bisacodyl, [MAR Hold] hydrALAZINE, [MAR Hold]  HYDROmorphone (DILAUDID) injection, [MAR Hold] ipratropium, [MAR Hold] LORazepam, [MAR Hold] ondansetron **OR** [MAR Hold] ondansetron (ZOFRAN) IV, [MAR Hold] oxyCODONE, [MAR Hold] QUEtiapine, [MAR Hold] senna-docusate, [MAR Hold] sodium chloride flush, [MAR Hold] zolpidem   Objective:   Vitals:   05/13/22 0145 05/13/22 0447 05/13/22 0447 05/13/22 1012  BP: 116/65  104/71 (!) 145/89  Pulse: (!) 55  63 79   Resp: 18  18 14   Temp: 98.8 F (37.1 C)  98.2 F (36.8 C) 98.6 F (37 C)  TempSrc: Oral   Oral  SpO2: 97%  99% 100%  Weight:  60.8 kg  60.8 kg  Height:  5\' 8"  (1.727 m)  5\' 8"  (1.727 m)    Intake/Output Summary (Last 24 hours) at 05/13/2022 1113 Last data filed at 05/13/2022 0500 Gross per 24 hour  Intake 1213.56 ml  Output --  Net 1213.56 ml   Filed Weights   05/12/22 1250 05/13/22 0447 05/13/22 1012  Weight: 61.4 kg 60.8 kg 60.8 kg     Examination:   Physical Exam  Constitution:  Alert, cooperative, no distress,  Appears calm and comfortable  Psychiatric:   Normal and stable mood and affect, cognition intact,   HEENT:        Normocephalic, PERRL, otherwise with in Normal limits  Chest:         Chest symmetric Cardio vascular:  S1/S2, RRR, No murmure, No Rubs or Gallops  pulmonary: Clear to auscultation bilaterally, respirations unlabored, negative wheezes / crackles Abdomen: Soft, non-tender, non-distended, bowel sounds,no masses, no organomegaly Muscular skeletal: Limited exam - in bed, able to move all 4 extremities,   Neuro: CNII-XII intact. , normal motor and sensation, reflexes intact  Extremities: No pitting edema lower extremities, +2 pulses  Skin: Dry, warm to touch, negative for any Rashes, No open wounds Wounds: per nursing documentation   ------------------------------------------------------------------------------------------------------------------------------------------    LABs:     Latest Ref Rng & Units 05/13/2022    9:04 AM 05/12/2022   11:35 AM 05/12/2022    8:57 AM  CBC  WBC 4.0 - 10.5 K/uL 5.5   9.8   10.5    Hemoglobin 13.0 - 17.0 g/dL 8.6   8.5   9.9    Hematocrit 39.0 - 52.0 % 29.7   29.7   34.6    Platelets 150 - 400 K/uL 294   328   388        Latest Ref Rng & Units 05/12/2022   11:35 AM 05/12/2022    8:57 AM 05/16/2019    6:11 AM  CMP  Glucose 70 - 99 mg/dL  116   99    BUN 6 - 20 mg/dL  15   12    Creatinine 0.61 - 1.24 mg/dL  0.75   0.84   0.79    Sodium 135 - 145 mmol/L  135   138    Potassium 3.5 - 5.1 mmol/L  3.7   4.0    Chloride 98 - 111 mmol/L  105   102    CO2 22 - 32 mmol/L  23   21    Calcium 8.9 - 10.3 mg/dL  9.3   9.3    Total Protein 6.5 - 8.1 g/dL  8.2   8.0    Total Bilirubin 0.3 - 1.2 mg/dL  0.2   0.1    Alkaline Phos 38 - 126 U/L  58   64    AST 15 - 41 U/L  21   25    ALT 0 - 44 U/L  16   14         Micro Results No results found for this or any previous visit (from the past 240 hour(s)).  Radiology Reports No results found.  SIGNED: Deatra James, MD, FHM. [facility],  Pager (please use [email or web address] to page/text) Please use Epic Secure Chat for non-urgent communication (7AM-7PM)  If 7PM-7AM, please contact night-coverage [email or web address], 05/13/2022, 11:13 AM

## 2022-05-14 ENCOUNTER — Other Ambulatory Visit (HOSPITAL_COMMUNITY): Payer: Medicaid Other

## 2022-05-14 ENCOUNTER — Encounter (HOSPITAL_COMMUNITY): Payer: Self-pay | Admitting: Gastroenterology

## 2022-05-14 DIAGNOSIS — F1120 Opioid dependence, uncomplicated: Secondary | ICD-10-CM

## 2022-05-14 DIAGNOSIS — R569 Unspecified convulsions: Secondary | ICD-10-CM

## 2022-05-14 DIAGNOSIS — K311 Adult hypertrophic pyloric stenosis: Secondary | ICD-10-CM | POA: Diagnosis not present

## 2022-05-14 DIAGNOSIS — F4310 Post-traumatic stress disorder, unspecified: Secondary | ICD-10-CM

## 2022-05-14 DIAGNOSIS — K922 Gastrointestinal hemorrhage, unspecified: Secondary | ICD-10-CM | POA: Diagnosis not present

## 2022-05-14 DIAGNOSIS — I471 Supraventricular tachycardia, unspecified: Secondary | ICD-10-CM

## 2022-05-14 DIAGNOSIS — E876 Hypokalemia: Secondary | ICD-10-CM

## 2022-05-14 LAB — BASIC METABOLIC PANEL
Anion gap: 7 (ref 5–15)
BUN: 5 mg/dL — ABNORMAL LOW (ref 6–20)
CO2: 26 mmol/L (ref 22–32)
Calcium: 9 mg/dL (ref 8.9–10.3)
Chloride: 107 mmol/L (ref 98–111)
Creatinine, Ser: 0.77 mg/dL (ref 0.61–1.24)
GFR, Estimated: >60 mL/min (ref >60)
Glucose, Bld: 115 mg/dL — ABNORMAL HIGH (ref 70–99)
Potassium: 3.3 mmol/L — ABNORMAL LOW (ref 3.5–5.1)
Sodium: 140 mmol/L (ref 135–145)

## 2022-05-14 LAB — FERRITIN: Ferritin: 4 ng/mL — ABNORMAL LOW (ref 24–336)

## 2022-05-14 LAB — CBC
HCT: 30.9 % — ABNORMAL LOW (ref 39.0–52.0)
Hemoglobin: 9 g/dL — ABNORMAL LOW (ref 13.0–17.0)
MCH: 22.1 pg — ABNORMAL LOW (ref 26.0–34.0)
MCHC: 29.1 g/dL — ABNORMAL LOW (ref 30.0–36.0)
MCV: 75.7 fL — ABNORMAL LOW (ref 80.0–100.0)
Platelets: 297 10*3/uL (ref 150–400)
RBC: 4.08 MIL/uL — ABNORMAL LOW (ref 4.22–5.81)
RDW: 16.9 % — ABNORMAL HIGH (ref 11.5–15.5)
WBC: 5.7 10*3/uL (ref 4.0–10.5)
nRBC: 0 % (ref 0.0–0.2)

## 2022-05-14 LAB — H. PYLORI ANTIBODY, IGG: H Pylori IgG: 0.13 Index Value (ref 0.00–0.79)

## 2022-05-14 LAB — GLUCOSE, CAPILLARY: Glucose-Capillary: 124 mg/dL — ABNORMAL HIGH (ref 70–99)

## 2022-05-14 LAB — IRON AND TIBC
Iron: 18 ug/dL — ABNORMAL LOW (ref 45–182)
Saturation Ratios: 4 % — ABNORMAL LOW (ref 17.9–39.5)
TIBC: 509 ug/dL — ABNORMAL HIGH (ref 250–450)
UIBC: 491 ug/dL

## 2022-05-14 LAB — MAGNESIUM: Magnesium: 2.3 mg/dL (ref 1.7–2.4)

## 2022-05-14 LAB — TSH: TSH: 1.045 u[IU]/mL (ref 0.350–4.500)

## 2022-05-14 MED ORDER — METOPROLOL TARTRATE 25 MG PO TABS: 12.5000 mg | ORAL_TABLET | Freq: Two times a day (BID) | ORAL | Status: DC

## 2022-05-14 MED ORDER — HYDRALAZINE HCL 20 MG/ML IJ SOLN: 10.0000 mg | INTRAMUSCULAR | Status: DC | PRN

## 2022-05-14 NOTE — Assessment & Plan Note (Signed)
-   Epigastric pain, with presumed hematemesis (Ground hematemesis per patient -with no frank bleeding) -Hemoglobin: overall stable -IV Protonix 40 mg twice daily -appreciate GI -EGD t5/30/2023--large amt retained food in gastric fundus; one nonbleeding cratered duodenal ulcer with clean base adj to duodenal stricture -planning repeat EGD 1-2 days

## 2022-05-14 NOTE — Discharge Summary (Signed)
Physician Discharge Summary   Patient: Alan Jennings MRN: [id number] DOB: 02-14-1981  Admit date:     05/12/2022  Discharge date: 05/14/22  Discharge Physician: Shanon Brow Lunden Mcleish   PCP: Dionisio Paschal, [place] Hospital Course: Alan Jennings is a 41 year old male with extensive history of previous GI bleed, depression, anxiety, polypharmacy including Abilify, Suboxone, bupropion, citalopram, lamotrigine Seroquel presenting with anxiety stating that he has spitting coffee-ground material, minimum vomiting no frank bleeding with vomitus., not feeling well for the past 24 hours prior to admission.  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  Stating that he has been off Protonix since past month when he started noticing some burning sensation in his abdomen.   Denies any alcohol tobacco or drug use.  Denies of using NSAIDs.  ED: Blood pressure 130/77, pulse (!) 109, temperature 98.9 F (37.2 C), temperature source Oral, resp. rate 12, height 5\' 8"  (1.727 m), weight 68 kg, SpO2 100 %.    Latest Ref Rng & Units 05/12/2022    8:57 AM 05/16/2019   10:18 AM 05/16/2019    6:11 AM  CBC  WBC 4.0 - 10.5 K/uL 10.5    5.4    Hemoglobin 13.0 - 17.0 g/dL 9.9   9.1   9.3    Hematocrit 39.0 - 52.0 % 34.6   31.0   31.6    Platelets 150 - 400 K/uL 388    309        Latest Ref Rng & Units 05/12/2022    8:57 AM 05/16/2019    6:11 AM 01/23/2019   11:46 PM  BMP  Glucose 70 - 99 mg/dL 116   99   135    BUN 6 - 20 mg/dL 15   12   8     Creatinine 0.61 - 1.24 mg/dL 0.84   0.79   0.85    Sodium 135 - 145 mmol/L 135   138   139    Potassium 3.5 - 5.1 mmol/L 3.7   4.0   2.6    Chloride 98 - 111 mmol/L 105   102   108    CO2 22 - 32 mmol/L 23   21   21      Calcium 8.9 - 10.3 mg/dL 9.3   9.3   8.7     ED provider has discussed the case with gastroenterologist, who agreed to pursue with EGD this admssion.  Admitted for possible upper GI bleed.  In the morning of 5/31, patient was feeling better.  Patient denies fevers, chills, headache, chest pain, dyspnea, nausea, vomiting, diarrhea, abdominal pain, dysuria, hematuria, hematochezia, and melena. He had an episode of SVT.   Nurse went to room and Pt was not in there. Pt had pulled both Ivs out and pt had removed all of is belongings.  He left hospital without notifying any staff.  Assessment and Plan: * Upper GI bleed - Epigastric pain, with presumed hematemesis (Ground hematemesis per patient -with no frank bleeding) -Hemoglobin: overall stable -IV Protonix 40 mg twice daily -appreciate GI -EGD t5/30/2023--large amt retained food in gastric fundus; one nonbleeding cratered duodenal ulcer with clean base adj to duodenal stricture -planning repeat EGD 1-2 days  PTSD (post-traumatic stress  disorder) -PTSD associated with depression anxiety and chronic pain -Home medications eviewed, will be resumed accordingly -Home medication includes: BuSpar, escitalopram, Lamictal, Suboxone - Also as needed Seroquel, Xanax..    Seizures ([place]) -continue home medication of Lamictal  Gastric outlet obstruction - Presentation is most consistent with possible gastric outlet obstruction -Reporting of nausea vomiting with any oral intake -GI consult appreciated -5/30 EGD--duodenal stricture with retained food in gastric fundus -continue full liquids per GI  Supraventricular tachycardia ([place]) -Personally reviewed EKG--sinus, nonspecific STT changes -Personally reviewed tele>>SVT/atrial tachycardia--lasted ~15 min Start metoprolol 12.5 mg bid Echo TSH  Opioid dependence ([place]) - Currently on Suboxone, will be continued -As needed analgesics PDMP reviewed--receives monthly buprenorphine #90, last  04/15/22  HTN (hypertension) D/c losartan since metoprolol started to allow BP margin for titration  Gastroesophageal reflux disease without esophagitis Continue pantoprazole  Hypokalemia Replete Mag 2.3         Consultants: GI Procedures performed: none  Disposition: AGAINST MEDICAL ADVICE  DISCHARGE MEDICATION: Allergies as of 05/14/2022       Reactions   Tramadol Other (See Comments)   SEIZURES   Nsaids    Has caused ulcers and needs to avoid.        Medication List     STOP taking these medications    ARIPiprazole 5 MG tablet Commonly known as: Abilify   HYDROcodone-acetaminophen 5-325 MG tablet Commonly known as: NORCO/VICODIN   losartan 25 MG tablet Commonly known as: COZAAR       TAKE these medications    acetaminophen 500 MG tablet Commonly known as: TYLENOL Take 1,500 mg by mouth every 6 (six) hours as needed for moderate pain.   buprenorphine 8 MG Subl SL tablet Commonly known as: SUBUTEX Dissolve 1 tablet under the tongue 3 times a day   busPIRone 30 MG tablet Commonly known as: BUSPAR Take 1 tablet by mouth 2 times a day with food   escitalopram 10 MG tablet Commonly known as: Lexapro Take 1 tablet by mouth once daily   lamoTRIgine 200 MG tablet Commonly known as: LAMICTAL Take 1 tablet by mouth once daily   LORazepam 0.5 MG tablet Commonly known as: ATIVAN Take 1 tablet (0.5 mg total) by mouth daily as needed for severe anxiety (must last 30 days per md)   pantoprazole 40 MG tablet Commonly known as: Protonix Take 1 tablet by mouth 2 times a day   QUEtiapine 50 MG tablet Commonly known as: SEROquel Take 1 tablet by mouth at bedtime as needed  for insomnia What changed: Another medication with the same name was removed. Continue taking this medication, and follow the directions you see here.        Discharge Exam: Filed Weights   05/13/22 0447 05/13/22 1012 05/14/22 0500  Weight: 60.8 kg 60.8 kg 60.5 kg   HEENT:   [place]/AT, No thrush, no icterus CV:  RRR, no rub, no S3, no S4 Lung:  CTA, no wheeze, no rhonchi Abd:  soft/+BS, NT Ext:  No edema, no lymphangitis, no synovitis, no rash   Condition at discharge: [place]  The results of significant diagnostics from this hospitalization (including imaging, microbiology, ancillary and laboratory) are listed below for reference.   Imaging Studies: CT ABDOMEN PELVIS W CONTRAST  Result Date: 05/12/2022 CLINICAL DATA:  Acute abdominal pain. Hematemesis. Previous gastric ulcers and gastric artery embolization. EXAM: CT ABDOMEN AND PELVIS WITH CONTRAST TECHNIQUE: Multidetector CT imaging of the abdomen and pelvis was performed using the standard protocol following  bolus administration of intravenous contrast. RADIATION DOSE REDUCTION: This exam was performed according to the departmental dose-optimization program which includes automated exposure control, adjustment of the mA and/or kV according to patient size and/or use of iterative reconstruction technique. CONTRAST:  183mL OMNIPAQUE IOHEXOL 300 MG/ML  SOLN COMPARISON:  05/16/2019 FINDINGS: Lower Chest: No acute findings. Hepatobiliary: No hepatic masses identified. Gallbladder is unremarkable. No evidence of biliary ductal dilatation. Pancreas:  No mass or inflammatory changes. Spleen: Within normal limits in size and appearance. Adrenals/Urinary Tract: No masses identified. No evidence of ureteral calculi or hydronephrosis. Stomach/Bowel: Stomach is distended, however there is no evidence of dilated bowel loops. No evidence of inflammatory process or abnormal fluid collections. Vascular/Lymphatic: No pathologically enlarged lymph nodes. Embolization coils noted in the expected region of the gastroduodenal artery. No acute vascular findings. Reproductive:  No mass or other significant abnormality. Other:  None. Musculoskeletal:  No suspicious bone lesions identified. IMPRESSION: Distended stomach, which may be  due to recent p.o. intake although gastroparesis or gastric outlet obstruction cannot be excluded. Clinical correlation is recommended. No other significant abnormality identified. Electronically Signed   By: Marlaine Hind M.D.   On: 05/12/2022 10:28    Microbiology: Results for orders placed or performed during the hospital encounter of 05/16/19  SARS Coronavirus 2 (CEPHEID- Performed in [place] hospital lab), Hosp Order     Status: None   Collection Time: 05/16/19  6:48 AM   Specimen: Nasopharyngeal Swab  Result Value Ref Range Status   SARS Coronavirus 2 NEGATIVE NEGATIVE Final    Comment: (NOTE) If result is NEGATIVE SARS-CoV-2 target nucleic acids are NOT DETECTED. The SARS-CoV-2 RNA is generally detectable in upper and lower  respiratory specimens during the acute phase of infection. The lowest  concentration of SARS-CoV-2 viral copies this assay can detect is 250  copies / mL. A negative result does not preclude SARS-CoV-2 infection  and should not be used as the sole basis for treatment or other  patient management decisions.  A negative result may occur with  improper specimen collection / handling, submission of specimen other  than nasopharyngeal swab, presence of viral mutation(s) within the  areas targeted by this assay, and inadequate number of viral copies  (<250 copies / mL). A negative result must be combined with clinical  observations, patient history, and epidemiological information. If result is POSITIVE SARS-CoV-2 target nucleic acids are DETECTED. The SARS-CoV-2 RNA is generally detectable in upper and lower  respiratory specimens dur ing the acute phase of infection.  Positive  results are indicative of active infection with SARS-CoV-2.  Clinical  correlation with patient history and other diagnostic information is  necessary to determine patient infection status.  Positive results do  not rule out bacterial infection or co-infection with other viruses. If  result is PRESUMPTIVE POSTIVE SARS-CoV-2 nucleic acids MAY BE PRESENT.   A presumptive positive result was obtained on the submitted specimen  and confirmed on repeat testing.  While 2019 novel coronavirus  (SARS-CoV-2) nucleic acids may be present in the submitted sample  additional confirmatory testing may be necessary for epidemiological  and / or clinical management purposes  to differentiate between  SARS-CoV-2 and other Sarbecovirus currently known to infect humans.  If clinically indicated additional testing with an alternate test  methodology [phone]) is advised. The SARS-CoV-2 RNA is generally  detectable in upper and lower respiratory sp ecimens during the acute  phase of infection. The expected result is Negative. Fact Sheet for Patients:  StrictlyIdeas.no Fact Sheet for Healthcare Providers: BankingDealers.co.za This test is not yet approved or cleared by the [place] FDA and has been authorized for detection and/or diagnosis of SARS-CoV-2 by FDA under an Emergency Use Authorization (EUA).  This EUA will remain in effect (meaning this test can be used) for the duration of the COVID-19 declaration under Section 564(b)(1) of the Act, 21 U.S.C. section 360bbb-3(b)(1), unless the authorization is terminated or revoked sooner. Performed at [facility], [address] [place], [place] [address]     Labs: CBC: Recent Labs  Lab 05/12/22 0857 05/12/22 1135 05/13/22 0904 05/14/22 0716  WBC 10.5 9.8 5.5 5.7  HGB 9.9* 8.5* 8.6* 9.0*  HCT 34.6* 29.7* 29.7* 30.9*  MCV 75.5* 76.2* 76.2* 75.7*  PLT 388 328 294 [id number]   Basic Metabolic Panel: Recent Labs  Lab 05/12/22 0857 05/12/22 1135 05/14/22 0716  NA 135  --  140  K 3.7  --  3.3*  CL 105  --  107  CO2 23  --  26  GLUCOSE 116*  --  115*  BUN 15  --  5*  CREATININE 0.84 0.75 0.77  CALCIUM 9.3  --  9.0  MG  --   --  2.3   Liver Function Tests: Recent Labs  Lab  05/12/22 0857  AST 21  ALT 16  ALKPHOS 58  BILITOT 0.2*  PROT 8.2*  ALBUMIN 4.9   CBG: Recent Labs  Lab 05/13/22 0757 05/14/22 0732  GLUCAP 102* 124*    Discharge time spent: greater than 30 minutes.  Signed: Orson Eva, MD [facility] 05/14/2022

## 2022-05-14 NOTE — Assessment & Plan Note (Signed)
Continue pantoprazole. °

## 2022-05-14 NOTE — Progress Notes (Signed)
CCMD called Pts HR was 160 and sustaining. Nurse walked in room and Pt was standing up washing his hands at the sink. Pt sat down and Vitals obtained. MD notified and EKG was completed.

## 2022-05-14 NOTE — Assessment & Plan Note (Signed)
-  Personally reviewed EKG--sinus, nonspecific STT changes -Personally reviewed tele>>SVT/atrial tachycardia--lasted ~15 min Start metoprolol 12.5 mg bid Echo TSH

## 2022-05-14 NOTE — Assessment & Plan Note (Signed)
-   Presentation is most consistent with possible gastric outlet obstruction -Reporting of nausea vomiting with any oral intake -GI consult appreciated -5/30 EGD--duodenal stricture with retained food in gastric fundus -continue full liquids per GI

## 2022-05-14 NOTE — Progress Notes (Signed)
Nurse went to room and Pt was not in there. Pt had pulled both Ivs out and pt had removed all of is belongings. MD, security and charge nurse notified.

## 2022-05-14 NOTE — Assessment & Plan Note (Signed)
Replete Mag 2.3

## 2022-05-14 NOTE — Progress Notes (Addendum)
PROGRESS NOTE  Alan Jennings [id number] DOB: Jul 21, 1981 DOA: 05/12/2022 PCP: Dionisio Paschal, NP  Brief History:  Alan Jennings is a 41 year old male with extensive history of previous GI bleed, depression, anxiety, polypharmacy including Abilify, Suboxone, bupropion, citalopram, lamotrigine Seroquel presenting with anxiety stating that he has spitting coffee-ground material, minimum vomiting no frank bleeding with vomitus., not feeling well for the past 24 hours prior to admission.  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  Stating that he has been off Protonix since past month when he started noticing some burning sensation in his abdomen.   Denies any alcohol tobacco or drug use.  Denies of using NSAIDs.  ED: Blood pressure 130/77, pulse (!) 109, temperature 98.9 F (37.2 C), temperature source Oral, resp. rate 12, height 5\' 8"  (1.727 m), weight 68 kg, SpO2 100 %.    Latest Ref Rng & Units 05/12/2022    8:57 AM 05/16/2019   10:18 AM 05/16/2019    6:11 AM  CBC  WBC 4.0 - 10.5 K/uL 10.5    5.4    Hemoglobin 13.0 - 17.0 g/dL 9.9   9.1   9.3    Hematocrit 39.0 - 52.0 % 34.6   31.0   31.6    Platelets 150 - 400 K/uL 388    309        Latest Ref Rng & Units 05/12/2022    8:57 AM 05/16/2019    6:11 AM 01/23/2019   11:46 PM  BMP  Glucose 70 - 99 mg/dL 116   99   135    BUN 6 - 20 mg/dL 15   12   8     Creatinine 0.61 - 1.24 mg/dL 0.84   0.79   0.85    Sodium 135 - 145 mmol/L 135   138   139    Potassium 3.5 - 5.1 mmol/L 3.7   4.0   2.6    Chloride 98 - 111 mmol/L 105   102   108    CO2 22 - 32 mmol/L 23   21   21     Calcium 8.9 - 10.3 mg/dL 9.3   9.3   8.7     ED provider has discussed the case with gastroenterologist, who agreed to  pursue with EGD this admssion.  Admitted for possible upper GI bleed.      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    Seizures ([place]) -continue home medication of Lamictal  Gastric outlet obstruction - Presentation is most consistent with possible gastric outlet obstruction -Reporting of nausea vomiting with any oral intake -GI consult appreciated -5/30 EGD--duodenal stricture with retained food in gastric fundus -continue full liquids per GI  Supraventricular tachycardia ([place]) -Personally  reviewed EKG--sinus, nonspecific STT changes -Personally reviewed tele>>SVT/atrial tachycardia--lasted ~15 min Start metoprolol 12.5 mg bid Echo TSH  Opioid dependence ([place]) - Currently on Suboxone, will be continued -As needed analgesics PDMP reviewed--receives monthly buprenorphine #90, last 04/15/22  HTN (hypertension) D/c losartan since metoprolol started to allow BP margin for titration  Gastroesophageal reflux disease without esophagitis Continue pantoprazole  Hypokalemia Replete Mag 2.3     Family Communication:   Family at bedside  Consultants:  GI  Code Status:  FULL   DVT Prophylaxis:  SCDs   Procedures: As Listed in Progress Note Above  Antibiotics: None      Subjective: Patient denies fevers, chills, headache, chest pain, dyspnea, nausea, vomiting,  diarrhea, abdominal pain, dysuria, hematuria, hematochezia, and melena. Had episode tachycardia around 7:05 AM up to 150-160--felt heart racing  Objective: Vitals:   05/13/22 2125 05/14/22 0500 05/14/22 0508 05/14/22 0731  BP: 125/84  135/78 (!) 152/93  Pulse: 74  83 100  Resp: 17  17 17   Temp: 98.7 F (37.1 C)  99.3 F (37.4 C) 98.4 F (36.9 C)  TempSrc: Oral   Oral  SpO2: 97%  98% 99%  Weight:  60.5 kg    Height:  5\' 8"  (1.727 m)      Intake/Output Summary (Last 24 hours) at 05/14/2022 [id number] Last data filed at 05/14/2022 0500 Gross per 24 hour  Intake 2130 ml  Output --  Net 2130 ml   Weight change: -7.24 kg Exam:  General:  Pt is alert, follows commands appropriately, not in acute distress HEENT: No icterus, No thrush, No neck mass, [place]/AT Cardiovascular: RRR, S1/S2, no rubs, no gallops Respiratory: CTA bilaterally, no wheezing, no crackles, no rhonchi Abdomen: Soft/+BS, non tender, non distended, no guarding Extremities: No edema, No lymphangitis, No petechiae, No rashes, no synovitis   Data Reviewed: I have personally reviewed following labs and imaging studies Basic Metabolic Panel: Recent Labs  Lab 05/12/22 0857 05/12/22 1135 05/14/22 0716  NA 135  --  140  K 3.7  --  3.3*  CL 105  --  107  CO2 23  --  26  GLUCOSE 116*  --  115*  BUN 15  --  5*  CREATININE 0.84 0.75 0.77  CALCIUM 9.3  --  9.0  MG  --   --  2.3   Liver Function Tests: Recent Labs  Lab 05/12/22 0857  AST 21  ALT 16  ALKPHOS 58  BILITOT 0.2*  PROT 8.2*  ALBUMIN 4.9   Recent Labs  Lab 05/12/22 0857  LIPASE 40   No results for input(s): AMMONIA in the last 168 hours. Coagulation Profile: Recent Labs  Lab 05/12/22 0857  INR 1.0   CBC: Recent Labs  Lab 05/12/22 0857 05/12/22 1135 05/13/22 0904 05/14/22 0716  WBC 10.5 9.8 5.5 5.7  HGB 9.9* 8.5* 8.6* 9.0*  HCT 34.6* 29.7* 29.7* 30.9*  MCV 75.5* 76.2* 76.2* 75.7*  PLT 388 328 294 297   Cardiac Enzymes: No results  for input(s): CKTOTAL, CKMB, CKMBINDEX, TROPONINI in the last 168 hours. BNP: Invalid input(s): POCBNP CBG: Recent Labs  Lab 05/13/22 0757 05/14/22 0732  GLUCAP 102* 124*   HbA1C: No results for input(s): HGBA1C in the last 72 hours. Urine analysis:    Component Value Date/Time   COLORURINE COLORLESS (A) 05/16/2019 0611   APPEARANCEUR CLEAR 05/16/2019 0611   LABSPEC 1.003 (L) 05/16/2019 0611   PHURINE 8.0 05/16/2019 0611   GLUCOSEU NEGATIVE 05/16/2019 0611   HGBUR NEGATIVE  05/16/2019 0611   BILIRUBINUR NEGATIVE 05/16/2019 0611   KETONESUR NEGATIVE 05/16/2019 0611   PROTEINUR NEGATIVE 05/16/2019 0611   UROBILINOGEN 0.2 02/18/2012 1548   NITRITE NEGATIVE 05/16/2019 0611   LEUKOCYTESUR NEGATIVE 05/16/2019 0611   Sepsis Labs: @LABRCNTIP (procalcitonin:4,lacticidven:4) )No results found for this or any previous visit (from the past 240 hour(s)).   Scheduled Meds:  buprenorphine  8 mg Sublingual Q8H   busPIRone  30 mg Oral BID   escitalopram  10 mg Oral QHS   lamoTRIgine  200 mg Oral Daily   metoprolol tartrate  12.5 mg Oral BID   pantoprazole (PROTONIX) IV  40 mg Intravenous Q12H   sodium chloride flush  3 mL Intravenous Q12H   sodium chloride flush  3 mL Intravenous Q12H   sucralfate  1 g Oral TID WC & HS   Continuous Infusions:  sodium chloride     dextrose 5 % and 0.45% NaCl 100 mL/hr at 05/14/22 0331    Procedures/Studies: CT ABDOMEN PELVIS W CONTRAST  Result Date: 05/12/2022 CLINICAL DATA:  Acute abdominal pain. Hematemesis. Previous gastric ulcers and gastric artery embolization. EXAM: CT ABDOMEN AND PELVIS WITH CONTRAST TECHNIQUE: Multidetector CT imaging of the abdomen and pelvis was performed using the standard protocol following bolus administration of intravenous contrast. RADIATION DOSE REDUCTION: This exam was performed according to the departmental dose-optimization program which includes automated exposure control, adjustment of the mA and/or kV according  to patient size and/or use of iterative reconstruction technique. CONTRAST:  13mL OMNIPAQUE IOHEXOL 300 MG/ML  SOLN COMPARISON:  05/16/2019 FINDINGS: Lower Chest: No acute findings. Hepatobiliary: No hepatic masses identified. Gallbladder is unremarkable. No evidence of biliary ductal dilatation. Pancreas:  No mass or inflammatory changes. Spleen: Within normal limits in size and appearance. Adrenals/Urinary Tract: No masses identified. No evidence of ureteral calculi or hydronephrosis. Stomach/Bowel: Stomach is distended, however there is no evidence of dilated bowel loops. No evidence of inflammatory process or abnormal fluid collections. Vascular/Lymphatic: No pathologically enlarged lymph nodes. Embolization coils noted in the expected region of the gastroduodenal artery. No acute vascular findings. Reproductive:  No mass or other significant abnormality. Other:  None. Musculoskeletal:  No suspicious bone lesions identified. IMPRESSION: Distended stomach, which may be due to recent p.o. intake although gastroparesis or gastric outlet obstruction cannot be excluded. Clinical correlation is recommended. No other significant abnormality identified. Electronically Signed   By: Marlaine Hind M.D.   On: 05/12/2022 10:28    Orson Eva, DO  [facility]  If 7PM-7AM, please contact night-coverage [email or web address] Password [email or web address] 05/14/2022, 8:21 AM   LOS: 2 days

## 2022-05-14 NOTE — Assessment & Plan Note (Addendum)
-   Currently on Suboxone, will be continued -As needed analgesics PDMP reviewed--receives monthly buprenorphine #90, last 04/15/22

## 2022-05-14 NOTE — Assessment & Plan Note (Signed)
-  PTSD associated with depression anxiety and chronic pain -Home medications eviewed, will be resumed accordingly -Home medication includes: BuSpar, escitalopram, Lamictal, Suboxone - Also as needed Seroquel, Xanax.Marland Kitchen

## 2022-05-15 ENCOUNTER — Other Ambulatory Visit (HOSPITAL_COMMUNITY): Payer: Self-pay

## 2022-05-15 MED ORDER — ESCITALOPRAM OXALATE 10 MG PO TABS
ORAL_TABLET | Freq: Every day | ORAL | 0 refills | Status: DC
  Filled 2022-05-15: qty 30, 30d supply, fill #0

## 2022-05-15 MED ORDER — LAMOTRIGINE 200 MG PO TABS
ORAL_TABLET | Freq: Every day | ORAL | 0 refills | Status: DC
  Filled 2022-05-15: qty 30, 30d supply, fill #0

## 2022-05-15 MED ORDER — LORAZEPAM 0.5 MG PO TABS
ORAL_TABLET | ORAL | 0 refills | Status: DC
  Filled 2022-05-15: qty 15, 30d supply, fill #0

## 2022-05-15 MED ORDER — BUPRENORPHINE HCL 8 MG SL SUBL
SUBLINGUAL_TABLET | SUBLINGUAL | 0 refills | Status: DC
  Filled 2022-05-15: qty 90, 30d supply, fill #0

## 2022-05-15 MED ORDER — BUSPIRONE HCL 30 MG PO TABS
ORAL_TABLET | ORAL | 0 refills | Status: DC
  Filled 2022-05-15: qty 60, 30d supply, fill #0

## 2022-05-15 MED ORDER — QUETIAPINE FUMARATE 50 MG PO TABS
ORAL_TABLET | ORAL | 0 refills | Status: DC
  Filled 2022-05-15: qty 30, 30d supply, fill #0

## 2022-05-15 MED ORDER — PANTOPRAZOLE SODIUM 40 MG PO TBEC
DELAYED_RELEASE_TABLET | Freq: Two times a day (BID) | ORAL | 0 refills | Status: DC
  Filled 2022-05-15 (×2): qty 30, 15d supply, fill #0
  Filled 2022-05-15: qty 60, 30d supply, fill #0
  Filled 2022-05-29: qty 30, 30d supply, fill #1

## 2022-05-16 ENCOUNTER — Other Ambulatory Visit (HOSPITAL_COMMUNITY): Payer: Self-pay

## 2022-05-24 ENCOUNTER — Other Ambulatory Visit (HOSPITAL_COMMUNITY): Payer: Self-pay

## 2022-05-29 ENCOUNTER — Other Ambulatory Visit (HOSPITAL_COMMUNITY): Payer: Self-pay

## 2022-06-10 ENCOUNTER — Other Ambulatory Visit (HOSPITAL_COMMUNITY): Payer: Self-pay

## 2022-06-10 MED ORDER — ESCITALOPRAM OXALATE 10 MG PO TABS
ORAL_TABLET | Freq: Every day | ORAL | 0 refills | Status: DC
  Filled 2022-06-10: qty 90, 90d supply, fill #0

## 2022-06-10 MED ORDER — QUETIAPINE FUMARATE 50 MG PO TABS: ORAL_TABLET | ORAL | 0 refills | Status: DC

## 2022-06-10 MED ORDER — BUPRENORPHINE HCL 8 MG SL SUBL
SUBLINGUAL_TABLET | SUBLINGUAL | 0 refills | Status: DC
  Filled 2022-06-10: qty 90, 30d supply, fill #0

## 2022-06-10 MED ORDER — PANTOPRAZOLE SODIUM 40 MG PO TBEC
DELAYED_RELEASE_TABLET | ORAL | 0 refills | Status: DC
  Filled 2022-06-10 (×2): qty 180, 90d supply, fill #0

## 2022-06-10 MED ORDER — PANTOPRAZOLE SODIUM 40 MG PO TBEC
DELAYED_RELEASE_TABLET | ORAL | 0 refills | Status: DC
  Filled 2022-10-28: qty 60, 30d supply, fill #0

## 2022-06-10 MED ORDER — BUSPIRONE HCL 30 MG PO TABS
ORAL_TABLET | ORAL | 0 refills | Status: DC
  Filled 2022-06-10: qty 60, 30d supply, fill #0

## 2022-06-10 MED ORDER — QUETIAPINE FUMARATE 50 MG PO TABS
ORAL_TABLET | ORAL | 0 refills | Status: DC
  Filled 2022-06-10: qty 90, 90d supply, fill #0

## 2022-06-10 MED ORDER — LAMOTRIGINE 200 MG PO TABS
ORAL_TABLET | Freq: Every day | ORAL | 0 refills | Status: DC
  Filled 2022-06-10: qty 90, 90d supply, fill #0

## 2022-06-10 MED ORDER — ESCITALOPRAM OXALATE 10 MG PO TABS: ORAL_TABLET | Freq: Every day | ORAL | 0 refills | Status: DC

## 2022-06-10 MED ORDER — LORAZEPAM 0.5 MG PO TABS
ORAL_TABLET | ORAL | 0 refills | Status: DC
  Filled 2022-06-10: qty 15, 30d supply, fill #0

## 2022-06-11 ENCOUNTER — Other Ambulatory Visit (HOSPITAL_COMMUNITY): Payer: Self-pay

## 2022-06-12 ENCOUNTER — Other Ambulatory Visit (HOSPITAL_COMMUNITY): Payer: Self-pay

## 2022-07-08 ENCOUNTER — Other Ambulatory Visit (HOSPITAL_COMMUNITY): Payer: Self-pay

## 2022-07-08 MED ORDER — LORAZEPAM 0.5 MG PO TABS
ORAL_TABLET | ORAL | 0 refills | Status: DC
  Filled 2022-07-08: qty 15, 30d supply, fill #0

## 2022-07-08 MED ORDER — BUPRENORPHINE HCL 8 MG SL SUBL
SUBLINGUAL_TABLET | SUBLINGUAL | 0 refills | Status: DC
  Filled 2022-07-08: qty 60, 20d supply, fill #0
  Filled 2022-07-25: qty 30, 10d supply, fill #1

## 2022-07-25 ENCOUNTER — Other Ambulatory Visit (HOSPITAL_COMMUNITY): Payer: Self-pay

## 2022-08-05 ENCOUNTER — Other Ambulatory Visit (HOSPITAL_COMMUNITY): Payer: Self-pay

## 2022-08-05 MED ORDER — LORAZEPAM 0.5 MG PO TABS
ORAL_TABLET | ORAL | 0 refills | Status: DC
  Filled 2022-08-05: qty 15, 30d supply, fill #0

## 2022-08-05 MED ORDER — BUPRENORPHINE HCL 8 MG SL SUBL
SUBLINGUAL_TABLET | SUBLINGUAL | 0 refills | Status: DC
  Filled 2022-08-05: qty 90, 30d supply, fill #0

## 2022-09-02 ENCOUNTER — Other Ambulatory Visit (HOSPITAL_COMMUNITY): Payer: Self-pay

## 2022-09-02 MED ORDER — QUETIAPINE FUMARATE 50 MG PO TABS
50.0000 mg | ORAL_TABLET | Freq: Every evening | ORAL | 0 refills | Status: DC
  Filled 2022-09-02: qty 90, 90d supply, fill #0

## 2022-09-02 MED ORDER — PANTOPRAZOLE SODIUM 40 MG PO TBEC
40.0000 mg | DELAYED_RELEASE_TABLET | Freq: Two times a day (BID) | ORAL | 0 refills | Status: DC
  Filled 2022-09-02: qty 60, 30d supply, fill #0
  Filled 2022-09-30: qty 60, 30d supply, fill #1
  Filled 2022-11-28: qty 60, 30d supply, fill #2

## 2022-09-02 MED ORDER — LAMOTRIGINE 200 MG PO TABS
200.0000 mg | ORAL_TABLET | Freq: Every day | ORAL | 0 refills | Status: DC
  Filled 2022-09-02: qty 90, 90d supply, fill #0

## 2022-09-02 MED ORDER — BUPRENORPHINE HCL 8 MG SL SUBL
8.0000 mg | SUBLINGUAL_TABLET | Freq: Three times a day (TID) | SUBLINGUAL | 0 refills | Status: DC
Start: 2022-09-02 — End: 2022-09-29
  Filled 2022-09-02: qty 90, 30d supply, fill #0

## 2022-09-02 MED ORDER — LORAZEPAM 0.5 MG PO TABS
0.5000 mg | ORAL_TABLET | Freq: Every day | ORAL | 0 refills | Status: DC | PRN
  Filled 2022-09-02: qty 15, 30d supply, fill #0

## 2022-09-02 MED ORDER — BUSPIRONE HCL 30 MG PO TABS
30.0000 mg | ORAL_TABLET | Freq: Two times a day (BID) | ORAL | 0 refills | Status: DC
  Filled 2022-09-02: qty 60, 30d supply, fill #0

## 2022-09-03 ENCOUNTER — Other Ambulatory Visit (HOSPITAL_COMMUNITY): Payer: Self-pay

## 2022-09-29 ENCOUNTER — Other Ambulatory Visit (HOSPITAL_COMMUNITY): Payer: Self-pay

## 2022-09-29 MED ORDER — BUPRENORPHINE HCL 8 MG SL SUBL
8.0000 mg | SUBLINGUAL_TABLET | Freq: Three times a day (TID) | SUBLINGUAL | 0 refills | Status: DC
  Filled 2022-09-30: qty 69, 23d supply, fill #0
  Filled 2022-09-30: qty 21, 7d supply, fill #0

## 2022-09-29 MED ORDER — LORAZEPAM 0.5 MG PO TABS
0.5000 mg | ORAL_TABLET | Freq: Every day | ORAL | 0 refills | Status: DC
  Filled 2022-09-30: qty 15, 15d supply, fill #0

## 2022-09-30 ENCOUNTER — Other Ambulatory Visit (HOSPITAL_COMMUNITY): Payer: Self-pay

## 2022-10-04 ENCOUNTER — Other Ambulatory Visit (HOSPITAL_COMMUNITY): Payer: Self-pay

## 2022-10-24 ENCOUNTER — Other Ambulatory Visit (HOSPITAL_COMMUNITY): Payer: Self-pay

## 2022-10-27 ENCOUNTER — Other Ambulatory Visit (HOSPITAL_COMMUNITY): Payer: Self-pay

## 2022-10-27 MED ORDER — BUPRENORPHINE HCL 8 MG SL SUBL
8.0000 mg | SUBLINGUAL_TABLET | Freq: Three times a day (TID) | SUBLINGUAL | 0 refills | Status: DC
  Filled 2022-10-27 – 2022-10-28 (×2): qty 90, 30d supply, fill #0

## 2022-10-27 MED ORDER — PRAZOSIN HCL 2 MG PO CAPS
2.0000 mg | ORAL_CAPSULE | Freq: Every day | ORAL | 0 refills | Status: DC
  Filled 2022-10-27: qty 30, 30d supply, fill #0

## 2022-10-27 MED ORDER — LORAZEPAM 0.5 MG PO TABS
0.5000 mg | ORAL_TABLET | Freq: Every day | ORAL | 0 refills | Status: DC | PRN
  Filled 2022-10-27 – 2022-10-28 (×2): qty 15, 15d supply, fill #0

## 2022-10-28 ENCOUNTER — Other Ambulatory Visit (HOSPITAL_COMMUNITY): Payer: Self-pay

## 2022-11-25 ENCOUNTER — Other Ambulatory Visit (HOSPITAL_COMMUNITY): Payer: Self-pay

## 2022-11-25 MED ORDER — PRAZOSIN HCL 2 MG PO CAPS
2.0000 mg | ORAL_CAPSULE | Freq: Every evening | ORAL | 0 refills | Status: DC
  Filled 2022-11-25: qty 30, 30d supply, fill #0

## 2022-11-25 MED ORDER — LORAZEPAM 0.5 MG PO TABS
0.5000 mg | ORAL_TABLET | Freq: Every day | ORAL | 0 refills | Status: DC | PRN
  Filled 2022-11-25: qty 15, 15d supply, fill #0

## 2022-11-25 MED ORDER — BUPRENORPHINE HCL 8 MG SL SUBL
8.0000 mg | SUBLINGUAL_TABLET | Freq: Three times a day (TID) | SUBLINGUAL | 0 refills | Status: DC
  Filled 2022-11-25: qty 90, 30d supply, fill #0

## 2022-11-28 ENCOUNTER — Other Ambulatory Visit (HOSPITAL_COMMUNITY): Payer: Self-pay

## 2022-11-29 ENCOUNTER — Other Ambulatory Visit (HOSPITAL_COMMUNITY): Payer: Self-pay

## 2022-12-23 ENCOUNTER — Other Ambulatory Visit (HOSPITAL_COMMUNITY): Payer: Self-pay

## 2022-12-23 MED ORDER — PANTOPRAZOLE SODIUM 40 MG PO TBEC
40.0000 mg | DELAYED_RELEASE_TABLET | Freq: Two times a day (BID) | ORAL | 0 refills | Status: DC
  Filled 2022-12-23: qty 180, 90d supply, fill #0

## 2022-12-23 MED ORDER — QUETIAPINE FUMARATE 25 MG PO TABS
25.0000 mg | ORAL_TABLET | Freq: Every day | ORAL | 0 refills | Status: DC | PRN
  Filled 2022-12-23: qty 30, 30d supply, fill #0

## 2022-12-23 MED ORDER — LORAZEPAM 0.5 MG PO TABS
0.5000 mg | ORAL_TABLET | Freq: Every day | ORAL | 0 refills | Status: DC | PRN
  Filled 2022-12-23: qty 15, 30d supply, fill #0

## 2022-12-23 MED ORDER — BUSPIRONE HCL 30 MG PO TABS
30.0000 mg | ORAL_TABLET | Freq: Two times a day (BID) | ORAL | 0 refills | Status: DC
  Filled 2022-12-23: qty 180, 90d supply, fill #0

## 2022-12-23 MED ORDER — BUPRENORPHINE HCL 8 MG SL SUBL
8.0000 mg | SUBLINGUAL_TABLET | Freq: Three times a day (TID) | SUBLINGUAL | 0 refills | Status: DC
  Filled 2022-12-23: qty 90, 30d supply, fill #0

## 2022-12-23 MED ORDER — PRAZOSIN HCL 2 MG PO CAPS
2.0000 mg | ORAL_CAPSULE | Freq: Every evening | ORAL | 0 refills | Status: DC
  Filled 2022-12-23: qty 30, 30d supply, fill #0

## 2022-12-23 MED ORDER — LAMOTRIGINE 200 MG PO TABS
200.0000 mg | ORAL_TABLET | Freq: Every day | ORAL | 0 refills | Status: DC
  Filled 2022-12-23: qty 90, 90d supply, fill #0

## 2022-12-24 ENCOUNTER — Other Ambulatory Visit (HOSPITAL_COMMUNITY): Payer: Self-pay

## 2023-01-20 ENCOUNTER — Other Ambulatory Visit (HOSPITAL_COMMUNITY): Payer: Self-pay

## 2023-01-20 MED ORDER — BUPRENORPHINE HCL 8 MG SL SUBL
8.0000 mg | SUBLINGUAL_TABLET | Freq: Three times a day (TID) | SUBLINGUAL | 0 refills | Status: DC
  Filled 2023-01-20: qty 90, 30d supply, fill #0

## 2023-01-20 MED ORDER — PRAZOSIN HCL 2 MG PO CAPS
2.0000 mg | ORAL_CAPSULE | Freq: Every evening | ORAL | 0 refills | Status: DC
  Filled 2023-01-20: qty 30, 30d supply, fill #0

## 2023-01-20 MED ORDER — LORAZEPAM 0.5 MG PO TABS
0.5000 mg | ORAL_TABLET | Freq: Every day | ORAL | 0 refills | Status: DC | PRN
  Filled 2023-01-20: qty 15, 30d supply, fill #0

## 2023-02-17 ENCOUNTER — Other Ambulatory Visit (HOSPITAL_COMMUNITY): Payer: Self-pay

## 2023-02-17 MED ORDER — BUPRENORPHINE HCL 8 MG SL SUBL
SUBLINGUAL_TABLET | SUBLINGUAL | 0 refills | Status: DC
  Filled 2023-02-17: qty 90, 30d supply, fill #0

## 2023-02-17 MED ORDER — LORAZEPAM 0.5 MG PO TABS
0.5000 mg | ORAL_TABLET | Freq: Every day | ORAL | 0 refills | Status: DC | PRN
  Filled 2023-02-17: qty 15, 30d supply, fill #0

## 2023-02-17 MED ORDER — PRAZOSIN HCL 2 MG PO CAPS
2.0000 mg | ORAL_CAPSULE | Freq: Every evening | ORAL | 0 refills | Status: DC
  Filled 2023-02-17: qty 30, 30d supply, fill #0

## 2023-02-17 MED ORDER — BUSPIRONE HCL 30 MG PO TABS
30.0000 mg | ORAL_TABLET | Freq: Two times a day (BID) | ORAL | 0 refills | Status: DC
  Filled 2023-02-17: qty 180, 90d supply, fill #0

## 2023-02-17 MED ORDER — LAMOTRIGINE 200 MG PO TABS
200.0000 mg | ORAL_TABLET | Freq: Every day | ORAL | 0 refills | Status: DC
  Filled 2023-02-17: qty 90, 90d supply, fill #0

## 2023-02-17 MED ORDER — QUETIAPINE FUMARATE 25 MG PO TABS
25.0000 mg | ORAL_TABLET | Freq: Every day | ORAL | 0 refills | Status: DC | PRN
  Filled 2023-02-17: qty 30, 30d supply, fill #0

## 2023-03-17 ENCOUNTER — Other Ambulatory Visit (HOSPITAL_COMMUNITY): Payer: Self-pay

## 2023-03-17 MED ORDER — LORAZEPAM 0.5 MG PO TABS
0.5000 mg | ORAL_TABLET | Freq: Every day | ORAL | 0 refills | Status: DC | PRN
  Filled 2023-03-17: qty 15, 30d supply, fill #0

## 2023-03-17 MED ORDER — BUPRENORPHINE HCL 8 MG SL SUBL
8.0000 mg | SUBLINGUAL_TABLET | Freq: Three times a day (TID) | SUBLINGUAL | 0 refills | Status: DC
  Filled 2023-03-17: qty 90, 30d supply, fill #0

## 2023-04-14 ENCOUNTER — Other Ambulatory Visit (HOSPITAL_COMMUNITY): Payer: Self-pay

## 2023-04-14 MED ORDER — LORAZEPAM 0.5 MG PO TABS
0.5000 mg | ORAL_TABLET | Freq: Every day | ORAL | 0 refills | Status: DC | PRN
  Filled 2023-04-14: qty 15, 15d supply, fill #0

## 2023-04-14 MED ORDER — BUPRENORPHINE HCL 8 MG SL SUBL
8.0000 mg | SUBLINGUAL_TABLET | Freq: Three times a day (TID) | SUBLINGUAL | 0 refills | Status: DC
  Filled 2023-04-14 (×2): qty 90, 30d supply, fill #0

## 2023-05-12 ENCOUNTER — Other Ambulatory Visit: Payer: Self-pay

## 2023-05-12 ENCOUNTER — Other Ambulatory Visit (HOSPITAL_COMMUNITY): Payer: Self-pay

## 2023-05-12 MED ORDER — PRAZOSIN HCL 2 MG PO CAPS
2.0000 mg | ORAL_CAPSULE | Freq: Every day | ORAL | 0 refills | Status: DC
  Filled 2023-05-12: qty 30, 30d supply, fill #0

## 2023-05-12 MED ORDER — QUETIAPINE FUMARATE 25 MG PO TABS
25.0000 mg | ORAL_TABLET | Freq: Every day | ORAL | 0 refills | Status: DC | PRN
  Filled 2023-05-12: qty 30, 30d supply, fill #0

## 2023-05-12 MED ORDER — BUSPIRONE HCL 30 MG PO TABS
ORAL_TABLET | ORAL | 0 refills | Status: DC
  Filled 2023-05-12: qty 180, 90d supply, fill #0

## 2023-05-12 MED ORDER — BUPRENORPHINE HCL 8 MG SL SUBL
8.0000 mg | SUBLINGUAL_TABLET | Freq: Three times a day (TID) | SUBLINGUAL | 0 refills | Status: DC
  Filled 2023-05-12: qty 90, 30d supply, fill #0

## 2023-05-12 MED ORDER — LORAZEPAM 0.5 MG PO TABS
0.5000 mg | ORAL_TABLET | Freq: Every day | ORAL | 0 refills | Status: DC
  Filled 2023-05-12: qty 15, 30d supply, fill #0

## 2023-05-12 MED ORDER — LAMOTRIGINE 200 MG PO TABS
200.0000 mg | ORAL_TABLET | Freq: Every day | ORAL | 0 refills | Status: DC
  Filled 2023-05-12: qty 90, 90d supply, fill #0

## 2023-05-20 ENCOUNTER — Encounter (HOSPITAL_COMMUNITY): Payer: Self-pay

## 2023-05-20 ENCOUNTER — Inpatient Hospital Stay (HOSPITAL_COMMUNITY)
Admission: EM | Admit: 2023-05-20 | Discharge: 2023-05-22 | DRG: 378 | Disposition: A | Discharge status: Home / Self Care | Payer: Medicaid Other | Attending: Internal Medicine | Admitting: Internal Medicine

## 2023-05-20 ENCOUNTER — Other Ambulatory Visit: Payer: Self-pay

## 2023-05-20 DIAGNOSIS — Z885 Allergy status to narcotic agent status: Secondary | ICD-10-CM | POA: Diagnosis not present

## 2023-05-20 DIAGNOSIS — F32A Depression, unspecified: Secondary | ICD-10-CM | POA: Diagnosis present

## 2023-05-20 DIAGNOSIS — G40909 Epilepsy, unspecified, not intractable, without status epilepticus: Secondary | ICD-10-CM | POA: Diagnosis present

## 2023-05-20 DIAGNOSIS — I10 Essential (primary) hypertension: Secondary | ICD-10-CM | POA: Diagnosis present

## 2023-05-20 DIAGNOSIS — K264 Chronic or unspecified duodenal ulcer with hemorrhage: Principal | ICD-10-CM | POA: Diagnosis present

## 2023-05-20 DIAGNOSIS — K922 Gastrointestinal hemorrhage, unspecified: Secondary | ICD-10-CM | POA: Diagnosis present

## 2023-05-20 DIAGNOSIS — K269 Duodenal ulcer, unspecified as acute or chronic, without hemorrhage or perforation: Secondary | ICD-10-CM | POA: Diagnosis not present

## 2023-05-20 DIAGNOSIS — Z79899 Other long term (current) drug therapy: Secondary | ICD-10-CM | POA: Diagnosis not present

## 2023-05-20 DIAGNOSIS — F17290 Nicotine dependence, other tobacco product, uncomplicated: Secondary | ICD-10-CM | POA: Diagnosis present

## 2023-05-20 DIAGNOSIS — K219 Gastro-esophageal reflux disease without esophagitis: Secondary | ICD-10-CM | POA: Diagnosis present

## 2023-05-20 DIAGNOSIS — Z87891 Personal history of nicotine dependence: Secondary | ICD-10-CM | POA: Diagnosis not present

## 2023-05-20 DIAGNOSIS — F419 Anxiety disorder, unspecified: Secondary | ICD-10-CM | POA: Diagnosis present

## 2023-05-20 DIAGNOSIS — F17210 Nicotine dependence, cigarettes, uncomplicated: Secondary | ICD-10-CM | POA: Diagnosis present

## 2023-05-20 DIAGNOSIS — F4310 Post-traumatic stress disorder, unspecified: Secondary | ICD-10-CM | POA: Diagnosis present

## 2023-05-20 DIAGNOSIS — G8929 Other chronic pain: Secondary | ICD-10-CM | POA: Diagnosis present

## 2023-05-20 DIAGNOSIS — R569 Unspecified convulsions: Secondary | ICD-10-CM

## 2023-05-20 DIAGNOSIS — K315 Obstruction of duodenum: Secondary | ICD-10-CM | POA: Diagnosis present

## 2023-05-20 DIAGNOSIS — D649 Anemia, unspecified: Secondary | ICD-10-CM

## 2023-05-20 DIAGNOSIS — K2970 Gastritis, unspecified, without bleeding: Secondary | ICD-10-CM | POA: Diagnosis present

## 2023-05-20 DIAGNOSIS — Z886 Allergy status to analgesic agent status: Secondary | ICD-10-CM | POA: Diagnosis not present

## 2023-05-20 DIAGNOSIS — D62 Acute posthemorrhagic anemia: Secondary | ICD-10-CM | POA: Diagnosis present

## 2023-05-20 DIAGNOSIS — K2971 Gastritis, unspecified, with bleeding: Secondary | ICD-10-CM | POA: Diagnosis not present

## 2023-05-20 LAB — CBC WITH DIFFERENTIAL/PLATELET
Abs Immature Granulocytes: 0.13 10*3/uL — ABNORMAL HIGH (ref 0.00–0.07)
Basophils Absolute: 0 10*3/uL (ref 0.0–0.1)
Basophils Relative: 0 %
Eosinophils Absolute: 0 10*3/uL (ref 0.0–0.5)
Eosinophils Relative: 0 %
HCT: 16.8 % — ABNORMAL LOW (ref 39.0–52.0)
Hemoglobin: 4.5 g/dL — CL (ref 13.0–17.0)
Immature Granulocytes: 1 %
Lymphocytes Relative: 5 %
Lymphs Abs: 1.1 10*3/uL (ref 0.7–4.0)
MCH: 19.4 pg — ABNORMAL LOW (ref 26.0–34.0)
MCHC: 26.8 g/dL — ABNORMAL LOW (ref 30.0–36.0)
MCV: 72.4 fL — ABNORMAL LOW (ref 80.0–100.0)
Monocytes Absolute: 0.4 10*3/uL (ref 0.1–1.0)
Monocytes Relative: 2 %
Neutro Abs: 17.7 10*3/uL — ABNORMAL HIGH (ref 1.7–7.7)
Neutrophils Relative %: 92 %
Platelets: 421 10*3/uL — ABNORMAL HIGH (ref 150–400)
RBC: 2.32 MIL/uL — ABNORMAL LOW (ref 4.22–5.81)
RDW: 17.2 % — ABNORMAL HIGH (ref 11.5–15.5)
WBC: 19.3 10*3/uL — ABNORMAL HIGH (ref 4.0–10.5)
nRBC: 0 % (ref 0.0–0.2)

## 2023-05-20 LAB — COMPREHENSIVE METABOLIC PANEL
ALT: 12 U/L (ref 0–44)
AST: 19 U/L (ref 15–41)
Albumin: 4 g/dL (ref 3.5–5.0)
Alkaline Phosphatase: 40 U/L (ref 38–126)
Anion gap: 15 (ref 5–15)
BUN: 30 mg/dL — ABNORMAL HIGH (ref 6–20)
CO2: 20 mmol/L — ABNORMAL LOW (ref 22–32)
Calcium: 8.9 mg/dL (ref 8.9–10.3)
Chloride: 100 mmol/L (ref 98–111)
Creatinine, Ser: 1.06 mg/dL (ref 0.61–1.24)
GFR, Estimated: >60 mL/min (ref >60)
Glucose, Bld: 119 mg/dL — ABNORMAL HIGH (ref 70–99)
Potassium: 4.1 mmol/L (ref 3.5–5.1)
Sodium: 135 mmol/L (ref 135–145)
Total Bilirubin: 0.4 mg/dL (ref 0.3–1.2)
Total Protein: 8.1 g/dL (ref 6.5–8.1)

## 2023-05-20 LAB — BPAM RBC
Blood Product Expiration Date: 202406282359
Blood Product Expiration Date: 202406292359
ISSUE DATE / TIME: 202406051547
ISSUE DATE / TIME: 202406052036
Unit Type and Rh: 6200
Unit Type and Rh: 6200

## 2023-05-20 LAB — TYPE AND SCREEN

## 2023-05-20 LAB — PREPARE RBC (CROSSMATCH)

## 2023-05-20 MED ORDER — ALBUTEROL SULFATE (2.5 MG/3ML) 0.083% IN NEBU: 2.5000 mg | INHALATION_SOLUTION | RESPIRATORY_TRACT | Status: DC | PRN

## 2023-05-20 MED ORDER — BUPRENORPHINE HCL 2 MG SL SUBL
8.0000 mg | SUBLINGUAL_TABLET | Freq: Three times a day (TID) | SUBLINGUAL | Status: DC
  Administered 2023-05-20 – 2023-05-22 (×6): 8 mg via SUBLINGUAL
  Filled 2023-05-20 (×6): qty 4

## 2023-05-20 MED ORDER — ACETAMINOPHEN 650 MG RE SUPP: 650.0000 mg | Freq: Four times a day (QID) | RECTAL | Status: DC | PRN

## 2023-05-20 MED ORDER — ONDANSETRON HCL 4 MG PO TABS: 4.0000 mg | ORAL_TABLET | Freq: Four times a day (QID) | ORAL | Status: DC | PRN

## 2023-05-20 MED ORDER — LACTATED RINGERS IV BOLUS
1000.0000 mL | Freq: Once | INTRAVENOUS | Status: AC
  Administered 2023-05-20: 1000 mL via INTRAVENOUS

## 2023-05-20 MED ORDER — LORAZEPAM 0.5 MG PO TABS
0.5000 mg | ORAL_TABLET | ORAL | Status: DC | PRN
  Administered 2023-05-20 – 2023-05-22 (×4): 0.5 mg via ORAL
  Filled 2023-05-20 (×4): qty 1

## 2023-05-20 MED ORDER — LAMOTRIGINE 100 MG PO TABS
200.0000 mg | ORAL_TABLET | Freq: Every day | ORAL | Status: DC
  Administered 2023-05-20 – 2023-05-22 (×3): 200 mg via ORAL
  Filled 2023-05-20 (×3): qty 2

## 2023-05-20 MED ORDER — TRAZODONE HCL 50 MG PO TABS: 25.0000 mg | ORAL_TABLET | Freq: Every evening | ORAL | Status: DC | PRN

## 2023-05-20 MED ORDER — SODIUM CHLORIDE 0.9% IV SOLUTION: Freq: Once | INTRAVENOUS | Status: AC

## 2023-05-20 MED ORDER — PANTOPRAZOLE SODIUM 40 MG IV SOLR
40.0000 mg | Freq: Once | INTRAVENOUS | Status: AC
  Administered 2023-05-20: 40 mg via INTRAVENOUS
  Filled 2023-05-20: qty 10

## 2023-05-20 MED ORDER — METOPROLOL TARTRATE 5 MG/5ML IV SOLN: 5.0000 mg | Freq: Four times a day (QID) | INTRAVENOUS | Status: DC | PRN

## 2023-05-20 MED ORDER — ACETAMINOPHEN 325 MG PO TABS
650.0000 mg | ORAL_TABLET | Freq: Four times a day (QID) | ORAL | Status: DC | PRN
  Administered 2023-05-20: 650 mg via ORAL
  Filled 2023-05-20: qty 2

## 2023-05-20 MED ORDER — POLYETHYLENE GLYCOL 3350 17 G PO PACK: 17.0000 g | PACK | Freq: Every day | ORAL | Status: DC | PRN

## 2023-05-20 MED ORDER — ALUM & MAG HYDROXIDE-SIMETH 200-200-20 MG/5ML PO SUSP: 30.0000 mL | Freq: Four times a day (QID) | ORAL | Status: DC | PRN

## 2023-05-20 MED ORDER — ONDANSETRON HCL 4 MG/2ML IJ SOLN: 4.0000 mg | Freq: Four times a day (QID) | INTRAMUSCULAR | Status: DC | PRN

## 2023-05-20 MED ORDER — QUETIAPINE FUMARATE 25 MG PO TABS: 25.0000 mg | ORAL_TABLET | Freq: Every evening | ORAL | Status: DC | PRN

## 2023-05-20 NOTE — Consult Note (Signed)
[facility] Gastroenterology Consult  Referring Provider: No ref. provider found Primary Care Physician:  Vertis Kelch, NP Primary Gastroenterologist: Gentry Fitz  Reason for Consultation: coffee ground emesis, anemia  SUBJECTIVE:   HPI: Alan Jennings is a 42 y.o. male with past medical history significant for duodenal ulcer and duodenal stricture, gastroesophageal reflux disease, hypertension and seizures. Presented to hospital for 1 week history of coffee ground emesis, melena. He noted that he has been experiencing alternating melena over the past 1 year. He takes over the counter omeprazole 20 mg PO daily. He has avoided all NSAID products, he used to take powdered aspirin product. He has been experiencing shortness of breath. He has been pale. He has been unable to work at his normal capacity, works in [employer].   Labs on presentation showed Hgb 4.5, WBC 19.3, PLT 421. EGD 05/03/2022 at [facility] showed duodenal stricture and clean based duodenal ulceration 10 mm.  EGD March 2020 showed [facility] 1a spurting vessel within large duodenal bulb ulcer, not amenable to endoscopic intervention for hemostasis, ultimately required IR evaluation and gastroduodenal artery embolization.  Past Medical History:  Diagnosis Date   Abdominal pain, chronic, epigastric    Anxiety    Arthritis    Back pain    Complication of anesthesia    had a syncopal episode after rhinoplasty   Depression    GERD (gastroesophageal reflux disease)    GI bleed    Headache    History of kidney stones    Hypertension    Neurogenic pain    Peptic ulcer disease    PTSD (post-traumatic stress disorder)    Seizures (HCC)    Seizures (HCC)    Past Surgical History:  Procedure Laterality Date   BIOPSY  06/25/2018   Procedure: BIOPSY;  Surgeon: Malissa Hippo, MD;  Location: [facility];  Service: Endoscopy;;  gastric   ESOPHAGOGASTRODUODENOSCOPY (EGD) WITH PROPOFOL N/A 06/25/2018   Procedure:  ESOPHAGOGASTRODUODENOSCOPY (EGD) WITH PROPOFOL;  Surgeon: Malissa Hippo, MD;  Location: [facility];  Service: Endoscopy;  Laterality: N/A;   ESOPHAGOGASTRODUODENOSCOPY (EGD) WITH PROPOFOL N/A 05/13/2022   Procedure: ESOPHAGOGASTRODUODENOSCOPY (EGD) WITH PROPOFOL;  Surgeon: Dolores Frame, MD;  Location: [facility];  Service: Gastroenterology;  Laterality: N/A;   NOSE SURGERY     [facility]   ORIF HUMERUS FRACTURE  04/26/2012   Procedure: OPEN REDUCTION INTERNAL FIXATION (ORIF) PROXIMAL HUMERUS FRACTURE;  Surgeon: Vickki Hearing, MD;  Location: [facility] ORS;  Service: Orthopedics;  Laterality: Left;  Open Reduction Internal Fixation of Left Proximal Humerus Fracture, Closing Wedge Osteotomy, Bone Graft   RHINOPLASTY     [facility]   SHOULDER SURGERY     Prior to Admission medications   Medication Sig Start Date End Date Taking? Authorizing Provider  acetaminophen (TYLENOL) 500 MG tablet Take 1,500 mg by mouth every 6 (six) hours as needed for moderate pain.    [provider]  buprenorphine (SUBUTEX) 8 MG SUBL SL tablet Place 1 tablet (8 mg total) under the tongue 3 (three) times daily. 05/12/23     busPIRone (BUSPAR) 30 MG tablet Take 1 tablet by mouth 2 times a day (take with food) 06/10/22     busPIRone (BUSPAR) 30 MG tablet Take 1 tablet (30 mg total) by mouth 2 (two) times daily with food 09/02/22     busPIRone (BUSPAR) 30 MG tablet Take 1 tablet (30 mg total) by mouth 2 (two) times daily with a meal. 12/23/22     busPIRone (  BUSPAR) 30 MG tablet Take 1 tablet (30 mg total) by mouth 2 (two) times daily with food 02/17/23     busPIRone (BUSPAR) 30 MG tablet Take 1 tablet orally 2 times a day (take with food) 05/12/23     escitalopram (LEXAPRO) 10 MG tablet Take 1 tablet by mouth daily 06/10/22     escitalopram (LEXAPRO) 10 MG tablet Take 1 tablet by mouth daily 06/10/22     lamoTRIgine (LAMICTAL) 200 MG tablet Take 1 tablet by mouth daily 06/10/22     lamoTRIgine (LAMICTAL) 200 MG tablet  Take 1 tablet (200 mg total) by mouth daily. 09/02/22     lamoTRIgine (LAMICTAL) 200 MG tablet Take 1 tablet (200 mg total) by mouth daily. 12/23/22     lamoTRIgine (LAMICTAL) 200 MG tablet Take 1 tablet (200 mg total) by mouth daily. 02/17/23     lamoTRIgine (LAMICTAL) 200 MG tablet Take 1 tablet (200 mg total) by mouth daily. 05/12/23     LORazepam (ATIVAN) 0.5 MG tablet Take 1 tablet (0.5 mg total) by mouth daily as needed for severe anxiety (must last 30 days per md) 04/18/21     LORazepam (ATIVAN) 0.5 MG tablet Take 1 tablet by mouth daily as needed for severe anxiety. 08/05/22     LORazepam (ATIVAN) 0.5 MG tablet Take 1 tablet (0.5 mg total) by mouth daily as needed for severe anxiety (this is a one month supply) 09/02/22     LORazepam (ATIVAN) 0.5 MG tablet Take 1 tablet (0.5 mg total) by mouth daily as needed for severe anxiety (30 day supply per md) 09/29/22     LORazepam (ATIVAN) 0.5 MG tablet Take 1 tablet (0.5 mg total) by mouth daily as needed. 04/14/23     LORazepam (ATIVAN) 0.5 MG tablet Take 1 tablet (0.5 mg total) by mouth daily as needed for severe anxiety. (this is a 1 month supply) 05/12/23     pantoprazole (PROTONIX) 40 MG tablet Take 1 tablet by mouth 2 times a day 06/10/22     pantoprazole (PROTONIX) 40 MG tablet Take 1 tablet by mouth  2 times a day 06/10/22     pantoprazole (PROTONIX) 40 MG tablet Take 1 tablet (40 mg total) by mouth 2 (two) times daily. 09/02/22     pantoprazole (PROTONIX) 40 MG tablet Take 1 tablet (40 mg total) by mouth 2 (two) times daily. 12/23/22     prazosin (MINIPRESS) 2 MG capsule Take 1 capsule (2 mg total) by mouth at bedtime. 10/27/22     prazosin (MINIPRESS) 2 MG capsule Take 1 capsule (2 mg total) by mouth at bedtime. 10/27/22     prazosin (MINIPRESS) 2 MG capsule Take 1 capsule (2 mg total) by mouth at bedtime. 11/25/22     prazosin (MINIPRESS) 2 MG capsule Take 1 capsule (2 mg total) by mouth at bedtime. 12/23/22     prazosin (MINIPRESS) 2 MG capsule Take 1  capsule by mouth at bedtime. 01/20/23     prazosin (MINIPRESS) 2 MG capsule Take 1 capsule (2 mg total) by mouth at bedtime. 02/17/23     prazosin (MINIPRESS) 2 MG capsule Take 1 capsule (2 mg total) by mouth at bedtime. 05/12/23     QUEtiapine (SEROQUEL) 25 MG tablet Take 1 tablet (25 mg total) by mouth daily as needed for insomnia. 05/12/23     QUEtiapine (SEROQUEL) 50 MG tablet Take 1 tablet by mouth at bedtime as needed  for insomnia 09/05/21     QUEtiapine (SEROQUEL) 50 MG  tablet Take 1 tablet by mouth at bedtime as needed for insomnia 05/15/22     QUEtiapine (SEROQUEL) 50 MG tablet Take 1 tablet by mouth at bedtime for insomnia 06/10/22     QUEtiapine (SEROQUEL) 50 MG tablet Take 1 tablet by mouth bedtime as needed for insomina 06/10/22     QUEtiapine (SEROQUEL) 50 MG tablet Take 1 tablet (50 mg total) by mouth at bedtime as needed for insomnia 09/02/22      Current Facility-Administered Medications  Medication Dose Route Frequency Provider Last Rate Last Admin   0.9 %  sodium chloride infusion (Manually program via Guardrails IV Fluids)   Intravenous Once Kommor, [place], MD       acetaminophen (TYLENOL) tablet 650 mg  650 mg Oral Q6H PRN Kirby Crigler, Mir M, MD       Or   acetaminophen (TYLENOL) suppository 650 mg  650 mg Rectal Q6H PRN Kirby Crigler, Mir M, MD       albuterol (PROVENTIL) (2.5 MG/3ML) 0.083% nebulizer solution 2.5 mg  2.5 mg Nebulization Q2H PRN Kirby Crigler, Mir M, MD       buprenorphine (SUBUTEX) sublingual tablet 8 mg  8 mg Sublingual TID Kirby Crigler, Mir M, MD       lamoTRIgine (LAMICTAL) tablet 200 mg  200 mg Oral Daily Kirby Crigler, Mir M, MD       LORazepam (ATIVAN) tablet 0.5 mg  0.5 mg Oral Q4H PRN Kirby Crigler, Mir M, MD       metoprolol tartrate (LOPRESSOR) injection 5 mg  5 mg Intravenous Q6H PRN Kirby Crigler, Mir M, MD       ondansetron [facility]) tablet 4 mg  4 mg Oral Q6H PRN Kirby Crigler, Mir M, MD       Or   ondansetron (ZOFRAN) injection 4 mg  4 mg Intravenous Q6H PRN Kirby Crigler,  Mir M, MD       polyethylene glycol (MIRALAX / GLYCOLAX) packet 17 g  17 g Oral Daily PRN Kirby Crigler, Mir M, MD       QUEtiapine (SEROQUEL) tablet 25 mg  25 mg Oral QHS PRN Kirby Crigler, Mir M, MD       traZODone (DESYREL) tablet 25 mg  25 mg Oral QHS PRN Kirby Crigler, Mir M, MD       Current Outpatient Medications  Medication Sig Dispense Refill   acetaminophen (TYLENOL) 500 MG tablet Take 1,500 mg by mouth every 6 (six) hours as needed for moderate pain.     buprenorphine (SUBUTEX) 8 MG SUBL SL tablet Place 1 tablet (8 mg total) under the tongue 3 (three) times daily. 90 tablet 0   busPIRone (BUSPAR) 30 MG tablet Take 1 tablet by mouth 2 times a day (take with food) 180 tablet 0   busPIRone (BUSPAR) 30 MG tablet Take 1 tablet (30 mg total) by mouth 2 (two) times daily with food 180 tablet 0   busPIRone (BUSPAR) 30 MG tablet Take 1 tablet (30 mg total) by mouth 2 (two) times daily with a meal. 180 tablet 0   busPIRone (BUSPAR) 30 MG tablet Take 1 tablet (30 mg total) by mouth 2 (two) times daily with food 180 tablet 0   busPIRone (BUSPAR) 30 MG tablet Take 1 tablet orally 2 times a day (take with food) 180 tablet 0   escitalopram (LEXAPRO) 10 MG tablet Take 1 tablet by mouth daily 90 tablet 0   escitalopram (LEXAPRO) 10 MG tablet Take 1 tablet by mouth daily 90 tablet 0   lamoTRIgine (LAMICTAL) 200 MG tablet Take 1  tablet by mouth daily 90 tablet 0   lamoTRIgine (LAMICTAL) 200 MG tablet Take 1 tablet (200 mg total) by mouth daily. 90 tablet 0   lamoTRIgine (LAMICTAL) 200 MG tablet Take 1 tablet (200 mg total) by mouth daily. 90 tablet 0   lamoTRIgine (LAMICTAL) 200 MG tablet Take 1 tablet (200 mg total) by mouth daily. 90 tablet 0   lamoTRIgine (LAMICTAL) 200 MG tablet Take 1 tablet (200 mg total) by mouth daily. 90 tablet 0   LORazepam (ATIVAN) 0.5 MG tablet Take 1 tablet (0.5 mg total) by mouth daily as needed for severe anxiety (must last 30 days per md) 10 tablet 0   LORazepam (ATIVAN) 0.5 MG  tablet Take 1 tablet by mouth daily as needed for severe anxiety. 15 tablet 0   LORazepam (ATIVAN) 0.5 MG tablet Take 1 tablet (0.5 mg total) by mouth daily as needed for severe anxiety (this is a one month supply) 15 tablet 0   LORazepam (ATIVAN) 0.5 MG tablet Take 1 tablet (0.5 mg total) by mouth daily as needed for severe anxiety (30 day supply per md) 15 tablet 0   LORazepam (ATIVAN) 0.5 MG tablet Take 1 tablet (0.5 mg total) by mouth daily as needed. 15 tablet 0   LORazepam (ATIVAN) 0.5 MG tablet Take 1 tablet (0.5 mg total) by mouth daily as needed for severe anxiety. (this is a 1 month supply) 15 tablet 0   pantoprazole (PROTONIX) 40 MG tablet Take 1 tablet by mouth 2 times a day 180 tablet 0   pantoprazole (PROTONIX) 40 MG tablet Take 1 tablet by mouth  2 times a day 180 tablet 0   pantoprazole (PROTONIX) 40 MG tablet Take 1 tablet (40 mg total) by mouth 2 (two) times daily. 180 tablet 0   pantoprazole (PROTONIX) 40 MG tablet Take 1 tablet (40 mg total) by mouth 2 (two) times daily. 180 tablet 0   prazosin (MINIPRESS) 2 MG capsule Take 1 capsule (2 mg total) by mouth at bedtime. 30 capsule 0   prazosin (MINIPRESS) 2 MG capsule Take 1 capsule (2 mg total) by mouth at bedtime. 30 capsule 0   prazosin (MINIPRESS) 2 MG capsule Take 1 capsule (2 mg total) by mouth at bedtime. 30 capsule 0   prazosin (MINIPRESS) 2 MG capsule Take 1 capsule (2 mg total) by mouth at bedtime. 30 capsule 0   prazosin (MINIPRESS) 2 MG capsule Take 1 capsule by mouth at bedtime. 30 capsule 0   prazosin (MINIPRESS) 2 MG capsule Take 1 capsule (2 mg total) by mouth at bedtime. 30 capsule 0   prazosin (MINIPRESS) 2 MG capsule Take 1 capsule (2 mg total) by mouth at bedtime. 30 capsule 0   QUEtiapine (SEROQUEL) 25 MG tablet Take 1 tablet (25 mg total) by mouth daily as needed for insomnia. 30 tablet 0   QUEtiapine (SEROQUEL) 50 MG tablet Take 1 tablet by mouth at bedtime as needed  for insomnia 30 tablet 0   QUEtiapine  (SEROQUEL) 50 MG tablet Take 1 tablet by mouth at bedtime as needed for insomnia 30 tablet 0   QUEtiapine (SEROQUEL) 50 MG tablet Take 1 tablet by mouth at bedtime for insomnia 90 tablet 0   QUEtiapine (SEROQUEL) 50 MG tablet Take 1 tablet by mouth bedtime as needed for insomina 90 tablet 0   QUEtiapine (SEROQUEL) 50 MG tablet Take 1 tablet (50 mg total) by mouth at bedtime as needed for insomnia 90 tablet 0   Allergies as of  05/20/2023 - Review Complete 05/20/2023  Allergen Reaction Noted   Tramadol Other (See Comments)    Nsaids  09/19/2018   Family History  Problem Relation Age of Onset   Heart disease Other    Arthritis Other    Anesthesia problems Neg Hx    Hypotension Neg Hx    Malignant hyperthermia Neg Hx    Pseudochol deficiency Neg Hx    Social History   Socioeconomic History   Marital status: Single    Spouse name: Not on file   Number of children: Not on file   Years of education: college   Highest education level: Not on file  Occupational History   Occupation: none    Employer: SELF EMPLOYED  Tobacco Use   Smoking status: Former    Packs/day: 0.50    Years: 7.00    Additional pack years: 0.00    Total pack years: 3.50    Types: E-cigarettes, Cigarettes   Smokeless tobacco: Never  Vaping Use   Vaping Use: Every day  Substance and Sexual Activity   Alcohol use: No   Drug use: Not Currently    Types: Marijuana   Sexual activity: Not on file  Other Topics Concern   Not on file  Social History Narrative   Not on file   Social Determinants of Health   Financial Resource Strain: Not on file  Food Insecurity: Not on file  Transportation Needs: Not on file  Physical Activity: Not on file  Stress: Not on file  Social Connections: Not on file  Intimate Partner Violence: Not on file   Review of Systems:  Review of Systems  Constitutional:  Positive for malaise/fatigue.  Respiratory:  Positive for shortness of breath.   Cardiovascular:  Negative for  chest pain.  Gastrointestinal:  Positive for abdominal pain, blood in stool, melena, nausea and vomiting.  Neurological:  Positive for dizziness.    OBJECTIVE:   Temp:  [97.7 F (36.5 C)] 97.7 F (36.5 C) (06/05 1044) Pulse Rate:  [85-152] 85 (06/05 1315) Resp:  [17-18] 17 (06/05 1315) BP: (116-148)/(63-78) 116/63 (06/05 1315) SpO2:  [99 %-100 %] 99 % (06/05 1315)   Physical Exam Constitutional:      General: He is not in acute distress.    Appearance: He is not ill-appearing, toxic-appearing or diaphoretic.  Cardiovascular:     Rate and Rhythm: Regular rhythm. Tachycardia present.  Pulmonary:     Effort: No respiratory distress.     Breath sounds: Normal breath sounds.  Abdominal:     General: Bowel sounds are normal. There is no distension.     Palpations: Abdomen is soft.     Tenderness: There is abdominal tenderness (epigastric). There is no guarding.  Musculoskeletal:     Right lower leg: No edema.     Left lower leg: No edema.  Skin:    General: Skin is warm and dry.     Coloration: Skin is pale.  Neurological:     Mental Status: He is alert.     Labs: Recent Labs    05/20/23 1137  WBC 19.3*  HGB 4.5*  HCT 16.8*  PLT 421*   BMET Recent Labs    05/20/23 1137  NA 135  K 4.1  CL 100  CO2 20*  GLUCOSE 119*  BUN 30*  CREATININE 1.06  CALCIUM 8.9   LFT Recent Labs    05/20/23 1137  PROT 8.1  ALBUMIN 4.0  AST 19  ALT 12  ALKPHOS 40  BILITOT 0.4   PT/INR No results for input(s): "LABPROT", "INR" in the last 72 hours.  Diagnostic imaging: No results found.  IMPRESSION: Melena Acute blood loss anemia History duodenal ulcer on EGD 04/2022, EGD 02/2019 (required IR embolization of GDA) Gastroesophageal reflux disease Hypertension  PLAN: -2 large bore IV, transfuse for Hgb < 7 -IV PPI Q12Hr -Recommend EGD to further evaluate melena, anemia. Discussed benefits, alternatives, risks including bleeding/infection/perforation/missed  lesion/anesthesia, patient verbalized understanding and elected to proceed -Plan for EGD 05/21/23 pending Hgb (needs to be greater than 7) -Maintenance IV fluids -Check iron studies -Avoidance of all NSAIDs -Ok for soft diet today, NPO at midnight for EGD 05/21/23 -Further recommendations to follow pending endoscopy   LOS: 0 days   Liliane Shi, [facility] Gastroenterology

## 2023-05-20 NOTE — ED Notes (Signed)
Doctor at beside with patient. Was informed of labs

## 2023-05-20 NOTE — H&P (Signed)
History and Physical  HAMPTON RHEW ZOX:[id number] DOB: 06-07-1981 DOA: 05/20/2023  PCP: Vertis Kelch, NP   Chief Complaint: Coffee-ground emesis, syncope  HPI: Alan Jennings is a 42 y.o. male with medical history significant for peptic ulcer disease, seizures, depression being admitted to the hospital with suspected upper GI bleed presenting with several days of abdominal pain, coffee-ground emesis.  Patient states he was airlifted to [facility] back in 2020 due to bleeding peptic ulcer and has been on p.o. PPI since that time.  More recently, he was having abdominal pain and vomiting, had upper endoscopy last year at [facility].  Continues to take omeprazole daily, states that for the last year or so he has had intermittent abdominal discomfort, intermittent dark stools.  However starting about 4 days ago, he has been having severe epigastric abdominal pain radiating to his back.  There has been associated melanotic stools, as well as coffee-ground emesis.  He has not seen any bright red blood in his stool, or in his emesis.  This morning, he started passing out every time he tried to ambulate.  ED Course: Initial vitals demonstrated tachycardia pulse 152 blood pressure 148/78.  He has since then been hemodynamically stable with normal vital signs.  Lab work was done shows WBC 19,000, hemoglobin 4.5, platelets 421.  BMP as well as LFTs unremarkable.  Orders have been placed for 4 unit blood transfusion, GI has been consulted by EDP.  He was also given IV Protonix.  Hospitalist was contacted for admission.  Review of Systems: Please see HPI for pertinent positives and negatives. A complete 10 system review of systems are otherwise negative.  Past Medical History:  Diagnosis Date   Abdominal pain, chronic, epigastric    Anxiety    Arthritis    Back pain    Complication of anesthesia    had a syncopal episode after rhinoplasty   Depression    GERD (gastroesophageal reflux disease)    GI  bleed    Headache    History of kidney stones    Hypertension    Neurogenic pain    Peptic ulcer disease    PTSD (post-traumatic stress disorder)    Seizures (HCC)    Seizures (HCC)    Past Surgical History:  Procedure Laterality Date   BIOPSY  06/25/2018   Procedure: BIOPSY;  Surgeon: Malissa Hippo, MD;  [facility]: [facility];  Service: Endoscopy;;  gastric   ESOPHAGOGASTRODUODENOSCOPY (EGD) WITH PROPOFOL N/A 06/25/2018   Procedure: ESOPHAGOGASTRODUODENOSCOPY (EGD) WITH PROPOFOL;  Surgeon: Malissa Hippo, MD;  Location: [facility];  Service: Endoscopy;  Laterality: N/A;   ESOPHAGOGASTRODUODENOSCOPY (EGD) WITH PROPOFOL N/A 05/13/2022   Procedure: ESOPHAGOGASTRODUODENOSCOPY (EGD) WITH PROPOFOL;  Surgeon: Dolores Frame, MD;  Location: [facility];  Service: Gastroenterology;  Laterality: N/A;   NOSE SURGERY     [facility]   ORIF HUMERUS FRACTURE  04/26/2012   Procedure: OPEN REDUCTION INTERNAL FIXATION (ORIF) PROXIMAL HUMERUS FRACTURE;  Surgeon: Vickki Hearing, MD;  Location: [facility] ORS;  Service: Orthopedics;  Laterality: Left;  Open Reduction Internal Fixation of Left Proximal Humerus Fracture, Closing Wedge Osteotomy, Bone Graft   RHINOPLASTY     [facility]   SHOULDER SURGERY      Social History:  reports that he has quit smoking. His smoking use included e-cigarettes. He has a 3.50 pack-year smoking history. He has never used smokeless tobacco. He reports that he does not currently use drugs after having used the following drugs:  Marijuana. He reports that he does not drink alcohol.   Allergies  Allergen Reactions   Tramadol Other (See Comments)    SEIZURES   Nsaids     Has caused ulcers and needs to avoid.    Family History  Problem Relation Age of Onset   Heart disease Other    Arthritis Other    Anesthesia problems Neg Hx    Hypotension Neg Hx    Malignant hyperthermia Neg Hx    Pseudochol deficiency Neg Hx      Prior to Admission medications   Medication  Sig Start Date End Date Taking? Authorizing Provider  acetaminophen (TYLENOL) 500 MG tablet Take 1,500 mg by mouth every 6 (six) hours as needed for moderate pain.    [provider]  buprenorphine (SUBUTEX) 8 MG SUBL SL tablet Place 1 tablet (8 mg total) under the tongue 3 (three) times daily. 05/12/23     busPIRone (BUSPAR) 30 MG tablet Take 1 tablet by mouth 2 times a day (take with food) 06/10/22     busPIRone (BUSPAR) 30 MG tablet Take 1 tablet (30 mg total) by mouth 2 (two) times daily with food 09/02/22     busPIRone (BUSPAR) 30 MG tablet Take 1 tablet (30 mg total) by mouth 2 (two) times daily with a meal. 12/23/22     busPIRone (BUSPAR) 30 MG tablet Take 1 tablet (30 mg total) by mouth 2 (two) times daily with food 02/17/23     busPIRone (BUSPAR) 30 MG tablet Take 1 tablet orally 2 times a day (take with food) 05/12/23     escitalopram (LEXAPRO) 10 MG tablet Take 1 tablet by mouth daily 06/10/22     escitalopram (LEXAPRO) 10 MG tablet Take 1 tablet by mouth daily 06/10/22     lamoTRIgine (LAMICTAL) 200 MG tablet Take 1 tablet by mouth daily 06/10/22     lamoTRIgine (LAMICTAL) 200 MG tablet Take 1 tablet (200 mg total) by mouth daily. 09/02/22     lamoTRIgine (LAMICTAL) 200 MG tablet Take 1 tablet (200 mg total) by mouth daily. 12/23/22     lamoTRIgine (LAMICTAL) 200 MG tablet Take 1 tablet (200 mg total) by mouth daily. 02/17/23     lamoTRIgine (LAMICTAL) 200 MG tablet Take 1 tablet (200 mg total) by mouth daily. 05/12/23     LORazepam (ATIVAN) 0.5 MG tablet Take 1 tablet (0.5 mg total) by mouth daily as needed for severe anxiety (must last 30 days per md) 04/18/21     LORazepam (ATIVAN) 0.5 MG tablet Take 1 tablet by mouth daily as needed for severe anxiety. 08/05/22     LORazepam (ATIVAN) 0.5 MG tablet Take 1 tablet (0.5 mg total) by mouth daily as needed for severe anxiety (this is a one month supply) 09/02/22     LORazepam (ATIVAN) 0.5 MG tablet Take 1 tablet (0.5 mg total) by mouth daily as  needed for severe anxiety (30 day supply per md) 09/29/22     LORazepam (ATIVAN) 0.5 MG tablet Take 1 tablet (0.5 mg total) by mouth daily as needed. 04/14/23     LORazepam (ATIVAN) 0.5 MG tablet Take 1 tablet (0.5 mg total) by mouth daily as needed for severe anxiety. (this is a 1 month supply) 05/12/23     pantoprazole (PROTONIX) 40 MG tablet Take 1 tablet by mouth 2 times a day 06/10/22     pantoprazole (PROTONIX) 40 MG tablet Take 1 tablet by mouth  2 times a day 06/10/22  pantoprazole (PROTONIX) 40 MG tablet Take 1 tablet (40 mg total) by mouth 2 (two) times daily. 09/02/22     pantoprazole (PROTONIX) 40 MG tablet Take 1 tablet (40 mg total) by mouth 2 (two) times daily. 12/23/22     prazosin (MINIPRESS) 2 MG capsule Take 1 capsule (2 mg total) by mouth at bedtime. 10/27/22     prazosin (MINIPRESS) 2 MG capsule Take 1 capsule (2 mg total) by mouth at bedtime. 10/27/22     prazosin (MINIPRESS) 2 MG capsule Take 1 capsule (2 mg total) by mouth at bedtime. 11/25/22     prazosin (MINIPRESS) 2 MG capsule Take 1 capsule (2 mg total) by mouth at bedtime. 12/23/22     prazosin (MINIPRESS) 2 MG capsule Take 1 capsule by mouth at bedtime. 01/20/23     prazosin (MINIPRESS) 2 MG capsule Take 1 capsule (2 mg total) by mouth at bedtime. 02/17/23     prazosin (MINIPRESS) 2 MG capsule Take 1 capsule (2 mg total) by mouth at bedtime. 05/12/23     QUEtiapine (SEROQUEL) 25 MG tablet Take 1 tablet (25 mg total) by mouth daily as needed for insomnia. 05/12/23     QUEtiapine (SEROQUEL) 50 MG tablet Take 1 tablet by mouth at bedtime as needed  for insomnia 09/05/21     QUEtiapine (SEROQUEL) 50 MG tablet Take 1 tablet by mouth at bedtime as needed for insomnia 05/15/22     QUEtiapine (SEROQUEL) 50 MG tablet Take 1 tablet by mouth at bedtime for insomnia 06/10/22     QUEtiapine (SEROQUEL) 50 MG tablet Take 1 tablet by mouth bedtime as needed for insomina 06/10/22     QUEtiapine (SEROQUEL) 50 MG tablet Take 1 tablet (50 mg total) by  mouth at bedtime as needed for insomnia 09/02/22       Physical Exam: BP 116/63   Pulse 85   Temp 97.7 F (36.5 C) (Oral)   Resp 17   SpO2 99%   General:  Alert, oriented, calm, in no acute distress, very pale but well-developed well-nourished gentleman resting on a stretcher in the ER Eyes: EOMI, clear conjuctivae, white sclerea Neck: supple, no masses, trachea mildline  Cardiovascular: RRR, no murmurs or rubs, no peripheral edema  Respiratory: clear to auscultation bilaterally, no wheezes, no crackles  Abdomen: soft, nontender, nondistended, normal bowel tones heard  Skin: dry, no rashes  Musculoskeletal: no joint effusions, normal range of motion  Psychiatric: appropriate affect, normal speech  Neurologic: extraocular muscles intact, clear speech, moving all extremities with intact sensorium          Labs on Admission:  Basic Metabolic Panel: Recent Labs  Lab 05/20/23 1137  NA 135  K 4.1  CL 100  CO2 20*  GLUCOSE 119*  BUN 30*  CREATININE 1.06  CALCIUM 8.9   Liver Function Tests: Recent Labs  Lab 05/20/23 1137  AST 19  ALT 12  ALKPHOS 40  BILITOT 0.4  PROT 8.1  ALBUMIN 4.0   No results for input(s): "LIPASE", "AMYLASE" in the last 168 hours. No results for input(s): "AMMONIA" in the last 168 hours. CBC: Recent Labs  Lab 05/20/23 1137  WBC 19.3*  NEUTROABS 17.7*  HGB 4.5*  HCT 16.8*  MCV 72.4*  PLT 421*   Cardiac Enzymes: No results for input(s): "CKTOTAL", "CKMB", "CKMBINDEX", "TROPONINI" in the last 168 hours.  BNP (last 3 results) No results for input(s): "BNP" in the last 8760 hours.  ProBNP (last 3 results) No results for input(s): "PROBNP"  in the last 8760 hours.  CBG: No results for input(s): "GLUCAP" in the last 168 hours.  Radiological Exams on Admission: No results found.  Assessment/Plan 42 year old male with a history of peptic ulcer disease being admitted to the hospital with several days of abdominal pain, melena,  coffee-ground emesis found to have severe anemia hemoglobin 4.5.  Chart review reveals that he had gastric hemorrhage from the left gastric artery requiring coiling procedure with IR at [facility] many years ago, as well as upper endoscopy 4 years ago showing gastric erosions and bleeding.  He had EGD at [facility] 05/13/2022 which showed large amount of retained food in the gastric fundus, 1 nonbleeding cratered duodenal ulcer with reported stricture.  Patient left that hospital stay AMA before repeat endoscopy could be performed.  Upper GI bleed-hemodynamically stable, with no evidence of active bleeding -Inpatient admission -Telemetry monitoring -IV PPI twice daily -Transfuse 4 units, ordered by EDP -Follow hemoglobin to 8 hours -Keep n.p.o., with medication and ice chips -Anticipate inpatient GI consultation  Anxiety and depression-continue as needed Ativan, and Seroquel as needed at bedtime  PTSD and chronic pain-continue home Suboxone, Lamictal, Ativan and Seroquel as needed as above  Epilepsy-continue home Lamictal  DVT prophylaxis: SCDs     Code Status: Full Code  Consults called: GI Dr. Lorenso Quarry  Admission status: The appropriate patient status for this patient is INPATIENT. Inpatient status is judged to be reasonable and necessary in order to provide the required intensity of service to ensure the patient's safety. The patient's presenting symptoms, physical exam findings, and initial radiographic and laboratory data in the context of their chronic comorbidities is felt to place them at high risk for further clinical deterioration. Furthermore, it is not anticipated that the patient will be medically stable for discharge from the hospital within 2 midnights of admission.    I certify that at the point of admission it is my clinical judgment that the patient will require inpatient hospital care spanning beyond 2 midnights from the point of admission due to high intensity of  service, high risk for further deterioration and high frequency of surveillance required  Time spent: 56 minutes  Eriel Dunckel Sharlette Dense MD [facility] Pager [phone]  If 7PM-7AM, please contact night-coverage [email or web address] Password [email or web address]  05/20/2023, 1:35 PM

## 2023-05-20 NOTE — ED Triage Notes (Signed)
Pt pulled from car after syncopal episode.  Pt reports hx of bleeding ulcers and surgery in the past.  Pt has had black stools and black emesis x 2 days with swelling to feet and multiple syncopal episodes.

## 2023-05-20 NOTE — Anesthesia Preprocedure Evaluation (Signed)
Anesthesia Evaluation  Patient identified by MRN, date of birth, ID band Patient awake    Reviewed: Allergy & Precautions, NPO status , Patient's Chart, lab work & pertinent test results  Airway Mallampati: II  TM Distance: >3 FB Neck ROM: Full    Dental no notable dental hx. (+) Missing   Pulmonary Patient abstained from smoking., former smoker   Pulmonary exam normal breath sounds clear to auscultation       Cardiovascular hypertension, Normal cardiovascular exam Rhythm:Regular Rate:Normal     Neuro/Psych Seizures -, Well Controlled,  PSYCHIATRIC DISORDERS Anxiety Depression    PTSD   GI/Hepatic PUD,GERD  ,,  Endo/Other    Renal/GU      Musculoskeletal  (+) Arthritis ,  [place] bACK PAIN   Abdominal   Peds  Hematology  (+) Blood dyscrasia, anemia Lab Results      Component                Value               Date                      WBC                      19.3 (H)            05/20/2023                HGB                      4.5 (LL)            05/20/2023                HCT                      16.8 (L)            05/20/2023                MCV                      72.4 (L)            05/20/2023                PLT                      421 (H)             05/20/2023              Anesthesia Other Findings All: Tramadol, NSAIDS   Reproductive/Obstetrics                             Anesthesia Physical Anesthesia Plan  ASA: 4  Anesthesia Plan: MAC   Post-op Pain Management: Minimal or no pain anticipated and Precedex   Induction:   PONV Risk Score and Plan: Treatment may vary due to age or medical condition and Propofol infusion  Airway Management Planned:   Additional Equipment: None  Intra-op Plan:   Post-operative Plan:   Informed Consent: I have reviewed the patients History and Physical, chart, labs and discussed the procedure including the risks, benefits and  alternatives for the proposed anesthesia with the patient or authorized representative who has indicated his/her understanding and acceptance.  Dental advisory given  Plan Discussed with:   Anesthesia Plan Comments: (EGD  for anemia melena and Hx of duodenal ulcer)        Anesthesia Quick Evaluation

## 2023-05-20 NOTE — ED Notes (Signed)
ED TO INPATIENT HANDOFF REPORT  Name/Age/Gender Alan Jennings 42 y.o. male  Code Status    Code Status Orders  (From admission, onward)           Start     Ordered   05/20/23 1326  Full code  Continuous       Question:  By:  Answer:  Consent: discussion documented in EHR   05/20/23 1327           Code Status History     Date Active Date Inactive Code Status Order ID Comments User Context   05/12/2022 1125 05/14/2022 1342 Full Code [id number]  Kendell Bane, MD ED   04/26/2012 1323 04/27/2012 1414 Full Code [id number]  Sharyn Creamer, RN Inpatient       Home/SNF/Other Home  Chief Complaint Upper GI bleed [K92.2]  Level of Care/Admitting Diagnosis ED Disposition     ED Disposition  Admit   Condition  --   Comment  Hospital Area: [facility] [[id number]]  Level of Care: Progressive [102]  Admit to Progressive based on following criteria: GI, ENDOCRINE disease patients with GI bleeding, acute liver failure or pancreatitis, stable with diabetic ketoacidosis or thyrotoxicosis (hypothyroid) state.  May admit patient to [facility] or [facility] if equivalent level of care is available:: Yes  Covid Evaluation: Asymptomatic - no recent exposure (last 10 days) testing not required  Diagnosis: Upper GI bleed [267195]  Admitting Physician: Maryln Gottron [[id number]]  Attending Physician: Kirby Crigler, MIR [email or web address] [[id number]]  Certification:: I certify this patient will need inpatient services for at least 2 midnights  Estimated Length of Stay: 3          Medical History Past Medical History:  Diagnosis Date   Abdominal pain, chronic, epigastric    Anxiety    Arthritis    Back pain    Complication of anesthesia    had a syncopal episode after rhinoplasty   Depression    GERD (gastroesophageal reflux disease)    GI bleed    Headache    History of kidney stones    Hypertension    Neurogenic pain    Peptic ulcer disease    PTSD (post-traumatic  stress disorder)    Seizures (HCC)    Seizures (HCC)     Allergies Allergies  Allergen Reactions   Tramadol Other (See Comments)    SEIZURES   Nsaids     Has caused ulcers and needs to avoid.    IV Location/Drains/Wounds Patient Lines/Drains/Airways Status     Active Line/Drains/Airways     Name Placement date Placement time Site Days   Peripheral IV 05/20/23 20 G Anterior;Left Forearm 05/20/23  1211  Forearm  less than 1            Labs/Imaging Results for orders placed or performed during the hospital encounter of 05/20/23 (from the past 48 hour(s))  Comprehensive metabolic panel     Status: Abnormal   Collection Time: 05/20/23 11:37 AM  Result Value Ref Range   Sodium 135 135 - 145 mmol/L   Potassium 4.1 3.5 - 5.1 mmol/L   Chloride 100 98 - 111 mmol/L   CO2 20 (L) 22 - 32 mmol/L   Glucose, Bld 119 (H) 70 - 99 mg/dL    Comment: Glucose reference range applies only to samples taken after fasting for at least 8 hours.   BUN 30 (H) 6 - 20 mg/dL   Creatinine, Ser 7.84 0.61 - 1.24 mg/dL  Calcium 8.9 8.9 - 10.3 mg/dL   Total Protein 8.1 6.5 - 8.1 g/dL   Albumin 4.0 3.5 - 5.0 g/dL   AST 19 15 - 41 U/L   ALT 12 0 - 44 U/L   Alkaline Phosphatase 40 38 - 126 U/L   Total Bilirubin 0.4 0.3 - 1.2 mg/dL   GFR, Estimated >16 >10 mL/min    Comment: (NOTE) Calculated using the CKD-EPI Creatinine Equation (2021)    Anion gap 15 5 - 15    Comment: Performed at [facility], [address] [place], [place] [address]  CBC with Differential     Status: Abnormal   Collection Time: 05/20/23 11:37 AM  Result Value Ref Range   WBC 19.3 (H) 4.0 - 10.5 K/uL   RBC 2.32 (L) 4.22 - 5.81 MIL/uL   Hemoglobin 4.5 (LL) 13.0 - 17.0 g/dL    Comment: REPEATED TO VERIFY Reticulocyte Hemoglobin testing may be clinically indicated, consider ordering this additional test [id number] THIS CRITICAL RESULT HAS VERIFIED AND BEEN CALLED TO CORTEZ,E RN BY GOLSON,M ON 06 05 2024  AT 1206, AND HAS BEEN READ BACK.     HCT 16.8 (L) 39.0 - 52.0 %   MCV 72.4 (L) 80.0 - 100.0 fL   MCH 19.4 (L) 26.0 - 34.0 pg   MCHC 26.8 (L) 30.0 - 36.0 g/dL   RDW 47.8 (H) 29.5 - 62.1 %   Platelets 421 (H) 150 - 400 K/uL   nRBC 0.0 0.0 - 0.2 %   Neutrophils Relative % 92 %   Neutro Abs 17.7 (H) 1.7 - 7.7 K/uL   Lymphocytes Relative 5 %   Lymphs Abs 1.1 0.7 - 4.0 K/uL   Monocytes Relative 2 %   Monocytes Absolute 0.4 0.1 - 1.0 K/uL   Eosinophils Relative 0 %   Eosinophils Absolute 0.0 0.0 - 0.5 K/uL   Basophils Relative 0 %   Basophils Absolute 0.0 0.0 - 0.1 K/uL   Immature Granulocytes 1 %   Abs Immature Granulocytes 0.13 (H) 0.00 - 0.07 K/uL    Comment: Performed at [facility], [address], [place], [place] [address]   No results found.  Pending Labs Unresulted Labs (From admission, onward)     Start     Ordered   05/21/23 0500  Basic metabolic panel  Tomorrow morning,   R        05/20/23 1327   05/21/23 0500  CBC  Tomorrow morning,   R        05/20/23 1327   05/20/23 1327  Hemoglobin and hematocrit, blood  Now then every 8 hours,   R (with TIMED occurrences)      05/20/23 1327   05/20/23 1326  HIV Antibody (routine testing w rflx)  (HIV Antibody (Routine testing w reflex) panel)  Once,   R        05/20/23 1327   05/20/23 1209  Prepare RBC (crossmatch)  (Blood Administration Adult)  Once,   R       Question Answer Comment  # of Units 4 units   Transfusion Indications Hemoglobin < 7 gm/dL and symptomatic   Number of Units to Keep Ahead NO units ahead   If emergent release call blood bank [facility] [phone]      05/20/23 1209   05/20/23 1048  Type and screen [facility]  Once,   STAT       Comments: [facility]  05/20/23 1047            Vitals/Pain Today's Vitals   05/20/23 1044 05/20/23 1130 05/20/23 1315  BP: (!) 148/78 128/71 116/63  Pulse: (!) 152 98 85  Resp: 18 18 17   Temp: 97.7  F (36.5 C)    TempSrc: Oral    SpO2: 100% 100% 99%  PainSc: 0-No pain      Isolation Precautions No active isolations  Medications Medications  0.9 %  sodium chloride infusion (Manually program via Guardrails IV Fluids) (has no administration in time range)  buprenorphine (SUBUTEX) sublingual tablet 8 mg (has no administration in time range)  acetaminophen (TYLENOL) tablet 650 mg (has no administration in time range)    Or  acetaminophen (TYLENOL) suppository 650 mg (has no administration in time range)  traZODone (DESYREL) tablet 25 mg (has no administration in time range)  polyethylene glycol (MIRALAX / GLYCOLAX) packet 17 g (has no administration in time range)  ondansetron (ZOFRAN) tablet 4 mg (has no administration in time range)    Or  ondansetron (ZOFRAN) injection 4 mg (has no administration in time range)  albuterol (PROVENTIL) (2.5 MG/3ML) 0.083% nebulizer solution 2.5 mg (has no administration in time range)  metoprolol tartrate (LOPRESSOR) injection 5 mg (has no administration in time range)  LORazepam (ATIVAN) tablet 0.5 mg (has no administration in time range)  lamoTRIgine (LAMICTAL) tablet 200 mg (has no administration in time range)  QUEtiapine (SEROQUEL) tablet 25 mg (has no administration in time range)  lactated ringers bolus 1,000 mL (0 mLs Intravenous Stopped 05/20/23 1344)  pantoprazole (PROTONIX) injection 40 mg (40 mg Intravenous Given 05/20/23 1223)    Mobility walks

## 2023-05-20 NOTE — Plan of Care (Signed)

## 2023-05-20 NOTE — H&P (View-Only) (Signed)
[facility] Consult  Referring Provider: No ref. provider found Primary Care Physician:  Allen, Debra, NP Primary Gastroenterologist: Unassigned  Reason for Consultation: coffee ground emesis, anemia  SUBJECTIVE:   HPI: Alan Jennings is a 41 y.o. male with past medical history significant for duodenal ulcer and duodenal stricture, gastroesophageal reflux disease, hypertension and seizures. Presented to hospital for 1 week history of coffee ground emesis, melena. He noted that he has been experiencing alternating melena over the past 1 year. He takes over the counter omeprazole 20 mg PO daily. He has avoided all NSAID products, he used to take powdered aspirin product. He has been experiencing shortness of breath. He has been pale. 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   Labs on presentation showed Hgb 4.5, WBC 19.3, PLT 421. EGD 05/03/2022 at [facility] showed duodenal stricture and clean based duodenal ulceration 10 mm.  EGD March 2020 showed Forest 1a spurting vessel within large duodenal bulb ulcer, not amenable to endoscopic intervention for hemostasis, ultimately required IR evaluation and gastroduodenal artery embolization.  Past Medical History:  Diagnosis Date   Abdominal pain, chronic, epigastric    Anxiety    Arthritis    Back pain    Complication of anesthesia    had a syncopal episode after rhinoplasty   Depression    GERD (gastroesophageal reflux disease)    GI bleed    Headache    History of kidney stones    Hypertension    Neurogenic pain    Peptic ulcer disease    PTSD (post-traumatic stress disorder)    Seizures (HCC)    Seizures (HCC)    Past Surgical History:  Procedure Laterality Date   BIOPSY  06/25/2018   Procedure: BIOPSY;  Surgeon: Rehman, Najeeb U, MD;  Location: [facility];  Service: Endoscopy;;  gastric   ESOPHAGOGASTRODUODENOSCOPY (EGD) WITH PROPOFOL N/A 06/25/2018   Procedure:  ESOPHAGOGASTRODUODENOSCOPY (EGD) WITH PROPOFOL;  Surgeon: Rehman, Najeeb U, MD;  Location: [facility];  Service: Endoscopy;  Laterality: N/A;   ESOPHAGOGASTRODUODENOSCOPY (EGD) WITH PROPOFOL N/A 05/13/2022   Procedure: ESOPHAGOGASTRODUODENOSCOPY (EGD) WITH PROPOFOL;  Surgeon: Castaneda Mayorga, Daniel, MD;  Location: [facility];  Service: Gastroenterology;  Laterality: N/A;   NOSE SURGERY     [facility]   ORIF HUMERUS FRACTURE  04/26/2012   Procedure: OPEN REDUCTION INTERNAL FIXATION (ORIF) PROXIMAL HUMERUS FRACTURE;  Surgeon: Stanley E Harrison, MD;  Location: [facility];  Service: Orthopedics;  Laterality: Left;  Open Reduction Internal Fixation of Left Proximal Humerus Fracture, Closing Wedge Osteotomy, Bone Graft   RHINOPLASTY     [facility]   SHOULDER SURGERY     Prior to Admission medications   Medication Sig Start Date End Date Taking? Authorizing Provider  acetaminophen (TYLENOL) 500 MG tablet Take 1,500 mg by mouth every 6 (six) hours as needed for moderate pain.    [provider]  buprenorphine (SUBUTEX) 8 MG SUBL SL tablet Place 1 tablet (8 mg total) under the tongue 3 (three) times daily. 05/12/23     busPIRone (BUSPAR) 30 MG tablet Take 1 tablet by mouth 2 times a day (take with food) 06/10/22     busPIRone (BUSPAR) 30 MG tablet Take 1 tablet (30 mg total) by mouth 2 (two) times daily with food 09/02/22     busPIRone (BUSPAR) 30 MG tablet Take 1 tablet (30 mg total) by mouth 2 (two) times daily with a meal. 12/23/22     busPIRone (  BUSPAR) 30 MG tablet Take 1 tablet (30 mg total) by mouth 2 (two) times daily with food 02/17/23     busPIRone (BUSPAR) 30 MG tablet Take 1 tablet orally 2 times a day (take with food) 05/12/23     escitalopram (LEXAPRO) 10 MG tablet Take 1 tablet by mouth daily 06/10/22     escitalopram (LEXAPRO) 10 MG tablet Take 1 tablet by mouth daily 06/10/22     lamoTRIgine (LAMICTAL) 200 MG tablet Take 1 tablet by mouth daily 06/10/22     lamoTRIgine (LAMICTAL) 200 MG tablet  Take 1 tablet (200 mg total) by mouth daily. 09/02/22     lamoTRIgine (LAMICTAL) 200 MG tablet Take 1 tablet (200 mg total) by mouth daily. 12/23/22     lamoTRIgine (LAMICTAL) 200 MG tablet Take 1 tablet (200 mg total) by mouth daily. 02/17/23     lamoTRIgine (LAMICTAL) 200 MG tablet Take 1 tablet (200 mg total) by mouth daily. 05/12/23     LORazepam (ATIVAN) 0.5 MG tablet Take 1 tablet (0.5 mg total) by mouth daily as needed for severe anxiety (must last 30 days per md) 04/18/21     LORazepam (ATIVAN) 0.5 MG tablet Take 1 tablet by mouth daily as needed for severe anxiety. 08/05/22     LORazepam (ATIVAN) 0.5 MG tablet Take 1 tablet (0.5 mg total) by mouth daily as needed for severe anxiety (this is a one month supply) 09/02/22     LORazepam (ATIVAN) 0.5 MG tablet Take 1 tablet (0.5 mg total) by mouth daily as needed for severe anxiety (30 day supply per md) 09/29/22     LORazepam (ATIVAN) 0.5 MG tablet Take 1 tablet (0.5 mg total) by mouth daily as needed. 04/14/23     LORazepam (ATIVAN) 0.5 MG tablet Take 1 tablet (0.5 mg total) by mouth daily as needed for severe anxiety. (this is a 1 month supply) 05/12/23     pantoprazole (PROTONIX) 40 MG tablet Take 1 tablet by mouth 2 times a day 06/10/22     pantoprazole (PROTONIX) 40 MG tablet Take 1 tablet by mouth  2 times a day 06/10/22     pantoprazole (PROTONIX) 40 MG tablet Take 1 tablet (40 mg total) by mouth 2 (two) times daily. 09/02/22     pantoprazole (PROTONIX) 40 MG tablet Take 1 tablet (40 mg total) by mouth 2 (two) times daily. 12/23/22     prazosin (MINIPRESS) 2 MG capsule Take 1 capsule (2 mg total) by mouth at bedtime. 10/27/22     prazosin (MINIPRESS) 2 MG capsule Take 1 capsule (2 mg total) by mouth at bedtime. 10/27/22     prazosin (MINIPRESS) 2 MG capsule Take 1 capsule (2 mg total) by mouth at bedtime. 11/25/22     prazosin (MINIPRESS) 2 MG capsule Take 1 capsule (2 mg total) by mouth at bedtime. 12/23/22     prazosin (MINIPRESS) 2 MG capsule Take 1  capsule by mouth at bedtime. 01/20/23     prazosin (MINIPRESS) 2 MG capsule Take 1 capsule (2 mg total) by mouth at bedtime. 02/17/23     prazosin (MINIPRESS) 2 MG capsule Take 1 capsule (2 mg total) by mouth at bedtime. 05/12/23     QUEtiapine (SEROQUEL) 25 MG tablet Take 1 tablet (25 mg total) by mouth daily as needed for insomnia. 05/12/23     QUEtiapine (SEROQUEL) 50 MG tablet Take 1 tablet by mouth at bedtime as needed  for insomnia 09/05/21     QUEtiapine (SEROQUEL) 50 MG   tablet Take 1 tablet by mouth at bedtime as needed for insomnia 05/15/22     QUEtiapine (SEROQUEL) 50 MG tablet Take 1 tablet by mouth at bedtime for insomnia 06/10/22     QUEtiapine (SEROQUEL) 50 MG tablet Take 1 tablet by mouth bedtime as needed for insomina 06/10/22     QUEtiapine (SEROQUEL) 50 MG tablet Take 1 tablet (50 mg total) by mouth at bedtime as needed for insomnia 09/02/22      Current Facility-Administered Medications  Medication Dose Route Frequency Provider Last Rate Last Admin   0.9 %  sodium chloride infusion (Manually program via Guardrails IV Fluids)   Intravenous Once Kommor, [place], MD       acetaminophen (TYLENOL) tablet 650 mg  650 mg Oral Q6H PRN Ikramullah, Mir M, MD       Or   acetaminophen (TYLENOL) suppository 650 mg  650 mg Rectal Q6H PRN Ikramullah, Mir M, MD       albuterol (PROVENTIL) (2.5 MG/3ML) 0.083% nebulizer solution 2.5 mg  2.5 mg Nebulization Q2H PRN Ikramullah, Mir M, MD       buprenorphine (SUBUTEX) sublingual tablet 8 mg  8 mg Sublingual TID Ikramullah, Mir M, MD       lamoTRIgine (LAMICTAL) tablet 200 mg  200 mg Oral Daily Ikramullah, Mir M, MD       LORazepam (ATIVAN) tablet 0.5 mg  0.5 mg Oral Q4H PRN Ikramullah, Mir M, MD       metoprolol tartrate (LOPRESSOR) injection 5 mg  5 mg Intravenous Q6H PRN Ikramullah, Mir M, MD       ondansetron (ZOFRAN) tablet 4 mg  4 mg Oral Q6H PRN Ikramullah, Mir M, MD       Or   ondansetron (ZOFRAN) injection 4 mg  4 mg Intravenous Q6H PRN Ikramullah,  Mir M, MD       polyethylene glycol (MIRALAX / GLYCOLAX) packet 17 g  17 g Oral Daily PRN Ikramullah, Mir M, MD       QUEtiapine (SEROQUEL) tablet 25 mg  25 mg Oral QHS PRN Ikramullah, Mir M, MD       traZODone (DESYREL) tablet 25 mg  25 mg Oral QHS PRN Ikramullah, Mir M, MD       Current Outpatient Medications  Medication Sig Dispense Refill   acetaminophen (TYLENOL) 500 MG tablet Take 1,500 mg by mouth every 6 (six) hours as needed for moderate pain.     buprenorphine (SUBUTEX) 8 MG SUBL SL tablet Place 1 tablet (8 mg total) under the tongue 3 (three) times daily. 90 tablet 0   busPIRone (BUSPAR) 30 MG tablet Take 1 tablet by mouth 2 times a day (take with food) 180 tablet 0   busPIRone (BUSPAR) 30 MG tablet Take 1 tablet (30 mg total) by mouth 2 (two) times daily with food 180 tablet 0   busPIRone (BUSPAR) 30 MG tablet Take 1 tablet (30 mg total) by mouth 2 (two) times daily with a meal. 180 tablet 0   busPIRone (BUSPAR) 30 MG tablet Take 1 tablet (30 mg total) by mouth 2 (two) times daily with food 180 tablet 0   busPIRone (BUSPAR) 30 MG tablet Take 1 tablet orally 2 times a day (take with food) 180 tablet 0   escitalopram (LEXAPRO) 10 MG tablet Take 1 tablet by mouth daily 90 tablet 0   escitalopram (LEXAPRO) 10 MG tablet Take 1 tablet by mouth daily 90 tablet 0   lamoTRIgine (LAMICTAL) 200 MG tablet Take 1   tablet by mouth daily 90 tablet 0   lamoTRIgine (LAMICTAL) 200 MG tablet Take 1 tablet (200 mg total) by mouth daily. 90 tablet 0   lamoTRIgine (LAMICTAL) 200 MG tablet Take 1 tablet (200 mg total) by mouth daily. 90 tablet 0   lamoTRIgine (LAMICTAL) 200 MG tablet Take 1 tablet (200 mg total) by mouth daily. 90 tablet 0   lamoTRIgine (LAMICTAL) 200 MG tablet Take 1 tablet (200 mg total) by mouth daily. 90 tablet 0   LORazepam (ATIVAN) 0.5 MG tablet Take 1 tablet (0.5 mg total) by mouth daily as needed for severe anxiety (must last 30 days per md) 10 tablet 0   LORazepam (ATIVAN) 0.5 MG  tablet Take 1 tablet by mouth daily as needed for severe anxiety. 15 tablet 0   LORazepam (ATIVAN) 0.5 MG tablet Take 1 tablet (0.5 mg total) by mouth daily as needed for severe anxiety (this is a one month supply) 15 tablet 0   LORazepam (ATIVAN) 0.5 MG tablet Take 1 tablet (0.5 mg total) by mouth daily as needed for severe anxiety (30 day supply per md) 15 tablet 0   LORazepam (ATIVAN) 0.5 MG tablet Take 1 tablet (0.5 mg total) by mouth daily as needed. 15 tablet 0   LORazepam (ATIVAN) 0.5 MG tablet Take 1 tablet (0.5 mg total) by mouth daily as needed for severe anxiety. (this is a 1 month supply) 15 tablet 0   pantoprazole (PROTONIX) 40 MG tablet Take 1 tablet by mouth 2 times a day 180 tablet 0   pantoprazole (PROTONIX) 40 MG tablet Take 1 tablet by mouth  2 times a day 180 tablet 0   pantoprazole (PROTONIX) 40 MG tablet Take 1 tablet (40 mg total) by mouth 2 (two) times daily. 180 tablet 0   pantoprazole (PROTONIX) 40 MG tablet Take 1 tablet (40 mg total) by mouth 2 (two) times daily. 180 tablet 0   prazosin (MINIPRESS) 2 MG capsule Take 1 capsule (2 mg total) by mouth at bedtime. 30 capsule 0   prazosin (MINIPRESS) 2 MG capsule Take 1 capsule (2 mg total) by mouth at bedtime. 30 capsule 0   prazosin (MINIPRESS) 2 MG capsule Take 1 capsule (2 mg total) by mouth at bedtime. 30 capsule 0   prazosin (MINIPRESS) 2 MG capsule Take 1 capsule (2 mg total) by mouth at bedtime. 30 capsule 0   prazosin (MINIPRESS) 2 MG capsule Take 1 capsule by mouth at bedtime. 30 capsule 0   prazosin (MINIPRESS) 2 MG capsule Take 1 capsule (2 mg total) by mouth at bedtime. 30 capsule 0   prazosin (MINIPRESS) 2 MG capsule Take 1 capsule (2 mg total) by mouth at bedtime. 30 capsule 0   QUEtiapine (SEROQUEL) 25 MG tablet Take 1 tablet (25 mg total) by mouth daily as needed for insomnia. 30 tablet 0   QUEtiapine (SEROQUEL) 50 MG tablet Take 1 tablet by mouth at bedtime as needed  for insomnia 30 tablet 0   QUEtiapine  (SEROQUEL) 50 MG tablet Take 1 tablet by mouth at bedtime as needed for insomnia 30 tablet 0   QUEtiapine (SEROQUEL) 50 MG tablet Take 1 tablet by mouth at bedtime for insomnia 90 tablet 0   QUEtiapine (SEROQUEL) 50 MG tablet Take 1 tablet by mouth bedtime as needed for insomina 90 tablet 0   QUEtiapine (SEROQUEL) 50 MG tablet Take 1 tablet (50 mg total) by mouth at bedtime as needed for insomnia 90 tablet 0   Allergies as of   05/20/2023 - Review Complete 05/20/2023  Allergen Reaction Noted   Tramadol Other (See Comments)    Nsaids  09/19/2018   Family History  Problem Relation Age of Onset   Heart disease Other    Arthritis Other    Anesthesia problems Neg Hx    Hypotension Neg Hx    Malignant hyperthermia Neg Hx    Pseudochol deficiency Neg Hx    Social History   Socioeconomic History   Marital status: Single    Spouse name: Not on file   Number of children: Not on file   Years of education: college   Highest education level: Not on file  Occupational History   Occupation: none    Employer: SELF EMPLOYED  Tobacco Use   Smoking status: Former    Packs/day: 0.50    Years: 7.00    Additional pack years: 0.00    Total pack years: 3.50    Types: E-cigarettes, Cigarettes   Smokeless tobacco: Never  Vaping Use   Vaping Use: Every day  Substance and Sexual Activity   Alcohol use: No   Drug use: Not Currently    Types: Marijuana   Sexual activity: Not on file  Other Topics Concern   Not on file  Social History Narrative   Not on file   Social Determinants of Health   Financial Resource Strain: Not on file  Food Insecurity: Not on file  Transportation Needs: Not on file  Physical Activity: Not on file  Stress: Not on file  Social Connections: Not on file  Intimate Partner Violence: Not on file   Review of Systems:  Review of Systems  Constitutional:  Positive for malaise/fatigue.  Respiratory:  Positive for shortness of breath.   Cardiovascular:  Negative for  chest pain.  Gastrointestinal:  Positive for abdominal pain, blood in stool, melena, nausea and vomiting.  Neurological:  Positive for dizziness.    OBJECTIVE:   Temp:  [97.7 F (36.5 C)] 97.7 F (36.5 C) (06/05 1044) Pulse Rate:  [85-152] 85 (06/05 1315) Resp:  [17-18] 17 (06/05 1315) BP: (116-148)/(63-78) 116/63 (06/05 1315) SpO2:  [99 %-100 %] 99 % (06/05 1315)   Physical Exam Constitutional:      General: He is not in acute distress.    Appearance: He is not ill-appearing, toxic-appearing or diaphoretic.  Cardiovascular:     Rate and Rhythm: Regular rhythm. Tachycardia present.  Pulmonary:     Effort: No respiratory distress.     Breath sounds: Normal breath sounds.  Abdominal:     General: Bowel sounds are normal. There is no distension.     Palpations: Abdomen is soft.     Tenderness: There is abdominal tenderness (epigastric). There is no guarding.  Musculoskeletal:     Right lower leg: No edema.     Left lower leg: No edema.  Skin:    General: Skin is warm and dry.     Coloration: Skin is pale.  Neurological:     Mental Status: He is alert.     Labs: Recent Labs    05/20/23 1137  WBC 19.3*  HGB 4.5*  HCT 16.8*  PLT 421*   BMET Recent Labs    05/20/23 1137  NA 135  K 4.1  CL 100  CO2 20*  GLUCOSE 119*  BUN 30*  CREATININE 1.06  CALCIUM 8.9   LFT Recent Labs    05/20/23 1137  PROT 8.1  ALBUMIN 4.0  AST 19  ALT 12  ALKPHOS 40    BILITOT 0.4   PT/INR No results for input(s): "LABPROT", "INR" in the last 72 hours.  Diagnostic imaging: No results found.  IMPRESSION: Melena Acute blood loss anemia History duodenal ulcer on EGD 04/2022, EGD 02/2019 (required IR embolization of GDA) Gastroesophageal reflux disease Hypertension  PLAN: -2 large bore IV, transfuse for Hgb < 7 -IV PPI Q12Hr -Recommend EGD to further evaluate melena, anemia. Discussed benefits, alternatives, risks including bleeding/infection/perforation/missed  lesion/anesthesia, patient verbalized understanding and elected to proceed -Plan for EGD 05/21/23 pending Hgb (needs to be greater than 7) -Maintenance IV fluids -Check iron studies -Avoidance of all NSAIDs -Ok for soft diet today, NPO at midnight for EGD 05/21/23 -Further recommendations to follow pending endoscopy   LOS: 0 days   Mellie Buccellato, DO [facility]    

## 2023-05-20 NOTE — ED Provider Notes (Signed)
[place] EMERGENCY DEPARTMENT AT [facility] Provider Note  CSN: [id number] Arrival date & time: 05/20/23 1042  Chief Complaint(s) Emesis  HPI Alan Jennings is a 42 y.o. male with PMH previous GI bleed, depression, anxiety who since emergency room for evaluation of coffee-ground emesis and syncope.  States that over the last 3 days he has had progressive worsening epigastric pain and had a few episodes of coffee-ground emesis.  He states that he has been increasingly orthostatic over the last 24 hours with multiple syncopal episodes at home.  Patient arrives pale and ill-appearing and had to be assisted with getting out of a car in the ER today.  While in the emergency room, patient was alert and oriented answering questions appropriately with complaints of some mild epigastric pain and shortness of breath but denies chest pain, fever, dark stools or other systemic symptoms.   Past Medical History Past Medical History:  Diagnosis Date   Abdominal pain, chronic, epigastric    Anxiety    Arthritis    Back pain    Complication of anesthesia    had a syncopal episode after rhinoplasty   Depression    GERD (gastroesophageal reflux disease)    GI bleed    Headache    History of kidney stones    Hypertension    Neurogenic pain    Peptic ulcer disease    PTSD (post-traumatic stress disorder)    Seizures (HCC)    Seizures (HCC)    Patient Active Problem List   Diagnosis Date Noted   Opioid dependence (HCC) 05/14/2022   Supraventricular tachycardia 05/14/2022   Upper GI bleed 05/13/2022   Seizures (HCC) 05/12/2022   PTSD (post-traumatic stress disorder) 05/12/2022   HTN (hypertension) 05/12/2022   Gastric outlet obstruction 05/12/2022    Class: Acute   Gastroesophageal reflux disease without esophagitis 05/13/2018   Hypokalemia 06/03/2012   Home Medication(s) Prior to Admission medications   Medication Sig Start Date End Date Taking? Authorizing Provider   acetaminophen (TYLENOL) 500 MG tablet Take 1,500 mg by mouth every 6 (six) hours as needed for moderate pain.    [provider]  buprenorphine (SUBUTEX) 8 MG SUBL SL tablet Place 1 tablet (8 mg total) under the tongue 3 (three) times daily. 05/12/23     busPIRone (BUSPAR) 30 MG tablet Take 1 tablet by mouth 2 times a day (take with food) 06/10/22     busPIRone (BUSPAR) 30 MG tablet Take 1 tablet (30 mg total) by mouth 2 (two) times daily with food 09/02/22     busPIRone (BUSPAR) 30 MG tablet Take 1 tablet (30 mg total) by mouth 2 (two) times daily with a meal. 12/23/22     busPIRone (BUSPAR) 30 MG tablet Take 1 tablet (30 mg total) by mouth 2 (two) times daily with food 02/17/23     busPIRone (BUSPAR) 30 MG tablet Take 1 tablet orally 2 times a day (take with food) 05/12/23     escitalopram (LEXAPRO) 10 MG tablet Take 1 tablet by mouth daily 06/10/22     escitalopram (LEXAPRO) 10 MG tablet Take 1 tablet by mouth daily 06/10/22     lamoTRIgine (LAMICTAL) 200 MG tablet Take 1 tablet by mouth daily 06/10/22     lamoTRIgine (LAMICTAL) 200 MG tablet Take 1 tablet (200 mg total) by mouth daily. 09/02/22     lamoTRIgine (LAMICTAL) 200 MG tablet Take 1 tablet (200 mg total) by mouth daily. 12/23/22     lamoTRIgine (LAMICTAL) 200  MG tablet Take 1 tablet (200 mg total) by mouth daily. 02/17/23     lamoTRIgine (LAMICTAL) 200 MG tablet Take 1 tablet (200 mg total) by mouth daily. 05/12/23     LORazepam (ATIVAN) 0.5 MG tablet Take 1 tablet (0.5 mg total) by mouth daily as needed for severe anxiety (must last 30 days per md) 04/18/21     LORazepam (ATIVAN) 0.5 MG tablet Take 1 tablet by mouth daily as needed for severe anxiety. 08/05/22     LORazepam (ATIVAN) 0.5 MG tablet Take 1 tablet (0.5 mg total) by mouth daily as needed for severe anxiety (this is a one month supply) 09/02/22     LORazepam (ATIVAN) 0.5 MG tablet Take 1 tablet (0.5 mg total) by mouth daily as needed for severe anxiety (30 day supply per md) 09/29/22      LORazepam (ATIVAN) 0.5 MG tablet Take 1 tablet (0.5 mg total) by mouth daily as needed. 04/14/23     LORazepam (ATIVAN) 0.5 MG tablet Take 1 tablet (0.5 mg total) by mouth daily as needed for severe anxiety. (this is a 1 month supply) 05/12/23     pantoprazole (PROTONIX) 40 MG tablet Take 1 tablet by mouth 2 times a day 06/10/22     pantoprazole (PROTONIX) 40 MG tablet Take 1 tablet by mouth  2 times a day 06/10/22     pantoprazole (PROTONIX) 40 MG tablet Take 1 tablet (40 mg total) by mouth 2 (two) times daily. 09/02/22     pantoprazole (PROTONIX) 40 MG tablet Take 1 tablet (40 mg total) by mouth 2 (two) times daily. 12/23/22     prazosin (MINIPRESS) 2 MG capsule Take 1 capsule (2 mg total) by mouth at bedtime. 10/27/22     prazosin (MINIPRESS) 2 MG capsule Take 1 capsule (2 mg total) by mouth at bedtime. 10/27/22     prazosin (MINIPRESS) 2 MG capsule Take 1 capsule (2 mg total) by mouth at bedtime. 11/25/22     prazosin (MINIPRESS) 2 MG capsule Take 1 capsule (2 mg total) by mouth at bedtime. 12/23/22     prazosin (MINIPRESS) 2 MG capsule Take 1 capsule by mouth at bedtime. 01/20/23     prazosin (MINIPRESS) 2 MG capsule Take 1 capsule (2 mg total) by mouth at bedtime. 02/17/23     prazosin (MINIPRESS) 2 MG capsule Take 1 capsule (2 mg total) by mouth at bedtime. 05/12/23     QUEtiapine (SEROQUEL) 25 MG tablet Take 1 tablet (25 mg total) by mouth daily as needed for insomnia. 05/12/23     QUEtiapine (SEROQUEL) 50 MG tablet Take 1 tablet by mouth at bedtime as needed  for insomnia 09/05/21     QUEtiapine (SEROQUEL) 50 MG tablet Take 1 tablet by mouth at bedtime as needed for insomnia 05/15/22     QUEtiapine (SEROQUEL) 50 MG tablet Take 1 tablet by mouth at bedtime for insomnia 06/10/22     QUEtiapine (SEROQUEL) 50 MG tablet Take 1 tablet by mouth bedtime as needed for insomina 06/10/22     QUEtiapine (SEROQUEL) 50 MG tablet Take 1 tablet (50 mg total) by mouth at bedtime as needed for insomnia 09/02/22  Past Surgical History Past Surgical History:  Procedure Laterality Date   BIOPSY  06/25/2018   Procedure: BIOPSY;  Surgeon: Malissa Hippo, MD;  Location: [facility];  Service: Endoscopy;;  gastric   ESOPHAGOGASTRODUODENOSCOPY (EGD) WITH PROPOFOL N/A 06/25/2018   Procedure: ESOPHAGOGASTRODUODENOSCOPY (EGD) WITH PROPOFOL;  Surgeon: Malissa Hippo, MD;  Location: [facility];  Service: Endoscopy;  Laterality: N/A;   ESOPHAGOGASTRODUODENOSCOPY (EGD) WITH PROPOFOL N/A 05/13/2022   Procedure: ESOPHAGOGASTRODUODENOSCOPY (EGD) WITH PROPOFOL;  Surgeon: Dolores Frame, MD;  Location: [facility];  Service: Gastroenterology;  Laterality: N/A;   NOSE SURGERY     [facility]   ORIF HUMERUS FRACTURE  04/26/2012   Procedure: OPEN REDUCTION INTERNAL FIXATION (ORIF) PROXIMAL HUMERUS FRACTURE;  Surgeon: Vickki Hearing, MD;  Location: [facility];  Service: Orthopedics;  Laterality: Left;  Open Reduction Internal Fixation of Left Proximal Humerus Fracture, Closing Wedge Osteotomy, Bone Graft   RHINOPLASTY     [facility]   SHOULDER SURGERY     Family History Family History  Problem Relation Age of Onset   Heart disease Other    Arthritis Other    Anesthesia problems Neg Hx    Hypotension Neg Hx    Malignant hyperthermia Neg Hx    Pseudochol deficiency Neg Hx     Social History Social History   Tobacco Use   Smoking status: Former    Packs/day: 0.50    Years: 7.00    Additional pack years: 0.00    Total pack years: 3.50    Types: E-cigarettes, Cigarettes   Smokeless tobacco: Never  Vaping Use   Vaping Use: Every day  Substance Use Topics   Alcohol use: No   Drug use: Not Currently    Types: Marijuana   Allergies Tramadol and Nsaids  Review of Systems Review of Systems  Gastrointestinal:  Positive for abdominal pain and vomiting.  Neurological:  Positive for  syncope.    Physical Exam Vital Signs  I have reviewed the triage vital signs BP 128/71   Pulse 98   Temp 97.7 F (36.5 C) (Oral)   Resp 18   SpO2 100%   Physical Exam Constitutional:      General: He is not in acute distress.    Appearance: Normal appearance.  HENT:     Head: Normocephalic and atraumatic.     Nose: No congestion or rhinorrhea.  Eyes:     General:        Right eye: No discharge.        Left eye: No discharge.     Extraocular Movements: Extraocular movements intact.     Pupils: Pupils are equal, round, and reactive to light.  Cardiovascular:     Rate and Rhythm: Regular rhythm. Tachycardia present.     Heart sounds: No murmur heard. Pulmonary:     Effort: No respiratory distress.     Breath sounds: No wheezing or rales.  Abdominal:     General: There is no distension.     Tenderness: There is abdominal tenderness.  Musculoskeletal:        General: Normal range of motion.     Cervical back: Normal range of motion.  Skin:    General: Skin is warm and dry.     Coloration: Skin is pale.  Neurological:     General: No focal deficit present.     Mental Status: He is alert.     ED Results and Treatments Labs (all labs ordered are listed, but only  abnormal results are displayed) Labs Reviewed  COMPREHENSIVE METABOLIC PANEL - Abnormal; Notable for the following components:      Result Value   CO2 20 (*)    Glucose, Bld 119 (*)    BUN 30 (*)    All other components within normal limits  CBC WITH DIFFERENTIAL/PLATELET - Abnormal; Notable for the following components:   WBC 19.3 (*)    RBC 2.32 (*)    Hemoglobin 4.5 (*)    HCT 16.8 (*)    MCV 72.4 (*)    MCH 19.4 (*)    MCHC 26.8 (*)    RDW 17.2 (*)    Platelets 421 (*)    Neutro Abs 17.7 (*)    Abs Immature Granulocytes 0.13 (*)    All other components within normal limits  TYPE AND SCREEN  PREPARE RBC (CROSSMATCH)                                                                                                                           Radiology No results found.  Pertinent labs & imaging results that were available during my care of the patient were reviewed by me and considered in my medical decision making (see MDM for details).  Medications Ordered in ED Medications  0.9 %  sodium chloride infusion (Manually program via Guardrails IV Fluids) (has no administration in time range)  lactated ringers bolus 1,000 mL (1,000 mLs Intravenous New Bag/Given 05/20/23 1150)  pantoprazole (PROTONIX) injection 40 mg (40 mg Intravenous Given 05/20/23 1223)                                                                                                                                     Procedures .Critical Care  Performed by: Glendora Score, MD Authorized by: Glendora Score, MD   Critical care provider statement:    Critical care time (minutes):  30   Critical care was necessary to treat or prevent imminent or life-threatening deterioration of the following conditions: Critical anemia requiring blood transfusion.   Critical care was time spent personally by me on the following activities:  Development of treatment plan with patient or surrogate, discussions with consultants, evaluation of patient's response to treatment, examination of patient, ordering and review of laboratory studies, ordering and review of radiographic studies, ordering and performing treatments and interventions, pulse oximetry, re-evaluation of patient's condition and review of old  charts   (including critical care time)  Medical Decision Making / ED Course   This patient presents to the ED for concern of abdominal pain, hematemesis, this involves an extensive number of treatment options, and is a complaint that carries with it a high risk of complications and morbidity.  The differential diagnosis includes upper GI bleed including PUD, varices, boerhaave's, Mallory Weiss tear, aortoenteric fistula versus lower GI  bleed including mass, diverticulosis/diverticulitis, hemorrhoids, anal fissure, mesenteric ischemia, aortoenteric fistula, colitis  MDM: Patient seen emergency room for evaluation of epigastric pain, syncope and coffee-ground emesis.  Physical exam reveals significant skin pallor and mild epigastric tenderness palpation but is otherwise unremarkable.  Laboratory evaluation is concerning with a hemoglobin of 4.5 with an MCV of 72.4.  Labs otherwise unremarkable.  PPI initiated and 4 units packed red blood cells ordered.  I spoke with the gastroenterologist on-call Dr. Lorenso Quarry who is recommending PPI, resuscitation and gastroenterology team will evaluate the patient while inpatient.  Patient been admitted to medicine for suspected upper GI bleed   Additional history obtained: -Additional history obtained from mother -External records from outside source obtained and reviewed including: Chart review including previous notes, labs, imaging, consultation notes   Lab Tests: -I ordered, reviewed, and interpreted labs.   The pertinent results include:   Labs Reviewed  COMPREHENSIVE METABOLIC PANEL - Abnormal; Notable for the following components:      Result Value   CO2 20 (*)    Glucose, Bld 119 (*)    BUN 30 (*)    All other components within normal limits  CBC WITH DIFFERENTIAL/PLATELET - Abnormal; Notable for the following components:   WBC 19.3 (*)    RBC 2.32 (*)    Hemoglobin 4.5 (*)    HCT 16.8 (*)    MCV 72.4 (*)    MCH 19.4 (*)    MCHC 26.8 (*)    RDW 17.2 (*)    Platelets 421 (*)    Neutro Abs 17.7 (*)    Abs Immature Granulocytes 0.13 (*)    All other components within normal limits  TYPE AND SCREEN  PREPARE RBC (CROSSMATCH)      EKG   EKG Interpretation  Date/Time:  Wednesday May 20 2023 10:53:04 EDT Ventricular Rate:  102 PR Interval:  123 QRS Duration: 74 QT Interval:  315 QTC Calculation: 411 R Axis:   74 Text Interpretation: Sinus tachycardia Biatrial  enlargement Baseline wander in lead(s) I II aVR Confirmed by Deserai Cansler ([id number]) on 05/20/2023 12:44:05 PM          Medicines ordered and prescription drug management: Meds ordered this encounter  Medications   lactated ringers bolus 1,000 mL   0.9 %  sodium chloride infusion (Manually program via Guardrails IV Fluids)   pantoprazole (PROTONIX) injection 40 mg    -I have reviewed the patients home medicines and have made adjustments as needed  Critical interventions Packed blood cell administration  Consultations Obtained: I requested consultation with the gastroenterologist on-call Dr. Lorenso Quarry ,  and discussed lab and imaging findings as well as pertinent plan - they recommend: PPI, hospital   Cardiac Monitoring: The patient was maintained on a cardiac monitor.  I personally viewed and interpreted the cardiac monitored which showed an underlying rhythm of: Sinus tachycardia, NSR  Social Determinants of Health:  Factors impacting patients care include: Previous history of opioid use but currently in remission doing well on Subutex   Reevaluation: After the interventions noted above, I reevaluated the  patient and found that they have :improved  Co morbidities that complicate the patient evaluation  Past Medical History:  Diagnosis Date   Abdominal pain, chronic, epigastric    Anxiety    Arthritis    Back pain    Complication of anesthesia    had a syncopal episode after rhinoplasty   Depression    GERD (gastroesophageal reflux disease)    GI bleed    Headache    History of kidney stones    Hypertension    Neurogenic pain    Peptic ulcer disease    PTSD (post-traumatic stress disorder)    Seizures (HCC)    Seizures (HCC)       Dispostion: I considered admission for this patient, and due to suspected upper GI bleed with critical anemia patient require hospital admission     Final Clinical Impression(s) / ED Diagnoses Final diagnoses:  Gastrointestinal  hemorrhage, unspecified gastrointestinal hemorrhage type  Anemia, unspecified type     @PCDICTATION @    Glendora Score, MD 05/20/23 1245

## 2023-05-21 ENCOUNTER — Encounter (HOSPITAL_COMMUNITY)
Admission: EM | Disposition: A | Discharge status: Home / Self Care | Payer: Self-pay | Source: Home / Self Care | Attending: Internal Medicine

## 2023-05-21 ENCOUNTER — Inpatient Hospital Stay (HOSPITAL_COMMUNITY): Payer: Medicaid Other | Admitting: Anesthesiology

## 2023-05-21 ENCOUNTER — Other Ambulatory Visit (HOSPITAL_COMMUNITY): Payer: Self-pay

## 2023-05-21 ENCOUNTER — Other Ambulatory Visit: Payer: Self-pay

## 2023-05-21 ENCOUNTER — Encounter (HOSPITAL_COMMUNITY): Payer: Self-pay | Admitting: Internal Medicine

## 2023-05-21 DIAGNOSIS — K269 Duodenal ulcer, unspecified as acute or chronic, without hemorrhage or perforation: Secondary | ICD-10-CM | POA: Diagnosis not present

## 2023-05-21 DIAGNOSIS — K315 Obstruction of duodenum: Secondary | ICD-10-CM

## 2023-05-21 DIAGNOSIS — K2971 Gastritis, unspecified, with bleeding: Secondary | ICD-10-CM

## 2023-05-21 DIAGNOSIS — F32A Depression, unspecified: Secondary | ICD-10-CM | POA: Diagnosis not present

## 2023-05-21 DIAGNOSIS — F419 Anxiety disorder, unspecified: Secondary | ICD-10-CM | POA: Diagnosis not present

## 2023-05-21 DIAGNOSIS — Z87891 Personal history of nicotine dependence: Secondary | ICD-10-CM

## 2023-05-21 DIAGNOSIS — D62 Acute posthemorrhagic anemia: Secondary | ICD-10-CM

## 2023-05-21 DIAGNOSIS — K922 Gastrointestinal hemorrhage, unspecified: Secondary | ICD-10-CM | POA: Diagnosis not present

## 2023-05-21 DIAGNOSIS — I10 Essential (primary) hypertension: Secondary | ICD-10-CM

## 2023-05-21 HISTORY — PX: BIOPSY: SHX5522

## 2023-05-21 HISTORY — PX: ESOPHAGOGASTRODUODENOSCOPY: SHX5428

## 2023-05-21 LAB — HEMOGLOBIN AND HEMATOCRIT, BLOOD
HCT: 27.1 % — ABNORMAL LOW (ref 39.0–52.0)
HCT: 27.6 % — ABNORMAL LOW (ref 39.0–52.0)
Hemoglobin: 8.5 g/dL — ABNORMAL LOW (ref 13.0–17.0)
Hemoglobin: 8.7 g/dL — ABNORMAL LOW (ref 13.0–17.0)

## 2023-05-21 LAB — CBC
HCT: 26.5 % — ABNORMAL LOW (ref 39.0–52.0)
Hemoglobin: 8.5 g/dL — ABNORMAL LOW (ref 13.0–17.0)
MCH: 24.9 pg — ABNORMAL LOW (ref 26.0–34.0)
MCHC: 32.1 g/dL (ref 30.0–36.0)
MCV: 77.5 fL — ABNORMAL LOW (ref 80.0–100.0)
Platelets: 287 10*3/uL (ref 150–400)
RBC: 3.42 MIL/uL — ABNORMAL LOW (ref 4.22–5.81)
RDW: 17.9 % — ABNORMAL HIGH (ref 11.5–15.5)
WBC: 11.2 10*3/uL — ABNORMAL HIGH (ref 4.0–10.5)
nRBC: 0 % (ref 0.0–0.2)

## 2023-05-21 LAB — BASIC METABOLIC PANEL
Anion gap: 8 (ref 5–15)
BUN: 19 mg/dL (ref 6–20)
CO2: 21 mmol/L — ABNORMAL LOW (ref 22–32)
Calcium: 8.1 mg/dL — ABNORMAL LOW (ref 8.9–10.3)
Chloride: 102 mmol/L (ref 98–111)
Creatinine, Ser: 0.92 mg/dL (ref 0.61–1.24)
GFR, Estimated: >60 mL/min (ref >60)
Glucose, Bld: 103 mg/dL — ABNORMAL HIGH (ref 70–99)
Potassium: 3.4 mmol/L — ABNORMAL LOW (ref 3.5–5.1)
Sodium: 131 mmol/L — ABNORMAL LOW (ref 135–145)

## 2023-05-21 LAB — BPAM RBC
Blood Product Expiration Date: 202406282359
ISSUE DATE / TIME: 202406051734
Unit Type and Rh: 6200

## 2023-05-21 LAB — TYPE AND SCREEN
ABO/RH(D): A POS
Unit division: 0

## 2023-05-21 LAB — HIV ANTIBODY (ROUTINE TESTING W REFLEX): HIV Screen 4th Generation wRfx: NONREACTIVE

## 2023-05-21 SURGERY — EGD (ESOPHAGOGASTRODUODENOSCOPY)
Anesthesia: Monitor Anesthesia Care

## 2023-05-21 MED ORDER — LACTATED RINGERS IV SOLN
INTRAVENOUS | Status: AC | PRN
  Administered 2023-05-21: 1000 mL via INTRAVENOUS

## 2023-05-21 MED ORDER — DEXMEDETOMIDINE HCL IN NACL 200 MCG/50ML IV SOLN
INTRAVENOUS | Status: DC | PRN
  Administered 2023-05-21: 12 ug via INTRAVENOUS

## 2023-05-21 MED ORDER — LIDOCAINE 2% (20 MG/ML) 5 ML SYRINGE
INTRAMUSCULAR | Status: DC | PRN
  Administered 2023-05-21: 100 mg via INTRAVENOUS

## 2023-05-21 MED ORDER — LACTATED RINGERS IV SOLN: INTRAVENOUS | Status: DC

## 2023-05-21 MED ORDER — PROPOFOL 10 MG/ML IV BOLUS
INTRAVENOUS | Status: DC | PRN
  Administered 2023-05-21 (×4): 30 mg via INTRAVENOUS

## 2023-05-21 MED ORDER — PROPOFOL 500 MG/50ML IV EMUL
INTRAVENOUS | Status: DC | PRN
  Administered 2023-05-21: 150 ug/kg/min via INTRAVENOUS

## 2023-05-21 MED ORDER — PROPOFOL 500 MG/50ML IV EMUL
INTRAVENOUS | Status: AC
  Filled 2023-05-21: qty 50

## 2023-05-21 NOTE — Assessment & Plan Note (Signed)
Continue Lamictal 

## 2023-05-21 NOTE — Assessment & Plan Note (Addendum)
-   Known history of PUD with history of prior GI bleeds.  Last EGD May 2023 at [facility] showing duodenal stricture and clean-based duodenal ulcer -Hemoglobin 4.5 g/dL on admission.  Received 4 units PRBC with improvement to 8.5 g/dL - GI following - EGD performed 05/21/2023 showing 2 duodenal ulcers and underlying duodenal stricture -Hemoglobin stable at discharge, 8.8 g/dL - Continued on Protonix and sucralfate at discharge - Outpatient follow-up with GI as well

## 2023-05-21 NOTE — Assessment & Plan Note (Addendum)
Continue Seroquel. 

## 2023-05-21 NOTE — Op Note (Signed)
[facility] Patient Name: Alan Jennings Procedure Date: 05/21/2023 MRN: [id number] Attending MD: Liliane Shi DO, DO, [id number] Date of Birth: 06-07-1981 CSN: [id number] Age: 42 Admit Type: Inpatient Procedure:                Upper GI endoscopy Indications:              Acute post hemorrhagic anemia, Melena Providers:                Liliane Shi DO, DO, Lorenza Evangelist, RN Referring MD:              Medicines:                See the Anesthesia note for documentation of the                            administered medications Complications:            No immediate complications. Estimated Blood Loss:     Estimated blood loss was minimal. Procedure:                Pre-Anesthesia Assessment:                           - ASA Grade Assessment: IV - A patient with severe                            systemic disease that is a constant threat to life.                           - The risks and benefits of the procedure and the                            sedation options and risks were discussed with the                            patient. All questions were answered and informed                            consent was obtained.                           After obtaining informed consent, the endoscope was                            passed under direct vision. Throughout the                            procedure, the patient's blood pressure, pulse, and                            oxygen saturations were monitored continuously. The                            [id number]-[id number] ([id number]) Olympus endoscope was introduced  through the mouth, and advanced to the duodenal                            bulb. The upper GI endoscopy was technically                            difficult and complex due to abnormal anatomy and                            poor endoscopic visualization. Successful                            completion of the procedure was aided by lavage.                             The patient tolerated the procedure well. Scope In: Scope Out: Findings:      The Z-line was regular and was found 40 cm from the incisors.      A large amount of food (residue) was found in the entire examined       stomach. Lavage of the area was performed using a moderate amount,       resulting in clearance with fair visualization. Suction of roughly 1,000       mL of food/fluid was completed.      Mild inflammation characterized by congestion (edema) was found in the       gastric antrum. Biopsies were taken with a cold forceps for Helicobacter       pylori testing.      One non-bleeding cratered duodenal ulcer with a clean ulcer base       (Forrest Class III) was found in the duodenal bulb. The lesion was 12 mm       in largest dimension.      One non-bleeding cratered duodenal ulcer with a clean ulcer base       (Forrest Class III) was found in the duodenal bulb. The lesion was 10 mm       in largest dimension.      An acquired benign-appearing, intrinsic severe stenosis was found in the       second portion of the duodenum and was non-traversed. Dilation of       stenosis was not attempted given (1) inability to traverse and see       distal to stenosis and (2) active, clean based ulcer involving stenosis. Impression:               - Z-line regular, 40 cm from the incisors.                           - A large amount of food (residue) in the stomach.                           - Gastritis. Biopsied.                           - Non-bleeding duodenal ulcer with a clean ulcer  base (Forrest Class III).                           - Non-bleeding duodenal ulcer with a clean ulcer                            base (Forrest Class III).                           - Acquired duodenal stenosis. Moderate Sedation:      See the other procedure note for documentation of moderate sedation with       intraservice time. Recommendation:           - NPO.                            - Use Protonix (pantoprazole) 40 mg IV daily.                           - Use sucralfate suspension 1 gram PO QID.                           - Await pathology results.                           - Small bowel follow through study to evaluate                            duodenal stricture.                           - Refer to a surgeon today. Procedure Code(s):        --- Professional ---                           [id number], Esophagogastroduodenoscopy, flexible,                            transoral; with biopsy, single or multiple Diagnosis Code(s):        --- Professional ---                           K29.70, Gastritis, unspecified, without bleeding                           K26.9, Duodenal ulcer, unspecified as acute or                            chronic, without hemorrhage or perforation                           K31.5, Obstruction of duodenum                           D62, Acute posthemorrhagic anemia  K92.1, Melena (includes Hematochezia) CPT copyright 2022 American Medical Association. All rights reserved. The codes documented in this report are preliminary and upon coder review may  be revised to meet current compliance requirements. Dr Liliane Shi, DO Liliane Shi DO, DO 05/21/2023 2:19:46 PM Number of Addenda: 0

## 2023-05-21 NOTE — Transfer of Care (Signed)
Immediate Anesthesia Transfer of Care Note  Patient: Alan Jennings  Procedure(s) Performed: ESOPHAGOGASTRODUODENOSCOPY (EGD) BIOPSY  Patient Location: PACU  Anesthesia Type:MAC  Level of Consciousness: sedated  Airway & Oxygen Therapy: Patient Spontanous Breathing and Patient connected to face mask oxygen  Post-op Assessment: Report given to RN and Post -op Vital signs reviewed and stable  Post vital signs: Reviewed and stable  Last Vitals:  Vitals Value Taken Time  BP    Temp    Pulse    Resp    SpO2      Last Pain:  Vitals:   05/21/23 1258  TempSrc: Temporal  PainSc: 0-No pain      Patients Stated Pain Goal: 0 (05/20/23 2048)  Complications: No notable events documented.

## 2023-05-21 NOTE — Hospital Course (Addendum)
Alan Jennings is a 42 yo male with PMH PUD, GIB, depression/anxiety, HTN, PTSD, seizures who presented with abdominal pain and coffee-ground emesis. He has remained compliant on PPI outpatient and has continued to avoid NSAID products.  On arrival to the ER, he underwent further workup and was found to have hemoglobin 4.5 g/dL.  He was ordered 4 units PRBC and GI was consulted.  He underwent EGD on 05/21/2023 which showed 2 nonbleeding cratered duodenal ulcers and a duodenal stricture. He then underwent small bowel follow-through after EGD.  It revealed obvious obstruction in the duodenum but did show contrast able to slowly passed through. He was evaluated by general surgery and he will have outpatient follow-ups.  He was anxious for discharge and was continued on Protonix and sucralfate at discharge.  He was recommended to continue on a soft diet and avoiding large pieces of food and meats. Hemoglobin was stable at discharge, 8.8 g/dL.  He also had no further complaints of coffee-ground emesis or other signs of bleeding.

## 2023-05-21 NOTE — TOC CM/SW Note (Signed)
Transition of Care [facility]) - Inpatient Brief Assessment   Patient Details  Name: GORAN FINERAN MRN: [id number] Date of Birth: Oct 13, 1981  [facility] CM/SW Contact:    Howell Rucks, RN Phone Number: 05/21/2023, 11:45 AM   Clinical Narrative: Met with pt at bedside, Brief assessment completed. No TOC needs identified.     Transition of Care Asessment: Insurance and Status: Insurance coverage has been reviewed Patient has primary care physician: Yes [facility]) Home environment has been reviewed: private residence, good family support Prior level of function:: Independent Prior/Current Home Services: No current home services Social Determinants of Health Reivew: SDOH reviewed no interventions necessary Readmission risk has been reviewed: Yes Transition of care needs: no transition of care needs at this time

## 2023-05-21 NOTE — Assessment & Plan Note (Addendum)
-   Continue Ativan; per med rec, not taking BuSpar or Lexapro

## 2023-05-21 NOTE — Assessment & Plan Note (Addendum)
-   no meds currently - BP controlled

## 2023-05-21 NOTE — Assessment & Plan Note (Signed)
Continue Protonix °

## 2023-05-21 NOTE — Anesthesia Postprocedure Evaluation (Signed)
Anesthesia Post Note  Patient: RIGOBERTO NANDIN  Procedure(s) Performed: ESOPHAGOGASTRODUODENOSCOPY (EGD) BIOPSY     Patient location during evaluation: Endoscopy Anesthesia Type: MAC Level of consciousness: awake and alert Pain management: pain level controlled Vital Signs Assessment: post-procedure vital signs reviewed and stable Respiratory status: spontaneous breathing, nonlabored ventilation, respiratory function stable and patient connected to nasal cannula oxygen Cardiovascular status: blood pressure returned to baseline and stable Postop Assessment: no apparent nausea or vomiting Anesthetic complications: no   No notable events documented.  Last Vitals:  Vitals:   05/21/23 1415 05/21/23 1425  BP: 120/84 (!) 119/51  Pulse: 68 71  Resp: 12 11  Temp:    SpO2: 97% 98%    Last Pain:  Vitals:   05/21/23 1425  TempSrc:   PainSc: 7                  Trevor Iha

## 2023-05-21 NOTE — Progress Notes (Addendum)
Progress Note    Alan Jennings   BJY:[id number]  DOB: Jul 31, 1981  DOA: 05/20/2023     1 PCP: Vertis Kelch, NP  Initial CC: coffee emesis and abd pain  Hospital Course: Alan Jennings is a 42 yo male with PMH PUD, GIB, depression/anxiety, HTN, PTSD, seizures who presented with abdominal pain and coffee-ground emesis. He has remained compliant on PPI outpatient and has continued to avoid NSAID products.  On arrival to the ER, he underwent further workup and was found to have hemoglobin 4.5 g/dL.  He was ordered 4 units PRBC and GI was consulted.  He underwent EGD on 05/21/2023 which showed 2 nonbleeding cratered duodenal ulcers and a duodenal stricture.  Interval History:  Seen in his room this morning prior to EGD.  Pain had subsided some and no further coffee-ground emesis episodes.  Episode reminded him of prior bleeding and therefore he knew to come to the hospital. Tolerated blood transfusions overnight.  Assessment and Plan: * Upper GI bleed - Known history of PUD with history of prior GI bleeds.  Last EGD May 2023 at [facility] showing duodenal stricture and clean-based duodenal ulcer -Hemoglobin 4.5 g/dL on admission.  Received 4 units PRBC with improvement to 8.5 g/dL - GI following - EGD performed 05/21/2023 showing 2 duodenal ulcers and underlying duodenal stricture - Small bowel follow-through ordered per GI.  Keep n.p.o. - Resume fluids post EGD - Surgery consulted   Anxiety and depression - Continue Ativan; per med rec, not taking BuSpar or Lexapro  HTN (hypertension) - no meds currently - BP controlled  PTSD (post-traumatic stress disorder) - Continue Seroquel  Seizures (HCC) - Continue Lamictal  Gastroesophageal reflux disease without esophagitis - Continue Protonix   Old records reviewed in assessment of this patient  Antimicrobials:   DVT prophylaxis:  SCDs Start: 05/20/23 1326   Code Status:   Code Status: Full Code  Mobility Assessment (last 72 hours)      Mobility Assessment     Row Name 05/21/23 0846 05/20/23 1548         Does patient have an order for bedrest or is patient medically unstable No - Continue assessment No - Continue assessment      What is the highest level of mobility based on the progressive mobility assessment? Level 5 (Walks with assist in room/hall) - Balance while stepping forward/back and can walk in room with assist - Complete Level 1 (Bedfast) - Unable to balance while sitting on edge of bed      Is the above level different from baseline mobility prior to current illness? -- Yes - Recommend PT order               Barriers to discharge: none Disposition Plan:  Home Status is: Inpt  Objective: Blood pressure (!) 119/51, pulse 72, temperature 98.9 F (37.2 C), temperature source Oral, resp. rate 18, height 5\' 8"  (1.727 m), weight 61.2 kg, SpO2 100 %.  Examination:  Physical Exam Constitutional:      General: He is not in acute distress.    Appearance: Normal appearance.  HENT:     Head: Normocephalic and atraumatic.     Mouth/Throat:     Mouth: Mucous membranes are moist.  Eyes:     Extraocular Movements: Extraocular movements intact.  Cardiovascular:     Rate and Rhythm: Normal rate and regular rhythm.  Pulmonary:     Effort: Pulmonary effort is normal. No respiratory distress.     Breath sounds:  Normal breath sounds. No wheezing.  Abdominal:     General: Bowel sounds are normal. There is no distension.     Palpations: Abdomen is soft.     Tenderness: There is no abdominal tenderness.  Musculoskeletal:        General: Normal range of motion.     Cervical back: Normal range of motion and neck supple.  Skin:    General: Skin is warm and dry.  Neurological:     General: No focal deficit present.     Mental Status: He is alert.  Psychiatric:        Mood and Affect: Mood normal.        Behavior: Behavior normal.      Consultants:  GI General surgery   Procedures:  05/21/23:  EGD  Data Reviewed: Results for orders placed or performed during the hospital encounter of 05/20/23 (from the past 24 hour(s))  Basic metabolic panel     Status: Abnormal   Collection Time: 05/21/23  5:02 AM  Result Value Ref Range   Sodium 131 (L) 135 - 145 mmol/L   Potassium 3.4 (L) 3.5 - 5.1 mmol/L   Chloride 102 98 - 111 mmol/L   CO2 21 (L) 22 - 32 mmol/L   Glucose, Bld 103 (H) 70 - 99 mg/dL   BUN 19 6 - 20 mg/dL   Creatinine, Ser 8.41 0.61 - 1.24 mg/dL   Calcium 8.1 (L) 8.9 - 10.3 mg/dL   GFR, Estimated >32 >44 mL/min   Anion gap 8 5 - 15  CBC     Status: Abnormal   Collection Time: 05/21/23  5:02 AM  Result Value Ref Range   WBC 11.2 (H) 4.0 - 10.5 K/uL   RBC 3.42 (L) 4.22 - 5.81 MIL/uL   Hemoglobin 8.5 (L) 13.0 - 17.0 g/dL   HCT 01.0 (L) 27.2 - 53.6 %   MCV 77.5 (L) 80.0 - 100.0 fL   MCH 24.9 (L) 26.0 - 34.0 pg   MCHC 32.1 30.0 - 36.0 g/dL   RDW 64.4 (H) 03.4 - 74.2 %   Platelets 287 150 - 400 K/uL   nRBC 0.0 0.0 - 0.2 %  HIV Antibody (routine testing w rflx)     Status: None   Collection Time: 05/21/23  5:02 AM  Result Value Ref Range   HIV Screen 4th Generation wRfx Non Reactive Non Reactive  Hemoglobin and hematocrit, blood     Status: Abnormal   Collection Time: 05/21/23  2:59 PM  Result Value Ref Range   Hemoglobin 8.5 (L) 13.0 - 17.0 g/dL   HCT 59.5 (L) 63.8 - 75.6 %    I have reviewed pertinent nursing notes, vitals, labs, and images as necessary. I have ordered labwork to follow up on as indicated.  I have reviewed the last notes from staff over past 24 hours. I have discussed patient's care plan and test results with nursing staff, CM/SW, and other staff as appropriate.  Time spent: Greater than 50% of the 55 minute visit was spent in counseling/coordination of care for the patient as laid out in the A&P.   LOS: 1 day   Lewie Chamber, MD [facility] 05/21/2023, 3:45 PM

## 2023-05-21 NOTE — Consult Note (Addendum)
OVE AMAT [birth year]  [id number].    Requesting MD: Dr. 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 He has been having bloating with N/V intermittently for at least the last 6 months.  He states he will get bloated like he is pregnant and then things will eventually shift and improve.  He admits to some coffee-ground emesis from time to time as well.  He was treated on protonix and carafate, but has since stopped and switched to omeprazole BID which he is compliant with.  He follows with GI in [place].  His last EGD was in 2023 by Dr. Levon Hedger.  He was tested for H. Pylori last year and this was negative.  He no longer takes NSAIDs, but did used to take Goody's powder type medications in the past prior to his ulcers being identified.  He has been noted to have a presenting hgb of 4.5 here yesterday.  He was admitted and transfused 4 units of pRBCs.  He underwent an EGD today that reveals multiple duodenal, non-bleeding, clean based ulcers with stenosis that was not traversed.  He had about 1L of gastric contents present that was suctioned out.  He has an UGI pending.  We have been asked to see him regarding this area of duodenal stenosis.  ROS: ROS: Please see HPI  Family History  Problem Relation Age of Onset   Heart disease Other    Arthritis Other    Anesthesia problems Neg Hx    Hypotension Neg Hx    Malignant hyperthermia Neg Hx    Pseudochol deficiency Neg Hx     Past Medical History:  Diagnosis Date   Abdominal pain, chronic, epigastric    Anxiety    Arthritis    Back pain    Complication of anesthesia    had a syncopal episode  after rhinoplasty   Depression    GERD (gastroesophageal reflux disease)    GI bleed    Headache    History of kidney stones    Hypertension    Neurogenic pain    Peptic ulcer disease    PTSD (post-traumatic stress disorder)    Seizures (HCC)    Seizures (HCC)     Past Surgical History:  Procedure Laterality Date   BIOPSY  06/25/2018   Procedure: BIOPSY;  Surgeon: Malissa Hippo, MD;  [facility];  Service: Endoscopy;;  gastric   ESOPHAGOGASTRODUODENOSCOPY (EGD) WITH PROPOFOL N/A 06/25/2018   Procedure: ESOPHAGOGASTRODUODENOSCOPY (EGD) WITH PROPOFOL;  Surgeon: Malissa Hippo, MD;  Location: [facility];  Service: Endoscopy;  Laterality: N/A;   ESOPHAGOGASTRODUODENOSCOPY (EGD) WITH PROPOFOL N/A 05/13/2022   Procedure: ESOPHAGOGASTRODUODENOSCOPY (EGD) WITH PROPOFOL;  Surgeon: Dolores Frame, MD;  Location: [facility];  Service: Gastroenterology;  Laterality: N/A;   NOSE SURGERY     [facility]   ORIF HUMERUS FRACTURE  04/26/2012   Procedure: OPEN REDUCTION INTERNAL FIXATION (ORIF) PROXIMAL HUMERUS FRACTURE;  Surgeon: Vickki Hearing, MD;  Location: [facility];  Service: Orthopedics;  Laterality: Left;  Open Reduction Internal Fixation of Left Proximal Humerus Fracture, Closing Wedge Osteotomy, Bone Graft   RHINOPLASTY  [facility]   SHOULDER SURGERY      Social History:  reports that he has quit smoking. His smoking use included e-cigarettes. He has a 3.50 pack-year smoking history. He has never used smokeless tobacco. He reports that he does not currently use drugs after having used the following drugs: Marijuana. He reports that he does not drink alcohol.  Allergies:  Allergies  Allergen Reactions   Tramadol Other (See Comments)    SEIZURES   Nsaids Other (See Comments)    Has caused ulcers and needs to avoid    Medications Prior to Admission  Medication Sig Dispense Refill   acetaminophen (TYLENOL) 500 MG tablet Take 500-1,000 mg by mouth every 6 (six) hours  as needed (for headaches).     buprenorphine (SUBUTEX) 8 MG SUBL SL tablet Place 1 tablet (8 mg total) under the tongue 3 (three) times daily. 90 tablet 0   EMETROL 230 MG CHEW Chew 460 mg by mouth 2 (two) times daily as needed (for nausea).     EXCEDRIN TENSION HEADACHE 500-65 MG TABS per tablet Take 1 tablet by mouth every 6 (six) hours as needed (for headaches).     lamoTRIgine (LAMICTAL) 200 MG tablet Take 1 tablet (200 mg total) by mouth daily. (Patient taking differently: Take 200 mg by mouth at bedtime.) 90 tablet 0   PRILOSEC OTC 20 MG tablet Take 20 mg by mouth 2 (two) times daily before a meal.     QUEtiapine (SEROQUEL) 25 MG tablet Take 1 tablet (25 mg total) by mouth daily as needed for insomnia. 30 tablet 0   busPIRone (BUSPAR) 30 MG tablet Take 1 tablet (30 mg total) by mouth 2 (two) times daily with food (Patient not taking: Reported on 05/20/2023) 180 tablet 0   escitalopram (LEXAPRO) 10 MG tablet Take 1 tablet by mouth daily (Patient not taking: Reported on 05/20/2023) 90 tablet 0   LORazepam (ATIVAN) 0.5 MG tablet Take 1 tablet (0.5 mg total) by mouth daily as needed for severe anxiety (must last 30 days per md) (Patient not taking: Reported on 05/20/2023) 10 tablet 0   prazosin (MINIPRESS) 2 MG capsule Take 1 capsule (2 mg total) by mouth at bedtime. (Patient not taking: Reported on 05/20/2023) 30 capsule 0     Physical Exam: Blood pressure (!) 119/51, pulse 71, temperature 98.3 F (36.8 C), temperature source Temporal, resp. rate 11, height 5\' 8"  (1.727 m), weight 61.2 kg, SpO2 98 %. General: pleasant, WD, WN white male who is laying in bed in NAD HEENT: head is normocephalic, atraumatic.  Sclera are noninjected.  PERRL.  Ears and nose without any masses or lesions.  Mouth is pink and moist Heart: regular, rate, and rhythm.  Normal s1,s2. No obvious murmurs, gallops, or rubs noted.   Lungs: CTAB, no wheezes, rhonchi, or rales noted.  Respiratory effort nonlabored Abd: soft, NT, ND,  +BS, no masses, hernias, or organomegaly MS: all 4 extremities are symmetrical with no cyanosis, clubbing, or edema.  He has a large scar on his left upper arm from previous ORIF Psych: A&Ox3 with an appropriate affect.  Results for orders placed or performed during the hospital encounter of 05/20/23 (from the past 48 hour(s))  Comprehensive metabolic panel     Status: Abnormal   Collection Time: 05/20/23 11:37 AM  Result Value Ref Range   Sodium 135 135 - 145 mmol/L   Potassium 4.1 3.5 - 5.1 mmol/L   Chloride 100 98 - 111 mmol/L   CO2 20 (  L) 22 - 32 mmol/L   Glucose, Bld 119 (H) 70 - 99 mg/dL    Comment: Glucose reference range applies only to samples taken after fasting for at least 8 hours.   BUN 30 (H) 6 - 20 mg/dL   Creatinine, Ser 1.61 0.61 - 1.24 mg/dL   Calcium 8.9 8.9 - 09.6 mg/dL   Total Protein 8.1 6.5 - 8.1 g/dL   Albumin 4.0 3.5 - 5.0 g/dL   AST 19 15 - 41 U/L   ALT 12 0 - 44 U/L   Alkaline Phosphatase 40 38 - 126 U/L   Total Bilirubin 0.4 0.3 - 1.2 mg/dL   GFR, Estimated >04 >54 mL/min    Comment: (NOTE) Calculated using the CKD-EPI Creatinine Equation (2021)    Anion gap 15 5 - 15    Comment: Performed at [facility], [address], [place], [place] [address]  CBC with Differential     Status: Abnormal   Collection Time: 05/20/23 11:37 AM  Result Value Ref Range   WBC 19.3 (H) 4.0 - 10.5 K/uL   RBC 2.32 (L) 4.22 - 5.81 MIL/uL   Hemoglobin 4.5 (LL) 13.0 - 17.0 g/dL    Comment: REPEATED TO VERIFY Reticulocyte Hemoglobin testing may be clinically indicated, consider ordering this additional test [id number] THIS CRITICAL RESULT HAS VERIFIED AND BEEN CALLED TO CORTEZ,E RN BY GOLSON,M ON 06 05 2024 AT 1206, AND HAS BEEN READ BACK.     HCT 16.8 (L) 39.0 - 52.0 %   MCV 72.4 (L) 80.0 - 100.0 fL   MCH 19.4 (L) 26.0 - 34.0 pg   MCHC 26.8 (L) 30.0 - 36.0 g/dL   RDW 62.1 (H) 30.8 - 65.7 %   Platelets 421 (H) 150 - 400 K/uL   nRBC 0.0 0.0 - 0.2 %    Neutrophils Relative % 92 %   Neutro Abs 17.7 (H) 1.7 - 7.7 K/uL   Lymphocytes Relative 5 %   Lymphs Abs 1.1 0.7 - 4.0 K/uL   Monocytes Relative 2 %   Monocytes Absolute 0.4 0.1 - 1.0 K/uL   Eosinophils Relative 0 %   Eosinophils Absolute 0.0 0.0 - 0.5 K/uL   Basophils Relative 0 %   Basophils Absolute 0.0 0.0 - 0.1 K/uL   Immature Granulocytes 1 %   Abs Immature Granulocytes 0.13 (H) 0.00 - 0.07 K/uL    Comment: Performed at [facility], [address] [place], [place] [address]  Type and screen [facility]     Status: None (Preliminary result)   Collection Time: 05/20/23 11:37 AM  Result Value Ref Range   ABO/RH(D) A POS    Antibody Screen NEG    Sample Expiration 05/23/2023,2359    Unit Number [id number]    Blood Component Type RED CELLS,LR    Unit division 00    Status of Unit ISSUED,FINAL    Transfusion Status OK TO TRANSFUSE    Crossmatch Result      Compatible Performed at [facility], [address] [place], [place] [address]    Unit Number [id number]    Blood Component Type RBC LR PHER1    Unit division 00    Status of Unit ISSUED    Transfusion Status OK TO TRANSFUSE    Crossmatch Result Compatible    Unit Number [id number]    Blood Component Type RED CELLS,LR    Unit division 00    Status of Unit ISSUED,FINAL  Transfusion Status OK TO TRANSFUSE    Crossmatch Result Compatible    Unit Number [id number]    Blood Component Type RED CELLS,LR    Unit division 00    Status of Unit ISSUED,FINAL    Transfusion Status OK TO TRANSFUSE    Crossmatch Result Compatible   Prepare RBC (crossmatch)     Status: None   Collection Time: 05/20/23 12:09 PM  Result Value Ref Range   Order Confirmation      ORDER PROCESSED BY BLOOD BANK Performed at [facility], [address], [place], [place] [address]   Basic metabolic panel     Status: Abnormal   Collection Time: 05/21/23  5:02  AM  Result Value Ref Range   Sodium 131 (L) 135 - 145 mmol/L   Potassium 3.4 (L) 3.5 - 5.1 mmol/L   Chloride 102 98 - 111 mmol/L   CO2 21 (L) 22 - 32 mmol/L   Glucose, Bld 103 (H) 70 - 99 mg/dL    Comment: Glucose reference range applies only to samples taken after fasting for at least 8 hours.   BUN 19 6 - 20 mg/dL   Creatinine, Ser 9.52 0.61 - 1.24 mg/dL   Calcium 8.1 (L) 8.9 - 10.3 mg/dL   GFR, Estimated >84 >13 mL/min    Comment: (NOTE) Calculated using the CKD-EPI Creatinine Equation (2021)    Anion gap 8 5 - 15    Comment: Performed at [facility], [address] [place], [place] [address]  CBC     Status: Abnormal   Collection Time: 05/21/23  5:02 AM  Result Value Ref Range   WBC 11.2 (H) 4.0 - 10.5 K/uL   RBC 3.42 (L) 4.22 - 5.81 MIL/uL   Hemoglobin 8.5 (L) 13.0 - 17.0 g/dL    Comment: REPEATED TO VERIFY POST TRANSFUSION SPECIMEN Reticulocyte Hemoglobin testing may be clinically indicated, consider ordering this additional test [id number]    HCT 26.5 (L) 39.0 - 52.0 %   MCV 77.5 (L) 80.0 - 100.0 fL   MCH 24.9 (L) 26.0 - 34.0 pg   MCHC 32.1 30.0 - 36.0 g/dL   RDW 44.0 (H) 34.7 - 42.5 %   Platelets 287 150 - 400 K/uL   nRBC 0.0 0.0 - 0.2 %    Comment: Performed at [facility], [address], [place], [place] [address]  HIV Antibody (routine testing w rflx)     Status: None   Collection Time: 05/21/23  5:02 AM  Result Value Ref Range   HIV Screen 4th Generation wRfx Non Reactive Non Reactive    Comment: Performed at [facility], [address], [place], [place] [address]   No results found.    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  He appears to have a stricture in his duodenum secondary to chronic PUD, refractory to treatment with protonix and carafate  initially and then subsequently omeprazole.  He has an UGI/SBFT currently pending to further evaluate this area of narrowing.  We will monitor for these results and determine if he will need surgical intervention acutely once this is complete.  If he does have a small opening, we could try FLD/Ensure type diet to boost his nutrition and allow him to follow up with foregut surgeon, but it is possible if this area is too narrowed, he may  require something while here.  Discussed with patient and his mother at bedside.  Continue PPI therapy.  We will follow with you.   FEN - NPO/IVFs VTE - on hold due to bleeding ID - none currently needed  Chronic anemia secondary GI bleed - tx as needed PTSD Seizures - hasn't had one in many years, takes no meds for this Chronic pain - on suboxone  I reviewed Consultant GI notes, hospitalist notes, last 24 h vitals and pain scores, last 48 h intake and output, last 24 h labs and trends, and last 24 h imaging results.  Letha Cape, [facility] 05/21/2023, 2:41 PM Please see Amion for pager number during day hours 7:00am-4:30pm or 7:00am -11:30am on weekends

## 2023-05-21 NOTE — Interval H&P Note (Signed)
History and Physical Interval Note:  05/21/2023 1:32 PM  Alan Jennings  has presented today for surgery, with the diagnosis of Acute blood loss anemia, melena, history duodenal ulcer.  The various methods of treatment have been discussed with the patient and family. After consideration of risks, benefits and other options for treatment, the patient has consented to  Procedure(s): ESOPHAGOGASTRODUODENOSCOPY (EGD) (N/A) as a surgical intervention.  The patient's history has been reviewed, patient examined, no change in status, stable for surgery.  I have reviewed the patient's chart and labs.  Questions were answered to the patient's satisfaction.     Lynann Bologna

## 2023-05-22 ENCOUNTER — Other Ambulatory Visit (HOSPITAL_COMMUNITY): Payer: Self-pay

## 2023-05-22 ENCOUNTER — Inpatient Hospital Stay (HOSPITAL_COMMUNITY): Payer: Medicaid Other

## 2023-05-22 DIAGNOSIS — K315 Obstruction of duodenum: Secondary | ICD-10-CM

## 2023-05-22 DIAGNOSIS — K922 Gastrointestinal hemorrhage, unspecified: Secondary | ICD-10-CM | POA: Diagnosis not present

## 2023-05-22 LAB — BASIC METABOLIC PANEL
Anion gap: 9 (ref 5–15)
BUN: 17 mg/dL (ref 6–20)
CO2: 24 mmol/L (ref 22–32)
Calcium: 8.4 mg/dL — ABNORMAL LOW (ref 8.9–10.3)
Chloride: 104 mmol/L (ref 98–111)
Creatinine, Ser: 1.05 mg/dL (ref 0.61–1.24)
GFR, Estimated: >60 mL/min (ref >60)
Glucose, Bld: 91 mg/dL (ref 70–99)
Potassium: 3.2 mmol/L — ABNORMAL LOW (ref 3.5–5.1)
Sodium: 137 mmol/L (ref 135–145)

## 2023-05-22 LAB — TYPE AND SCREEN
Antibody Screen: NEGATIVE
Unit division: 0
Unit division: 0
Unit division: 0

## 2023-05-22 LAB — MAGNESIUM: Magnesium: 2.3 mg/dL (ref 1.7–2.4)

## 2023-05-22 LAB — BPAM RBC
Blood Product Expiration Date: 202406292359
ISSUE DATE / TIME: 202406052036
ISSUE DATE / TIME: 202406060005
Unit Type and Rh: 6200

## 2023-05-22 LAB — HEMOGLOBIN AND HEMATOCRIT, BLOOD
HCT: 27.7 % — ABNORMAL LOW (ref 39.0–52.0)
Hemoglobin: 8.8 g/dL — ABNORMAL LOW (ref 13.0–17.0)

## 2023-05-22 MED ORDER — POTASSIUM CHLORIDE 10 MEQ/100ML IV SOLN
10.0000 meq | INTRAVENOUS | Status: AC
  Administered 2023-05-22 (×2): 10 meq via INTRAVENOUS
  Filled 2023-05-22 (×3): qty 100

## 2023-05-22 MED ORDER — SUCRALFATE 1 G PO TABS
1.0000 g | ORAL_TABLET | Freq: Four times a day (QID) | ORAL | 3 refills | Status: DC
  Filled 2023-05-22: qty 120, 30d supply, fill #0

## 2023-05-22 MED ORDER — PANTOPRAZOLE SODIUM 40 MG PO TBEC
40.0000 mg | DELAYED_RELEASE_TABLET | Freq: Every day | ORAL | 3 refills | Status: DC
  Filled 2023-05-22: qty 60, 60d supply, fill #0

## 2023-05-22 NOTE — Assessment & Plan Note (Signed)
-   Noted on small bowel follow-through performed on 05/22/2023.  Significant duodenal stricture appreciated but contrast does pass through - Patient anxious for discharge and not wishing to remain in the hospital; he was therefore discharged with plans for outpatient follow-up with GI and surgery - Patient recommended to continue on soft diet with avoiding large pieces of food/meat at discharge

## 2023-05-22 NOTE — Progress Notes (Signed)
[facility] Progress Note  SUBJECTIVE:   Interval history: Alan Jennings was seen and evaluated today at bedside.  His mother was present at bedside today.  Noted having some continued abdominal pain.  No nausea or vomiting.  No bowel movement since presentation.  Past Medical History:  Diagnosis Date   Abdominal pain, chronic, epigastric    Anxiety    Arthritis    Back pain    Complication of anesthesia    had a syncopal episode after rhinoplasty   Depression    GERD (gastroesophageal reflux disease)    GI bleed    Headache    History of kidney stones    Hypertension    Neurogenic pain    Peptic ulcer disease    PTSD (post-traumatic stress disorder)    Seizures (HCC)    Seizures (HCC)    Past Surgical History:  Procedure Laterality Date   BIOPSY  06/25/2018   Procedure: BIOPSY;  Surgeon: Malissa Hippo, MD;  Location: [facility];  Service: Endoscopy;;  gastric   ESOPHAGOGASTRODUODENOSCOPY (EGD) WITH PROPOFOL N/A 06/25/2018   Procedure: ESOPHAGOGASTRODUODENOSCOPY (EGD) WITH PROPOFOL;  Surgeon: Malissa Hippo, MD;  Location: [facility];  Service: Endoscopy;  Laterality: N/A;   ESOPHAGOGASTRODUODENOSCOPY (EGD) WITH PROPOFOL N/A 05/13/2022   Procedure: ESOPHAGOGASTRODUODENOSCOPY (EGD) WITH PROPOFOL;  Surgeon: Dolores Frame, MD;  Location: [facility];  Service: Gastroenterology;  Laterality: N/A;   NOSE SURGERY     [facility]   ORIF HUMERUS FRACTURE  04/26/2012   Procedure: OPEN REDUCTION INTERNAL FIXATION (ORIF) PROXIMAL HUMERUS FRACTURE;  Surgeon: Vickki Hearing, MD;  Location: [facility];  Service: Orthopedics;  Laterality: Left;  Open Reduction Internal Fixation of Left Proximal Humerus Fracture, Closing Wedge Osteotomy, Bone Graft   RHINOPLASTY     [facility]   SHOULDER SURGERY     Current Facility-Administered Medications  Medication Dose Route Frequency Provider Last Rate Last Admin   acetaminophen (TYLENOL) tablet 650 mg  650 mg Oral Q6H PRN  Lynann Bologna, DO   650 mg at 05/20/23 2106   Or   acetaminophen (TYLENOL) suppository 650 mg  650 mg Rectal Q6H PRN Liliane Shi H, DO       albuterol (PROVENTIL) (2.5 MG/3ML) 0.083% nebulizer solution 2.5 mg  2.5 mg Nebulization Q2H PRN Lynann Bologna, DO       alum & mag hydroxide-simeth (MAALOX/MYLANTA) 200-200-20 MG/5ML suspension 30 mL  30 mL Oral Q6H PRN Lynann Bologna, DO       buprenorphine (SUBUTEX) SL tablet 8 mg  8 mg Sublingual TID Liliane Shi H, DO   8 mg at 05/22/23 1120   lactated ringers infusion   Intravenous Continuous Lewie Chamber, MD 75 mL/hr at 05/22/23 1108 New Bag at 05/22/23 1108   lamoTRIgine (LAMICTAL) tablet 200 mg  200 mg Oral Daily Liliane Shi H, DO   200 mg at 05/22/23 1413   LORazepam (ATIVAN) tablet 0.5 mg  0.5 mg Oral Q4H PRN Liliane Shi H, DO   0.5 mg at 05/22/23 1119   metoprolol tartrate (LOPRESSOR) injection 5 mg  5 mg Intravenous Q6H PRN Liliane Shi H, DO       ondansetron (ZOFRAN) tablet 4 mg  4 mg Oral Q6H PRN Liliane Shi H, DO       Or   ondansetron (ZOFRAN) injection 4 mg  4 mg Intravenous Q6H PRN Liliane Shi H, DO       polyethylene glycol (MIRALAX / GLYCOLAX) packet 17  g  17 g Oral Daily PRN Liliane Shi H, DO       QUEtiapine (SEROQUEL) tablet 25 mg  25 mg Oral QHS PRN Lynann Bologna, DO       traZODone (DESYREL) tablet 25 mg  25 mg Oral QHS PRN Lynann Bologna, DO       Allergies as of 05/20/2023 - Review Complete 05/20/2023  Allergen Reaction Noted   Tramadol Other (See Comments)    Nsaids Other (See Comments) 09/19/2018   Review of Systems:  Review of Systems  Respiratory:  Negative for shortness of breath.   Cardiovascular:  Negative for chest pain.  Gastrointestinal:  Positive for abdominal pain. Negative for nausea and vomiting.    OBJECTIVE:   Temp:  [98.3 F (36.8 C)-99.6 F (37.6 C)] 99 F (37.2 C) (06/07 1252) Pulse Rate:  [78-91] 91 (06/07 1252) Resp:  [20] 20 (06/07  1252) BP: (120-126)/(65-72) 122/65 (06/07 1252) SpO2:  [97 %] 97 % (06/07 1252) Last BM Date : 05/20/23 Physical Exam Constitutional:      General: He is not in acute distress.    Appearance: He is not ill-appearing, toxic-appearing or diaphoretic.  Cardiovascular:     Rate and Rhythm: Normal rate and regular rhythm.  Pulmonary:     Effort: No respiratory distress.     Breath sounds: Normal breath sounds.  Abdominal:     General: Bowel sounds are normal. There is no distension.     Palpations: Abdomen is soft.     Tenderness: There is abdominal tenderness. There is no guarding.  Neurological:     Mental Status: He is alert.     Labs: Recent Labs    05/20/23 1137 05/21/23 0502 05/21/23 1459 05/21/23 2116 05/22/23 0515  WBC 19.3* 11.2*  --   --   --   HGB 4.5* 8.5* 8.5* 8.7* 8.8*  HCT 16.8* 26.5* 27.1* 27.6* 27.7*  PLT 421* 287  --   --   --    BMET Recent Labs    05/20/23 1137 05/21/23 0502 05/22/23 0515  NA 135 131* 137  K 4.1 3.4* 3.2*  CL 100 102 104  CO2 20* 21* 24  GLUCOSE 119* 103* 91  BUN 30* 19 17  CREATININE 1.06 0.92 1.05  CALCIUM 8.9 8.1* 8.4*   LFT Recent Labs    05/20/23 1137  PROT 8.1  ALBUMIN 4.0  AST 19  ALT 12  ALKPHOS 40  BILITOT 0.4   PT/INR No results for input(s): "LABPROT", "INR" in the last 72 hours. Diagnostic imaging: DG SMALL BOWEL W SINGLE CM (SOL OR THIN BA)  Result Date: 05/22/2023 CLINICAL DATA:  Duodenal stricture EXAM: SMALL BOWEL SERIES COMPARISON:  CT scan of the abdomen and pelvis May 12, 2022 TECHNIQUE: Following ingestion of thin barium, serial small bowel images were obtained including spot views of the terminal ileum. FLUOROSCOPY: Fluoroscopy Time:  1.4 minutes Radiation Exposure Index (if provided by the fluoroscopic device): 54 mGy Number of Acquired Spot Images: 0 FINDINGS: An initial image demonstrated a distended stomach. The patient was given a single cup of barium. At 5 minutes, barium was identified in the  distended stomach. At 15 minutes, the stomach was still distended with barium contrast was now seen within normal caliber distal duodenum and proximal jejunum. The patient was then taken for spot imaging. The stricture appears to be a relatively short in length and involve only the distal bulb/proximal second portion of the duodenum. The patient was then followed  for 4 hours with intermittent imaging. Over multiple images, the jejunum and ileum are normal in caliber. At 2 hours and 50 minutes, contrast was in the ileum. By 4 hours, contrast was identified in the ascending colon. The 2 hour and 50 minute and 4 hour images were obtained in the patient's room. The patient was not brought back for additional spot imaging of the small bowel. On the final 4 hour image, some contrast remained in the stomach. IMPRESSION: The stomach is distended due to the known duodenal stricture. The stricture appears to involve a short segment of proximal duodenum as above. The remainder of the small bowel is normal in caliber. Contrast finely entered the ascending colon on the 4 hour image. Some contrast did remain within the stomach at 4 hours. In summary, there is clearly significant obstruction at the level of the duodenum. However, the obstruction is not complete as contrast does slowly exit the stomach as above. Electronically Signed   By: Gerome Sam III M.D.   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 twice daily) -Recommend oral sucralfate 1 g  p.o. 4 times daily -Recommend soft GI diet at maximum (would avoid any large pieces of food including meats) -Follow-up with [facility], myself, in outpatient setting -Follow-up with general surgery in outpatient setting -Appears to be stable for discharge today   LOS: 2 days   Liliane Shi, [facility]

## 2023-05-22 NOTE — Plan of Care (Signed)

## 2023-05-22 NOTE — Discharge Summary (Signed)
Physician Discharge Summary   Alan Jennings:[id number] DOB: 23-Jul-1981 DOA: 05/20/2023  PCP: Vertis Kelch, NP  Admit date: 05/20/2023 Discharge date: 05/22/2023  Admitted From: Home Disposition:  Home Discharging physician: Lewie Chamber, MD Barriers to discharge: none  Recommendations at discharge: Follow-up with GI and surgery  Discharge Condition: stable CODE STATUS: Full Diet recommendation:  Diet Orders (From admission, onward)     Start     Ordered   05/21/23 1448  Diet NPO time specified  Diet effective now       Comments: Strict NPO.   05/21/23 1447            Hospital Course: [name] is a 42 yo male with PMH PUD, GIB, depression/anxiety, HTN, PTSD, seizures who presented with abdominal pain and coffee-ground emesis. He has remained compliant on PPI outpatient and has continued to avoid NSAID products.  On arrival to the ER, he underwent further workup and was found to have hemoglobin 4.5 g/dL.  He was ordered 4 units PRBC and GI was consulted.  He underwent EGD on 05/21/2023 which showed 2 nonbleeding cratered duodenal ulcers and a duodenal stricture. He then underwent small bowel follow-through after EGD.  It revealed obvious obstruction in the duodenum but did show contrast able to slowly passed through. He was evaluated by general surgery and he will have outpatient follow-ups.  He was anxious for discharge and was continued on Protonix and sucralfate at discharge.  He was recommended to continue on a soft diet and avoiding large pieces of food and meats. Hemoglobin was stable at discharge, 8.8 g/dL.  He also had no further complaints of coffee-ground emesis or other signs of bleeding.  Assessment and Plan: * Upper GI bleed-resolved as of 05/22/2023 - Known history of PUD with history of prior GI bleeds.  Last EGD May 2023 at [facility] showing duodenal stricture and clean-based duodenal ulcer -Hemoglobin 4.5 g/dL on admission.  Received 4 units PRBC with  improvement to 8.5 g/dL - GI following - EGD performed 05/21/2023 showing 2 duodenal ulcers and underlying duodenal stricture -Hemoglobin stable at discharge, 8.8 g/dL - Continued on Protonix and sucralfate at discharge - Outpatient follow-up with GI as well  Duodenal stricture - Noted on small bowel follow-through performed on 05/22/2023.  Significant duodenal stricture appreciated but contrast does pass through - Patient anxious for discharge and not wishing to remain in the hospital; he was therefore discharged with plans for outpatient follow-up with GI and surgery - Patient recommended to continue on soft diet with avoiding large pieces of food/meat at discharge  Anxiety and depression - Continue Ativan; per med rec, not taking BuSpar or Lexapro  HTN (hypertension) - no meds currently - BP controlled  PTSD (post-traumatic stress disorder) - Continue Seroquel  Seizures (HCC) - Continue Lamictal  Gastroesophageal reflux disease without esophagitis - Continue Protonix   The patient's chronic medical conditions were treated accordingly per the patient's home medication regimen except as noted.  On day of discharge, patient was felt deemed stable for discharge. Patient/family member advised to call PCP or come back to ER if needed.   Principal Diagnosis: Upper GI bleed  Discharge Diagnoses: Active Hospital Problems   Diagnosis Date Noted   Duodenal stricture 05/22/2023    Priority: 2.   Anxiety and depression 05/21/2023   HTN (hypertension) 05/12/2022   PTSD (post-traumatic stress disorder) 05/12/2022   Seizures (HCC) 05/12/2022   Gastroesophageal reflux disease without esophagitis 05/13/2018    Resolved Hospital Problems  Diagnosis Date Noted Date Resolved   Upper GI bleed 05/13/2022 05/22/2023    Priority: 1.     Discharge Instructions     Discharge instructions   Complete by: As directed    Continue soft diet and avoid large pieces of food/meat. Follow up with GI  and general surgery.   Increase activity slowly   Complete by: As directed       Allergies as of 05/22/2023       Reactions   Tramadol Other (See Comments)   SEIZURES   Nsaids Other (See Comments)   Has caused ulcers and needs to avoid        Medication List     STOP taking these medications    Excedrin Tension Headache 500-65 MG Tabs per tablet Generic drug: acetaminophen-caffeine   prazosin 2 MG capsule Commonly known as: MINIPRESS   PriLOSEC OTC 20 MG tablet Generic drug: omeprazole       TAKE these medications    acetaminophen 500 MG tablet Commonly known as: TYLENOL Take 500-1,000 mg by mouth every 6 (six) hours as needed (for headaches).   buprenorphine 8 MG Subl SL tablet Commonly known as: SUBUTEX Place 1 tablet (8 mg total) under the tongue 3 (three) times daily.   busPIRone 30 MG tablet Commonly known as: BUSPAR Take 1 tablet (30 mg total) by mouth 2 (two) times daily with food   Emetrol 230 MG Chew Generic drug: Sodium Citrate Dihydrate Chew 460 mg by mouth 2 (two) times daily as needed (for nausea).   escitalopram 10 MG tablet Commonly known as: Lexapro Take 1 tablet by mouth daily   lamoTRIgine 200 MG tablet Commonly known as: LAMICTAL Take 1 tablet (200 mg total) by mouth daily. What changed: when to take this   LORazepam 0.5 MG tablet Commonly known as: ATIVAN Take 1 tablet (0.5 mg total) by mouth daily as needed for severe anxiety (must last 30 days per md)   pantoprazole 40 MG tablet Commonly known as: Protonix Take 1 tablet (40 mg total) by mouth daily.   QUEtiapine 25 MG tablet Commonly known as: SEROquel Take 1 tablet (25 mg total) by mouth daily as needed for insomnia.   sucralfate 1 g tablet Commonly known as: Carafate Take 1 tablet (1 g total) by mouth 4 (four) times daily.        Follow-up Information     Lynann Bologna, DO. Schedule an appointment as soon as possible for a visit in 2 week(s).   Specialty:  Gastroenterology Contact information: [address] [place] [address] [phone]         Emelia Loron, MD. Schedule an appointment as soon as possible for a visit in 2 week(s).   Specialty: General Surgery Contact information: [address] [place] [address] [phone]                Allergies  Allergen Reactions   Tramadol Other (See Comments)    SEIZURES   Nsaids Other (See Comments)    Has caused ulcers and needs to avoid    Consultations: GI General surgery  Procedures: 05/21/2023: EGD  Discharge Exam: BP 122/65 (BP Location: Right Arm)   Pulse 91   Temp 99 F (37.2 C) (Oral)   Resp 20   Ht 5\' 8"  (1.727 m)   Wt 61.2 kg   SpO2 97%   BMI 20.51 kg/m  Physical Exam Constitutional:      General: He  is not in acute distress.    Appearance: Normal appearance.  HENT:     Head: Normocephalic and atraumatic.     Mouth/Throat:     Mouth: Mucous membranes are moist.  Eyes:     Extraocular Movements: Extraocular movements intact.  Cardiovascular:     Rate and Rhythm: Normal rate and regular rhythm.  Pulmonary:     Effort: Pulmonary effort is normal. No respiratory distress.     Breath sounds: Normal breath sounds. No wheezing.  Abdominal:     General: Bowel sounds are normal. There is no distension.     Palpations: Abdomen is soft.     Tenderness: There is no abdominal tenderness.  Musculoskeletal:        General: Normal range of motion.     Cervical back: Normal range of motion and neck supple.  Skin:    General: Skin is warm and dry.  Neurological:     General: No focal deficit present.     Mental Status: He is alert.  Psychiatric:        Mood and Affect: Mood normal.        Behavior: Behavior normal.      The results of significant diagnostics from this hospitalization (including imaging, microbiology, ancillary and laboratory) are listed below for reference.   Microbiology: No results found for  this or any previous visit (from the past 240 hour(s)).   Labs: BNP (last 3 results) No results for input(s): "BNP" in the last 8760 hours. Basic Metabolic Panel: Recent Labs  Lab 05/20/23 1137 05/21/23 0502 05/22/23 0515  NA 135 131* 137  K 4.1 3.4* 3.2*  CL 100 102 104  CO2 20* 21* 24  GLUCOSE 119* 103* 91  BUN 30* 19 17  CREATININE 1.06 0.92 1.05  CALCIUM 8.9 8.1* 8.4*  MG  --   --  2.3   Liver Function Tests: Recent Labs  Lab 05/20/23 1137  AST 19  ALT 12  ALKPHOS 40  BILITOT 0.4  PROT 8.1  ALBUMIN 4.0   No results for input(s): "LIPASE", "AMYLASE" in the last 168 hours. No results for input(s): "AMMONIA" in the last 168 hours. CBC: Recent Labs  Lab 05/20/23 1137 05/21/23 0502 05/21/23 1459 05/21/23 2116 05/22/23 0515  WBC 19.3* 11.2*  --   --   --   NEUTROABS 17.7*  --   --   --   --   HGB 4.5* 8.5* 8.5* 8.7* 8.8*  HCT 16.8* 26.5* 27.1* 27.6* 27.7*  MCV 72.4* 77.5*  --   --   --   PLT 421* 287  --   --   --    Cardiac Enzymes: No results for input(s): "CKTOTAL", "CKMB", "CKMBINDEX", "TROPONINI" in the last 168 hours. BNP: Invalid input(s): "POCBNP" CBG: No results for input(s): "GLUCAP" in the last 168 hours. D-Dimer No results for input(s): "DDIMER" in the last 72 hours. Hgb A1c No results for input(s): "HGBA1C" in the last 72 hours. Lipid Profile No results for input(s): "CHOL", "HDL", "LDLCALC", "TRIG", "CHOLHDL", "LDLDIRECT" in the last 72 hours. Thyroid function studies No results for input(s): "TSH", "T4TOTAL", "T3FREE", "THYROIDAB" in the last 72 hours.  Invalid input(s): "FREET3" Anemia work up No results for input(s): "VITAMINB12", "FOLATE", "FERRITIN", "TIBC", "IRON", "RETICCTPCT" in the last 72 hours. Urinalysis    Component Value Date/Time   COLORURINE COLORLESS (A) 05/16/2019 0611   APPEARANCEUR CLEAR 05/16/2019 0611   LABSPEC 1.003 (L) 05/16/2019 0611   PHURINE 8.0 05/16/2019 1610  GLUCOSEU NEGATIVE 05/16/2019 0611   HGBUR  NEGATIVE 05/16/2019 0611   BILIRUBINUR NEGATIVE 05/16/2019 0611   KETONESUR NEGATIVE 05/16/2019 0611   PROTEINUR NEGATIVE 05/16/2019 0611   UROBILINOGEN 0.2 02/18/2012 1548   NITRITE NEGATIVE 05/16/2019 0611   LEUKOCYTESUR NEGATIVE 05/16/2019 0611   Sepsis Labs Recent Labs  Lab 05/20/23 1137 05/21/23 0502  WBC 19.3* 11.2*   Microbiology No results found for this or any previous visit (from the past 240 hour(s)).  Procedures/Studies: DG SMALL BOWEL W SINGLE CM (SOL OR THIN BA)  Result Date: 05/22/2023 CLINICAL DATA:  Duodenal stricture EXAM: SMALL BOWEL SERIES COMPARISON:  CT scan of the abdomen and pelvis May 12, 2022 TECHNIQUE: Following ingestion of thin barium, serial small bowel images were obtained including spot views of the terminal ileum. FLUOROSCOPY: Fluoroscopy Time:  1.4 minutes Radiation Exposure Index (if provided by the fluoroscopic device): 54 mGy Number of Acquired Spot Images: 0 FINDINGS: An initial image demonstrated a distended stomach. The patient was given a single cup of barium. At 5 minutes, barium was identified in the distended stomach. At 15 minutes, the stomach was still distended with barium contrast was now seen within normal caliber distal duodenum and proximal jejunum. The patient was then taken for spot imaging. The stricture appears to be a relatively short in length and involve only the distal bulb/proximal second portion of the duodenum. The patient was then followed for 4 hours with intermittent imaging. Over multiple images, the jejunum and ileum are normal in caliber. At 2 hours and 50 minutes, contrast was in the ileum. By 4 hours, contrast was identified in the ascending colon. The 2 hour and 50 minute and 4 hour images were obtained in the patient's room. The patient was not brought back for additional spot imaging of the small bowel. On the final 4 hour image, some contrast remained in the stomach. IMPRESSION: The stomach is distended due to the known  duodenal stricture. The stricture appears to involve a short segment of proximal duodenum as above. The remainder of the small bowel is normal in caliber. Contrast finely entered the ascending colon on the 4 hour image. Some contrast did remain within the stomach at 4 hours. In summary, there is clearly significant obstruction at the level of the duodenum. However, the obstruction is not complete as contrast does slowly exit the stomach as above. Electronically Signed   By: Gerome Sam III M.D.   On: 05/22/2023 14:06     Time coordinating discharge: Over 30 minutes    Lewie Chamber, MD  [facility] 05/22/2023, 4:14 PM

## 2023-05-22 NOTE — Progress Notes (Signed)
Patient ID: Alan Jennings, male   DOB: 05/10/1981, 42 y.o.   MRN: [id number] Doing fine, ugi reviewed, I think fine to go home

## 2023-05-25 LAB — SURGICAL PATHOLOGY

## 2023-05-26 ENCOUNTER — Other Ambulatory Visit (HOSPITAL_COMMUNITY): Payer: Self-pay

## 2023-05-26 ENCOUNTER — Encounter (HOSPITAL_COMMUNITY): Payer: Self-pay | Admitting: Internal Medicine

## 2023-05-29 ENCOUNTER — Other Ambulatory Visit: Payer: Self-pay | Admitting: General Surgery

## 2023-05-29 DIAGNOSIS — K311 Adult hypertrophic pyloric stenosis: Secondary | ICD-10-CM

## 2023-05-29 DIAGNOSIS — K269 Duodenal ulcer, unspecified as acute or chronic, without hemorrhage or perforation: Secondary | ICD-10-CM

## 2023-06-04 NOTE — Pre-Procedure Instructions (Signed)
Surgical Instructions    Your procedure is scheduled on June 17, 2023.  Report to [facility] Main Entrance "A" at 10:30 A.M., then check in with the Admitting office.  Call this number if you have problems the morning of surgery:  [phone]  If you have any questions prior to your surgery date call [phone]: Open Monday-Friday 8am-4pm If you experience any cold or flu symptoms such as cough, fever, chills, shortness of breath, etc. between now and your scheduled surgery, please notify us at the above number.     Remember:  Do not eat after midnight the night before your surgery  You may drink clear liquids until 9:30 AM the morning of your surgery.   Clear liquids allowed are: Water, Non-Citrus Juices (without pulp), Carbonated Beverages, Clear Tea, Black Coffee Only (NO MILK, CREAM OR POWDERED CREAMER of any kind), and Gatorade.     Take these medicines the morning of surgery with A SIP OF WATER:  pantoprazole (PROTONIX)   sucralfate (CARAFATE)    May take these medicines IF NEEDED:  acetaminophen (TYLENOL)   LORazepam (ATIVAN)     Please consult with prescribing physician to receive instructions on if/when to stop buprenorphine (SUBUTEX).   As of today, STOP taking any Aspirin (unless otherwise instructed by your surgeon) Aleve, Naproxen, Ibuprofen, Motrin, Advil, Goody's, BC's, all herbal medications, fish oil, and all vitamins.                     Do NOT Smoke (Tobacco/Vaping) for 24 hours prior to your procedure.  If you use a CPAP at night, you may bring your mask/headgear for your overnight stay.   Contacts, glasses, piercing's, hearing aid's, dentures or partials may not be worn into surgery, please bring cases for these belongings.    For patients admitted to the hospital, discharge time will be determined by your treatment team.   Patients discharged the day of surgery will not be allowed to drive home, and someone needs to stay with them for 24  hours.  SURGICAL WAITING ROOM VISITATION Patients having surgery or a procedure may have no more than 2 support people in the waiting area - these visitors may rotate.   Children under the age of 64 must have an adult with them who is not the patient. If the patient needs to stay at the hospital during part of their recovery, the visitor guidelines for inpatient rooms apply. Pre-op nurse will coordinate an appropriate time for 1 support person to accompany patient in pre-op.  This support person may not rotate.   Please refer to the [facility] website for the visitor guidelines for Inpatients (after your surgery is over and you are in a regular room).    Special instructions:   [facility]- Preparing For Surgery  Before surgery, you can play an important role. Because skin is not sterile, your skin needs to be as free of germs as possible. You can reduce the number of germs on your skin by washing with CHG (chlorahexidine gluconate) Soap before surgery.  CHG is an antiseptic cleaner which kills germs and bonds with the skin to continue killing germs even after washing.    Oral Hygiene is also important to reduce your risk of infection.  Remember - BRUSH YOUR TEETH THE MORNING OF SURGERY WITH YOUR REGULAR TOOTHPASTE  Please do not use if you have an allergy to CHG or antibacterial soaps. If your skin becomes reddened/irritated stop using the CHG.  Do not  shave (including legs and underarms) for at least 48 hours prior to first CHG shower. It is OK to shave your face.  Please follow these instructions carefully.   Shower the NIGHT BEFORE SURGERY and the MORNING OF SURGERY  If you chose to wash your hair, wash your hair first as usual with your normal shampoo.  After you shampoo, rinse your hair and body thoroughly to remove the shampoo.  Use CHG Soap as you would any other liquid soap. You can apply CHG directly to the skin and wash gently with a scrungie or a clean washcloth.   Apply  the CHG Soap to your body ONLY FROM THE NECK DOWN.  Do not use on open wounds or open sores. Avoid contact with your eyes, ears, mouth and genitals (private parts). Wash Face and genitals (private parts)  with your normal soap.   Wash thoroughly, paying special attention to the area where your surgery will be performed.  Thoroughly rinse your body with warm water from the neck down.  DO NOT shower/wash with your normal soap after using and rinsing off the CHG Soap.  Pat yourself dry with a CLEAN TOWEL.  Wear CLEAN PAJAMAS to bed the night before surgery  Place CLEAN SHEETS on your bed the night before your surgery  DO NOT SLEEP WITH PETS.   Day of Surgery: Take a shower with CHG soap. Do not wear jewelry or makeup Do not wear lotions, powders, perfumes/colognes, or deodorant. Do not shave 48 hours prior to surgery.  Men may shave face and neck. Do not bring valuables to the hospital.  [facility] is not responsible for any belongings or valuables. Do not wear nail polish, gel polish, artificial nails, or any other type of covering on natural nails (fingers and toes) If you have artificial nails or gel coating that need to be removed by a nail salon, please have this removed prior to surgery. Artificial nails or gel coating may interfere with anesthesia's ability to adequately monitor your vital signs.  Wear Clean/Comfortable clothing the morning of surgery Remember to brush your teeth WITH YOUR REGULAR TOOTHPASTE.   Please read over the following fact sheets that you were given.    If you received a COVID test during your pre-op visit  it is requested that you wear a mask when out in public, stay away from anyone that may not be feeling well and notify your surgeon if you develop symptoms. If you have been in contact with anyone that has tested positive in the last 10 days please notify you surgeon.

## 2023-06-05 ENCOUNTER — Encounter (HOSPITAL_COMMUNITY)
Admission: RE | Admit: 2023-06-05 | Discharge: 2023-06-05 | Disposition: A | Discharge status: Home / Self Care | Payer: Medicaid Other | Source: Ambulatory Visit | Attending: General Surgery | Admitting: General Surgery

## 2023-06-05 ENCOUNTER — Other Ambulatory Visit: Payer: Self-pay

## 2023-06-05 ENCOUNTER — Encounter (HOSPITAL_COMMUNITY): Payer: Self-pay

## 2023-06-05 VITALS — BP 111/67 | HR 59 | Temp 98.4°F | Resp 20 | Ht 68.0 in | Wt 132.6 lb

## 2023-06-05 DIAGNOSIS — I10 Essential (primary) hypertension: Secondary | ICD-10-CM | POA: Diagnosis not present

## 2023-06-05 DIAGNOSIS — Z01812 Encounter for preprocedural laboratory examination: Secondary | ICD-10-CM | POA: Diagnosis present

## 2023-06-05 DIAGNOSIS — K269 Duodenal ulcer, unspecified as acute or chronic, without hemorrhage or perforation: Secondary | ICD-10-CM

## 2023-06-05 LAB — BASIC METABOLIC PANEL
Anion gap: 6 (ref 5–15)
BUN: 9 mg/dL (ref 6–20)
CO2: 25 mmol/L (ref 22–32)
Calcium: 9.2 mg/dL (ref 8.9–10.3)
Chloride: 104 mmol/L (ref 98–111)
Creatinine, Ser: 0.89 mg/dL (ref 0.61–1.24)
GFR, Estimated: >60 mL/min (ref >60)
Glucose, Bld: 89 mg/dL (ref 70–99)
Potassium: 4 mmol/L (ref 3.5–5.1)
Sodium: 135 mmol/L (ref 135–145)

## 2023-06-05 LAB — TYPE AND SCREEN
ABO/RH(D): A POS
Antibody Screen: NEGATIVE

## 2023-06-05 LAB — CBC
HCT: 29.6 % — ABNORMAL LOW (ref 39.0–52.0)
Hemoglobin: 8.8 g/dL — ABNORMAL LOW (ref 13.0–17.0)
MCH: 23.5 pg — ABNORMAL LOW (ref 26.0–34.0)
MCHC: 29.7 g/dL — ABNORMAL LOW (ref 30.0–36.0)
MCV: 79.1 fL — ABNORMAL LOW (ref 80.0–100.0)
Platelets: 361 10*3/uL (ref 150–400)
RBC: 3.74 MIL/uL — ABNORMAL LOW (ref 4.22–5.81)
RDW: 17.5 % — ABNORMAL HIGH (ref 11.5–15.5)
WBC: 5.6 10*3/uL (ref 4.0–10.5)
nRBC: 0 % (ref 0.0–0.2)

## 2023-06-05 NOTE — Progress Notes (Signed)
PCP - [facility] Vertis Kelch NP Cardiologist - Denies  PPM/ICD - Denies   Chest x-ray - N/A EKG - 05/20/23 Stress Test - Denies ECHO - Denies Cardiac Cath - Denies  Sleep Study - Denies  DM - Denies  Blood Thinner Instructions:N/A Aspirin Instructions:N/A  ERAS Protcol -Yes  COVID TEST- N/I   Anesthesia review: Yes Recent hospital admission for blood transfusion   Patient denies shortness of breath, fever, cough and chest pain at PAT appointment   All instructions explained to the patient, with a verbal understanding of the material. Patient agrees to go over the instructions while at home for a better understanding. The opportunity to ask questions was provided.

## 2023-06-09 ENCOUNTER — Other Ambulatory Visit (HOSPITAL_COMMUNITY): Payer: Self-pay

## 2023-06-09 MED ORDER — BUSPIRONE HCL 30 MG PO TABS
30.0000 mg | ORAL_TABLET | Freq: Two times a day (BID) | ORAL | 0 refills | Status: DC
  Filled 2023-06-09: qty 180, 90d supply, fill #0

## 2023-06-09 MED ORDER — LAMOTRIGINE 200 MG PO TABS
200.0000 mg | ORAL_TABLET | Freq: Every day | ORAL | 0 refills | Status: DC
  Filled 2023-06-09: qty 90, 90d supply, fill #0

## 2023-06-09 MED ORDER — PRAZOSIN HCL 2 MG PO CAPS
2.0000 mg | ORAL_CAPSULE | Freq: Every evening | ORAL | 0 refills | Status: DC
  Filled 2023-06-09: qty 30, 30d supply, fill #0

## 2023-06-09 MED ORDER — QUETIAPINE FUMARATE 25 MG PO TABS
25.0000 mg | ORAL_TABLET | Freq: Every day | ORAL | 0 refills | Status: DC | PRN
  Filled 2023-06-09: qty 30, 30d supply, fill #0

## 2023-06-09 MED ORDER — BUPRENORPHINE HCL 8 MG SL SUBL
SUBLINGUAL_TABLET | SUBLINGUAL | 0 refills | Status: DC
  Filled 2023-06-09: qty 90, 30d supply, fill #0

## 2023-06-16 ENCOUNTER — Encounter (HOSPITAL_COMMUNITY): Payer: Self-pay | Admitting: General Surgery

## 2023-06-16 NOTE — Anesthesia Preprocedure Evaluation (Addendum)
Anesthesia Evaluation  Patient identified by MRN, date of birth, ID band Patient awake    Reviewed: Allergy & Precautions, NPO status , Patient's Chart, lab work & pertinent test results, reviewed documented beta blocker date and time   History of Anesthesia Complications (+) history of anesthetic complications  Airway Mallampati: II  TM Distance: >3 FB Neck ROM: Full    Dental  (+) Poor Dentition, Missing, Dental Advisory Given   Pulmonary Patient abstained from smoking., former smoker   Pulmonary exam normal breath sounds clear to auscultation       Cardiovascular hypertension, Pt. on medications Normal cardiovascular exam Rhythm:Regular Rate:Normal     Neuro/Psych  Headaches, Seizures -, Well Controlled,  PSYCHIATRIC DISORDERS Anxiety Depression    PTSD   GI/Hepatic PUD,GERD  Medicated,,(+)     substance abuse  Opioid use disorder on suboxone Gastric outlet obstruction Duodenal stricture Chronic abdominal pain   Endo/Other  negative endocrine ROS    Renal/GU Renal diseaseHx/o renal calculi  negative genitourinary   Musculoskeletal  (+) Arthritis , Osteoarthritis,  narcotic dependentChronic back pain   Abdominal   Peds  Hematology  (+) Blood dyscrasia, anemia Admitted 05/20/23 [facility] with Hb 4.5 transfused 4u PRBC   Anesthesia Other Findings   Reproductive/Obstetrics                             Anesthesia Physical Anesthesia Plan  ASA: 3  Anesthesia Plan: General   Post-op Pain Management: Dilaudid IV, Lidocaine infusion*, Precedex, Ofirmev IV (intra-op)* and Ketamine IV*   Induction: Intravenous, Cricoid pressure planned and Rapid sequence  PONV Risk Score and Plan: 4 or greater and Treatment may vary due to age or medical condition, Midazolam and Ondansetron  Airway Management Planned: Oral ETT  Additional Equipment: None  Intra-op Plan:   Post-operative Plan: Extubation  in OR  Informed Consent: I have reviewed the patients History and Physical, chart, labs and discussed the procedure including the risks, benefits and alternatives for the proposed anesthesia with the patient or authorized representative who has indicated his/her understanding and acceptance.     Dental advisory given  Plan Discussed with: Anesthesiologist and CRNA  Anesthesia Plan Comments:        Anesthesia Quick Evaluation

## 2023-06-16 NOTE — H&P (Signed)
PROVIDER:  Matthias Hughs, MD Patient Care Team: Launa Grill, NP as PCP - General (Family Medicine) Stechschulte, Gray Bernhardt, MD as Consulting Provider (General Surgery)   MRN: [id number] DOB: 1981-01-03 DATE OF ENCOUNTER: 05/29/2023   Plan Chief Complaint: New Consultation (DU disease - c/o stomach pain off and on.)       History of Present Illness: Alan Jennings is a 42 y.o. male who is seen today for duodenal ulcers/gastric outlet obstruction.   Initial history:    Pt has a history of PUD on omeprazole at home with prior DU bleed requiring GDA embolization at [facility] in 2020, seizures, PTSD, chronic pain on suboxone who has presented to the [facility] 05/20/2023 secondary to syncope with standing along with intermittent melena for the last year. He had been having bloating with N/V intermittently for the entirety of 2024 .  He stated he would get bloated like he was pregnant and then things would eventually shift and improve.  He admitted to some coffee-ground emesis from time to time as well.  He was treated on protonix and carafate, but  stopped and switched to omeprazole BID which he was compliant with.  He follows with GI in [place].  His last EGD was in 2023 by Dr. Levon Hedger.  He was tested for H. Pylori 2023 and this was negative.  He no longer takes NSAIDs, but did used to take Goody's powder type medications in the past prior to his ulcers being identified.   He had a presenting hgb of 4.5 at the ED.  He was admitted and transfused 4 units of pRBCs.  He underwent an EGD today that reveals multiple duodenal, non-bleeding, clean based ulcers with stenosis that was not traversed.  He had about 1L of gastric contents present that was suctioned out.    He had upper GI prior to d/c that showed stricture but did show some passage of contrast slowly and entrance into the colon by 4 hours.     Interval history:    Since the patient's discharge from the hospital last week, he  is keeping down some liquids and some soft foods.  He does state that if he overdoes that he can tell he gets extremely bloated and starts having more reflux.  He is continuing to lose weight.  He is lost at least around 20 pounds over the past 6 months.  He does not see any blood in his stool at this point.       Review of Systems: A complete review of systems was obtained from the patient.  I have reviewed this information and discussed as appropriate with the patient.  See HPI as well for other ROS.   Review of Systems  Constitutional:  Positive for weight loss.  Gastrointestinal:  Positive for abdominal pain and heartburn.  Psychiatric/Behavioral:  Positive for depression. The patient is nervous/anxious.   All other systems reviewed and are negative.       Medical History: Past Medical History      Past Medical History:  Diagnosis Date   Anemia     Anxiety     GERD (gastroesophageal reflux disease)     Hypertension     PTSD (post-traumatic stress disorder)     Seizures (CMS/HHS-HCC)     Substance abuse (CMS/HHS-HCC)          Problem List     Patient Active Problem List  Diagnosis   Gastric outlet obstruction (HHS-HCC)   Gastroesophageal reflux disease without esophagitis  Duodenal stricture (HHS-HCC)   PUD (peptic ulcer disease)        Past Surgical History       Past Surgical History:  Procedure Laterality Date   OPEN REDUCTION INTERNAL FIXATION (ORIF) PROXIMAL HUMERUS FRACTURE (Left: Arm Upper)   04/26/2012    Dr. Mort Sawyers        Allergies       Allergies  Allergen Reactions   Tramadol Other (See Comments)      SEIZURES   Nsaids (Non-Steroidal Anti-Inflammatory Drug) Other (See Comments)      Has caused ulcers and needs to avoid        Medications Ordered Prior to Encounter        Current Outpatient Medications on File Prior to Visit  Medication Sig Dispense Refill   buprenorphine HCL (SUBUTEX) 8 mg SL tablet Place under the tongue        lamoTRIgine (LAMICTAL) 200 MG tablet Take 200 mg by mouth 2 (two) times daily       LORazepam (ATIVAN) 0.5 MG tablet Take 0.5 mg by mouth every 6 (six) hours as needed        No current facility-administered medications on file prior to visit.        Family History  History reviewed. No pertinent family history.      Tobacco Use History  Social History        Tobacco Use  Smoking Status Former   Types: Cigarettes  Smokeless Tobacco Never        Social History  Social History         Socioeconomic History   Marital status: Single  Tobacco Use   Smoking status: Former      Types: Cigarettes   Smokeless tobacco: Never  Substance and Sexual Activity   Alcohol use: Never   Drug use: Never    Social Determinants of Health        Food Insecurity: No Food Insecurity (05/20/2023)    Received from [facility]    Hunger Vital Sign     Worried About Running Out of Food in the Last Year: Never true     Ran Out of Food in the Last Year: Never true  Transportation Needs: No Transportation Needs (05/20/2023)    Received from [facility]     Lack of Transportation (Medical): No     Lack of Transportation (Non-Medical): No        Objective:         Vitals:    05/29/23 0949  BP: 123/79  Pulse: 81  Temp: 37.1 C (98.7 F)  SpO2: 98%  Weight: 58.9 kg (129 lb 12.8 oz)  Height: 172.7 cm (5\' 8" )  PainSc:   5    Body mass index is 19.74 kg/m.   Head:   Normocephalic and atraumatic.  Eyes:    Conjunctivae are normal. Pupils are equal, round, and reactive to light. No scleral icterus.  Neck:   Normal range of motion. Neck supple. No tracheal deviation present. No thyromegaly present.  Resp:No respiratory distress, normal effort. Abd:      Abdomen is soft, non distended and non tender. No masses are palpable.  There is no rebound and no guarding.  Neurological: Alert and oriented to person, place, and time. Coordination normal.  Skin:    Skin is  warm and dry. No rash noted. No diaphoretic. No erythema. No pallor.  Psychiatric: Normal mood and affect. Normal  behavior. Judgment and thought content normal.      Labs, Imaging and Diagnostic Testing:   K 3.2 HCT 8.8, 27.7   Assessment and Plan:    Assessment Diagnoses and all orders for this visit:   Gastric outlet obstruction (HHS-HCC)   Gastroesophageal reflux disease without esophagitis   Duodenal stricture (HHS-HCC)   PUD (peptic ulcer disease)     Patient does have significant gastric outlet obstruction from his stricture.  It is unsurprising that he has developed a stricture given his 4 years of significant duodenal ulcer disease.  I do think he needs an antrectomy.  According to his last EGD the ulcers are in the duodenal bulb, so I do not think any extended duodenal surgery would be required.  Because of the location in the duodenum, the stricture is not amenable to stenting.   Schedule surgery with the patient.  I reviewed the part of the stomach that we would remove with diagrams of anatomy.  I showed him the 2 different ways of reconstruction and that I would attempt a Billroth I reconstruction if there is adequate length on the duodenum and the stomach to make a good connection.  If there is not, I would perform perform a gastrojejunostomy/Billroth II reconstruction.   I discussed if he continues to leave weight that I probably would consider leaving a feeding tube.   I reviewed risks of surgery including bleeding, infection, damage to adjacent structures, heart or lung issues, blood clot, breakdown or leakage of the anastomosis, bleeding at the anastomosis, eventual development of ulcer at the anastomosis, bile acid reflux, and prolonged delayed gastric emptying related to the stricture and atony of the stomach.   I would plan to do this robotically hopefully to minimize his recovery and postoperative pain.  I discussed with him that sometimes it is not possible to get  robotic time for a while and that if that was the case we may need to do it open.   I reviewed postoperative expectations and restrictions.  The patient gets ill with hydrocodone, we will plan to prescribe a few oxycodone postoperatively.  Patient understands and wishes to proceed.     No follow-ups on file.     Matthias Hughs, MD

## 2023-06-17 ENCOUNTER — Other Ambulatory Visit: Payer: Self-pay

## 2023-06-17 ENCOUNTER — Encounter (HOSPITAL_COMMUNITY)
Admission: RE | Disposition: A | Discharge status: Home / Self Care | Payer: Self-pay | Source: Home / Self Care | Attending: General Surgery

## 2023-06-17 ENCOUNTER — Encounter (HOSPITAL_COMMUNITY): Payer: Self-pay | Admitting: General Surgery

## 2023-06-17 ENCOUNTER — Inpatient Hospital Stay (HOSPITAL_COMMUNITY): Payer: Medicaid Other | Admitting: Physician Assistant

## 2023-06-17 ENCOUNTER — Inpatient Hospital Stay (HOSPITAL_COMMUNITY)
Admission: RE | Admit: 2023-06-17 | Discharge: 2023-06-25 | DRG: 327 | Disposition: A | Discharge status: Home / Self Care | Payer: Medicaid Other | Attending: General Surgery | Admitting: General Surgery

## 2023-06-17 ENCOUNTER — Inpatient Hospital Stay (HOSPITAL_COMMUNITY): Payer: Medicaid Other | Admitting: Anesthesiology

## 2023-06-17 DIAGNOSIS — D62 Acute posthemorrhagic anemia: Secondary | ICD-10-CM | POA: Diagnosis not present

## 2023-06-17 DIAGNOSIS — G8929 Other chronic pain: Secondary | ICD-10-CM | POA: Diagnosis present

## 2023-06-17 DIAGNOSIS — I10 Essential (primary) hypertension: Secondary | ICD-10-CM | POA: Diagnosis present

## 2023-06-17 DIAGNOSIS — Z87891 Personal history of nicotine dependence: Secondary | ICD-10-CM | POA: Diagnosis not present

## 2023-06-17 DIAGNOSIS — E871 Hypo-osmolality and hyponatremia: Secondary | ICD-10-CM | POA: Diagnosis not present

## 2023-06-17 DIAGNOSIS — M549 Dorsalgia, unspecified: Secondary | ICD-10-CM | POA: Diagnosis present

## 2023-06-17 DIAGNOSIS — K315 Obstruction of duodenum: Secondary | ICD-10-CM | POA: Diagnosis present

## 2023-06-17 DIAGNOSIS — E876 Hypokalemia: Secondary | ICD-10-CM | POA: Diagnosis not present

## 2023-06-17 DIAGNOSIS — R141 Gas pain: Secondary | ICD-10-CM | POA: Diagnosis not present

## 2023-06-17 DIAGNOSIS — E46 Unspecified protein-calorie malnutrition: Secondary | ICD-10-CM | POA: Diagnosis present

## 2023-06-17 DIAGNOSIS — Z79899 Other long term (current) drug therapy: Secondary | ICD-10-CM

## 2023-06-17 DIAGNOSIS — K219 Gastro-esophageal reflux disease without esophagitis: Secondary | ICD-10-CM | POA: Diagnosis present

## 2023-06-17 DIAGNOSIS — F419 Anxiety disorder, unspecified: Secondary | ICD-10-CM | POA: Diagnosis present

## 2023-06-17 DIAGNOSIS — Z6820 Body mass index (BMI) 20.0-20.9, adult: Secondary | ICD-10-CM | POA: Diagnosis not present

## 2023-06-17 DIAGNOSIS — F4310 Post-traumatic stress disorder, unspecified: Secondary | ICD-10-CM | POA: Diagnosis present

## 2023-06-17 DIAGNOSIS — K311 Adult hypertrophic pyloric stenosis: Secondary | ICD-10-CM | POA: Diagnosis present

## 2023-06-17 DIAGNOSIS — T39395A Adverse effect of other nonsteroidal anti-inflammatory drugs [NSAID], initial encounter: Secondary | ICD-10-CM | POA: Diagnosis present

## 2023-06-17 HISTORY — PX: GASTROSTOMY: SHX5249

## 2023-06-17 LAB — POCT I-STAT, CHEM 8
BUN: 13 mg/dL (ref 6–20)
Calcium, Ion: 1.22 mmol/L (ref 1.15–1.40)
Chloride: 99 mmol/L (ref 98–111)
Creatinine, Ser: 1 mg/dL (ref 0.61–1.24)
Glucose, Bld: 93 mg/dL (ref 70–99)
HCT: 38 % — ABNORMAL LOW (ref 39.0–52.0)
Hemoglobin: 12.9 g/dL — ABNORMAL LOW (ref 13.0–17.0)
Potassium: 3.1 mmol/L — ABNORMAL LOW (ref 3.5–5.1)
Sodium: 137 mmol/L (ref 135–145)
TCO2: 25 mmol/L (ref 22–32)

## 2023-06-17 LAB — TYPE AND SCREEN
ABO/RH(D): A POS
Antibody Screen: NEGATIVE

## 2023-06-17 SURGERY — GASTRECTOMY, PARTIAL, ROBOT-ASSISTED
Anesthesia: General | Site: Abdomen

## 2023-06-17 MED ORDER — FENTANYL CITRATE (PF) 250 MCG/5ML IJ SOLN
INTRAMUSCULAR | Status: AC
  Filled 2023-06-17: qty 5

## 2023-06-17 MED ORDER — METHOCARBAMOL 1000 MG/10ML IJ SOLN
500.0000 mg | Freq: Three times a day (TID) | INTRAVENOUS | Status: DC | PRN
  Administered 2023-06-17 – 2023-06-18 (×2): 500 mg via INTRAVENOUS
  Filled 2023-06-17 (×2): qty 500

## 2023-06-17 MED ORDER — CEFAZOLIN SODIUM-DEXTROSE 2-4 GM/100ML-% IV SOLN
INTRAVENOUS | Status: AC
  Filled 2023-06-17: qty 100

## 2023-06-17 MED ORDER — PROCHLORPERAZINE MALEATE 10 MG PO TABS: 10.0000 mg | ORAL_TABLET | Freq: Four times a day (QID) | ORAL | Status: DC | PRN

## 2023-06-17 MED ORDER — KCL IN DEXTROSE-NACL 20-5-0.45 MEQ/L-%-% IV SOLN
INTRAVENOUS | Status: DC
  Filled 2023-06-17 (×6): qty 1000

## 2023-06-17 MED ORDER — ACETAMINOPHEN 10 MG/ML IV SOLN
1000.0000 mg | Freq: Once | INTRAVENOUS | Status: AC
  Administered 2023-06-17: 1000 mg via INTRAVENOUS

## 2023-06-17 MED ORDER — ROCURONIUM BROMIDE 10 MG/ML (PF) SYRINGE
PREFILLED_SYRINGE | INTRAVENOUS | Status: DC | PRN
  Administered 2023-06-17 (×3): 10 mg via INTRAVENOUS
  Administered 2023-06-17 (×2): 20 mg via INTRAVENOUS
  Administered 2023-06-17: 30 mg via INTRAVENOUS
  Administered 2023-06-17 (×2): 20 mg via INTRAVENOUS
  Administered 2023-06-17: 40 mg via INTRAVENOUS

## 2023-06-17 MED ORDER — HYDROMORPHONE HCL 1 MG/ML IJ SOLN
INTRAMUSCULAR | Status: AC
  Filled 2023-06-17: qty 1

## 2023-06-17 MED ORDER — ORAL CARE MOUTH RINSE: 15.0000 mL | Freq: Once | OROMUCOSAL | Status: AC

## 2023-06-17 MED ORDER — LIDOCAINE HCL 1 % IJ SOLN
INTRAMUSCULAR | Status: AC
  Filled 2023-06-17: qty 20

## 2023-06-17 MED ORDER — ACETAMINOPHEN 10 MG/ML IV SOLN
INTRAVENOUS | Status: AC
  Filled 2023-06-17: qty 100

## 2023-06-17 MED ORDER — SUCCINYLCHOLINE CHLORIDE 200 MG/10ML IV SOSY
PREFILLED_SYRINGE | INTRAVENOUS | Status: DC | PRN
  Administered 2023-06-17: 120 mg via INTRAVENOUS

## 2023-06-17 MED ORDER — BUPIVACAINE LIPOSOME 1.3 % IJ SUSP
INTRAMUSCULAR | Status: AC
  Filled 2023-06-17: qty 20

## 2023-06-17 MED ORDER — DEXMEDETOMIDINE HCL IN NACL 80 MCG/20ML IV SOLN
INTRAVENOUS | Status: DC | PRN
  Administered 2023-06-17 (×3): 10 ug via INTRAVENOUS

## 2023-06-17 MED ORDER — LIDOCAINE IN D5W 4-5 MG/ML-% IV SOLN
INTRAVENOUS | Status: DC | PRN
  Administered 2023-06-17: 2 mg/min via INTRAVENOUS

## 2023-06-17 MED ORDER — LEVETIRACETAM IN NACL 500 MG/100ML IV SOLN
500.0000 mg | Freq: Two times a day (BID) | INTRAVENOUS | Status: DC
  Administered 2023-06-17 – 2023-06-22 (×10): 500 mg via INTRAVENOUS
  Filled 2023-06-17 (×11): qty 100

## 2023-06-17 MED ORDER — ONDANSETRON HCL 4 MG/2ML IJ SOLN
INTRAMUSCULAR | Status: DC | PRN
  Administered 2023-06-17: 4 mg via INTRAVENOUS

## 2023-06-17 MED ORDER — PANTOPRAZOLE SODIUM 40 MG IV SOLR
40.0000 mg | Freq: Every day | INTRAVENOUS | Status: DC
  Administered 2023-06-17 – 2023-06-21 (×5): 40 mg via INTRAVENOUS
  Filled 2023-06-17 (×5): qty 10

## 2023-06-17 MED ORDER — CEFAZOLIN SODIUM-DEXTROSE 2-4 GM/100ML-% IV SOLN
2.0000 g | INTRAVENOUS | Status: AC
  Administered 2023-06-17 (×2): 2 g via INTRAVENOUS

## 2023-06-17 MED ORDER — PROCHLORPERAZINE EDISYLATE 10 MG/2ML IJ SOLN: 5.0000 mg | Freq: Four times a day (QID) | INTRAMUSCULAR | Status: DC | PRN

## 2023-06-17 MED ORDER — HYDROMORPHONE HCL 1 MG/ML IJ SOLN
0.5000 mg | INTRAMUSCULAR | Status: DC | PRN
  Administered 2023-06-17: 1 mg via INTRAVENOUS
  Filled 2023-06-17 (×2): qty 1

## 2023-06-17 MED ORDER — ACETAMINOPHEN 10 MG/ML IV SOLN
1000.0000 mg | Freq: Four times a day (QID) | INTRAVENOUS | Status: AC
  Administered 2023-06-18 (×3): 1000 mg via INTRAVENOUS
  Filled 2023-06-17 (×3): qty 100

## 2023-06-17 MED ORDER — HYDROMORPHONE HCL 1 MG/ML IJ SOLN
2.0000 mg | INTRAMUSCULAR | Status: DC | PRN
  Administered 2023-06-17 – 2023-06-18 (×5): 2 mg via INTRAVENOUS
  Filled 2023-06-17 (×5): qty 2

## 2023-06-17 MED ORDER — ACETAMINOPHEN 500 MG PO TABS: 1000.0000 mg | ORAL_TABLET | ORAL | Status: AC

## 2023-06-17 MED ORDER — DROPERIDOL 2.5 MG/ML IJ SOLN: 0.6250 mg | Freq: Once | INTRAMUSCULAR | Status: DC | PRN

## 2023-06-17 MED ORDER — KETAMINE HCL 50 MG/5ML IJ SOSY
PREFILLED_SYRINGE | INTRAMUSCULAR | Status: AC
  Filled 2023-06-17: qty 5

## 2023-06-17 MED ORDER — MIDAZOLAM HCL 2 MG/2ML IJ SOLN
INTRAMUSCULAR | Status: AC
  Filled 2023-06-17: qty 2

## 2023-06-17 MED ORDER — ONDANSETRON 4 MG PO TBDP: 4.0000 mg | ORAL_TABLET | Freq: Four times a day (QID) | ORAL | Status: DC | PRN

## 2023-06-17 MED ORDER — LACTATED RINGERS IV SOLN: INTRAVENOUS | Status: DC | PRN

## 2023-06-17 MED ORDER — HYDROMORPHONE HCL 1 MG/ML IJ SOLN
INTRAMUSCULAR | Status: DC | PRN
  Administered 2023-06-17 (×2): .5 mg via INTRAVENOUS

## 2023-06-17 MED ORDER — CHLORHEXIDINE GLUCONATE 0.12 % MT SOLN
OROMUCOSAL | Status: AC
  Administered 2023-06-17: 15 mL via OROMUCOSAL
  Filled 2023-06-17: qty 15

## 2023-06-17 MED ORDER — ACETAMINOPHEN 500 MG PO TABS
ORAL_TABLET | ORAL | Status: AC
  Administered 2023-06-17: 1000 mg via ORAL
  Filled 2023-06-17: qty 2

## 2023-06-17 MED ORDER — CHLORHEXIDINE GLUCONATE 0.12 % MT SOLN: 15.0000 mL | Freq: Once | OROMUCOSAL | Status: AC

## 2023-06-17 MED ORDER — LACTATED RINGERS IV SOLN: INTRAVENOUS | Status: DC

## 2023-06-17 MED ORDER — MIDAZOLAM HCL 2 MG/2ML IJ SOLN
INTRAMUSCULAR | Status: DC | PRN
  Administered 2023-06-17: 2 mg via INTRAVENOUS

## 2023-06-17 MED ORDER — BUPRENORPHINE HCL 8 MG SL SUBL
8.0000 mg | SUBLINGUAL_TABLET | Freq: Three times a day (TID) | SUBLINGUAL | Status: DC
  Administered 2023-06-17 – 2023-06-18 (×4): 8 mg via SUBLINGUAL
  Filled 2023-06-17 (×5): qty 1

## 2023-06-17 MED ORDER — HYDROMORPHONE HCL 1 MG/ML IJ SOLN
INTRAMUSCULAR | Status: AC
  Filled 2023-06-17: qty 0.5

## 2023-06-17 MED ORDER — CHLORHEXIDINE GLUCONATE CLOTH 2 % EX PADS: 6.0000 | MEDICATED_PAD | Freq: Once | CUTANEOUS | Status: DC

## 2023-06-17 MED ORDER — LIDOCAINE HCL 1 % IJ SOLN: INTRAMUSCULAR | Status: DC | PRN

## 2023-06-17 MED ORDER — BUPIVACAINE LIPOSOME 1.3 % IJ SUSP: INTRAMUSCULAR | Status: DC | PRN

## 2023-06-17 MED ORDER — LORAZEPAM 2 MG/ML IJ SOLN
1.0000 mg | Freq: Four times a day (QID) | INTRAMUSCULAR | Status: DC | PRN
  Administered 2023-06-18: 1 mg via INTRAVENOUS
  Filled 2023-06-17: qty 1
  Filled 2023-06-17: qty 0.5

## 2023-06-17 MED ORDER — SODIUM CHLORIDE (PF) 0.9 % IJ SOLN: INTRAMUSCULAR | Status: DC | PRN

## 2023-06-17 MED ORDER — CEFAZOLIN SODIUM-DEXTROSE 2-4 GM/100ML-% IV SOLN
2.0000 g | Freq: Three times a day (TID) | INTRAVENOUS | Status: AC
  Administered 2023-06-18: 2 g via INTRAVENOUS
  Filled 2023-06-17: qty 100

## 2023-06-17 MED ORDER — SUGAMMADEX SODIUM 200 MG/2ML IV SOLN
INTRAVENOUS | Status: DC | PRN
  Administered 2023-06-17: 200 mg via INTRAVENOUS

## 2023-06-17 MED ORDER — PROPOFOL 10 MG/ML IV BOLUS
INTRAVENOUS | Status: DC | PRN
  Administered 2023-06-17: 160 ug/kg/min via INTRAVENOUS
  Administered 2023-06-17: 50 ug/kg/min via INTRAVENOUS

## 2023-06-17 MED ORDER — 0.9 % SODIUM CHLORIDE (POUR BTL) OPTIME
TOPICAL | Status: DC | PRN
  Administered 2023-06-17: 1000 mL

## 2023-06-17 MED ORDER — KETAMINE HCL 10 MG/ML IJ SOLN
INTRAMUSCULAR | Status: DC | PRN
  Administered 2023-06-17 (×2): 10 mg via INTRAVENOUS
  Administered 2023-06-17: 30 mg via INTRAVENOUS

## 2023-06-17 MED ORDER — FENTANYL CITRATE (PF) 250 MCG/5ML IJ SOLN
INTRAMUSCULAR | Status: DC | PRN
  Administered 2023-06-17: 50 ug via INTRAVENOUS
  Administered 2023-06-17 (×2): 100 ug via INTRAVENOUS

## 2023-06-17 MED ORDER — BUPIVACAINE-EPINEPHRINE (PF) 0.25% -1:200000 IJ SOLN
INTRAMUSCULAR | Status: AC
  Filled 2023-06-17: qty 30

## 2023-06-17 MED ORDER — HYDROMORPHONE HCL 1 MG/ML IJ SOLN
0.2500 mg | INTRAMUSCULAR | Status: AC | PRN
  Administered 2023-06-17 (×8): 0.5 mg via INTRAVENOUS

## 2023-06-17 MED ORDER — LIDOCAINE 2% (20 MG/ML) 5 ML SYRINGE
INTRAMUSCULAR | Status: DC | PRN
  Administered 2023-06-17: 80 mg via INTRAVENOUS

## 2023-06-17 MED ORDER — INDOCYANINE GREEN 25 MG IV SOLR
INTRAVENOUS | Status: AC
  Filled 2023-06-17: qty 10

## 2023-06-17 MED ORDER — DEXAMETHASONE SODIUM PHOSPHATE 10 MG/ML IJ SOLN
INTRAMUSCULAR | Status: DC | PRN
  Administered 2023-06-17: 8 mg via INTRAVENOUS

## 2023-06-17 MED ORDER — INDOCYANINE GREEN 25 MG IV SOLR
INTRAVENOUS | Status: DC | PRN
  Administered 2023-06-17: 12.5 mg via INTRAVENOUS

## 2023-06-17 MED ORDER — ONDANSETRON HCL 4 MG/2ML IJ SOLN: 4.0000 mg | Freq: Four times a day (QID) | INTRAMUSCULAR | Status: DC | PRN

## 2023-06-17 MED ORDER — ONDANSETRON HCL 4 MG/2ML IJ SOLN: 4.0000 mg | Freq: Once | INTRAMUSCULAR | Status: DC | PRN

## 2023-06-17 MED ORDER — SODIUM CHLORIDE 0.9 % IR SOLN
Status: DC | PRN
  Administered 2023-06-17 (×3): 1000 mL

## 2023-06-17 SURGICAL SUPPLY — 121 items
ADH SKN CLS APL DERMABOND .7 (GAUZE/BANDAGES/DRESSINGS) ×2
APL PRP STRL LF DISP 70% ISPRP (MISCELLANEOUS) ×2
APL SKNCLS STERI-STRIP NONHPOA (GAUZE/BANDAGES/DRESSINGS) ×2
BAG COUNTER SPONGE SURGICOUNT (BAG) IMPLANT
BAG DRN RND TRDRP ANRFLXCHMBR (UROLOGICAL SUPPLIES) ×2
BAG SPEC RTRVL 10 TROC 200 (ENDOMECHANICALS) ×2
BAG SPNG CNTER NS LX DISP (BAG)
BAG URINE DRAIN 2000ML AR STRL (UROLOGICAL SUPPLIES) IMPLANT
BENZOIN TINCTURE PRP APPL 2/3 (GAUZE/BANDAGES/DRESSINGS) IMPLANT
BLADE EXTENDED COATED 6.5IN (ELECTRODE) IMPLANT
CANNULA REDUCER 12-8 DVNC XI (CANNULA) ×2 IMPLANT
CELLS DAT CNTRL 66122 CELL SVR (MISCELLANEOUS) ×2 IMPLANT
CHLORAPREP W/TINT 26 (MISCELLANEOUS) ×2 IMPLANT
CLIP LIGATING HEM O LOK PURPLE (MISCELLANEOUS) IMPLANT
CLIP LIGATING HEMOLOK MED (MISCELLANEOUS) IMPLANT
COVER MAYO STAND STRL (DRAPES) ×2 IMPLANT
COVER SURGICAL LIGHT HANDLE (MISCELLANEOUS) ×2 IMPLANT
COVER TIP SHEARS 8 DVNC (MISCELLANEOUS) IMPLANT
DEFOGGER SCOPE WARMER CLEARIFY (MISCELLANEOUS) ×2 IMPLANT
DERMABOND ADVANCED .7 DNX12 (GAUZE/BANDAGES/DRESSINGS) ×2 IMPLANT
DEVICE TROCAR PUNCTURE CLOSURE (ENDOMECHANICALS) IMPLANT
DRAIN CHANNEL 19F RND (DRAIN) IMPLANT
DRAPE ARM DVNC X/XI (DISPOSABLE) ×8 IMPLANT
DRAPE COLUMN DVNC XI (DISPOSABLE) ×2 IMPLANT
DRAPE CV SPLIT W-CLR ANES SCRN (DRAPES) ×2 IMPLANT
DRAPE ORTHO SPLIT 77X108 STRL (DRAPES) ×2
DRAPE SURG IRRIG POUCH 19X23 (DRAPES) ×2 IMPLANT
DRAPE SURG ORHT 6 SPLT 77X108 (DRAPES) ×2 IMPLANT
DRIVER NDL LRG 8 DVNC XI (INSTRUMENTS) ×2 IMPLANT
DRIVER NDL MEGA SUTCUT DVNCXI (INSTRUMENTS) ×2 IMPLANT
DRIVER NDLE LRG 8 DVNC XI (INSTRUMENTS) ×2 IMPLANT
DRIVER NDLE MEGA SUTCUT DVNCXI (INSTRUMENTS) ×2 IMPLANT
DRSG OPSITE POSTOP 4X6 (GAUZE/BANDAGES/DRESSINGS) IMPLANT
DRSG OPSITE POSTOP 4X8 (GAUZE/BANDAGES/DRESSINGS) IMPLANT
DRSG TEGADERM 2-3/8X2-3/4 SM (GAUZE/BANDAGES/DRESSINGS) IMPLANT
ELECT REM PT RETURN 9FT ADLT (ELECTROSURGICAL) ×2
ELECTRODE REM PT RTRN 9FT ADLT (ELECTROSURGICAL) ×2 IMPLANT
ENDOLOOP SUT PDS II 0 18 (SUTURE) IMPLANT
EVACUATOR SILICONE 100CC (DRAIN) IMPLANT
FORCEPS BPLR FENES DVNC XI (FORCEP) ×2 IMPLANT
FORCEPS CADIERE DVNC XI (FORCEP) ×2 IMPLANT
GAUZE SPONGE 2X2 8PLY STRL LF (GAUZE/BANDAGES/DRESSINGS) IMPLANT
GAUZE SPONGE 4X4 12PLY STRL (GAUZE/BANDAGES/DRESSINGS) IMPLANT
GLOVE BIO SURGEON STRL SZ 6 (GLOVE) ×6 IMPLANT
GLOVE INDICATOR 6.5 STRL GRN (GLOVE) ×6 IMPLANT
GOWN STRL REUS W/ TWL LRG LVL3 (GOWN DISPOSABLE) ×4 IMPLANT
GOWN STRL REUS W/ TWL XL LVL3 (GOWN DISPOSABLE) ×10 IMPLANT
GOWN STRL REUS W/TWL LRG LVL3 (GOWN DISPOSABLE) ×4
GOWN STRL REUS W/TWL XL LVL3 (GOWN DISPOSABLE) ×10
GRASPER TIP-UP FEN DVNC XI (INSTRUMENTS) ×2 IMPLANT
HANDLE SUCTION POOLE (INSTRUMENTS) IMPLANT
IRRIG SUCT STRYKERFLOW 2 WTIP (MISCELLANEOUS) ×2
IRRIGATION SUCT STRKRFLW 2 WTP (MISCELLANEOUS) ×2 IMPLANT
KIT BASIN OR (CUSTOM PROCEDURE TRAY) ×2 IMPLANT
KIT TURNOVER KIT B (KITS) IMPLANT
L-HOOK LAP DISP 36CM (ELECTROSURGICAL)
LHOOK LAP DISP 36CM (ELECTROSURGICAL) IMPLANT
NDL 22X1.5 STRL (OR ONLY) (MISCELLANEOUS) ×2 IMPLANT
NDL HYPO 22X1.5 SAFETY MO (MISCELLANEOUS) ×2 IMPLANT
NDL INSUFFLATION 14GA 120MM (NEEDLE) ×2 IMPLANT
NEEDLE 22X1.5 STRL (OR ONLY) (MISCELLANEOUS) ×2 IMPLANT
NEEDLE HYPO 22X1.5 SAFETY MO (MISCELLANEOUS) ×2 IMPLANT
NEEDLE INSUFFLATION 14GA 120MM (NEEDLE) ×4 IMPLANT
OBTURATOR OPTICAL STND 8 DVNC (TROCAR)
OBTURATOR OPTICALSTD 8 DVNC (TROCAR) IMPLANT
PAD ARMBOARD 7.5X6 YLW CONV (MISCELLANEOUS) ×4 IMPLANT
PENCIL SMOKE EVACUATOR (MISCELLANEOUS) IMPLANT
PORT LAP GEL ALEXIS MED 5-9CM (MISCELLANEOUS) IMPLANT
POUCH RETRIEVAL ECOSAC 10 (ENDOMECHANICALS) ×2 IMPLANT
RELOAD STAPLE 60 3.5 BLU DVNC (STAPLE) IMPLANT
RELOAD STAPLE 60 4.3 GRN DVNC (STAPLE) IMPLANT
RELOAD STAPLER 3.5X60 BLU DVNC (STAPLE) ×2 IMPLANT
RELOAD STAPLER 4.3X60 GRN DVNC (STAPLE) ×10 IMPLANT
RETRACTOR WND ALEXIS 18 MED (MISCELLANEOUS) IMPLANT
RTRCTR WOUND ALEXIS 18CM MED (MISCELLANEOUS) ×2
SCISSORS LAP 5X35 DISP (ENDOMECHANICALS) IMPLANT
SCISSORS MNPLR CVD DVNC XI (INSTRUMENTS) ×2 IMPLANT
SEAL UNIV 5-12 XI (MISCELLANEOUS) ×6 IMPLANT
SEALER VESSEL EXT DVNC XI (MISCELLANEOUS) ×2 IMPLANT
SET TUBE SMOKE EVAC HIGH FLOW (TUBING) ×2 IMPLANT
SLEEVE XCEL OPT CAN 5 100 (ENDOMECHANICALS) IMPLANT
SPIKE FLUID TRANSFER (MISCELLANEOUS) ×2 IMPLANT
SPONGE T-LAP 18X18 ~~LOC~~+RFID (SPONGE) IMPLANT
STAPLER 60 SUREFORM DVNC (STAPLE) ×2 IMPLANT
STAPLER RELOAD 3.5X60 BLU DVNC (STAPLE) ×2
STAPLER RELOAD 4.3X60 GRN DVNC (STAPLE) ×10
STAPLER VISISTAT 35W (STAPLE) ×2 IMPLANT
STOPCOCK 4 WAY LG BORE MALE ST (IV SETS) ×2 IMPLANT
STRIP CLOSURE SKIN 1/2X4 (GAUZE/BANDAGES/DRESSINGS) IMPLANT
SUCTION POOLE HANDLE (INSTRUMENTS) ×2
SUT DVC VLOC 180 2-0 12IN GS21 (SUTURE)
SUT ETHILON 2 0 FS 18 (SUTURE) IMPLANT
SUT MNCRL AB 4-0 PS2 18 (SUTURE) ×2 IMPLANT
SUT NOVA NAB DX-16 0-1 5-0 T12 (SUTURE) ×2 IMPLANT
SUT PDS AB 1 CT1 36 (SUTURE) IMPLANT
SUT PDS AB 1 CTX 36 (SUTURE) IMPLANT
SUT PDS AB 2-0 CT2 27 (SUTURE) IMPLANT
SUT PROLENE 2 0 SH DA (SUTURE) IMPLANT
SUT SILK 2 0 (SUTURE)
SUT SILK 2 0 SH CR/8 (SUTURE) IMPLANT
SUT SILK 2-0 18XBRD TIE 12 (SUTURE) IMPLANT
SUT SILK 3 0 (SUTURE)
SUT SILK 3 0 SH CR/8 (SUTURE) IMPLANT
SUT SILK 3-0 18XBRD TIE 12 (SUTURE) IMPLANT
SUT VIC AB 3-0 SH 27 (SUTURE) ×2
SUT VIC AB 3-0 SH 27X BRD (SUTURE) IMPLANT
SUT VICRYL 0 TIES 12 18 (SUTURE) IMPLANT
SUT VLOC 180 2-0 6IN GS21 (SUTURE) IMPLANT
SUTURE DVC VL 180 2-0 12INGS21 (SUTURE) IMPLANT
SYR TOOMEY IRRIG 70ML (MISCELLANEOUS) ×2
SYRINGE TOOMEY IRRIG 70ML (MISCELLANEOUS) IMPLANT
TIP INNERVISION DETACH 56FR (MISCELLANEOUS) IMPLANT
TOWEL GREEN STERILE FF (TOWEL DISPOSABLE) ×2 IMPLANT
TRAY FOLEY MTR SLVR 14FR STAT (SET/KITS/TRAYS/PACK) ×2 IMPLANT
TRAY FOLEY MTR SLVR 16FR STAT (SET/KITS/TRAYS/PACK) ×2 IMPLANT
TRAY LAPAROSCOPIC MC (CUSTOM PROCEDURE TRAY) ×2 IMPLANT
TROCAR ADV FIXATION 5X100MM (TROCAR) ×2 IMPLANT
TROCAR Z-THREAD OPTICAL 5X100M (TROCAR) IMPLANT
TUBE CONNECTING 12X1/4 (SUCTIONS) IMPLANT
TUBE GASTRO BOLUS 20FR ENFIT (TUBING) IMPLANT
YANKAUER SUCT BULB TIP NO VENT (SUCTIONS) IMPLANT

## 2023-06-17 NOTE — Transfer of Care (Signed)
Immediate Anesthesia Transfer of Care Note  Patient: Alan Jennings  Procedure(s) Performed: ROBOTIC DISTAL GASTRECTOMY (Abdomen) INSERTION OF GASTROSTOMY TUBE (Abdomen)  Patient Location: PACU  Anesthesia Type:General  Level of Consciousness: sedated  Airway & Oxygen Therapy: Patient Spontanous Breathing  Post-op Assessment: Report given to RN and Post -op Vital signs reviewed and stable  Post vital signs: Reviewed and stable  Last Vitals:  Vitals Value Taken Time  BP 131/76 06/17/23 1935  Temp    Pulse 100 06/17/23 1943  Resp 15 06/17/23 1943  SpO2 97 % 06/17/23 1943  Vitals shown include unvalidated device data.  Last Pain:  Vitals:   06/17/23 1935  PainSc: Asleep         Complications: No notable events documented.

## 2023-06-17 NOTE — Anesthesia Procedure Notes (Signed)
Procedure Name: Intubation Date/Time: 06/17/2023 2:04 PM  Performed by: Loleta Kelleen Stolze, CRNAPre-anesthesia Checklist: Patient identified, Patient being monitored, Timeout performed, Emergency Drugs available and Suction available Patient Re-evaluated:Patient Re-evaluated prior to induction Oxygen Delivery Method: Circle system utilized Preoxygenation: Pre-oxygenation with 100% oxygen Induction Type: IV induction Ventilation: Mask ventilation without difficulty Laryngoscope Size: Mac and 4 Grade View: Grade I Tube type: Oral Tube size: 7.0 mm Number of attempts: 1 Airway Equipment and Method: Stylet Placement Confirmation: ETT inserted through vocal cords under direct vision, positive ETCO2 and breath sounds checked- equal and bilateral Secured at: 22 cm Tube secured with: Tape Dental Injury: Teeth and Oropharynx as per pre-operative assessment

## 2023-06-17 NOTE — Interval H&P Note (Signed)
History and Physical Interval Note:  06/17/2023 11:49 AM  Alan Jennings  has presented today for surgery, with the diagnosis of DUODENAL ULCER DISEASE.  The various methods of treatment have been discussed with the patient and family. After consideration of risks, benefits and other options for treatment, the patient has consented to  Procedure(s): ROBOTIC DISTAL GASTRECTOMY (N/A) as a surgical intervention.  The patient's history has been reviewed, patient examined, no change in status, stable for surgery.  I have reviewed the patient's chart and labs.  Questions were answered to the patient's satisfaction.     Almond Lint

## 2023-06-17 NOTE — Plan of Care (Signed)

## 2023-06-17 NOTE — Op Note (Signed)
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  FINDINGS:  very dense fibrotic stricture duodenum  EBL: 50mL  PROCEDURE:   Patient was identified in the holding area and taken the operating room where he was placed supine on the operating room table.  Sequential compression devices were placed.  General anesthesia was induced.  Foley catheter was placed.  His arms were tucked and appropriately padded.  The abdomen was prepped and draped in sterile fashion.  A timeout was performed according to the surgical safety checklist.  When all was correct, we continued.  The patient was placed into reverse Trendelenburg position and rotated to the right.  Local anesthetic was administered at the left costal margin and a small stab incision was made with #11 blade.  The Veress needle was used to access the abdomen.  Upon first pass, gastric secretions were obtained back in the Veress needle, so this was redirected and was successful.  Water was freely able to fall into the peritoneal cavity without any gastric secretions coming back.  The abdomen was then insufflated to a pressure of 15 mmHg.  Port sites were marked out and a U-shape along the lower abdomen and lateral abdomen.  Ports were placed under direct visualization other than  the first 1.  Care was taken to evaluate for any evidence of injury.  There is no evidence of injury to any visceral structures.  The 12 mm port was placed in the left mid abdomen.  The others were 8 mm robotic ports and a 5 mm balloon port.  The robot was then docked.  The patient's stomach was extraordinarily huge as expected from his gastric outlet obstruction.  The stomach was elevated superiorly.  The lesser sac was entered with the vessel sealer.  The omentum was taken off the colon the length of the transverse colon.  The incisura was identified.  The gastrohepatic ligament was also taken down with the vessel sealer.  The tissues distal on the stomach at the inferior aspect of the duodenum were also freed up.  There were several enlarged lymph nodes that were taken.  On the lesser curve, the dissection was taken to the porta.  Some of the peritoneal attachments were able to be freed up.  The duodenum was kocherized.  The gastroepiploic's were taken down on either side of the stomach and green loads of the robotic stapler were used to divide the stomach.  This was reflected superiorly and laterally to the right.  Attempts were made to mobilize the stomach more to get beyond the duodenal stricture.  However, in order to try to divide the duodenum beyond the stricture, we would risk injuring the bile duct.  Attempts were made to staple just proximal to the duodenal stricture, but the tissue was too inflamed and the stapler would not fire.  After several attempts to reposition  the stapler, the decision was made to divide the duodenal bulb with the vessel sealer.  There was some spillage of some gastric contents at this time.  The duodenum was then closed in [place] fashion with running 2-0 V-Loc sutures.  Adjacent omentum was sutured over the top of this.    A loop gastrojejunostomy was then created in an antecolic retrogastric configuration.  Around 30 cm beyond the ligament of Treitz was located and the  stomach and jejunum were opened with the robotic scissors.  A blue load of the stapler was then used to connect the jejunum to the stomach.  This defect was closed with 2 V-Loc sutures as well.  The anterior stomach was identified and a V-Loc pursestring suture was placed on the anterior wall.  An appropriate site for G-tube was located and a trocar was used to create a path.  A 20 French gastrostomy tube was selected and advanced to the abdominal wall.  The robotic scissors were then used to open the stomach.  The G-tube was advanced into the stomach and the balloon inflated.  The pursestring suture was secured down.  This V-Loc and an additional V-Loc were used to pexied the stomach to the anterior abdominal wall.  During this time, the abdominal pressure was decreased to minimize the effect of the insufflation on the tension on the stomach.  A small Pfannenstiel incision was made for a extraction port.  The skin was opened in a transverse curvilinear fashion.  The anterior fascia was then opened as well.  The posterior attachments were taken on the superior and inferior aspect of the fascia.  The peritoneum was entered in the midline between the rectus muscles.  A wound protector was placed.  The stomach and the duodenal bulb were removed via the extraction port.  Following this, the pelvis and right upper quadrant were irrigated copiously.  Care was taken to look for any spilled gastric contents.  A 19 [place] Blake drain was placed in the right upper quadrant and secured with a 2-0 nylon.  The peritoneum was closed with a running 0 Vicryl.  The fascia was closed in the Pfannenstiel with 2-0 running PDS sutures.  The 12 mm port fascia was closed with 0 Vicryl.  The skin of the port sites was closed with 4-0 Monocryl.  The flange of the G-tube was secured with interrupted 2-0 nylon sutures.  The extraction port was closed with interrupted 3-0 Vicryl and running 4-0 Monocryl.  The wounds were then cleaned,  dried, and dressed sterilely.  The patient was allowed to emerge from anesthesia and was taken to the PACU in stable condition.  Needle, sponge, and instrument counts were correct x 2.

## 2023-06-18 ENCOUNTER — Encounter (HOSPITAL_COMMUNITY): Payer: Self-pay | Admitting: General Surgery

## 2023-06-18 LAB — COMPREHENSIVE METABOLIC PANEL
ALT: 17 U/L (ref 0–44)
AST: 28 U/L (ref 15–41)
Albumin: 3.6 g/dL (ref 3.5–5.0)
Alkaline Phosphatase: 35 U/L — ABNORMAL LOW (ref 38–126)
Anion gap: 10 (ref 5–15)
BUN: 7 mg/dL (ref 6–20)
CO2: 23 mmol/L (ref 22–32)
Calcium: 8.4 mg/dL — ABNORMAL LOW (ref 8.9–10.3)
Chloride: 100 mmol/L (ref 98–111)
Creatinine, Ser: 0.79 mg/dL (ref 0.61–1.24)
GFR, Estimated: >60 mL/min (ref >60)
Glucose, Bld: 141 mg/dL — ABNORMAL HIGH (ref 70–99)
Potassium: 3.4 mmol/L — ABNORMAL LOW (ref 3.5–5.1)
Sodium: 133 mmol/L — ABNORMAL LOW (ref 135–145)
Total Bilirubin: 0.6 mg/dL (ref 0.3–1.2)
Total Protein: 6.5 g/dL (ref 6.5–8.1)

## 2023-06-18 LAB — CBC
HCT: 32 % — ABNORMAL LOW (ref 39.0–52.0)
Hemoglobin: 10.1 g/dL — ABNORMAL LOW (ref 13.0–17.0)
MCH: 23.8 pg — ABNORMAL LOW (ref 26.0–34.0)
MCHC: 31.6 g/dL (ref 30.0–36.0)
MCV: 75.5 fL — ABNORMAL LOW (ref 80.0–100.0)
Platelets: 280 10*3/uL (ref 150–400)
RBC: 4.24 MIL/uL (ref 4.22–5.81)
RDW: 17.1 % — ABNORMAL HIGH (ref 11.5–15.5)
WBC: 12 10*3/uL — ABNORMAL HIGH (ref 4.0–10.5)
nRBC: 0 % (ref 0.0–0.2)

## 2023-06-18 LAB — MAGNESIUM: Magnesium: 1.6 mg/dL — ABNORMAL LOW (ref 1.7–2.4)

## 2023-06-18 LAB — PHOSPHORUS: Phosphorus: 3.9 mg/dL (ref 2.5–4.6)

## 2023-06-18 LAB — GLUCOSE, CAPILLARY: Glucose-Capillary: 119 mg/dL — ABNORMAL HIGH (ref 70–99)

## 2023-06-18 MED ORDER — ENOXAPARIN SODIUM 40 MG/0.4ML IJ SOSY
40.0000 mg | PREFILLED_SYRINGE | INTRAMUSCULAR | Status: DC
  Administered 2023-06-18 – 2023-06-24 (×7): 40 mg via SUBCUTANEOUS
  Filled 2023-06-18 (×7): qty 0.4

## 2023-06-18 MED ORDER — DIPHENHYDRAMINE HCL 50 MG/ML IJ SOLN: 12.5000 mg | Freq: Four times a day (QID) | INTRAMUSCULAR | Status: DC | PRN

## 2023-06-18 MED ORDER — SODIUM CHLORIDE 0.9% FLUSH: 9.0000 mL | INTRAVENOUS | Status: DC | PRN

## 2023-06-18 MED ORDER — DIPHENHYDRAMINE HCL 12.5 MG/5ML PO ELIX: 12.5000 mg | ORAL_SOLUTION | Freq: Four times a day (QID) | ORAL | Status: DC | PRN

## 2023-06-18 MED ORDER — CHLORHEXIDINE GLUCONATE CLOTH 2 % EX PADS
6.0000 | MEDICATED_PAD | Freq: Every day | CUTANEOUS | Status: DC
  Administered 2023-06-20 – 2023-06-25 (×6): 6 via TOPICAL

## 2023-06-18 MED ORDER — HYDROMORPHONE 1 MG/ML IV SOLN
INTRAVENOUS | Status: DC
  Administered 2023-06-18: 30 mg via INTRAVENOUS
  Administered 2023-06-19: 6.6 mg via INTRAVENOUS
  Filled 2023-06-18: qty 30

## 2023-06-18 MED ORDER — ONDANSETRON HCL 4 MG/2ML IJ SOLN: 4.0000 mg | Freq: Four times a day (QID) | INTRAMUSCULAR | Status: DC | PRN

## 2023-06-18 MED ORDER — MAGNESIUM SULFATE 2 GM/50ML IV SOLN
2.0000 g | Freq: Once | INTRAVENOUS | Status: AC
  Administered 2023-06-18: 2 g via INTRAVENOUS
  Filled 2023-06-18: qty 50

## 2023-06-18 MED ORDER — NALOXONE HCL 0.4 MG/ML IJ SOLN: 0.4000 mg | INTRAMUSCULAR | Status: DC | PRN

## 2023-06-18 MED ORDER — POTASSIUM CHLORIDE 10 MEQ/100ML IV SOLN
10.0000 meq | INTRAVENOUS | Status: AC
  Administered 2023-06-18 (×2): 10 meq via INTRAVENOUS
  Filled 2023-06-18 (×2): qty 100

## 2023-06-18 NOTE — Anesthesia Postprocedure Evaluation (Signed)
Anesthesia Post Note  Patient: KHADE KURSZEWSKI  Procedure(s) Performed: ROBOTIC DISTAL GASTRECTOMY (Abdomen) INSERTION OF GASTROSTOMY TUBE (Abdomen)     Patient location during evaluation: PACU Anesthesia Type: General Level of consciousness: awake and alert Pain management: pain level controlled Vital Signs Assessment: post-procedure vital signs reviewed and stable Respiratory status: spontaneous breathing, nonlabored ventilation, respiratory function stable and patient connected to nasal cannula oxygen Cardiovascular status: blood pressure returned to baseline and stable Postop Assessment: no apparent nausea or vomiting Anesthetic complications: no   No notable events documented.  Last Vitals:  Vitals:   06/18/23 1636 06/18/23 2009  BP: 112/60 131/86  Pulse: 89 (!) 120  Resp: 16 18  Temp: 36.6 C 37 C  SpO2: 96% 100%    Last Pain:  Vitals:   06/18/23 2009  TempSrc: Oral  PainSc:                  Collene Schlichter

## 2023-06-18 NOTE — Progress Notes (Addendum)
1 Day Post-Op   Subjective/Chief Complaint: C/o  uncontrolled pain Hurts to take deep breathe, back pain    Objective: Vital signs in last 24 hours: Temp:  [98.1 F (36.7 C)-98.9 F (37.2 C)] 98.2 F (36.8 C) (07/04 0828) Pulse Rate:  [83-105] 105 (07/04 0828) Resp:  [12-20] 18 (07/04 0828) BP: (131-148)/(76-99) 141/90 (07/04 0828) SpO2:  [97 %-100 %] 100 % (07/04 0828) Weight:  [60.1 kg] 60.1 kg (07/03 1035) Last BM Date : 06/16/23  Intake/Output from previous day: 07/03 0701 - 07/04 0700 In: 4602.4 [I.V.:4071.7; IV Piggyback:490.7] Out: 4665 [Urine:3330; Drains:485; Blood:200] Intake/Output this shift: No intake/output data recorded.  Alert, appears uncomfortable Not taking deep breathes, nonlabored Reg Soft, flat, incision ok, G tube - to gravity; JP -serosang. Expected TTP +foley No edema  Lab Results:  Recent Labs    06/17/23 1052 06/18/23 0701  WBC  --  12.0*  HGB 12.9* 10.1*  HCT 38.0* 32.0*  PLT  --  280   BMET Recent Labs    06/17/23 1052 06/18/23 0701  NA 137 133*  K 3.1* 3.4*  CL 99 100  CO2  --  23  GLUCOSE 93 141*  BUN 13 7  CREATININE 1.00 0.79  CALCIUM  --  8.4*   PT/INR No results for input(s): "LABPROT", "INR" in the last 72 hours. ABG No results for input(s): "PHART", "HCO3" in the last 72 hours.  Invalid input(s): "PCO2", "PO2"  Studies/Results: No results found.  Anti-infectives: Anti-infectives (From admission, onward)    Start     Dose/Rate Route Frequency Ordered Stop   06/18/23 0200  ceFAZolin (ANCEF) IVPB 2g/100 mL premix        2 g 200 mL/hr over 30 Minutes Intravenous Every 8 hours 06/17/23 2104 06/18/23 0145   06/17/23 1033  ceFAZolin (ANCEF) 2-4 GM/100ML-% IVPB       Note to Pharmacy: Lurena Nida: cabinet override      06/17/23 1033 06/17/23 1421   06/17/23 1030  ceFAZolin (ANCEF) IVPB 2g/100 mL premix        2 g 200 mL/hr over 30 Minutes Intravenous On call to O.R. 06/17/23 1027 06/17/23 1817        Assessment/Plan: s/p Procedure(s): ROBOTIC DISTAL GASTRECTOMY (N/A) INSERTION OF GASTROSTOMY TUBE Dr [name]  Work on pain control - start PCA, cont IV tylenol. Not a candidate for NSAIDs Hypokalemia and hypomagnesia - replace potassium/magnesium  Chronic anemia - ulcer disease- trend cbc VTE prophylaxis - scds, start prophylactic lovenox this evening  GI - g tube gravity, g tube flushes; sips of clears Foley - discontinue   LOS: 1 day    Alan Jennings 06/18/2023

## 2023-06-18 NOTE — Progress Notes (Signed)
   06/18/23 2009  Vitals  Temp 98.6 F (37 C)  Temp Source Oral  BP 131/86  MAP (mmHg) 99  BP Location Right Arm  BP Method Automatic  Patient Position (if appropriate) Lying  Pulse Rate (!) 120  Pulse Rate Source Monitor  Resp 18  MEWS COLOR  MEWS Score Color Yellow  Oxygen Therapy  SpO2 100 %  O2 Device Nasal Cannula  MEWS Score  MEWS Temp 0  MEWS Systolic 0  MEWS Pulse 2  MEWS RR 0  MEWS LOC 0  MEWS Score 2   Charge RN notified.

## 2023-06-18 NOTE — Progress Notes (Signed)
Derrell Lolling, MD notified due to poorly controlled pain. Patient reported a pain level 10 out of 10 ,and finds it hard to take a deep breathe. MD notified of this and ordered 2mg  Dilaudid Q2hr prn. Patient reported that it seemed to help some but is still in tremendous amount of pain. Patient was encouraged relaxation techniques and diversion strategies which seemed to not help. Patient was also given Ativan at 0018 . Patient reported that it did help relax him. Will continue to monitor as appropriate. All vitals stable at this time.

## 2023-06-19 ENCOUNTER — Inpatient Hospital Stay (HOSPITAL_COMMUNITY): Payer: Medicaid Other

## 2023-06-19 LAB — MAGNESIUM: Magnesium: 2.4 mg/dL (ref 1.7–2.4)

## 2023-06-19 LAB — COMPREHENSIVE METABOLIC PANEL
ALT: 14 U/L (ref 0–44)
AST: 22 U/L (ref 15–41)
Albumin: 3.1 g/dL — ABNORMAL LOW (ref 3.5–5.0)
Alkaline Phosphatase: 43 U/L (ref 38–126)
Anion gap: 10 (ref 5–15)
BUN: 7 mg/dL (ref 6–20)
CO2: 23 mmol/L (ref 22–32)
Calcium: 8 mg/dL — ABNORMAL LOW (ref 8.9–10.3)
Chloride: 100 mmol/L (ref 98–111)
Creatinine, Ser: 0.85 mg/dL (ref 0.61–1.24)
GFR, Estimated: >60 mL/min (ref >60)
Glucose, Bld: 130 mg/dL — ABNORMAL HIGH (ref 70–99)
Potassium: 4 mmol/L (ref 3.5–5.1)
Sodium: 133 mmol/L — ABNORMAL LOW (ref 135–145)
Total Bilirubin: 0.5 mg/dL (ref 0.3–1.2)
Total Protein: 6.2 g/dL — ABNORMAL LOW (ref 6.5–8.1)

## 2023-06-19 LAB — CBC
HCT: 35.1 % — ABNORMAL LOW (ref 39.0–52.0)
Hemoglobin: 10.9 g/dL — ABNORMAL LOW (ref 13.0–17.0)
MCH: 23.7 pg — ABNORMAL LOW (ref 26.0–34.0)
MCHC: 31.1 g/dL (ref 30.0–36.0)
MCV: 76.5 fL — ABNORMAL LOW (ref 80.0–100.0)
Platelets: 346 10*3/uL (ref 150–400)
RBC: 4.59 MIL/uL (ref 4.22–5.81)
RDW: 17.7 % — ABNORMAL HIGH (ref 11.5–15.5)
WBC: 17.3 10*3/uL — ABNORMAL HIGH (ref 4.0–10.5)
nRBC: 0 % (ref 0.0–0.2)

## 2023-06-19 LAB — PHOSPHORUS: Phosphorus: 2.5 mg/dL (ref 2.5–4.6)

## 2023-06-19 MED ORDER — DIPHENHYDRAMINE HCL 50 MG/ML IJ SOLN: 12.5000 mg | Freq: Four times a day (QID) | INTRAMUSCULAR | Status: DC | PRN

## 2023-06-19 MED ORDER — PIPERACILLIN-TAZOBACTAM 3.375 G IVPB
3.3750 g | Freq: Three times a day (TID) | INTRAVENOUS | Status: DC
  Administered 2023-06-19 – 2023-06-25 (×19): 3.375 g via INTRAVENOUS
  Filled 2023-06-19 (×19): qty 50

## 2023-06-19 MED ORDER — SODIUM CHLORIDE 0.9% FLUSH: 9.0000 mL | INTRAVENOUS | Status: DC | PRN

## 2023-06-19 MED ORDER — BUPRENORPHINE HCL 8 MG SL SUBL
12.0000 mg | SUBLINGUAL_TABLET | Freq: Three times a day (TID) | SUBLINGUAL | Status: DC
  Administered 2023-06-19 – 2023-06-25 (×19): 12 mg via SUBLINGUAL
  Filled 2023-06-19 (×20): qty 2

## 2023-06-19 MED ORDER — ONDANSETRON HCL 4 MG/2ML IJ SOLN: 4.0000 mg | Freq: Four times a day (QID) | INTRAMUSCULAR | Status: DC | PRN

## 2023-06-19 MED ORDER — HYDROMORPHONE 1 MG/ML IV SOLN
INTRAVENOUS | Status: DC
  Administered 2023-06-19: 4 mg via INTRAVENOUS
  Administered 2023-06-19: 5 mg via INTRAVENOUS
  Administered 2023-06-19: 30 mg via INTRAVENOUS
  Administered 2023-06-19: 5 mg via INTRAVENOUS
  Administered 2023-06-20: 6.5 mg via INTRAVENOUS
  Administered 2023-06-20: 4.5 mg via INTRAVENOUS
  Administered 2023-06-20: 6 mg via INTRAVENOUS
  Administered 2023-06-20: 3 mg via INTRAVENOUS
  Administered 2023-06-20: 3.5 mg via INTRAVENOUS
  Administered 2023-06-20: 4.5 mg via INTRAVENOUS
  Administered 2023-06-20: 30 mg via INTRAVENOUS
  Administered 2023-06-21: 3.5 mg via INTRAVENOUS
  Administered 2023-06-21: 4 mg via INTRAVENOUS
  Administered 2023-06-21: 5.5 mg via INTRAVENOUS
  Administered 2023-06-21: 5 mg via INTRAVENOUS
  Administered 2023-06-21: 5.5 mg via INTRAVENOUS
  Administered 2023-06-21: 10.5 mg via INTRAVENOUS
  Administered 2023-06-21 – 2023-06-22 (×2): 30 mg via INTRAVENOUS
  Administered 2023-06-22: 5 mg via INTRAVENOUS
  Administered 2023-06-22: 2.5 mg via INTRAVENOUS
  Administered 2023-06-22: 5 mg via INTRAVENOUS
  Administered 2023-06-22: 3 mg via INTRAVENOUS
  Administered 2023-06-23: 5 mg via INTRAVENOUS
  Filled 2023-06-19 (×5): qty 30

## 2023-06-19 MED ORDER — NALOXONE HCL 0.4 MG/ML IJ SOLN: 0.4000 mg | INTRAMUSCULAR | Status: DC | PRN

## 2023-06-19 MED ORDER — DIPHENHYDRAMINE HCL 12.5 MG/5ML PO ELIX: 12.5000 mg | ORAL_SOLUTION | Freq: Four times a day (QID) | ORAL | Status: DC | PRN

## 2023-06-19 NOTE — Progress Notes (Addendum)
Pharmacy Antibiotic Note  Alan Jennings is a 42 y.o. male admitted on 06/17/2023 with duodenal stricture/ulcers status post partial gastrectomy.  Pharmacy has been consulted for Zosyn dosing for empiric coverage.   Renal function stable, afebrile, WBC up to 17.3.  Plan: Zosyn 3.375g IV q8h (4 hour infusion). Pharmacy will sign off with stable renal function.  Thank you for the consult!  Height: 5\' 8"  (172.7 cm) Weight: 60.1 kg (132 lb 9.6 oz) IBW/kg (Calculated) : 68.4  Temp (24hrs), Avg:98.5 F (36.9 C), Min:97.9 F (36.6 C), Max:99.3 F (37.4 C)  Recent Labs  Lab 06/17/23 1052 06/18/23 0701 06/19/23 0239  WBC  --  12.0* 17.3*  CREATININE 1.00 0.79 0.85    Estimated Creatinine Clearance: 97.2 mL/min (by C-G formula based on SCr of 0.85 mg/dL).    Allergies  Allergen Reactions   Tramadol Other (See Comments)    SEIZURES   Nsaids Other (See Comments)    Has caused ulcers and needs to avoid    Antimicrobials this admission: Zosyn 3.375g [facility]  7/5 >> current  Dose adjustments this admission:  Microbiology results:  Thank you for allowing pharmacy to be a part of this patient's care.  Adesuwa  Utomwen 06/19/2023 10:04 AM   Layman Gully D. Laney Potash, PharmD, BCPS, BCCCP 06/19/2023, 2:50 PM

## 2023-06-19 NOTE — TOC Initial Note (Signed)
Transition of Care [facility]) - Initial/Assessment Note    Patient Details  Name: Alan Jennings MRN: [id number] Date of Birth: 04/05/1981  Transition of [facility]) CM/SW Contact:    Mearl Latin, LCSW Phone Number: 06/19/2023, 5:42 PM  Clinical Narrative:                 Patient admitted from home and had GASTROSTOMY TUBE placed. Please consult TOC if needs arise.     Barriers to Discharge: Continued Medical Work up   Patient Goals and CMS Choice            Expected Discharge Plan and Services       Living arrangements for the past 2 months: Single Family Home                                      Prior Living Arrangements/Services Living arrangements for the past 2 months: Single Family Home   Patient language and need for interpreter reviewed:: Yes        Need for Family Participation in Patient Care: No (Comment) Care giver support system in place?: Yes (comment)   Criminal Activity/Legal Involvement Pertinent to Current Situation/Hospitalization: No - Comment as needed  Activities of Daily Living Home Assistive Devices/Equipment: None ADL Screening (condition at time of admission) Patient's cognitive ability adequate to safely complete daily activities?: Yes Is the patient deaf or have difficulty hearing?: No Does the patient have difficulty seeing, even when wearing glasses/contacts?: No Does the patient have difficulty concentrating, remembering, or making decisions?: No Patient able to express need for assistance with ADLs?: Yes Does the patient have difficulty dressing or bathing?: No Independently performs ADLs?: Yes (appropriate for developmental age) Does the patient have difficulty walking or climbing stairs?: No Weakness of Legs: None Weakness of Arms/Hands: None  Permission Sought/Granted                  Emotional Assessment       Orientation: : Oriented to Self, Oriented to Place, Oriented to  Time, Oriented to  Situation Alcohol / Substance Use: Not Applicable Psych Involvement: No (comment)  Admission diagnosis:  Duodenal stricture [K31.5] Patient Active Problem List   Diagnosis Date Noted   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 [place] [address] Phone: [phone] Fax: [phone]     Social Determinants of Health (SDOH) Social History: SDOH Screenings   Food Insecurity: No Food Insecurity (06/17/2023)  Housing: Medium Risk (06/17/2023)  Transportation Needs: No Transportation Needs (06/17/2023)  Utilities: Not At Risk (06/17/2023)  Tobacco Use: Medium Risk (06/18/2023)   SDOH Interventions:     Readmission Risk Interventions    05/21/2023   11:44 AM  Readmission Risk Prevention Plan  Transportation Screening Complete  PCP or Specialist Appt within 5-7 Days Complete  Home Care Screening Complete  Medication Review (RN CM) Complete

## 2023-06-19 NOTE — Progress Notes (Signed)
PCA Dilaudid cartridge replaced by Margaretmary Dys, RN. 4 ml of PCA Dilaudid wasted from previous cartridge.

## 2023-06-19 NOTE — Progress Notes (Signed)
2 Days Post-Op   Subjective/Chief Complaint: C/o  still having significant pain despite switching to dilaudid PCA.  Still having trouble taking deep breaths.  Thirsty.  MEWS score 3 due to heart rate.    Objective: Vital signs in last 24 hours: Temp:  [97.9 F (36.6 C)-99.3 F (37.4 C)] 98.3 F (36.8 C) (07/05 0846) Pulse Rate:  [89-137] 120 (07/05 0846) Resp:  [16-23] 17 (07/05 0846) BP: (112-144)/(60-99) 135/89 (07/05 0846) SpO2:  [96 %-100 %] 100 % (07/05 0846) Last BM Date : 06/16/23  Intake/Output from previous day: 07/04 0701 - 07/05 0700 In: 80  Out: 2145 [Urine:1700; Drains:445] Intake/Output this shift: Total I/O In: 237.7 [Other:40; IV Piggyback:197.7] Out: -   Alert, appears uncomfortable. Lying very still.   Resp:  Not taking deep breaths, nonlabored Reg rate Abd:  Soft, flat, incisions ok, G tube - to gravity with some bilious output; JP -serosang. Expected TTP +foley No edema  Lab Results:  Recent Labs    06/18/23 0701 06/19/23 0239  WBC 12.0* 17.3*  HGB 10.1* 10.9*  HCT 32.0* 35.1*  PLT 280 346   BMET Recent Labs    06/18/23 0701 06/19/23 0239  NA 133* 133*  K 3.4* 4.0  CL 100 100  CO2 23 23  GLUCOSE 141* 130*  BUN 7 7  CREATININE 0.79 0.85  CALCIUM 8.4* 8.0*   PT/INR No results for input(s): "LABPROT", "INR" in the last 72 hours. ABG No results for input(s): "PHART", "HCO3" in the last 72 hours.  Invalid input(s): "PCO2", "PO2"  Studies/Results: No results found.  Anti-infectives: Anti-infectives (From admission, onward)    Start     Dose/Rate Route Frequency Ordered Stop   06/19/23 1000  piperacillin-tazobactam (ZOSYN) IVPB 3.375 g        3.375 g 12.5 mL/hr over 240 Minutes Intravenous Every 8 hours 06/19/23 0910     06/18/23 0200  ceFAZolin (ANCEF) IVPB 2g/100 mL premix        2 g 200 mL/hr over 30 Minutes Intravenous Every 8 hours 06/17/23 2104 06/18/23 0145   06/17/23 1033  ceFAZolin (ANCEF) 2-4 GM/100ML-% IVPB        Note to Pharmacy: Jamelle Rushing, GRETA: cabinet override      06/17/23 1033 06/17/23 1421   06/17/23 1030  ceFAZolin (ANCEF) IVPB 2g/100 mL premix        2 g 200 mL/hr over 30 Minutes Intravenous On call to O.R. 06/17/23 1027 06/17/23 1817       Assessment/Plan: s/p Procedure(s): ROBOTIC DISTAL GASTRECTOMY (N/A) INSERTION OF GASTROSTOMY TUBE Shauntee Karp 06/17/23  Work on pain control - increase to custom dose PCA, increased buprenorphine, switch to oral tylenol.  Not sure how much oral meds will be absorbed due to leaving g tube open for now.  Stomach was very full and atonic.  Don't want to overload at this point.   Hypokalemia and hypomagnesia - replaced potassium/magnesium 7/4. Corrected on today's labs.    Chronic anemia - ulcer disease- trend cbc VTE prophylaxis - scds, prophylactic lovenox this evening  WBCs increasing.  Given pain and poor duodenal tissues, starting zosyn today empirically.  GI - g tube gravity, g tube flushes; bariatric clears today to keep it low volume.   Foley out.   CXR to evaluate SOB - likely atelectasis    LOS: 2 days   Almond Lint 06/19/2023

## 2023-06-19 NOTE — Progress Notes (Signed)
   06/18/23 2350  Vitals  Temp 99.3 F (37.4 C)  Temp Source Oral  BP (!) 132/93  MAP (mmHg) 105  BP Location Left Arm  BP Method Automatic  Patient Position (if appropriate) Sitting  Pulse Rate (!) 135  Pulse Rate Source Monitor  Resp 18  MEWS COLOR  MEWS Score Color Yellow  Oxygen Therapy  SpO2 98 %  O2 Device Nasal Cannula  MEWS Score  MEWS Temp 0  MEWS Systolic 0  MEWS Pulse 3  MEWS RR 0  MEWS LOC 0  MEWS Score 3  Provider Notification  Provider Name/Title MD Almond Lint  Date Provider Notified 06/19/23  Time Provider Notified 0020  Method of Notification Page  Notification Reason Other (Comment) (MEWS yellow (3) , elevated HR)  Provider response No new orders;Other (Comment) (continue to monitor.)  Date of Provider Response 06/19/23  Time of Provider Response [id number]

## 2023-06-19 NOTE — Progress Notes (Signed)
   06/19/23 0846  Assess: MEWS Score  Temp 98.3 F (36.8 C)  BP 135/89  MAP (mmHg) 103  Pulse Rate (!) 120  Resp 17  SpO2 100 %  O2 Device Nasal Cannula  Assess: MEWS Score  MEWS Temp 0  MEWS Systolic 0  MEWS Pulse 2  MEWS RR 0  MEWS LOC 0  MEWS Score 2  MEWS Score Color Yellow  Assess: if the MEWS score is Yellow or Red  Were vital signs taken at a resting state? Yes  Focused Assessment No change from prior assessment  Does the patient meet 2 or more of the SIRS criteria? No  MEWS guidelines implemented  No, previously yellow, continue vital signs every 4 hours  Assess: SIRS CRITERIA  SIRS Temperature  0  SIRS Pulse 1  SIRS Respirations  0  SIRS WBC 0  SIRS Score Sum  1

## 2023-06-19 NOTE — Plan of Care (Signed)

## 2023-06-19 NOTE — Progress Notes (Signed)
PCA syringe replaced. Wasted from previous syringe with Yetta Barre. RN.

## 2023-06-20 LAB — CBC
HCT: 31.1 % — ABNORMAL LOW (ref 39.0–52.0)
Hemoglobin: 9.4 g/dL — ABNORMAL LOW (ref 13.0–17.0)
MCH: 23.6 pg — ABNORMAL LOW (ref 26.0–34.0)
MCHC: 30.2 g/dL (ref 30.0–36.0)
MCV: 77.9 fL — ABNORMAL LOW (ref 80.0–100.0)
Platelets: 311 10*3/uL (ref 150–400)
RBC: 3.99 MIL/uL — ABNORMAL LOW (ref 4.22–5.81)
RDW: 17.3 % — ABNORMAL HIGH (ref 11.5–15.5)
WBC: 15.9 10*3/uL — ABNORMAL HIGH (ref 4.0–10.5)
nRBC: 0 % (ref 0.0–0.2)

## 2023-06-20 LAB — COMPREHENSIVE METABOLIC PANEL
ALT: 11 U/L (ref 0–44)
AST: 15 U/L (ref 15–41)
Albumin: 2.8 g/dL — ABNORMAL LOW (ref 3.5–5.0)
Alkaline Phosphatase: 42 U/L (ref 38–126)
Anion gap: 10 (ref 5–15)
BUN: 7 mg/dL (ref 6–20)
CO2: 22 mmol/L (ref 22–32)
Calcium: 8 mg/dL — ABNORMAL LOW (ref 8.9–10.3)
Chloride: 99 mmol/L (ref 98–111)
Creatinine, Ser: 0.71 mg/dL (ref 0.61–1.24)
GFR, Estimated: >60 mL/min (ref >60)
Glucose, Bld: 119 mg/dL — ABNORMAL HIGH (ref 70–99)
Potassium: 4.2 mmol/L (ref 3.5–5.1)
Sodium: 131 mmol/L — ABNORMAL LOW (ref 135–145)
Total Bilirubin: 0.5 mg/dL (ref 0.3–1.2)
Total Protein: 6 g/dL — ABNORMAL LOW (ref 6.5–8.1)

## 2023-06-20 LAB — PHOSPHORUS: Phosphorus: 1.6 mg/dL — ABNORMAL LOW (ref 2.5–4.6)

## 2023-06-20 LAB — MAGNESIUM: Magnesium: 2.3 mg/dL (ref 1.7–2.4)

## 2023-06-20 MED ORDER — SODIUM PHOSPHATES 45 MMOLE/15ML IV SOLN
15.0000 mmol | Freq: Once | INTRAVENOUS | Status: AC
  Administered 2023-06-20: 15 mmol via INTRAVENOUS
  Filled 2023-06-20: qty 5

## 2023-06-20 NOTE — Progress Notes (Signed)
PCA syringe changed. 3mL from previous syringe wasted in stericycle with Racquel, RN

## 2023-06-20 NOTE — Progress Notes (Signed)
3 Days Post-Op   Subjective/Chief Complaint: Pain controlled on dilaudid PCA.  Tolerating clears without nausea.  Still having trouble taking deep breaths.  HR improved    Objective: Vital signs in last 24 hours: Temp:  [98.1 F (36.7 C)-98.7 F (37.1 C)] 98.1 F (36.7 C) (07/06 0412) Pulse Rate:  [93-120] 93 (07/06 0412) Resp:  [16-20] 19 (07/06 0412) BP: (132-144)/(88-94) 133/90 (07/06 0412) SpO2:  [99 %-100 %] 100 % (07/06 0412) Last BM Date : 06/16/23  Intake/Output from previous day: 07/05 0701 - 07/06 0700 In: 3481.6 [P.O.:50; I.V.:2875.6; IV Piggyback:516] Out: 1955 [Urine:1425; Drains:530] Intake/Output this shift: No intake/output data recorded.  Alert, appears somewhat comfortable  Resp: nonlabored Reg rate Abd:  Soft, flat, incisions ok, G tube - capped; JP -serosang. Expected TTP +foley No edema  Lab Results:  Recent Labs    06/19/23 0239 06/20/23 0045  WBC 17.3* 15.9*  HGB 10.9* 9.4*  HCT 35.1* 31.1*  PLT 346 311    BMET Recent Labs    06/19/23 0239 06/20/23 0045  NA 133* 131*  K 4.0 4.2  CL 100 99  CO2 23 22  GLUCOSE 130* 119*  BUN 7 7  CREATININE 0.85 0.71  CALCIUM 8.0* 8.0*    PT/INR No results for input(s): "LABPROT", "INR" in the last 72 hours. ABG No results for input(s): "PHART", "HCO3" in the last 72 hours.  Invalid input(s): "PCO2", "PO2"  Studies/Results: DG CHEST PORT 1 VIEW  Result Date: 06/19/2023 CLINICAL DATA:  Shortness of breath. EXAM: PORTABLE CHEST 1 VIEW COMPARISON:  01/07/2019. FINDINGS: Normal heart, mediastinum and hila. Clear lungs.  No pleural effusion or pneumothorax. Skeletal structures are grossly intact. IMPRESSION: No active disease. Electronically Signed   By: Amie Portland M.D.   On: 06/19/2023 12:03    Anti-infectives: Anti-infectives (From admission, onward)    Start     Dose/Rate Route Frequency Ordered Stop   06/19/23 1000  piperacillin-tazobactam (ZOSYN) IVPB 3.375 g        3.375 g 12.5 mL/hr  over 240 Minutes Intravenous Every 8 hours 06/19/23 0910     06/18/23 0200  ceFAZolin (ANCEF) IVPB 2g/100 mL premix        2 g 200 mL/hr over 30 Minutes Intravenous Every 8 hours 06/17/23 2104 06/18/23 0145   06/17/23 1033  ceFAZolin (ANCEF) 2-4 GM/100ML-% IVPB       Note to Pharmacy: Lurena Nida: cabinet override      06/17/23 1033 06/17/23 1421   06/17/23 1030  ceFAZolin (ANCEF) IVPB 2g/100 mL premix        2 g 200 mL/hr over 30 Minutes Intravenous On call to O.R. 06/17/23 1027 06/17/23 1817       Assessment/Plan: s/p Procedure(s): ROBOTIC DISTAL GASTRECTOMY (N/A) INSERTION OF GASTROSTOMY TUBE Byerly 06/17/23  Cont custom dose PCA, increased buprenorphine, oral tylenol.   Replace Phos Chronic anemia - ulcer disease- trend cbc VTE prophylaxis - scds, prophylactic lovenox this evening  WBC better with zosyn, started 7/5.  GI - g tube gravity, g tube flushes; bariatric fulls today to keep it low volume.       LOS: 3 days   Vanita Panda 06/20/2023

## 2023-06-21 LAB — CBC
HCT: 27.1 % — ABNORMAL LOW (ref 39.0–52.0)
Hemoglobin: 8.2 g/dL — ABNORMAL LOW (ref 13.0–17.0)
MCH: 22.8 pg — ABNORMAL LOW (ref 26.0–34.0)
MCHC: 30.3 g/dL (ref 30.0–36.0)
MCV: 75.5 fL — ABNORMAL LOW (ref 80.0–100.0)
Platelets: 320 10*3/uL (ref 150–400)
RBC: 3.59 MIL/uL — ABNORMAL LOW (ref 4.22–5.81)
RDW: 17.2 % — ABNORMAL HIGH (ref 11.5–15.5)
WBC: 13.3 10*3/uL — ABNORMAL HIGH (ref 4.0–10.5)
nRBC: 0 % (ref 0.0–0.2)

## 2023-06-21 LAB — BASIC METABOLIC PANEL
Anion gap: 9 (ref 5–15)
BUN: 6 mg/dL (ref 6–20)
CO2: 22 mmol/L (ref 22–32)
Calcium: 7.9 mg/dL — ABNORMAL LOW (ref 8.9–10.3)
Chloride: 96 mmol/L — ABNORMAL LOW (ref 98–111)
Creatinine, Ser: 0.57 mg/dL — ABNORMAL LOW (ref 0.61–1.24)
GFR, Estimated: >60 mL/min (ref >60)
Glucose, Bld: 91 mg/dL (ref 70–99)
Potassium: 3.8 mmol/L (ref 3.5–5.1)
Sodium: 127 mmol/L — ABNORMAL LOW (ref 135–145)

## 2023-06-21 LAB — PHOSPHORUS: Phosphorus: 1.7 mg/dL — ABNORMAL LOW (ref 2.5–4.6)

## 2023-06-21 NOTE — Progress Notes (Signed)
  4 Days Post-Op   Chief Complaint/Subjective: Pain better, tolerating liquids, +flatus, occasional bloating/burping  Objective: Vital signs in last 24 hours: Temp:  [98.4 F (36.9 C)-99.5 F (37.5 C)] 99.5 F (37.5 C) (07/07 0859) Pulse Rate:  [101-107] 101 (07/07 0859) Resp:  [16-21] 18 (07/07 0859) BP: (137-140)/(85-98) 140/85 (07/07 0859) SpO2:  [85 %-100 %] 99 % (07/07 0859) FiO2 (%):  [0 %] 0 % (07/07 0355) Last BM Date : 06/16/23 Intake/Output from previous day: 07/06 0701 - 07/07 0700 In: 1114.6 [I.V.:695.2; IV Piggyback:419.5] Out: 2020 [Urine:1700; Drains:320]  PE: Gen: NAD Resp: nonlabored Card: tachycardic Abd: soft, incisions c/d/I, drain with serous output, g tube clamped  Lab Results:  Recent Labs    06/20/23 0045 06/21/23 0329  WBC 15.9* 13.3*  HGB 9.4* 8.2*  HCT 31.1* 27.1*  PLT 311 320   Recent Labs    06/20/23 0045 06/21/23 0329  NA 131* 127*  K 4.2 3.8  CL 99 96*  CO2 22 22  GLUCOSE 119* 91  BUN 7 6  CREATININE 0.71 0.57*  CALCIUM 8.0* 7.9*   No results for input(s): "LABPROT", "INR" in the last 72 hours.    Component Value Date/Time   NA 127 (L) 06/21/2023 0329   K 3.8 06/21/2023 0329   CL 96 (L) 06/21/2023 0329   CO2 22 06/21/2023 0329   GLUCOSE 91 06/21/2023 0329   BUN 6 06/21/2023 0329   CREATININE 0.57 (L) 06/21/2023 0329   CREATININE 0.94 02/11/2012 1057   CALCIUM 7.9 (L) 06/21/2023 0329   PROT 6.0 (L) 06/20/2023 0045   ALBUMIN 2.8 (L) 06/20/2023 0045   AST 15 06/20/2023 0045   ALT 11 06/20/2023 0045   ALKPHOS 42 06/20/2023 0045   BILITOT 0.5 06/20/2023 0045   GFRNONAA >60 06/21/2023 0329   GFRAA >60 05/16/2019 0611    Assessment/Plan  s/p Procedure(s): ROBOTIC DISTAL GASTRECTOMY INSERTION OF GASTROSTOMY TUBE 06/17/2023   Downtrending hemoglobin Tolerating liquids FEN - advance to soft diet VTE - lovenox ID - zosyn empiracally Disposition - inpatient, slowly advancing diet, pain issues, needs to get out of bed  today   LOS: 4 days   I reviewed last 24 h vitals and pain scores, last 48 h intake and output, last 24 h labs and trends, and last 24 h imaging results.  This care required moderate level of medical decision making.   [facility] at [facility] 06/21/2023, 10:44 AM Please see Amion for pager number during day hours 7:00am-4:30pm or 7:00am -11:30am on weekends

## 2023-06-21 NOTE — Progress Notes (Signed)
Wasted 2ml Dilaudid per PCA. Witnessed Art therapist

## 2023-06-22 LAB — BASIC METABOLIC PANEL
Anion gap: 9 (ref 5–15)
BUN: 7 mg/dL (ref 6–20)
CO2: 24 mmol/L (ref 22–32)
Calcium: 8.1 mg/dL — ABNORMAL LOW (ref 8.9–10.3)
Chloride: 95 mmol/L — ABNORMAL LOW (ref 98–111)
Creatinine, Ser: 0.59 mg/dL — ABNORMAL LOW (ref 0.61–1.24)
GFR, Estimated: >60 mL/min (ref >60)
Glucose, Bld: 104 mg/dL — ABNORMAL HIGH (ref 70–99)
Potassium: 3.8 mmol/L (ref 3.5–5.1)
Sodium: 128 mmol/L — ABNORMAL LOW (ref 135–145)

## 2023-06-22 LAB — CBC
HCT: 27.3 % — ABNORMAL LOW (ref 39.0–52.0)
Hemoglobin: 8.3 g/dL — ABNORMAL LOW (ref 13.0–17.0)
MCH: 22.9 pg — ABNORMAL LOW (ref 26.0–34.0)
MCHC: 30.4 g/dL (ref 30.0–36.0)
MCV: 75.2 fL — ABNORMAL LOW (ref 80.0–100.0)
Platelets: 361 10*3/uL (ref 150–400)
RBC: 3.63 MIL/uL — ABNORMAL LOW (ref 4.22–5.81)
RDW: 17.3 % — ABNORMAL HIGH (ref 11.5–15.5)
WBC: 14.7 10*3/uL — ABNORMAL HIGH (ref 4.0–10.5)
nRBC: 0 % (ref 0.0–0.2)

## 2023-06-22 LAB — SURGICAL PATHOLOGY

## 2023-06-22 MED ORDER — LORAZEPAM 0.5 MG PO TABS: 0.5000 mg | ORAL_TABLET | Freq: Every day | ORAL | Status: DC | PRN

## 2023-06-22 MED ORDER — QUETIAPINE FUMARATE 50 MG PO TABS: 25.0000 mg | ORAL_TABLET | Freq: Every day | ORAL | Status: DC | PRN

## 2023-06-22 MED ORDER — LORAZEPAM 2 MG/ML IJ SOLN: 1.0000 mg | Freq: Two times a day (BID) | INTRAMUSCULAR | Status: DC | PRN

## 2023-06-22 MED ORDER — PANTOPRAZOLE SODIUM 40 MG PO TBEC
40.0000 mg | DELAYED_RELEASE_TABLET | Freq: Every day | ORAL | Status: DC
  Administered 2023-06-22 – 2023-06-25 (×4): 40 mg via ORAL
  Filled 2023-06-22 (×4): qty 1

## 2023-06-22 MED ORDER — LAMOTRIGINE 100 MG PO TABS
200.0000 mg | ORAL_TABLET | Freq: Every day | ORAL | Status: DC
  Administered 2023-06-22 – 2023-06-25 (×4): 200 mg via ORAL
  Filled 2023-06-22 (×4): qty 2

## 2023-06-22 MED ORDER — PRAZOSIN HCL 2 MG PO CAPS
2.0000 mg | ORAL_CAPSULE | Freq: Every day | ORAL | Status: DC
  Filled 2023-06-22 (×4): qty 1

## 2023-06-22 MED ORDER — METHOCARBAMOL 500 MG PO TABS
500.0000 mg | ORAL_TABLET | Freq: Four times a day (QID) | ORAL | Status: DC | PRN
  Administered 2023-06-22 – 2023-06-24 (×4): 500 mg via ORAL
  Filled 2023-06-22 (×4): qty 1

## 2023-06-22 MED ORDER — BUSPIRONE HCL 5 MG PO TABS
30.0000 mg | ORAL_TABLET | Freq: Two times a day (BID) | ORAL | Status: DC
  Administered 2023-06-23 – 2023-06-25 (×5): 30 mg via ORAL
  Filled 2023-06-22 (×5): qty 6

## 2023-06-23 LAB — BASIC METABOLIC PANEL
Anion gap: 9 (ref 5–15)
BUN: 5 mg/dL — ABNORMAL LOW (ref 6–20)
CO2: 25 mmol/L (ref 22–32)
Calcium: 7.8 mg/dL — ABNORMAL LOW (ref 8.9–10.3)
Chloride: 93 mmol/L — ABNORMAL LOW (ref 98–111)
Creatinine, Ser: 0.56 mg/dL — ABNORMAL LOW (ref 0.61–1.24)
GFR, Estimated: >60 mL/min (ref >60)
Glucose, Bld: 96 mg/dL (ref 70–99)
Potassium: 3.5 mmol/L (ref 3.5–5.1)
Sodium: 127 mmol/L — ABNORMAL LOW (ref 135–145)

## 2023-06-23 LAB — CBC
HCT: 26.1 % — ABNORMAL LOW (ref 39.0–52.0)
Hemoglobin: 7.9 g/dL — ABNORMAL LOW (ref 13.0–17.0)
MCH: 22.7 pg — ABNORMAL LOW (ref 26.0–34.0)
MCHC: 30.3 g/dL (ref 30.0–36.0)
MCV: 75 fL — ABNORMAL LOW (ref 80.0–100.0)
Platelets: 405 10*3/uL — ABNORMAL HIGH (ref 150–400)
RBC: 3.48 MIL/uL — ABNORMAL LOW (ref 4.22–5.81)
RDW: 17.6 % — ABNORMAL HIGH (ref 11.5–15.5)
WBC: 14.2 10*3/uL — ABNORMAL HIGH (ref 4.0–10.5)
nRBC: 0 % (ref 0.0–0.2)

## 2023-06-23 MED ORDER — SIMETHICONE 80 MG PO CHEW
80.0000 mg | CHEWABLE_TABLET | Freq: Four times a day (QID) | ORAL | Status: DC | PRN
  Administered 2023-06-23 – 2023-06-24 (×3): 80 mg via ORAL
  Filled 2023-06-23 (×3): qty 1

## 2023-06-23 MED ORDER — HYDROMORPHONE HCL 2 MG PO TABS
2.0000 mg | ORAL_TABLET | ORAL | Status: DC | PRN
  Administered 2023-06-23: 2 mg via ORAL
  Administered 2023-06-23 – 2023-06-25 (×8): 4 mg via ORAL
  Filled 2023-06-23: qty 2
  Filled 2023-06-23: qty 1
  Filled 2023-06-23 (×7): qty 2

## 2023-06-23 MED ORDER — HYDROMORPHONE HCL 1 MG/ML IJ SOLN
0.5000 mg | INTRAMUSCULAR | Status: DC | PRN
  Administered 2023-06-23 (×2): 1 mg via INTRAVENOUS
  Administered 2023-06-24 (×2): 2 mg via INTRAVENOUS
  Filled 2023-06-23: qty 1
  Filled 2023-06-23: qty 2
  Filled 2023-06-23: qty 1
  Filled 2023-06-23: qty 2

## 2023-06-23 MED ORDER — SODIUM CHLORIDE 1 G PO TABS
1.0000 g | ORAL_TABLET | Freq: Three times a day (TID) | ORAL | Status: DC
  Administered 2023-06-23 – 2023-06-25 (×8): 1 g via ORAL
  Filled 2023-06-23 (×10): qty 1

## 2023-06-23 NOTE — Progress Notes (Signed)
  6 Days Post-Op   Chief Complaint/Subjective: Having gas pain.   Objective: Vital signs in last 24 hours: Temp:  [98.6 F (37 C)-99.2 F (37.3 C)] 98.8 F (37.1 C) (07/09 0544) Pulse Rate:  [100-107] 100 (07/09 0544) Resp:  [16-21] 18 (07/09 0356) BP: (134-141)/(84-92) 136/85 (07/09 0544) SpO2:  [93 %-100 %] 99 % (07/09 0544) Last BM Date : 06/16/23 Intake/Output from previous day: 07/08 0701 - 07/09 0700 In: 1500.4 [P.O.:960; I.V.:150.5; IV Piggyback:389.9] Out: 1108 [Urine:1050; Drains:58]  PE: Gen: NAD Resp: nonlabored Card: tachycardic Abd: soft, incisions c/d/I, drain with serous output, g tube clamped  Lab Results:  Recent Labs    06/22/23 0341 06/23/23 0117  WBC 14.7* 14.2*  HGB 8.3* 7.9*  HCT 27.3* 26.1*  PLT 361 405*   Recent Labs    06/22/23 0341 06/23/23 0117  NA 128* 127*  K 3.8 3.5  CL 95* 93*  CO2 24 25  GLUCOSE 104* 96  BUN 7 5*  CREATININE 0.59* 0.56*  CALCIUM 8.1* 7.8*   No results for input(s): "LABPROT", "INR" in the last 72 hours.    Component Value Date/Time   NA 127 (L) 06/23/2023 0117   K 3.5 06/23/2023 0117   CL 93 (L) 06/23/2023 0117   CO2 25 06/23/2023 0117   GLUCOSE 96 06/23/2023 0117   BUN 5 (L) 06/23/2023 0117   CREATININE 0.56 (L) 06/23/2023 0117   CREATININE 0.94 02/11/2012 1057   CALCIUM 7.8 (L) 06/23/2023 0117   PROT 6.0 (L) 06/20/2023 0045   ALBUMIN 2.8 (L) 06/20/2023 0045   AST 15 06/20/2023 0045   ALT 11 06/20/2023 0045   ALKPHOS 42 06/20/2023 0045   BILITOT 0.5 06/20/2023 0045   GFRNONAA >60 06/23/2023 0117   GFRAA >60 05/16/2019 0611    Assessment/Plan  s/p Procedure(s): ROBOTIC DISTAL GASTRECTOMY INSERTION OF GASTROSTOMY TUBE 06/17/2023   ABL anemia Hyponatremia Tolerating soft diet VTE - lovenox ID - zosyn empirically Disposition - inpatient, d/c PCA today. Work on ambulation. Simethicone for gas   LOS: 6 days   I reviewed last 24 h vitals and pain scores, last 48 h intake and output, last 24  h labs and trends, and last 24 h imaging results.  This care required moderate level of medical decision making.   Maricela Bo Surgery at [facility] 06/23/2023, 7:10 AM Please see Amion for pager number during day hours 7:00am-4:30pm or 7:00am -11:30am on weekends

## 2023-06-23 NOTE — Progress Notes (Signed)
Pt states they no longer take minipress

## 2023-06-24 LAB — BASIC METABOLIC PANEL
Anion gap: 12 (ref 5–15)
BUN: 7 mg/dL (ref 6–20)
CO2: 21 mmol/L — ABNORMAL LOW (ref 22–32)
Calcium: 8 mg/dL — ABNORMAL LOW (ref 8.9–10.3)
Chloride: 96 mmol/L — ABNORMAL LOW (ref 98–111)
Creatinine, Ser: 0.71 mg/dL (ref 0.61–1.24)
GFR, Estimated: >60 mL/min (ref >60)
Glucose, Bld: 88 mg/dL (ref 70–99)
Potassium: 3.4 mmol/L — ABNORMAL LOW (ref 3.5–5.1)
Sodium: 129 mmol/L — ABNORMAL LOW (ref 135–145)

## 2023-06-24 LAB — CBC
HCT: 27.1 % — ABNORMAL LOW (ref 39.0–52.0)
Hemoglobin: 8.2 g/dL — ABNORMAL LOW (ref 13.0–17.0)
MCH: 23 pg — ABNORMAL LOW (ref 26.0–34.0)
MCHC: 30.3 g/dL (ref 30.0–36.0)
MCV: 75.9 fL — ABNORMAL LOW (ref 80.0–100.0)
Platelets: 459 10*3/uL — ABNORMAL HIGH (ref 150–400)
RBC: 3.57 MIL/uL — ABNORMAL LOW (ref 4.22–5.81)
RDW: 17.7 % — ABNORMAL HIGH (ref 11.5–15.5)
WBC: 14 10*3/uL — ABNORMAL HIGH (ref 4.0–10.5)
nRBC: 0 % (ref 0.0–0.2)

## 2023-06-24 MED ORDER — POLYETHYLENE GLYCOL 3350 17 G PO PACK
17.0000 g | PACK | Freq: Every day | ORAL | Status: DC
  Administered 2023-06-24 – 2023-06-25 (×2): 17 g via ORAL
  Filled 2023-06-24 (×2): qty 1

## 2023-06-24 MED ORDER — SENNA 8.6 MG PO TABS
2.0000 | ORAL_TABLET | Freq: Every day | ORAL | Status: DC
  Administered 2023-06-24: 17.2 mg via ORAL
  Filled 2023-06-24: qty 2

## 2023-06-24 NOTE — Progress Notes (Signed)
  7 Days Post-Op   Chief Complaint/Subjective: Pain improved today.  Off PCA.  No BM yet.    Objective: Vital signs in last 24 hours: Temp:  [98.2 F (36.8 C)-98.9 F (37.2 C)] 98.2 F (36.8 C) (07/10 0854) Pulse Rate:  [104-109] 109 (07/10 0854) Resp:  [17-18] 17 (07/10 0854) BP: (125-143)/(84-92) 143/89 (07/10 0854) SpO2:  [97 %-99 %] 99 % (07/10 0854) Last BM Date : 06/16/23 Intake/Output from previous day: 07/09 0701 - 07/10 0700 In: 548.7 [P.O.:240; IV Piggyback:258.7] Out: 1420 [Urine:1270; Drains:150]  PE: Gen: NAD Resp: nonlabored Card: tachycardic Abd: soft, incisions c/d/I, drain with serous output, g tube clamped  Lab Results:  Recent Labs    06/23/23 0117 06/24/23 0311  WBC 14.2* 14.0*  HGB 7.9* 8.2*  HCT 26.1* 27.1*  PLT 405* 459*   Recent Labs    06/23/23 0117 06/24/23 0311  NA 127* 129*  K 3.5 3.4*  CL 93* 96*  CO2 25 21*  GLUCOSE 96 88  BUN 5* 7  CREATININE 0.56* 0.71  CALCIUM 7.8* 8.0*   No results for input(s): "LABPROT", "INR" in the last 72 hours.    Component Value Date/Time   NA 129 (L) 06/24/2023 0311   K 3.4 (L) 06/24/2023 0311   CL 96 (L) 06/24/2023 0311   CO2 21 (L) 06/24/2023 0311   GLUCOSE 88 06/24/2023 0311   BUN 7 06/24/2023 0311   CREATININE 0.71 06/24/2023 0311   CREATININE 0.94 02/11/2012 1057   CALCIUM 8.0 (L) 06/24/2023 0311   PROT 6.0 (L) 06/20/2023 0045   ALBUMIN 2.8 (L) 06/20/2023 0045   AST 15 06/20/2023 0045   ALT 11 06/20/2023 0045   ALKPHOS 42 06/20/2023 0045   BILITOT 0.5 06/20/2023 0045   GFRNONAA >60 06/24/2023 0311   GFRAA >60 05/16/2019 0611    Assessment/Plan  s/p Procedure(s): ROBOTIC DISTAL GASTRECTOMY INSERTION OF GASTROSTOMY TUBE 06/17/2023   ABL anemia Hyponatremia- improved.  Tolerating soft diet VTE - lovenox ID - zosyn empirically 7 days, to stp tomorrow.   Disposition - inpatient, bowel regimen. Hope to d/c tomorrow. Discussed with pain management clinic - plan to d/c on subutex  and oral dilaudid with early follow up.    LOS: 7 days   I reviewed last 24 h vitals and pain scores, last 48 h intake and output, last 24 h labs and trends, and last 24 h imaging results.  This care required moderate level of medical decision making.   Maricela Bo Surgery at [facility] 06/24/2023, 3:03 PM Please see Amion for pager number during day hours 7:00am-4:30pm or 7:00am -11:30am on weekends

## 2023-06-24 NOTE — Progress Notes (Signed)
MEWS Progress Note  Patient Details Name: WILLIAN DONSON MRN: [id number] DOB: January 02, 1981 Today's Date: 06/24/2023   MEWS Flowsheet Documentation:  Assess: MEWS Score Temp: 99.1 F (37.3 C) BP: 137/87 MAP (mmHg): 102 Pulse Rate: (!) 116 ECG Heart Rate: 97 Resp: 18 Level of Consciousness: Alert SpO2: 99 % O2 Device: Room Air Patient Activity (if Appropriate): In bed O2 Flow Rate (L/min): 2 L/min FiO2 (%): (!) 0 % Assess: MEWS Score MEWS Temp: 0 MEWS Systolic: 0 MEWS Pulse: 2 MEWS RR: 0 MEWS LOC: 0 MEWS Score: 2 MEWS Score Color: Yellow Assess: SIRS CRITERIA SIRS Temperature : 0 SIRS Respirations : 0 SIRS Pulse: 1 SIRS WBC: 0 SIRS Score Sum : 1 Assess: if the MEWS score is Yellow or Red Were vital signs taken at a resting state?: Yes Focused Assessment: No change from prior assessment Does the patient meet 2 or more of the SIRS criteria?: No MEWS guidelines implemented : Yes, yellow Treat MEWS Interventions: Considered administering scheduled or prn medications/treatments as ordered Take Vital Signs Increase Vital Sign Frequency : Yellow: Q2hr x1, continue Q4hrs until patient remains green for 12hrs Escalate MEWS: Escalate: Yellow: Discuss with charge nurse and consider notifying provider and/or RRT Notify: Charge Nurse/RN Name of Charge Nurse/RN Notified: Raquel Provider Notification Provider Name/Title: [name], MD Date Provider Notified: 06/24/23 Time Provider Notified: 1836 Method of Notification: Page Notification Reason: Other (Comment) (Yellow MEWS) Provider response: No new orders Date of Provider Response: 06/24/23 Time of Provider Response: 1847      Tristain Daily A Mindie Rawdon 06/24/2023, 6:49 PM

## 2023-06-25 ENCOUNTER — Other Ambulatory Visit (HOSPITAL_COMMUNITY): Payer: Self-pay

## 2023-06-25 MED ORDER — PROMETHAZINE HCL 12.5 MG PO TABS
12.5000 mg | ORAL_TABLET | Freq: Four times a day (QID) | ORAL | 0 refills | Status: DC | PRN
  Filled 2023-06-25: qty 30, 8d supply, fill #0

## 2023-06-25 MED ORDER — HYDROMORPHONE HCL 2 MG PO TABS
2.0000 mg | ORAL_TABLET | ORAL | 0 refills | Status: DC | PRN
  Filled 2023-06-25: qty 84, 7d supply, fill #0

## 2023-06-25 NOTE — Discharge Summary (Signed)
Physician Discharge Summary  Patient ID: Alan Jennings MRN: [id number] DOB/AGE: [birth year] 42 y.o.  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  His duodenum was extremely fibrotic and was not able to be stapled due to the thickness of the tissue.  I dissected as far as I could safely avoiding the bile duct and the pancreas.  The duodenum was divided with the cautery and closed down with running 2-0 V-Loc sutures.  Because of this a drain was placed near the duodenal stump.  On postop day 1 the patient had uncontrolled pain.  This was not surprising due to his history of Subutex treatment.  He was placed on a Dilaudid PCA.  Lovenox was started on postop day 1.    Because of his shortness of breath, extreme tachycardia with heart rates up to 140 and severe pain, he was treated empirically with Zosyn for possible duodenal stump leak.  His Subutex was increased. Also, his Dilaudid PCA was adjusted.  His G-tube was clamped on postop day 2.  He was started on clears.  His diet was able to be advanced and he tolerated G-tube clamping.  He developed gas pain and was started on simethicone.  His PCA was stopped and he was placed on oral Dilaudid.  I spoke with his outpatient nurse practitioner who manages his Subutex treatment for prior addiction.  He agreed that sending the patient home with baseline Subutex and as needed Dilaudid was reasonable.  The medication assisted treatment provider is going to follow-up with patient in 1 week.  Our goal is to have the patient manage completely on Subutex and stop the Dilaudid as soon as  possible.  The patient had some mild hyponatremia and was started on salt tabs.  His fluid was then restricted.  He was discharged home on postop day 8 in stable condition.  The patient was taught G-tube care and how to hook the G-tube up to a bag if he starts feeling nauseated.  He did have a bowel movement prior to discharge.  Consults: None  Significant Diagnostic Studies: labs: HCT 27.1 prior to d/c.  WBCs stable at 14.  Na up to 129 prior to d/c.   Treatments: surgery: see above  Discharge Exam: Blood pressure 126/83, pulse (!) 116, temperature 98.4 F (36.9 C), temperature source Oral, resp. rate 18, height 5\' 8"  (1.727 m), weight 60.1 kg, SpO2 95%. General appearance: alert, cooperative, and no distress Resp: breathing comfortably GI: soft, non distended, g tube with stopcock, incisions c/d/I.   Extremities: extremities normal, atraumatic, no cyanosis or edema  Disposition: Discharge disposition: 01-Home or Self Care       Discharge Instructions     Call MD for:  difficulty breathing, headache or visual disturbances   Complete by: As directed    Call MD for:  hives   Complete by: As directed    Call MD for:  persistant nausea and vomiting   Complete by: As directed    Call MD for:  redness, tenderness, or signs of infection (pain, swelling, redness, odor or green/yellow discharge around incision site)   Complete by: As directed    Call MD for:  severe uncontrolled  pain   Complete by: As directed    Call MD for:  temperature >100.4   Complete by: As directed    Change dressing (specify)   Complete by: As directed    Ok to remove drainage bag from g tube and cap off.  If persistent n/v, ok to open to the bag.   Diet - low sodium heart healthy   Complete by: As directed    Increase activity slowly   Complete by: As directed       Allergies as of 06/25/2023       Reactions   Tramadol Other (See Comments)   SEIZURES   Nsaids Other (See Comments)   Has caused  ulcers and needs to avoid        Medication List     STOP taking these medications    escitalopram 10 MG tablet Commonly known as: Lexapro       TAKE these medications    acetaminophen 500 MG tablet Commonly known as: TYLENOL Take 500-1,000 mg by mouth every 6 (six) hours as needed (for headaches).   buprenorphine 8 MG Subl SL tablet Commonly known as: SUBUTEX Dissolve 1 tablet in mouth 3 times daily   busPIRone 30 MG tablet Commonly known as: BUSPAR Take 1 tablet (30 mg) by mouth 2 times daily with a meal. What changed: Another medication with the same name was removed. Continue taking this medication, and follow the directions you see here.   calcium carbonate 500 MG chewable tablet Commonly known as: TUMS - dosed in mg elemental calcium Chew 1 tablet by mouth daily as needed for indigestion or heartburn.   Emetrol 230 MG Chew Generic drug: Sodium Citrate Dihydrate Chew 460 mg by mouth 2 (two) times daily as needed (for nausea).   HYDROmorphone 2 MG tablet Commonly known as: DILAUDID Take 1-2 tablets (2-4 mg total) by mouth every 4 (four) hours as needed for moderate pain or severe pain.   lamoTRIgine 200 MG tablet Commonly known as: LAMICTAL Take 1 tablet (200 mg total) by mouth daily.   LORazepam 0.5 MG tablet Commonly known as: ATIVAN Take 1 tablet (0.5 mg total) by mouth daily as needed for severe anxiety (must last 30 days per md)   pantoprazole 40 MG tablet Commonly known as: Protonix Take 1 tablet (40 mg total) by mouth daily.   prazosin 2 MG capsule Commonly known as: MINIPRESS Take 1 capsule (2 mg) by mouth at bedtime.   promethazine 12.5 MG tablet Commonly known as: PHENERGAN Take 1 tablet (12.5 mg total) by mouth every 6 (six) hours as needed for nausea or vomiting.   QUEtiapine 25 MG tablet Commonly known as: SEROquel Take 1 tablet (25 mg) by mouth daily as needed for insomnia   sucralfate 1 g tablet Commonly known as: Carafate Take 1  tablet (1 g total) by mouth 4 (four) times daily.               Discharge Care Instructions  (From admission, onward)           Start     Ordered   06/25/23 0000  Change dressing (specify)       Comments: Ok to remove drainage bag from g tube and cap off.  If persistent n/v, ok to open to the bag.   06/25/23 1610            Follow-up Information     Almond Lint, MD Follow up in 2 week(s).   Specialty:  General Surgery Contact information: [address] [place] [address] [phone]         Minerva Ends, NP Follow up in 1 week(s).   Specialty: Behavioral Health Contact information: [phone]  [address] [place] [address] [phone]                 Signed: Almond Lint 06/25/2023, 10:53 AM

## 2023-06-25 NOTE — Plan of Care (Signed)

## 2023-06-25 NOTE — Plan of Care (Signed)

## 2023-06-25 NOTE — Discharge Instructions (Signed)
[facility]      [facility], [place] [phone]  ABDOMINAL SURGERY: POST OP INSTRUCTIONS  Always review your discharge instruction sheet given to you by the facility where your surgery was performed.  IF YOU HAVE DISABILITY OR FAMILY LEAVE FORMS, YOU MUST BRING THEM TO THE OFFICE FOR PROCESSING.  PLEASE DO NOT GIVE THEM TO YOUR DOCTOR.  A prescription for pain medication may be given to you upon discharge.  Take your pain medication as prescribed, if needed.  If narcotic pain medicine is not needed, then you may take acetaminophen (Tylenol) or ibuprofen (Advil) as needed. Take your usually prescribed medications unless otherwise directed. If you need a refill on your pain medication, please contact your pharmacy. They will contact our office to request authorization.  Prescriptions will not be filled after 5pm or on week-ends. You should follow a light diet the first few days after arrival home, such as soup and crackers, pudding, etc.unless your doctor has advised otherwise. A high-fiber, low fat diet can be resumed as tolerated.   Be sure to include lots of fluids daily. Most patients will experience some swelling and bruising on the chest and neck area.  Ice packs will help.  Swelling and bruising can take several days to resolve Most patients will experience some swelling and bruising in the area of the incision. Ice pack will help. Swelling and bruising can take several days to resolve..  It is common to experience some constipation if taking pain medication after surgery.  Increasing fluid intake and taking a stool softener will usually help or prevent this problem from occurring.  A mild laxative (Milk of Magnesia or Miralax) should be taken according to package directions if there are no bowel movements after 48 hours.  You may have steri-strips (small skin tapes) in place directly over the incision.  These strips should be left on the skin for 10-14 days.  If your surgeon used skin glue on  the incision, you may shower in 48 hours.  The glue will flake off over the next 2-3 weeks.  Any sutures or staples will be removed at the office during your follow-up visit. You may find that a light gauze bandage over your incision may keep your staples from being rubbed or pulled. You may shower and replace the bandage daily. ACTIVITIES:  You may resume regular (light) daily activities beginning the next day--such as daily self-care, walking, climbing stairs--gradually increasing activities as tolerated.  You may have sexual intercourse when it is comfortable.  Refrain from any heavy lifting or straining until approved by your doctor. You may drive when you no longer are taking prescription pain medication, you can comfortably wear a seatbelt, and you can safely maneuver your car and apply brakes Return to Work: __________6 weeks if applicable_________________________ Alan Jennings should see your doctor in the office for a follow-up appointment approximately two weeks after your surgery.  Make sure that you call for this appointment within a day or two after you arrive home to insure a convenient appointment time. OTHER INSTRUCTIONS:  _____________________________________________________________ _____________________________________________________________  WHEN TO CALL YOUR DOCTOR: Fever over 101.0 Inability to urinate Nausea and/or vomiting Extreme swelling or bruising Continued bleeding from incision. Increased pain, redness, or drainage from the incision. Difficulty swallowing or breathing Muscle cramping or spasms. Numbness or tingling in hands or feet or around lips.  The clinic staff is available to answer your questions during regular business hours.  Please don't hesitate to call and ask to speak to  one of the nurses if you have concerns.  For further questions, please visit [email or web address]

## 2023-06-25 NOTE — Progress Notes (Signed)
   06/25/23 0750  Assess: MEWS Score  Temp 98.4 F (36.9 C)  BP 126/83  MAP (mmHg) 94  Pulse Rate (!) 116 (notified LPN)  Resp 18  SpO2 95 %  O2 Device Room Air  Assess: MEWS Score  MEWS Temp 0  MEWS Systolic 0  MEWS Pulse 2  MEWS RR 0  MEWS LOC 0  MEWS Score 2  MEWS Score Color Yellow  Assess: if the MEWS score is Yellow or Red  Were vital signs taken at a resting state? Yes  Focused Assessment No change from prior assessment  Does the patient meet 2 or more of the SIRS criteria? No  MEWS guidelines implemented  No, previously yellow, continue vital signs every 4 hours  Provider Notification  Provider Name/Title byerly  Date Provider Notified 06/25/23  Time Provider Notified [phone]  Method of Notification Face-to-face  Provider response No new orders  Date of Provider Response 06/25/23  Time of Provider Response 0806  Assess: SIRS CRITERIA  SIRS Temperature  0  SIRS Pulse 1  SIRS Respirations  0  SIRS WBC 0  SIRS Score Sum  1

## 2023-06-25 NOTE — Progress Notes (Signed)
Bonna Gains to be D/C'd  per MD order.  Discussed with the patient and all questions fully answered.  VSS, Skin clean, dry and intact without evidence of skin break down, no evidence of skin tears noted.  IV catheter discontinued intact. Site without signs and symptoms of complications. Dressing and pressure applied.  An After Visit Summary was printed and given to the patient. Patient received prescription form [facility]OC pharmacy. Pt receive saline flushes and spilt gauze.  D/c education completed with patient/family including follow up instructions, medication list, d/c activities limitations if indicated, with other d/c instructions as indicated by MD - patient able to verbalize understanding, all questions fully answered.   Patient instructed to return to ED, call 911, or call MD for any changes in condition.   Patient to be escorted via WC, and D/C home via private auto.

## 2023-06-30 ENCOUNTER — Inpatient Hospital Stay (HOSPITAL_COMMUNITY): Payer: Medicaid Other

## 2023-06-30 ENCOUNTER — Inpatient Hospital Stay (HOSPITAL_COMMUNITY)
Admission: EM | Admit: 2023-06-30 | Discharge: 2023-07-07 | DRG: 393 | Disposition: A | Discharge status: Home / Self Care | Payer: Medicaid Other | Attending: General Surgery | Admitting: General Surgery

## 2023-06-30 ENCOUNTER — Emergency Department (HOSPITAL_COMMUNITY): Payer: Medicaid Other

## 2023-06-30 ENCOUNTER — Encounter (HOSPITAL_COMMUNITY): Payer: Self-pay

## 2023-06-30 ENCOUNTER — Other Ambulatory Visit: Payer: Self-pay

## 2023-06-30 DIAGNOSIS — K30 Functional dyspepsia: Secondary | ICD-10-CM | POA: Diagnosis present

## 2023-06-30 DIAGNOSIS — F1120 Opioid dependence, uncomplicated: Secondary | ICD-10-CM | POA: Diagnosis present

## 2023-06-30 DIAGNOSIS — T39395A Adverse effect of other nonsteroidal anti-inflammatory drugs [NSAID], initial encounter: Secondary | ICD-10-CM | POA: Diagnosis present

## 2023-06-30 DIAGNOSIS — G40909 Epilepsy, unspecified, not intractable, without status epilepticus: Secondary | ICD-10-CM | POA: Diagnosis present

## 2023-06-30 DIAGNOSIS — F32A Depression, unspecified: Secondary | ICD-10-CM | POA: Diagnosis present

## 2023-06-30 DIAGNOSIS — K267 Chronic duodenal ulcer without hemorrhage or perforation: Secondary | ICD-10-CM | POA: Diagnosis present

## 2023-06-30 DIAGNOSIS — K219 Gastro-esophageal reflux disease without esophagitis: Secondary | ICD-10-CM | POA: Diagnosis present

## 2023-06-30 DIAGNOSIS — Z79899 Other long term (current) drug therapy: Secondary | ICD-10-CM

## 2023-06-30 DIAGNOSIS — F419 Anxiety disorder, unspecified: Secondary | ICD-10-CM | POA: Diagnosis present

## 2023-06-30 DIAGNOSIS — G8918 Other acute postprocedural pain: Principal | ICD-10-CM

## 2023-06-30 DIAGNOSIS — Z681 Body mass index (BMI) 19 or less, adult: Secondary | ICD-10-CM | POA: Diagnosis not present

## 2023-06-30 DIAGNOSIS — E860 Dehydration: Secondary | ICD-10-CM | POA: Diagnosis present

## 2023-06-30 DIAGNOSIS — N179 Acute kidney failure, unspecified: Secondary | ICD-10-CM | POA: Diagnosis present

## 2023-06-30 DIAGNOSIS — K9189 Other postprocedural complications and disorders of digestive system: Principal | ICD-10-CM | POA: Diagnosis present

## 2023-06-30 DIAGNOSIS — Z885 Allergy status to narcotic agent status: Secondary | ICD-10-CM

## 2023-06-30 DIAGNOSIS — E876 Hypokalemia: Secondary | ICD-10-CM | POA: Diagnosis not present

## 2023-06-30 DIAGNOSIS — Z931 Gastrostomy status: Secondary | ICD-10-CM | POA: Diagnosis not present

## 2023-06-30 DIAGNOSIS — E871 Hypo-osmolality and hyponatremia: Secondary | ICD-10-CM | POA: Diagnosis present

## 2023-06-30 DIAGNOSIS — D638 Anemia in other chronic diseases classified elsewhere: Secondary | ICD-10-CM | POA: Diagnosis present

## 2023-06-30 DIAGNOSIS — I10 Essential (primary) hypertension: Secondary | ICD-10-CM | POA: Diagnosis present

## 2023-06-30 DIAGNOSIS — K5900 Constipation, unspecified: Secondary | ICD-10-CM | POA: Diagnosis present

## 2023-06-30 DIAGNOSIS — K3184 Gastroparesis: Secondary | ICD-10-CM | POA: Diagnosis present

## 2023-06-30 DIAGNOSIS — E43 Unspecified severe protein-calorie malnutrition: Secondary | ICD-10-CM | POA: Diagnosis present

## 2023-06-30 DIAGNOSIS — Z87891 Personal history of nicotine dependence: Secondary | ICD-10-CM

## 2023-06-30 DIAGNOSIS — F4310 Post-traumatic stress disorder, unspecified: Secondary | ICD-10-CM | POA: Diagnosis present

## 2023-06-30 DIAGNOSIS — K311 Adult hypertrophic pyloric stenosis: Secondary | ICD-10-CM

## 2023-06-30 DIAGNOSIS — Y848 Other medical procedures as the cause of abnormal reaction of the patient, or of later complication, without mention of misadventure at the time of the procedure: Secondary | ICD-10-CM | POA: Diagnosis present

## 2023-06-30 DIAGNOSIS — Z886 Allergy status to analgesic agent status: Secondary | ICD-10-CM

## 2023-06-30 DIAGNOSIS — D75839 Thrombocytosis, unspecified: Secondary | ICD-10-CM | POA: Diagnosis present

## 2023-06-30 DIAGNOSIS — R633 Feeding difficulties, unspecified: Secondary | ICD-10-CM | POA: Diagnosis present

## 2023-06-30 DIAGNOSIS — Y738 Miscellaneous gastroenterology and urology devices associated with adverse incidents, not elsewhere classified: Secondary | ICD-10-CM | POA: Diagnosis present

## 2023-06-30 LAB — COMPREHENSIVE METABOLIC PANEL
ALT: 18 U/L (ref 0–44)
AST: 21 U/L (ref 15–41)
Albumin: 3.7 g/dL (ref 3.5–5.0)
Alkaline Phosphatase: 99 U/L (ref 38–126)
Anion gap: 21 — ABNORMAL HIGH (ref 5–15)
BUN: 27 mg/dL — ABNORMAL HIGH (ref 6–20)
CO2: 30 mmol/L (ref 22–32)
Calcium: 9.2 mg/dL (ref 8.9–10.3)
Chloride: 79 mmol/L — ABNORMAL LOW (ref 98–111)
Creatinine, Ser: 0.92 mg/dL (ref 0.61–1.24)
GFR, Estimated: >60 mL/min (ref >60)
Glucose, Bld: 103 mg/dL — ABNORMAL HIGH (ref 70–99)
Potassium: 3 mmol/L — ABNORMAL LOW (ref 3.5–5.1)
Sodium: 130 mmol/L — ABNORMAL LOW (ref 135–145)
Total Bilirubin: 0.6 mg/dL (ref 0.3–1.2)
Total Protein: 8.8 g/dL — ABNORMAL HIGH (ref 6.5–8.1)

## 2023-06-30 LAB — CBC WITH DIFFERENTIAL/PLATELET
Abs Immature Granulocytes: 0.04 10*3/uL (ref 0.00–0.07)
Basophils Absolute: 0 10*3/uL (ref 0.0–0.1)
Basophils Relative: 0 %
Eosinophils Absolute: 0 10*3/uL (ref 0.0–0.5)
Eosinophils Relative: 0 %
HCT: 30.6 % — ABNORMAL LOW (ref 39.0–52.0)
Hemoglobin: 9.2 g/dL — ABNORMAL LOW (ref 13.0–17.0)
Immature Granulocytes: 0 %
Lymphocytes Relative: 16 %
Lymphs Abs: 1.6 10*3/uL (ref 0.7–4.0)
MCH: 22.5 pg — ABNORMAL LOW (ref 26.0–34.0)
MCHC: 30.1 g/dL (ref 30.0–36.0)
MCV: 74.8 fL — ABNORMAL LOW (ref 80.0–100.0)
Monocytes Absolute: 0.5 10*3/uL (ref 0.1–1.0)
Monocytes Relative: 5 %
Neutro Abs: 7.7 10*3/uL (ref 1.7–7.7)
Neutrophils Relative %: 79 %
Platelets: 1270 10*3/uL (ref 150–400)
RBC: 4.09 MIL/uL — ABNORMAL LOW (ref 4.22–5.81)
RDW: 18.2 % — ABNORMAL HIGH (ref 11.5–15.5)
WBC: 9.9 10*3/uL (ref 4.0–10.5)
nRBC: 0 % (ref 0.0–0.2)

## 2023-06-30 LAB — I-STAT CG4 LACTIC ACID, ED
Lactic Acid, Venous: 1.6 mmol/L (ref 0.5–1.9)
Lactic Acid, Venous: 1 mmol/L (ref 0.5–1.9)

## 2023-06-30 LAB — LIPASE, BLOOD: Lipase: 115 U/L — ABNORMAL HIGH (ref 11–51)

## 2023-06-30 LAB — PROTIME-INR
INR: 1 (ref 0.8–1.2)
Prothrombin Time: 13.7 seconds (ref 11.4–15.2)

## 2023-06-30 MED ORDER — BUSPIRONE HCL 5 MG PO TABS: 30.0000 mg | ORAL_TABLET | Freq: Two times a day (BID) | ORAL | Status: DC

## 2023-06-30 MED ORDER — MORPHINE SULFATE (PF) 4 MG/ML IV SOLN: 4.0000 mg | Freq: Once | INTRAVENOUS | Status: DC

## 2023-06-30 MED ORDER — BUPRENORPHINE HCL 8 MG SL SUBL
8.0000 mg | SUBLINGUAL_TABLET | Freq: Three times a day (TID) | SUBLINGUAL | Status: DC
  Administered 2023-06-30 – 2023-07-07 (×21): 8 mg via SUBLINGUAL
  Filled 2023-06-30 (×21): qty 1

## 2023-06-30 MED ORDER — POLYETHYLENE GLYCOL 3350 17 G PO PACK
17.0000 g | PACK | Freq: Every day | ORAL | Status: DC
  Administered 2023-07-03 – 2023-07-05 (×3): 17 g via ORAL
  Filled 2023-06-30 (×7): qty 1

## 2023-06-30 MED ORDER — ONDANSETRON HCL 4 MG/2ML IJ SOLN: 4.0000 mg | Freq: Four times a day (QID) | INTRAMUSCULAR | Status: DC | PRN

## 2023-06-30 MED ORDER — HYDROMORPHONE HCL 1 MG/ML IJ SOLN
0.5000 mg | INTRAMUSCULAR | Status: DC | PRN
  Administered 2023-06-30 – 2023-07-05 (×19): 1 mg via INTRAVENOUS
  Filled 2023-06-30 (×21): qty 1

## 2023-06-30 MED ORDER — METHOCARBAMOL 500 MG PO TABS
500.0000 mg | ORAL_TABLET | Freq: Three times a day (TID) | ORAL | Status: DC | PRN
  Administered 2023-07-02 (×2): 500 mg via ORAL
  Filled 2023-06-30 (×2): qty 1

## 2023-06-30 MED ORDER — ONDANSETRON 4 MG PO TBDP: 4.0000 mg | ORAL_TABLET | Freq: Four times a day (QID) | ORAL | Status: DC | PRN

## 2023-06-30 MED ORDER — LORAZEPAM 0.5 MG PO TABS: 0.5000 mg | ORAL_TABLET | Freq: Every day | ORAL | Status: DC | PRN

## 2023-06-30 MED ORDER — IOHEXOL 350 MG/ML SOLN
75.0000 mL | Freq: Once | INTRAVENOUS | Status: AC | PRN
  Administered 2023-06-30: 75 mL via INTRAVENOUS

## 2023-06-30 MED ORDER — DIPHENHYDRAMINE HCL 12.5 MG/5ML PO ELIX: 12.5000 mg | ORAL_SOLUTION | Freq: Four times a day (QID) | ORAL | Status: DC | PRN

## 2023-06-30 MED ORDER — METOCLOPRAMIDE HCL 5 MG/ML IJ SOLN
10.0000 mg | Freq: Four times a day (QID) | INTRAMUSCULAR | Status: AC
  Administered 2023-07-01 – 2023-07-03 (×12): 10 mg via INTRAVENOUS
  Filled 2023-06-30 (×12): qty 2

## 2023-06-30 MED ORDER — QUETIAPINE FUMARATE 50 MG PO TABS
25.0000 mg | ORAL_TABLET | Freq: Every evening | ORAL | Status: DC | PRN
  Filled 2023-06-30: qty 1

## 2023-06-30 MED ORDER — SODIUM CHLORIDE 0.9 % IV BOLUS: 1000.0000 mL | Freq: Once | INTRAVENOUS | Status: DC

## 2023-06-30 MED ORDER — PANTOPRAZOLE SODIUM 40 MG IV SOLR
40.0000 mg | Freq: Every day | INTRAVENOUS | Status: DC
  Administered 2023-06-30 – 2023-07-06 (×7): 40 mg via INTRAVENOUS
  Filled 2023-06-30 (×7): qty 10

## 2023-06-30 MED ORDER — POTASSIUM CHLORIDE IN NACL 20-0.9 MEQ/L-% IV SOLN
INTRAVENOUS | Status: AC
  Filled 2023-06-30 (×4): qty 1000

## 2023-06-30 MED ORDER — ENOXAPARIN SODIUM 40 MG/0.4ML IJ SOSY
40.0000 mg | PREFILLED_SYRINGE | INTRAMUSCULAR | Status: DC
  Administered 2023-06-30 – 2023-07-06 (×7): 40 mg via SUBCUTANEOUS
  Filled 2023-06-30 (×7): qty 0.4

## 2023-06-30 MED ORDER — SENNA 8.6 MG PO TABS
1.0000 | ORAL_TABLET | Freq: Two times a day (BID) | ORAL | Status: DC
  Administered 2023-06-30 – 2023-07-07 (×14): 8.6 mg via ORAL
  Filled 2023-06-30 (×13): qty 1

## 2023-06-30 MED ORDER — CALCIUM CARBONATE ANTACID 500 MG PO CHEW
1.0000 | CHEWABLE_TABLET | Freq: Every day | ORAL | Status: DC | PRN
  Administered 2023-07-01: 200 mg via ORAL
  Filled 2023-06-30: qty 1

## 2023-06-30 MED ORDER — DIPHENHYDRAMINE HCL 50 MG/ML IJ SOLN: 12.5000 mg | Freq: Four times a day (QID) | INTRAMUSCULAR | Status: DC | PRN

## 2023-06-30 MED ORDER — HYDROMORPHONE HCL 1 MG/ML IJ SOLN
1.0000 mg | Freq: Once | INTRAMUSCULAR | Status: AC
  Administered 2023-06-30: 1 mg via INTRAVENOUS
  Filled 2023-06-30: qty 1

## 2023-06-30 MED ORDER — HYDROMORPHONE HCL 2 MG PO TABS
2.0000 mg | ORAL_TABLET | ORAL | Status: DC | PRN
  Administered 2023-07-01: 2 mg via ORAL
  Administered 2023-07-05 – 2023-07-07 (×10): 4 mg via ORAL
  Filled 2023-06-30 (×4): qty 2
  Filled 2023-06-30: qty 1
  Filled 2023-06-30 (×4): qty 2
  Filled 2023-06-30: qty 1
  Filled 2023-06-30 (×2): qty 2

## 2023-06-30 MED ORDER — SODIUM CHLORIDE 0.9 % IV BOLUS
1000.0000 mL | Freq: Once | INTRAVENOUS | Status: AC
  Administered 2023-06-30: 1000 mL via INTRAVENOUS

## 2023-06-30 MED ORDER — METHOCARBAMOL 1000 MG/10ML IJ SOLN: 500.0000 mg | Freq: Three times a day (TID) | INTRAVENOUS | Status: DC | PRN

## 2023-06-30 MED ORDER — LAMOTRIGINE 100 MG PO TABS
200.0000 mg | ORAL_TABLET | Freq: Every day | ORAL | Status: DC
  Administered 2023-06-30 – 2023-07-06 (×7): 200 mg via ORAL
  Filled 2023-06-30 (×7): qty 2

## 2023-06-30 MED ORDER — PRAZOSIN HCL 2 MG PO CAPS: 2.0000 mg | ORAL_CAPSULE | Freq: Every day | ORAL | Status: DC

## 2023-06-30 MED ORDER — SIMETHICONE 80 MG PO CHEW: 40.0000 mg | CHEWABLE_TABLET | Freq: Four times a day (QID) | ORAL | Status: DC | PRN

## 2023-06-30 NOTE — ED Provider Notes (Signed)
  Physical Exam  BP (!) 141/84   Pulse (!) 115   Temp 98.7 F (37.1 C)   Resp 11   Wt 54.4 kg   SpO2 99%   BMI 18.25 kg/m   Physical Exam Vitals and nursing note reviewed.     Procedures  Procedures  ED Course / MDM   Clinical Course as of 06/30/23 1630  Tue Jun 30, 2023  1501 Spoke with general surgery Marisue Ivan, she will inform Dr. Rolene Course that the patient is in the emergency department and have Dr. [name] assess the patient [AS]    Clinical Course User Index [AS] Schutt, Edsel Petrin, PA-C   Medical Decision Making Amount and/or Complexity of Data Reviewed Labs: ordered. Radiology: ordered.  Risk Prescription drug management. Decision regarding hospitalization.   Patient care assumed from Robbie Louis. PA at shift change, please see a note for his full HPI. Briefly, patient here status post distal gastrectomy due to duodenal ulcer this was done by Dr. [name], here today some electrolyte derangements with a potassium of 3.0, creatinine is within normal limits.  BUN slightly elevated, concern for dehydration as patient does appear very thin and has had no oral intake over the last several days.  No leukocytosis.  Plan is for patient to be evaluated by Dr. [name] of surgery and determine disposition at this time.  He is also pending CT abdomen.  4:30 PM Patient has been admitted by general surgery for further management.    Portions of this note were generated with Scientist, clinical (histocompatibility and immunogenetics). Dictation errors may occur despite best attempts at proofreading.         Claude Manges, PA-C 06/30/23 1630    Rondel Baton, MD 07/01/23 1414

## 2023-06-30 NOTE — Progress Notes (Signed)
Pt transported to CT via bed by transportation staff 

## 2023-06-30 NOTE — ED Notes (Signed)
ED TO INPATIENT HANDOFF REPORT  ED Nurse Name and Phone #: Al Corpus, RN [phone]  S Name/Age/Gender Alan Jennings 42 y.o. male Room/Bed: 019C/019C  Code Status   Code Status: Full Code  Home/SNF/Other Home Patient oriented to: self, place, time, and situation Is this baseline? Yes   Triage Complete: Triage complete  Chief Complaint Delayed gastric emptying [K30]  Triage Note Pt sent by GI surgeon; G tube placed 7/3, released on 7/8; since getting home, pt states he has lost 10-15 lbs, lost voice, uncomfortable after after eating; denies fevers, denies vomiting; c/o pain to L side at surgical site; states tube keeps getting clogged   Allergies Allergies  Allergen Reactions   Tramadol Other (See Comments)    SEIZURES   Nsaids Other (See Comments)    Has caused ulcers and needs to avoid    Level of Care/Admitting Diagnosis ED Disposition     ED Disposition  Admit   Condition  --   Comment  Hospital [facility]: [facility] [[id number]]  Level of Care: Med-Surg [16]  May admit patient to [facility] or [facility] if equivalent level of care is available:: No  Covid Evaluation: Asymptomatic - no recent exposure (last 10 days) testing not required  Diagnosis: Delayed gastric emptying [147829]  Admitting Physician: Almond Lint [[id number]]  Attending Physician: Almond Lint [[id number]]  Bed request comments: [id number]  Certification:: I certify this patient will need inpatient services for at least 2 midnights  Estimated Length of Stay: 5          B Medical/Surgery History Past Medical History:  Diagnosis Date   Abdominal pain, chronic, epigastric    Anxiety    Arthritis    Back pain    Complication of anesthesia    had a syncopal episode after rhinoplasty   Depression    GERD (gastroesophageal reflux disease)    GI bleed    Headache    History of kidney stones    Hypertension    Neurogenic pain    Peptic ulcer disease    PTSD (post-traumatic stress  disorder)    Seizures (HCC)    Seizures (HCC)    Past Surgical History:  Procedure Laterality Date   BIOPSY  06/25/2018   Procedure: BIOPSY;  Surgeon: Malissa Hippo, MD;  Location: [facility];  Service: Endoscopy;;  gastric   BIOPSY  05/21/2023   Procedure: BIOPSY;  Surgeon: Lynann Bologna, DO;  Location: [facility];  Service: Gastroenterology;;   ESOPHAGOGASTRODUODENOSCOPY N/A 05/21/2023   Procedure: ESOPHAGOGASTRODUODENOSCOPY (EGD);  Surgeon: Lynann Bologna, DO;  Location: [facility];  Service: Gastroenterology;  Laterality: N/A;   ESOPHAGOGASTRODUODENOSCOPY (EGD) WITH PROPOFOL N/A 06/25/2018   Procedure: ESOPHAGOGASTRODUODENOSCOPY (EGD) WITH PROPOFOL;  Surgeon: Malissa Hippo, MD;  [facility]: [facility];  Service: Endoscopy;  Laterality: N/A;   ESOPHAGOGASTRODUODENOSCOPY (EGD) WITH PROPOFOL N/A 05/13/2022   Procedure: ESOPHAGOGASTRODUODENOSCOPY (EGD) WITH PROPOFOL;  Surgeon: Dolores Frame, MD;  [facility];  Service: Gastroenterology;  Laterality: N/A;   GASTROSTOMY  06/17/2023   Procedure: INSERTION OF GASTROSTOMY TUBE;  Surgeon: Almond Lint, MD;  Location: [facility];  Service: General;;   NOSE SURGERY     [facility]   ORIF HUMERUS FRACTURE  04/26/2012   Procedure: OPEN REDUCTION INTERNAL FIXATION (ORIF) PROXIMAL HUMERUS FRACTURE;  Surgeon: Vickki Hearing, MD;  Location: [facility];  Service: Orthopedics;  Laterality: Left;  Open Reduction Internal Fixation of Left Proximal Humerus Fracture, Closing Wedge Osteotomy, Bone Graft  RHINOPLASTY  2003   [facility]   SHOULDER SURGERY Left 2013     A IV Location/Drains/Wounds Patient Lines/Drains/Airways Status     Active Line/Drains/Airways     Name Placement date Placement time Site Days   Peripheral IV 06/30/23 20 G Anterior;Left;Proximal Forearm 06/30/23  1407  Forearm  less than 1   Peripheral IV 06/30/23 18 G 1" Right Antecubital 06/30/23  1540  Antecubital  less than 1   Gastrostomy/Enterostomy  Gastrostomy 20 Fr. LUQ 06/17/23  1738  LUQ  13   Incision - 4 Ports Abdomen 1: Lateral;Upper;Left 2: Left;Upper;Medial 3: Right;Upper;Medial 4: Right;Upper;Lateral 06/17/23  1445  -- 13            Intake/Output Last 24 hours No intake or output data in the 24 hours ending 06/30/23 1619  Labs/Imaging Results for orders placed or performed during the hospital encounter of 06/30/23 (from the past 48 hour(s))  Comprehensive metabolic panel     Status: Abnormal   Collection Time: 06/30/23  1:59 PM  Result Value Ref Range   Sodium 130 (L) 135 - 145 mmol/L   Potassium 3.0 (L) 3.5 - 5.1 mmol/L   Chloride 79 (L) 98 - 111 mmol/L   CO2 30 22 - 32 mmol/L   Glucose, Bld 103 (H) 70 - 99 mg/dL    Comment: Glucose reference range applies only to samples taken after fasting for at least 8 hours.   BUN 27 (H) 6 - 20 mg/dL   Creatinine, Ser 4.54 0.61 - 1.24 mg/dL   Calcium 9.2 8.9 - 09.8 mg/dL   Total Protein 8.8 (H) 6.5 - 8.1 g/dL   Albumin 3.7 3.5 - 5.0 g/dL   AST 21 15 - 41 U/L   ALT 18 0 - 44 U/L   Alkaline Phosphatase 99 38 - 126 U/L   Total Bilirubin 0.6 0.3 - 1.2 mg/dL   GFR, Estimated >11 >91 mL/min    Comment: (NOTE) Calculated using the CKD-EPI Creatinine Equation (2021)    Anion gap 21 (H) 5 - 15    Comment: ELECTROLYTES REPEATED TO VERIFY Performed at [facility], [address] [place], [place] [address]   CBC with Differential     Status: Abnormal   Collection Time: 06/30/23  1:59 PM  Result Value Ref Range   WBC 9.9 4.0 - 10.5 K/uL   RBC 4.09 (L) 4.22 - 5.81 MIL/uL   Hemoglobin 9.2 (L) 13.0 - 17.0 g/dL   HCT 56.2 (L) 13.0 - 86.5 %   MCV 74.8 (L) 80.0 - 100.0 fL   MCH 22.5 (L) 26.0 - 34.0 pg   MCHC 30.1 30.0 - 36.0 g/dL   RDW 78.4 (H) 69.6 - 29.5 %   Platelets 1,270 (HH) 150 - 400 K/uL    Comment: REPEATED TO VERIFY THIS CRITICAL RESULT HAS VERIFIED AND BEEN CALLED TO JON Dane Bloch,RN BY ZELDA BEECH ON 07 16 2024 AT 1434, AND HAS BEEN READ BACK.     nRBC 0.0 0.0 -  0.2 %   Neutrophils Relative % 79 %   Neutro Abs 7.7 1.7 - 7.7 K/uL   Lymphocytes Relative 16 %   Lymphs Abs 1.6 0.7 - 4.0 K/uL   Monocytes Relative 5 %   Monocytes Absolute 0.5 0.1 - 1.0 K/uL   Eosinophils Relative 0 %   Eosinophils Absolute 0.0 0.0 - 0.5 K/uL   Basophils Relative 0 %   Basophils Absolute 0.0 0.0 - 0.1 K/uL   Immature Granulocytes 0 %  Abs Immature Granulocytes 0.04 0.00 - 0.07 K/uL    Comment: Performed at [facility], [address] [place], [place] [address]  Protime-INR     Status: None   Collection Time: 06/30/23  1:59 PM  Result Value Ref Range   Prothrombin Time 13.7 11.4 - 15.2 seconds   INR 1.0 0.8 - 1.2    Comment: (NOTE) INR goal varies based on device and disease states. Performed at [facility], [address] [place], [place] [address]   I-Stat Lactic Acid, ED     Status: None   Collection Time: 06/30/23  2:22 PM  Result Value Ref Range   Lactic Acid, Venous 1.6 0.5 - 1.9 mmol/L  I-Stat Lactic Acid, ED     Status: None   Collection Time: 06/30/23  3:45 PM  Result Value Ref Range   Lactic Acid, Venous 1.0 0.5 - 1.9 mmol/L   DG Chest 2 View  Result Date: 06/30/2023 CLINICAL DATA:  Suspected Sepsis EXAM: CHEST - 2 VIEW COMPARISON:  CXR 06/19/23 FINDINGS: No pleural effusion. No pneumothorax. No focal airspace opacity. Normal cardiac and mediastinal contours. No radiographically apparent displaced rib. Visualized upper abdomen is unremarkable. Gastrostomy tube in the left upper quadrant. Vertebral body heights are maintained. IMPRESSION: No focal airspace opacity. Electronically Signed   By: Lorenza Cambridge M.D.   On: 06/30/2023 15:38    Pending Labs Unresulted Labs (From admission, onward)     Start     Ordered   06/30/23 1434  Lipase, blood  Once,   STAT        06/30/23 1433   06/30/23 1359  Culture, blood (Routine x 2)  BLOOD CULTURE X 2,   R (with STAT occurrences)      06/30/23 1358   06/30/23 1359  Urinalysis, w/ Reflex to  Culture (Infection Suspected) -Urine, Clean Catch  Once,   URGENT       Question:  Specimen Source  Answer:  Urine, Clean Catch   06/30/23 1358   Signed and Held  CBC  (enoxaparin (LOVENOX)    CrCl >/= 30 ml/min)  Once,   R       Comments: Baseline for enoxaparin therapy IF NOT ALREADY DRAWN.  Notify MD if PLT < 100 K.    Signed and Held   Signed and Held  Creatinine, serum  (enoxaparin (LOVENOX)    CrCl >/= 30 ml/min)  Once,   R       Comments: Baseline for enoxaparin therapy IF NOT ALREADY DRAWN.    Signed and Held   Signed and Held  Creatinine, serum  (enoxaparin (LOVENOX)    CrCl >/= 30 ml/min)  Weekly,   R     Comments: while on enoxaparin therapy    Signed and Held   Signed and Held  Basic metabolic panel  Daily,   R      Signed and Held   Signed and Held  Magnesium  Tomorrow morning,   R        Signed and Held   Signed and Held  Phosphorus  Tomorrow morning,   R        Signed and Held   Signed and Held  CBC  Tomorrow morning,   R        Signed and Held            Vitals/Pain Today's Vitals   06/30/23 1440 06/30/23 1445 06/30/23 1515 06/30/23 1541  BP:  Pulse:      Resp: 17 15 12    Temp:      SpO2:      Weight:      PainSc:    7     Isolation Precautions No active isolations  Medications Medications  sodium chloride 0.9 % bolus 1,000 mL (has no administration in time range)  sodium chloride 0.9 % bolus 1,000 mL (1,000 mLs Intravenous New Bag/Given 06/30/23 1545)  HYDROmorphone (DILAUDID) injection 1 mg (1 mg Intravenous Given 06/30/23 1546)    Mobility walks     Focused Assessments Cardiac Assessment Handoff:    Lab Results  Component Value Date   TROPONINI <0.03 12/27/2018   Lab Results  Component Value Date   DDIMER <0.22 02/18/2012   Does the Patient currently have chest pain? No    R Recommendations: See Admitting Provider Note  Report given to:   Additional Notes: Sx since G-tube, primarily belching bile and L sided abd pain,  reports losing weight, A&O

## 2023-06-30 NOTE — ED Notes (Signed)
 Pt not in room, pt in xray.  

## 2023-06-30 NOTE — ED Notes (Signed)
Surgeon at [facility] 

## 2023-06-30 NOTE — Progress Notes (Signed)
Pt arrived to 6 north room 7 alert and oriented x4. Patient walked from stretcher to bed independently with no issues. Pain level 5/10. Bed in lowest position. Call light in reach. Will continue to monitor pt.

## 2023-06-30 NOTE — Progress Notes (Signed)
PHARMACY - TOTAL PARENTERAL NUTRITION CONSULT NOTE   Indication:  severe protein calorie malnutrition and delayed gastric emptying  Patient Measurements: Weight: 54.4 kg (120 lb)   Body mass index is 18.25 kg/m. 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 Patient was started on CLD 7/5 and tolerating soft diet 7/9. Patient had BM 7/11 and was discharged. At home, patient had increasing abdominal pain and distention with poor PO intake. Patient had 15 lb weight loss in 2.5 months. Gtube to gravity with frequent clogging per patient.  Pharmacy consulted for TPN.  Glucose / Insulin:  Electrolytes:  Renal:  Hepatic:  Intake / Output; MIVF:  GI Imaging: 7/16 CT pending GI Surgeries / Procedures:  7/3 distal gastrectomy and G-tube placement 7/3. Patient was started on CLD 7/5 and tolerating soft diet 7/9. Patient had BM 7/11 and was discharged.  Central access: PICC ordered 7/16 TPN start date: 7/17  Nutritional Goals: Goal TPN rate is pending   RD Assessment: pending    Current Nutrition:  NPO  Plan:  Consult received after hours, will plan to start 7/17  Alphia Moh, PharmD, BCPS, [facility] Clinical Pharmacist  Please check AMION for all [facility] phone numbers After 10:00 PM, call Main Pharmacy [phone]

## 2023-06-30 NOTE — ED Triage Notes (Signed)
Pt sent by GI surgeon; G tube placed 7/3, released on 7/8; since getting home, pt states he has lost 10-15 lbs, lost voice, uncomfortable after after eating; denies fevers, denies vomiting; c/o pain to L side at surgical site; states tube keeps getting clogged

## 2023-06-30 NOTE — Progress Notes (Signed)
   06/30/23 1710  Assess: MEWS Score  Temp 98.3 F (36.8 C)  BP 133/88  MAP (mmHg) 101  Pulse Rate (!) 117  Resp 17  Level of Consciousness Alert  SpO2 100 %  O2 Device Room Air  Assess: MEWS Score  MEWS Temp 0  MEWS Systolic 0  MEWS Pulse 2  MEWS RR 0  MEWS LOC 0  MEWS Score 2  MEWS Score Color Yellow  Assess: if the MEWS score is Yellow or Red  Were vital signs taken at a resting state? Yes  Focused Assessment Change from prior assessment (see assessment flowsheet)  Does the patient meet 2 or more of the SIRS criteria? No  MEWS guidelines implemented  Yes, yellow  Treat  MEWS Interventions Considered administering scheduled or prn medications/treatments as ordered  Take Vital Signs  Increase Vital Sign Frequency  Yellow: Q2hr x1, continue Q4hrs until patient remains green for 12hrs  Escalate  MEWS: Escalate Yellow: Discuss with charge nurse and consider notifying provider and/or RRT  Notify: Charge Nurse/RN  Name of Charge Nurse/RN Notified Miriam,RN  Provider Notification  Provider Name/Title Byerly,MD  Date Provider Notified 06/30/23  Time Provider Notified 1836  Method of Notification Page  Notification Reason Other (Comment) (Yellow MEWS)  Provider response No new orders  Date of Provider Response 06/30/23  Assess: SIRS CRITERIA  SIRS Temperature  0  SIRS Pulse 1  SIRS Respirations  0  SIRS WBC 0  SIRS Score Sum  1

## 2023-06-30 NOTE — ED Provider Notes (Signed)
[facility] EMERGENCY DEPARTMENT AT [facility] Provider Note   CSN: [id number] Arrival date & time: 06/30/23  1300     History  Chief Complaint  Patient presents with   Post-op Problem    Alan Jennings is a 42 y.o. male.  With a history of anxiety, depression, seizure disorder, PTSD, GERD, peptic ulcer disease who presents to the ED for evaluation of abdominal pain, fatigue, anorexia.  He is status post distal gastrectomy on 06/17/2023 with Dr. [name].  He states he was doing well when he left the hospital but has not been doing well since he left.  He states he is hardly able to eat anything at home.  He is able to drink without difficulty.  He has persistent and constant left-sided abdominal pain.  This was present prior to the surgery.  He reports occasional nausea but denies any vomiting.  No diarrhea.  He is having bowel movements every other day.  He denies melena or hematochezia.  He denies any fevers or chills.  No chest pain.  States he has been short of breath since he left the hospital which has progressively improved.  No cough.  He denies any difficulty urinating.  States he called his surgeon's office and was told to come to the emergency department for further evaluation.  He states he has lost 10 pounds since the surgery.  HPI     Home Medications Prior to Admission medications   Medication Sig Start Date End Date Taking? Authorizing Provider  acetaminophen (TYLENOL) 500 MG tablet Take 500-1,000 mg by mouth every 6 (six) hours as needed (for headaches).    [provider]  buprenorphine (SUBUTEX) 8 MG SUBL SL tablet Dissolve 1 tablet in mouth 3 times daily 06/09/23     busPIRone (BUSPAR) 30 MG tablet Take 1 tablet (30 mg) by mouth 2 times daily with a meal. 06/09/23     calcium carbonate (TUMS - DOSED IN MG ELEMENTAL CALCIUM) 500 MG chewable tablet Chew 1 tablet by mouth daily as needed for indigestion or heartburn.    [provider]  EMETROL 230  MG CHEW Chew 460 mg by mouth 2 (two) times daily as needed (for nausea).    [provider]  HYDROmorphone (DILAUDID) 2 MG tablet Take 1-2 tablets (2-4 mg total) by mouth every 4 (four) hours as needed for moderate pain or severe pain. 06/25/23   Almond Lint, MD  lamoTRIgine (LAMICTAL) 200 MG tablet Take 1 tablet (200 mg total) by mouth daily. 06/09/23     LORazepam (ATIVAN) 0.5 MG tablet Take 1 tablet (0.5 mg total) by mouth daily as needed for severe anxiety (must last 30 days per md) 04/18/21     pantoprazole (PROTONIX) 40 MG tablet Take 1 tablet (40 mg total) by mouth daily. 05/22/23   Lewie Chamber, MD  prazosin (MINIPRESS) 2 MG capsule Take 1 capsule (2 mg) by mouth at bedtime. 06/09/23     promethazine (PHENERGAN) 12.5 MG tablet Take 1 tablet (12.5 mg total) by mouth every 6 (six) hours as needed for nausea or vomiting. 06/25/23   Almond Lint, MD  QUEtiapine (SEROQUEL) 25 MG tablet Take 1 tablet (25 mg) by mouth daily as needed for insomnia 06/09/23     sucralfate (CARAFATE) 1 g tablet Take 1 tablet (1 g total) by mouth 4 (four) times daily. 05/22/23 05/21/24  Lewie Chamber, MD      Allergies    Tramadol and Nsaids    Review of  Systems   Review of Systems  Gastrointestinal:  Positive for abdominal distention and abdominal pain.  All other systems reviewed and are negative.   Physical Exam Updated Vital Signs BP 122/77   Pulse (!) 136   Temp 98.7 F (37.1 C)   Resp 17   Wt 54.4 kg   SpO2 100%   BMI 18.25 kg/m  Physical Exam Vitals and nursing note reviewed.  Constitutional:      General: He is not in acute distress.    Appearance: He is well-developed.     Comments: Emaciated  HENT:     Head: Normocephalic and atraumatic.  Eyes:     Conjunctiva/sclera: Conjunctivae normal.  Cardiovascular:     Rate and Rhythm: Normal rate and regular rhythm.     Heart sounds: No murmur heard. Pulmonary:     Effort: Pulmonary effort is normal. No respiratory distress.     Breath  sounds: Normal breath sounds.  Abdominal:     General: Bowel sounds are normal. There is distension.     Tenderness: There is abdominal tenderness (Left-sided). There is guarding.     Comments: Left upper quadrant drain in place.  No surrounding erythema.  No appreciated purulent drainage  Musculoskeletal:        General: No swelling.     Cervical back: Neck supple.  Skin:    General: Skin is warm and dry.     Capillary Refill: Capillary refill takes less than 2 seconds.     Coloration: Skin is not jaundiced.  Neurological:     General: No focal deficit present.     Mental Status: He is alert and oriented to person, place, and time.  Psychiatric:        Mood and Affect: Mood normal.     ED Results / Procedures / Treatments   Labs (all labs ordered are listed, but only abnormal results are displayed) Labs Reviewed  COMPREHENSIVE METABOLIC PANEL - Abnormal; Notable for the following components:      Result Value   Sodium 130 (*)    Potassium 3.0 (*)    Chloride 79 (*)    Glucose, Bld 103 (*)    BUN 27 (*)    Total Protein 8.8 (*)    Anion gap 21 (*)    All other components within normal limits  CBC WITH DIFFERENTIAL/PLATELET - Abnormal; Notable for the following components:   RBC 4.09 (*)    Hemoglobin 9.2 (*)    HCT 30.6 (*)    MCV 74.8 (*)    MCH 22.5 (*)    RDW 18.2 (*)    Platelets 1,270 (*)    All other components within normal limits  CULTURE, BLOOD (ROUTINE X 2)  CULTURE, BLOOD (ROUTINE X 2)  PROTIME-INR  URINALYSIS, W/ REFLEX TO CULTURE (INFECTION SUSPECTED)  LIPASE, BLOOD  I-STAT CG4 LACTIC ACID, ED    EKG None  Radiology No results found.  Procedures Procedures    Medications Ordered in ED Medications  sodium chloride 0.9 % bolus 1,000 mL (has no administration in time range)  HYDROmorphone (DILAUDID) injection 1 mg (has no administration in time range)    ED Course/ Medical Decision Making/ A&P Clinical Course as of 06/30/23 1520  Tue Jun 30, 2023  1501 Spoke with general surgery Marisue Ivan, she will inform Dr. Rolene Course that the patient is in the emergency department and have Dr. [name] assess the patient [AS]    Clinical Course User Index [AS] Michelle Piper,  PA-C                             Medical Decision Making Amount and/or Complexity of Data Reviewed Labs: ordered. Radiology: ordered.  This patient presents to the ED for concern of post operative abdominal pain, anorexia, this involves an extensive number of treatment options, and is a complaint that carries with it a high risk of complications and morbidity. The differential diagnosis for generalized abdominal pain includes, but is not limited to AAA, gastroenteritis, appendicitis, Bowel obstruction, Bowel perforation. Gastroparesis, DKA, Hernia, Inflammatory bowel disease, mesenteric ischemia, pancreatitis, peritonitis SBP, volvulus.   Co morbidities that complicate the patient evaluation   anxiety, depression, seizure disorder, PTSD, GERD, peptic ulcer disease  My initial workup includes labs, imaging, EKG, fluids  Additional history obtained from: Nursing notes from this visit. Previous records within EMR system admission on 06/17/2023 for duodenal stricture, distal gastrectomy surgery  I ordered, reviewed and interpreted labs which include: CBC, CMP, lactate, INR, blood cultures, urinalysis.  Labs pending at the time to change  I ordered imaging studies including chest x-ray, CT abdomen pelvis I independently visualized and interpreted imaging which showed pending at the time shift change. I agree with the radiologist interpretation.   Cardiac Monitoring:  The patient was maintained on a cardiac monitor.  I personally viewed and interpreted the cardiac monitored which showed an underlying rhythm of: Sinus tachycardia  Consultations Obtained:  I requested consultation with general surgery,  and discussed lab and imaging findings as well as pertinent plan -  they recommend: Obtain CT abdomen pelvis, 1 L of fluids, Dr. [name] will assess and give recommendations.  Afebrile, tachycardic but otherwise hemodynamically stable.  42 year old male presenting to the ED for evaluation of postoperative pain as described in the HPI.  Patient is very thin on exam consistent with his described 10 pound weight loss.  He has significant left-sided abdominal tenderness to palpation.  He has a left upper quadrant drain in place with no surrounding evidence of infection.  He also has a previous drain site in the right lower quadrant which is still open, but patient states there is no drainage.  Labs and imaging pending at shift change.  Recommendation for final disposition pending general surgery evaluation. I suspect patient will need admission.  Ultimately decision making will depend on full workup and general surgery assessment.  Care will be handed off to oncoming provider.  Please see her note for final disposition and decision making.  Patient's case discussed with Dr. Jeraldine Loots.   Note: Portions of this report may have been transcribed using voice recognition software. Every effort was made to ensure accuracy; however, inadvertent computerized transcription errors may still be present.        Final Clinical Impression(s) / ED Diagnoses Final diagnoses:  Post-operative pain  Thrombocytosis    Rx / DC Orders ED Discharge Orders     None         Mora Bellman 06/30/23 1520    Gerhard Munch, MD 06/30/23 1551

## 2023-06-30 NOTE — H&P (Signed)
Alan Jennings is an 42 y.o. male.   Chief Complaint: abdominal pain and weight loss.   HPI:  Patient presents to the emergency department after calling our office with complaints of weight loss, abdominal distention, difficulty eating, and difficult urination.  He is approximately 2-week status post robotic distal gastrectomy and G-tube placement.  He went home 5 days ago with reasonable p.o. intake.  He had also had a bowel movement.  He had reasonable pain control.  He denied nausea and vomiting.  Apparently since being home he has had increasing difficulty eating along with significant increase in abdominal pain and distention.  Incidentally, he had urinary retention while in house and this has become an issue while at home.  His urine is also very dark.  He has been noted to have lost 10 pounds since preop.    He has been opening the g tube to gravity and it frequently gets clogged.  He has been trying to take ensure and pedialyte.     Past Medical History:  Diagnosis Date   Abdominal pain, chronic, epigastric    Anxiety    Arthritis    Back pain    Complication of anesthesia    had a syncopal episode after rhinoplasty   Depression    GERD (gastroesophageal reflux disease)    GI bleed    Headache    History of kidney stones    Hypertension    Neurogenic pain    Peptic ulcer disease    PTSD (post-traumatic stress disorder)    Seizures (HCC)    Seizures (HCC)     Past Surgical History:  Procedure Laterality Date   BIOPSY  06/25/2018   Procedure: BIOPSY;  Surgeon: Malissa Hippo, MD;  [facility];  Service: Endoscopy;;  gastric   BIOPSY  05/21/2023   Procedure: BIOPSY;  Surgeon: Lynann Bologna, DO;  Location: [facility];  Service: Gastroenterology;;   ESOPHAGOGASTRODUODENOSCOPY N/A 05/21/2023   Procedure: ESOPHAGOGASTRODUODENOSCOPY (EGD);  Surgeon: Lynann Bologna, DO;  Location: [facility];  Service: Gastroenterology;  Laterality: N/A;    ESOPHAGOGASTRODUODENOSCOPY (EGD) WITH PROPOFOL N/A 06/25/2018   Procedure: ESOPHAGOGASTRODUODENOSCOPY (EGD) WITH PROPOFOL;  Surgeon: Malissa Hippo, MD;  [facility];  Service: Endoscopy;  Laterality: N/A;   ESOPHAGOGASTRODUODENOSCOPY (EGD) WITH PROPOFOL N/A 05/13/2022   Procedure: ESOPHAGOGASTRODUODENOSCOPY (EGD) WITH PROPOFOL;  Surgeon: Dolores Frame, MD;  [facility];  Service: Gastroenterology;  Laterality: N/A;   GASTROSTOMY  06/17/2023   Procedure: INSERTION OF GASTROSTOMY TUBE;  Surgeon: Almond Lint, MD;  Location: [facility];  Service: General;;   NOSE SURGERY     [facility]   ORIF HUMERUS FRACTURE  04/26/2012   Procedure: OPEN REDUCTION INTERNAL FIXATION (ORIF) PROXIMAL HUMERUS FRACTURE;  Surgeon: Vickki Hearing, MD;  Location: [facility];  Service: Orthopedics;  Laterality: Left;  Open Reduction Internal Fixation of Left Proximal Humerus Fracture, Closing Wedge Osteotomy, Bone Graft   [facility]Y  2003   [facility]   SHOULDER SURGERY Left 2013    Family History  Problem Relation Age of Onset   Heart disease Other    Arthritis Other    Anesthesia problems Neg Hx    Hypotension Neg Hx    Malignant hyperthermia Neg Hx    Pseudochol deficiency Neg Hx    Social History:  reports that he quit smoking about 5 years ago. His smoking use included e-cigarettes and cigarettes. He has never used smokeless tobacco. He reports that he does not  currently use drugs after having used the following drugs: Marijuana. He reports that he does not drink alcohol.  Allergies:  Allergies  Allergen Reactions   Tramadol Other (See Comments)    SEIZURES   Nsaids Other (See Comments)    Has caused ulcers and needs to avoid    Meds:   acetaminophen (TYLENOL) 500 MG tablet buprenorphine (SUBUTEX) 8 MG SUBL SL tablet busPIRone (BUSPAR) 30 MG tablet calcium carbonate (TUMS - DOSED IN MG ELEMENTAL CALCIUM) 500 MG chewable tablet EMETROL 230 MG CHEW HYDROmorphone (DILAUDID) 2 MG  tablet lamoTRIgine (LAMICTAL) 200 MG tablet LORazepam (ATIVAN) 0.5 MG tablet pantoprazole (PROTONIX) 40 MG tablet prazosin (MINIPRESS) 2 MG capsule promethazine (PHENERGAN) 12.5 MG tablet QUEtiapine (SEROQUEL) 25 MG tablet sucralfate (CARAFATE) 1 g tablet  Results for orders placed or performed during the hospital encounter of 06/30/23 (from the past 48 hour(s))  Comprehensive metabolic panel     Status: Abnormal   Collection Time: 06/30/23  1:59 PM  Result Value Ref Range   Sodium 130 (L) 135 - 145 mmol/L   Potassium 3.0 (L) 3.5 - 5.1 mmol/L   Chloride 79 (L) 98 - 111 mmol/L   CO2 30 22 - 32 mmol/L   Glucose, Bld 103 (H) 70 - 99 mg/dL    Comment: Glucose reference range applies only to samples taken after fasting for at least 8 hours.   BUN 27 (H) 6 - 20 mg/dL   Creatinine, Ser 1.61 0.61 - 1.24 mg/dL   Calcium 9.2 8.9 - 09.6 mg/dL   Total Protein 8.8 (H) 6.5 - 8.1 g/dL   Albumin 3.7 3.5 - 5.0 g/dL   AST 21 15 - 41 U/L   ALT 18 0 - 44 U/L   Alkaline Phosphatase 99 38 - 126 U/L   Total Bilirubin 0.6 0.3 - 1.2 mg/dL   GFR, Estimated >04 >54 mL/min    Comment: (NOTE) Calculated using the CKD-EPI Creatinine Equation (2021)    Anion gap 21 (H) 5 - 15    Comment: ELECTROLYTES REPEATED TO VERIFY Performed at [facility], [address], [place], [place] [address]   CBC with Differential     Status: Abnormal   Collection Time: 06/30/23  1:59 PM  Result Value Ref Range   WBC 9.9 4.0 - 10.5 K/uL   RBC 4.09 (L) 4.22 - 5.81 MIL/uL   Hemoglobin 9.2 (L) 13.0 - 17.0 g/dL   HCT 91.4 (L) 78.2 - 95.6 %   MCV 74.8 (L) 80.0 - 100.0 fL   MCH 22.5 (L) 26.0 - 34.0 pg   MCHC 30.1 30.0 - 36.0 g/dL   RDW 21.3 (H) 08.6 - 57.8 %   Platelets 1,270 (HH) 150 - 400 K/uL    Comment: REPEATED TO VERIFY THIS CRITICAL RESULT HAS VERIFIED AND BEEN CALLED TO JON COOK,RN BY ZELDA BEECH ON 07 16 2024 AT 1434, AND HAS BEEN READ BACK.     nRBC 0.0 0.0 - 0.2 %   Neutrophils Relative % 79 %   Neutro  Abs 7.7 1.7 - 7.7 K/uL   Lymphocytes Relative 16 %   Lymphs Abs 1.6 0.7 - 4.0 K/uL   Monocytes Relative 5 %   Monocytes Absolute 0.5 0.1 - 1.0 K/uL   Eosinophils Relative 0 %   Eosinophils Absolute 0.0 0.0 - 0.5 K/uL   Basophils Relative 0 %   Basophils Absolute 0.0 0.0 - 0.1 K/uL   Immature Granulocytes 0 %   Abs Immature Granulocytes 0.04 0.00 -  0.07 K/uL    Comment: Performed at [facility], [address] [place], [place] [address]  Protime-INR     Status: None   Collection Time: 06/30/23  1:59 PM  Result Value Ref Range   Prothrombin Time 13.7 11.4 - 15.2 seconds   INR 1.0 0.8 - 1.2    Comment: (NOTE) INR goal varies based on device and disease states. Performed at [facility], [address] [place], [place] [address]   I-Stat Lactic Acid, ED     Status: None   Collection Time: 06/30/23  2:22 PM  Result Value Ref Range   Lactic Acid, Venous 1.6 0.5 - 1.9 mmol/L   No results found.  Review of Systems  Constitutional:  Positive for appetite change, fatigue and unexpected weight change.  Respiratory: Negative.    Gastrointestinal:  Positive for abdominal distention, abdominal pain, constipation and nausea.  Genitourinary:  Positive for difficulty urinating.  Musculoskeletal: Negative.   Skin:  Positive for pallor.  Allergic/Immunologic: Negative.   Neurological:  Positive for light-headedness.  Hematological: Negative.   Psychiatric/Behavioral: Negative.      Blood pressure 122/77, pulse (!) 136, temperature 98.7 F (37.1 C), resp. rate 17, weight 54.4 kg, SpO2 100%. Physical Exam Vitals reviewed.  Constitutional:      General: He is in acute distress.     Appearance: Normal appearance.  HENT:     Head: Normocephalic and atraumatic.     Right Ear: External ear normal.     Left Ear: External ear normal.     Mouth/Throat:     Mouth: Mucous membranes are dry.  Eyes:     General: No scleral icterus.    Extraocular Movements: Extraocular  movements intact.     Pupils: Pupils are equal, round, and reactive to light.  Cardiovascular:     Rate and Rhythm: Regular rhythm. Tachycardia present.  Pulmonary:     Effort: Pulmonary effort is normal.  Abdominal:     General: There is distension.     Palpations: Abdomen is soft.     Comments: G tube in place  Musculoskeletal:        General: No swelling or tenderness.     Cervical back: Neck supple.  Skin:    General: Skin is warm and dry.     Capillary Refill: Capillary refill takes 2 to 3 seconds.     Coloration: Skin is pale.  Neurological:     Mental Status: He is alert and oriented to person, place, and time.  Psychiatric:        Mood and Affect: Mood normal.      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    Delayed gastric emptying/gastroparesis from chronic obstruction Dehydration Severe protein calorie malnutrition Acute kidney injury Urinary retention Hypochloremia and hyponatremia ABL anemia with chronic anemia of chronic disease Thrombocytosis  Hydrate Replete na and chloride Check bladder scan/post void residuals G tube to gravity TNA for severe protein calorie malnutrition and delayed gastric emptying.    Almond Lint, MD 06/30/2023, 3:16 PM

## 2023-07-01 DIAGNOSIS — E43 Unspecified severe protein-calorie malnutrition: Secondary | ICD-10-CM | POA: Insufficient documentation

## 2023-07-01 LAB — MAGNESIUM: Magnesium: 2.2 mg/dL (ref 1.7–2.4)

## 2023-07-01 LAB — CBC
HCT: 25 % — ABNORMAL LOW (ref 39.0–52.0)
Hemoglobin: 7.5 g/dL — ABNORMAL LOW (ref 13.0–17.0)
MCH: 22.3 pg — ABNORMAL LOW (ref 26.0–34.0)
MCHC: 30 g/dL (ref 30.0–36.0)
MCV: 74.2 fL — ABNORMAL LOW (ref 80.0–100.0)
Platelets: 1037 10*3/uL (ref 150–400)
RBC: 3.37 MIL/uL — ABNORMAL LOW (ref 4.22–5.81)
RDW: 18.5 % — ABNORMAL HIGH (ref 11.5–15.5)
WBC: 11.5 10*3/uL — ABNORMAL HIGH (ref 4.0–10.5)
nRBC: 0 % (ref 0.0–0.2)

## 2023-07-01 LAB — COMPREHENSIVE METABOLIC PANEL
ALT: 13 U/L (ref 0–44)
AST: 17 U/L (ref 15–41)
Albumin: 2.9 g/dL — ABNORMAL LOW (ref 3.5–5.0)
Alkaline Phosphatase: 75 U/L (ref 38–126)
Anion gap: 17 — ABNORMAL HIGH (ref 5–15)
BUN: 18 mg/dL (ref 6–20)
CO2: 27 mmol/L (ref 22–32)
Calcium: 8.4 mg/dL — ABNORMAL LOW (ref 8.9–10.3)
Chloride: 85 mmol/L — ABNORMAL LOW (ref 98–111)
Creatinine, Ser: 0.8 mg/dL (ref 0.61–1.24)
GFR, Estimated: >60 mL/min (ref >60)
Glucose, Bld: 79 mg/dL (ref 70–99)
Potassium: 3 mmol/L — ABNORMAL LOW (ref 3.5–5.1)
Sodium: 129 mmol/L — ABNORMAL LOW (ref 135–145)
Total Bilirubin: 0.7 mg/dL (ref 0.3–1.2)
Total Protein: 7.1 g/dL (ref 6.5–8.1)

## 2023-07-01 LAB — GLUCOSE, CAPILLARY
Glucose-Capillary: 64 mg/dL — ABNORMAL LOW (ref 70–99)
Glucose-Capillary: 141 mg/dL — ABNORMAL HIGH (ref 70–99)
Glucose-Capillary: 99 mg/dL (ref 70–99)

## 2023-07-01 LAB — POTASSIUM: Potassium: 3.9 mmol/L (ref 3.5–5.1)

## 2023-07-01 LAB — PHOSPHORUS: Phosphorus: 3.2 mg/dL (ref 2.5–4.6)

## 2023-07-01 MED ORDER — POTASSIUM CHLORIDE 10 MEQ/100ML IV SOLN
10.0000 meq | INTRAVENOUS | Status: AC
  Administered 2023-07-01 (×4): 10 meq via INTRAVENOUS
  Filled 2023-07-01 (×3): qty 100

## 2023-07-01 MED ORDER — SODIUM CHLORIDE 0.9% FLUSH
10.0000 mL | INTRAVENOUS | Status: DC | PRN
  Administered 2023-07-06: 10 mL

## 2023-07-01 MED ORDER — POTASSIUM CHLORIDE 10 MEQ/100ML IV SOLN
10.0000 meq | INTRAVENOUS | Status: AC
  Administered 2023-07-01 (×2): 10 meq via INTRAVENOUS

## 2023-07-01 MED ORDER — DEXTROSE 50 % IV SOLN
INTRAVENOUS | Status: AC
  Filled 2023-07-01: qty 50

## 2023-07-01 MED ORDER — INSULIN ASPART 100 UNIT/ML IJ SOLN
0.0000 [IU] | INTRAMUSCULAR | Status: DC
  Administered 2023-07-03: 1 [IU] via SUBCUTANEOUS

## 2023-07-01 MED ORDER — SODIUM CHLORIDE 0.9 % IV SOLN: INTRAVENOUS | Status: AC

## 2023-07-01 MED ORDER — TRAVASOL 10 % IV SOLN
INTRAVENOUS | Status: AC
  Filled 2023-07-01: qty 420

## 2023-07-01 MED ORDER — CHLORHEXIDINE GLUCONATE CLOTH 2 % EX PADS
6.0000 | MEDICATED_PAD | Freq: Every day | CUTANEOUS | Status: DC
  Administered 2023-07-01 – 2023-07-07 (×7): 6 via TOPICAL

## 2023-07-01 MED ORDER — DEXTROSE 50 % IV SOLN
12.5000 g | INTRAVENOUS | Status: AC
  Administered 2023-07-01: 12.5 g via INTRAVENOUS

## 2023-07-01 MED ORDER — BISACODYL 10 MG RE SUPP
10.0000 mg | Freq: Once | RECTAL | Status: AC
  Administered 2023-07-01: 10 mg via RECTAL
  Filled 2023-07-01: qty 1

## 2023-07-01 NOTE — TOC Initial Note (Addendum)
Transition of Care (TOC) - Initial/Assessment Note   Spoke to patient at bedside. From home, lives alone , however his mother lives next door.   Patient's uncle gave him a walker last week in case he needs it.   PCP is Vertis Kelch   Confirmed face sheet information.   Plan to start TPN today . Patient will need home TPN . Discussed with patient . Left Fleet Contras with [facility] a message    Emailed referral to Otelia Santee with [facility] for home TPN, once home orders for TPN determined and written will email. [facility] will provide a HHRN, who will met patient at his home at discharge and connect him to his home TPN and provide education  Patient Details  Name: Alan Jennings MRN: [id number] Date of Birth: 1981/04/09  Transition of Care [facility]) CM/SW Contact:    Kingsley Plan, RN Phone Number: 07/01/2023, 10:37 AM  Clinical Narrative:                   Expected Discharge Plan: Home/Self Care Barriers to Discharge: Continued Medical Work up   Patient Goals and CMS Choice Patient states their goals for this hospitalization and ongoing recovery are:: to return to home     [place] ownership interest in [facility].provided to:: Patient    Expected Discharge Plan and Services   Discharge Planning Services: CM Consult   Living arrangements for the past 2 months: Single Family Home                 DME Arranged: N/A DME Agency: NA       HH Arranged: NA          Prior Living Arrangements/Services Living arrangements for the past 2 months: Single Family Home Lives with:: Self Patient language and need for interpreter reviewed:: Yes Do you feel safe going back to the place where you live?: Yes      Need for Family Participation in Patient Care: Yes (Comment) Care giver support system in place?: Yes (comment) Current home services: DME Criminal Activity/Legal Involvement Pertinent to Current Situation/Hospitalization: No - Comment as needed  Activities of Daily  Living      Permission Sought/Granted   Permission granted to share information with : No              Emotional Assessment Appearance:: Appears stated age Attitude/Demeanor/Rapport: Engaged Affect (typically observed): Accepting Orientation: : Oriented to Self, Oriented to Place, Oriented to  Time, Oriented to Situation Alcohol / Substance Use: Not Applicable Psych Involvement: No (comment)  Admission diagnosis:  Delayed gastric emptying [K30] Post-operative pain [G89.18] Thrombocytosis [D75.839] Patient Active Problem List   Diagnosis Date Noted   Delayed gastric emptying 06/30/2023   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 [place] [address] Phone: [phone] Fax: [phone]     Social Determinants of Health (SDOH) Social History: SDOH Screenings   Food Insecurity: No Food Insecurity (06/17/2023)  Housing: Medium Risk (06/17/2023)  Transportation Needs: No Transportation Needs (06/17/2023)  Utilities: Not At Risk (06/17/2023)  Tobacco Use:  Medium Risk (06/30/2023)   SDOH Interventions:     Readmission Risk Interventions    05/21/2023   11:44 AM  Readmission Risk Prevention Plan  Transportation Screening Complete  PCP or Specialist Appt within 5-7 Days Complete  Home Care Screening Complete  Medication Review (RN CM) Complete

## 2023-07-01 NOTE — Progress Notes (Signed)
Peripherally Inserted Central Catheter Placement  The IV Nurse has discussed with the patient and/or persons authorized to consent for the patient, the purpose of this procedure and the potential benefits and risks involved with this procedure.  The benefits include less needle sticks, lab draws from the catheter, and the patient may be discharged home with the catheter. Risks include, but not limited to, infection, bleeding, blood clot (thrombus formation), and puncture of an artery; nerve damage and irregular heartbeat and possibility to perform a PICC exchange if needed/ordered by physician.  Alternatives to this procedure were also discussed.  Bard Power PICC patient education guide, fact sheet on infection prevention and patient information card has been provided to patient /or left at bedside.    PICC Placement Documentation  PICC Double Lumen 07/01/23 Right Basilic 36 cm 0 cm (Active)  Indication for Insertion or Continuance of Line Administration of hyperosmolar/irritating solutions (i.e. TPN, Vancomycin, etc.) 07/01/23 1537  Exposed Catheter (cm) 0 cm 07/01/23 1537  Site Assessment Clean, Dry, Intact 07/01/23 1537  Lumen #1 Status Flushed;Blood return noted;Saline locked 07/01/23 1537  Lumen #2 Status Flushed;Saline locked;Blood return noted 07/01/23 1537  Dressing Type Transparent;Securing device 07/01/23 1537  Dressing Status Antimicrobial disc in place 07/01/23 1537  Safety Lock Not Applicable 07/01/23 1537  Dressing Change Due 07/08/23 07/01/23 1537       Romie Jumper 07/01/2023, 3:40 PM

## 2023-07-01 NOTE — Progress Notes (Signed)
Subjective/Chief Complaint: Still with some upper abdominal pain.  Less belching and less "bad taste in the back of my throat." G tube wasn't placed to gravity.     Objective: Vital signs in last 24 hours: Temp:  [97.4 F (36.3 C)-98.7 F (37.1 C)] 97.5 F (36.4 C) (07/17 0935) Pulse Rate:  [101-136] 111 (07/17 0935) Resp:  [11-28] 16 (07/17 0935) BP: (122-141)/(77-88) 132/84 (07/17 0935) SpO2:  [97 %-100 %] 97 % (07/17 0935) Weight:  [52 kg-54.4 kg] 52 kg (07/17 0900)    Intake/Output from previous day: 07/16 0701 - 07/17 0700 In: 2218.3 [P.O.:120; I.V.:1098.3; IV Piggyback:1000] Out: -  Intake/Output this shift: No intake/output data recorded.  General appearance: alert, cooperative, and no distress Eyes: sunken eyes improved.  Resp: breathing comfortably GI: soft, distended upper abdomen, but less. G tube in place Extremities: extremities normal, atraumatic, no cyanosis or edema  Lab Results:  Recent Labs    06/30/23 1359 06/30/23 2335  WBC 9.9 11.5*  HGB 9.2* 7.5*  HCT 30.6* 25.0*  PLT 1,270* 1,037*   BMET Recent Labs    06/30/23 1359 06/30/23 2335  NA 130* 129*  K 3.0* 3.0*  CL 79* 85*  CO2 30 27  GLUCOSE 103* 79  BUN 27* 18  CREATININE 0.92 0.80  CALCIUM 9.2 8.4*   PT/INR Recent Labs    06/30/23 1359  LABPROT 13.7  INR 1.0   ABG No results for input(s): "PHART", "HCO3" in the last 72 hours.  Invalid input(s): "PCO2", "PO2"  Studies/Results: [place] EKG SITE RITE  Result Date: 06/30/2023 If Site Rite image not attached, placement could not be confirmed due to current cardiac rhythm.  CT ABDOMEN PELVIS W CONTRAST  Result Date: 06/30/2023 CLINICAL DATA:  Postoperative abdominal pain. Gastrostomy tube placed on 07/03. Subsequent weight loss with discomfort after eating. Left-sided abdominal pain. EXAM: CT ABDOMEN AND PELVIS WITH CONTRAST TECHNIQUE: Multidetector CT imaging of the abdomen and pelvis was performed using the standard protocol  following bolus administration of intravenous contrast. RADIATION DOSE REDUCTION: This exam was performed according to the departmental dose-optimization program which includes automated exposure control, adjustment of the mA and/or kV according to patient size and/or use of iterative reconstruction technique. CONTRAST:  75mL OMNIPAQUE IOHEXOL 350 MG/ML SOLN COMPARISON:  05/12/2022 and 05/16/2019 FINDINGS: Lower chest: Lung bases are clear. Hepatobiliary: No focal liver abnormality is seen. No gallstones, gallbladder wall thickening, or biliary dilatation. Pancreas: Unremarkable. No pancreatic ductal dilatation or surrounding inflammatory changes. Vascular coils adjacent to the pancreatic head. Spleen: Normal in size without focal abnormality. Adrenals/Urinary Tract: No adrenal gland nodules. Bilateral hydronephrosis without hydroureter. No obstructing lesions are identified. Nephrograms are homogeneous and symmetrical. Bladder is normal. Stomach/Bowel: Postoperative changes with percutaneous gastrostomy tube in place. Stomach is moderately distended with ingested material. Postoperative changes suggesting partial gastrectomy. Multiple dilated gas and fluid-filled loops of small bowel with small bowel feces sign suggesting chronic obstruction. Terminal ileum is decompressed. Stool-filled colon without abnormal distention or wall thickening. Appendix is not identified. Vascular/Lymphatic: Aortic atherosclerosis. No enlarged abdominal or pelvic lymph nodes. Reproductive: No pelvic mass. Other: Small amount of free intra-abdominal air is likely postoperative. Free air is demonstrated in the left inguinal canal and left scrotum likely extending from the intraperitoneal air. No free fluid. Musculoskeletal: No acute or significant osseous findings. IMPRESSION: 1. Postoperative changes in the stomach including partial gastric resection and gastrostomy tube placement. Stomach is somewhat distended with ingested material. 2.  Prominent dilatation of small bowel with gas  and fluid and including small bowel feces sign suggesting chronic obstruction. Terminal ileum is decompressed. No pneumatosis or wall thickening. 3. Small amount of free air including free air in the left inguinal canal and left scrotum likely resulting from recent surgery. 4. Aortic atherosclerosis. 5. Prominent stool in the colon consistent with constipation. Electronically Signed   By: Burman Nieves M.D.   On: 06/30/2023 19:14   DG Chest 2 View  Result Date: 06/30/2023 CLINICAL DATA:  Suspected Sepsis EXAM: CHEST - 2 VIEW COMPARISON:  CXR 06/19/23 FINDINGS: No pleural effusion. No pneumothorax. No focal airspace opacity. Normal cardiac and mediastinal contours. No radiographically apparent displaced rib. Visualized upper abdomen is unremarkable. Gastrostomy tube in the left upper quadrant. Vertebral body heights are maintained. IMPRESSION: No focal airspace opacity. Electronically Signed   By: Lorenza Cambridge M.D.   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     Delayed gastric emptying/gastroparesis from chronic obstruction Dehydration Severe protein calorie malnutrition Acute kidney injury Urinary retention Hypochloremia and hyponatremia Hypokalemia ABL anemia with chronic anemia of chronic disease Thrombocytosis  CT shows expected changes with distended stomach with liquid and food material as well as constipation.  No evidence of abscess Picc and start TPN today.  Bowel regimen, will discuss enema with patient.  Replete K Chloride improving.  Na stable. Anemia more pronounced since rehydration- stable since post op and discharge.   Enoxaparin for VTE ppx.    Dispo:  inpatient. Overall goal would be getting stomach decompressed, working on constipation, starting TNA and sending home with TNA if possible.  Given distention of stomach, anticipate that it will take a while for the delayed gastric emptying to resolve.    Discussed with nursing g tube situation as it was not placed to gravity and wasn't being flushed.     LOS: 1 day    Almond Lint 07/01/2023

## 2023-07-01 NOTE — Progress Notes (Signed)
PHARMACY - TOTAL PARENTERAL NUTRITION CONSULT NOTE   Indication:  severe protein calorie malnutrition and delayed gastric emptying  Patient Measurements: Weight: 52 kg (114 lb 11.2 oz) (standing scale)   Body mass index is 17.44 kg/m. 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 Patient was started on CLD 7/5 and tolerating soft diet 7/9. Patient had BM 7/11 and was discharged. At home, patient had increasing abdominal pain and distention with poor PO intake. Patient had 15 lb weight loss in 2.5 months. Gtube to gravity with frequent clogging per patient.  Pharmacy consulted for TPN.  Glucose / Insulin: no hx DM - CBGs < 100, no isnulin Electrolytes: K 3 (on NS + Kcl 20 mEq/L @ 125/hr), Na 129, CoCa 9.3, Phos 3.2, Mg 2.2 Renal: Scr 0.8, BUN 18 - stable Hepatic: LFTs wnl, lipase 115, alk phos wnl, Tbili wnl Intake / Output; MIVF: NS + Kcl 20 mEq/L @ 125/hr; G tube to gravity GI Imaging: 7/16 CTAP: chronic obstruction GI Surgeries / Procedures:  7/3 distal gastrectomy and G-tube placement 7/3. Patient was started on CLD 7/5 and tolerating soft diet 7/9. Patient had BM 7/11 and was discharged.  Central access: PICC ordered 7/16 TPN start date: 7/17  Nutritional Goals: Goal TPN rate is 75 mL/hr which will provide 90g AA and 1756 kcal per day meeting 100% of needs  RD Assessment:  Estimated Needs Total Energy Estimated Needs: 1700-1900 Total Protein Estimated Needs: 85-95 grams Total Fluid Estimated Needs: >/= 1.8 L/day  Current Nutrition:  NPO, TPN to start 7/17  Plan:  Start TPN at 61mL/hr at 1800 to meet ~50% of estimated needs Electrolytes in TPN: Na 44mEq/L, K 59mEq/L, Ca 45mEq/L, Mg 68mEq/L, and Phos 18mmol/L. Cl:Ac 1:1 Add standard MVI and trace elements to TPN Initiate Sensitive q4h SSI and adjust as needed   Reduce MIVF to NS only @40  mL/hr at 1800 - additional MIVF per MD Monitor TPN labs on Mon/Thurs, daily labs until TPN at goal rate  Give Kcl 10 mEq IV x 6 runs  Thiamine 100mg  x 7 days per d/w RD  Rexford Maus, PharmD, BCPS 07/01/2023 9:51 AM

## 2023-07-01 NOTE — Progress Notes (Signed)
Initial Nutrition Assessment  DOCUMENTATION CODES:   Severe malnutrition in context of chronic illness, Underweight  INTERVENTION:  Provide TPN per pharmacy.  Monitor magnesium, potassium, and phosphorus daily for at least 3 days, MD to replete as needed, as pt is at risk for refeeding syndrome.  Provide thiamine 100 mg IV x 7 days due to risk for refeeding syndrome. Defer to pharmacy whether this is provided separately via IV or put into TPN.  Recommend measuring daily weights on TPN.  NUTRITION DIAGNOSIS:   Severe Malnutrition related to chronic illness (history of gastric outlet obstruction s/p distal gastrectomy now with ongoing distention and difficulty eating, suspect acute component on top of chronic malnutrition) as evidenced by severe fat depletion, severe muscle depletion, percent weight loss.  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 G-tube placement (for venting) on 7/3. Pt now admitted with weight loss, abdominal distention, difficulty eating, and difficult urination.  Pt is NPO and plan is to start TPN today.  Met with pt at bedside. He reports he has had issues with ulcers and eating for the past 3-4 years. It got worse in the 6-8 months prior to his recent surgery. He would experiencing extreme bloating with eating, belching, nausea, emesis, and pain. He reports he would previously still try to eat 2-3 meals per day and would eat a fairly normal diet except he  would avoid greasy or fried foods that would worsen symptoms. Since recent discharge home he was eating softer or liquid foods. He would have Ensure, Pedialyte, yogurt, pudding, jello, and fruit cups. The night prior to admission he tried to eat some scrambled eggs in addition to a fruit cup and could not tolerate the scrambled eggs. He also reports the G-tube would frequently clog so he was not always able to vent/drain very well. Pt denies food allergies or intolerances. Reports nausea and emesis have improved since surgery. Reports some abdominal pain currently. Denies difficulty with chewing/swallowing.  Pt reports his UBW was 160-165 lbs (72.7-75 kg) prior to weight loss over the past few years. He reports recently at the end of June he was around 129 lbs (58.6 kg) and has lost more weight since then. Per review of chart pt was 68.5 kg in 2020. He was 60.1 kg on 06/05/23. RD obtained standing scale weight of 52 kg (114.7 lbs) today. Pt has lost 8.1 kg or 13.5% weight over the past month, which is significant for time frame.  Medications reviewed and include: Reglan 10 mg every 6 hours IV, pantoprazole, Miralax 17 grams daily, senna 1 tablet BID, NS with KCl 20 mEq/L at 125 mL/hour, potassium chloride 10 mEq every 1 hour x 6 IV today  Labs reviewed: On 7/16 sodium 129, potassium 3, chloride 85, calcium 8.4 (corrects to 9.28 with albumin 2.9), phosphorus WNL at 3.2, magnesium WNL at 2.2  IV Access: PIV x 2 Order in for PICC placement   Pt with 20 Fr. G-tube placed on 06/17/23; pt reports plan is to  place to drainage today  UOP: none documented since admission (only admitted 19 hours)  I/O: +2218.3 mL since admission  Discussed with pharmacy via secure chat.  NUTRITION - FOCUSED PHYSICAL EXAM:  Flowsheet Row Most Recent Value  Orbital Region Severe depletion  Upper Arm Region Severe depletion  Thoracic and Lumbar Region Severe depletion  Buccal Region Severe depletion  Temple Region Severe  depletion  Clavicle Bone Region Severe depletion  Clavicle and Acromion Bone Region Severe depletion  Scapular Bone Region Severe depletion  Dorsal Hand Severe depletion  Patellar Region Severe depletion  Anterior Thigh Region Severe depletion  Posterior Calf Region Severe depletion  Edema (RD Assessment) None  Hair Reviewed  Eyes Reviewed  Mouth Reviewed  Skin Reviewed  Nails Reviewed      Diet Order:   Diet Order             Diet NPO time specified Except for: Ice Chips, Sips with Meds  Diet effective now                  EDUCATION NEEDS:   Education needs have been addressed (Answered patient's questions about TPN.)  Skin:  Skin Assessment: Reviewed RN Assessment  Last BM:  Unknown/PTA  Height:   Ht Readings from Last 1 Encounters:  06/17/23 5\' 8"  (1.727 m)   Weight:   Wt Readings from Last 1 Encounters:  07/01/23 52 kg   Ideal Body Weight:  70 kg  BMI:  Body mass index is 17.44 kg/m.  Estimated Nutritional Needs:   Kcal:  1700-1900  Protein:  85-95 grams  Fluid:  >/= 1.8 L/day  Letta Median, MS, RD, LDN, CNSC Pager number available on Amion

## 2023-07-02 DIAGNOSIS — K30 Functional dyspepsia: Secondary | ICD-10-CM | POA: Diagnosis not present

## 2023-07-02 DIAGNOSIS — E871 Hypo-osmolality and hyponatremia: Secondary | ICD-10-CM

## 2023-07-02 LAB — URINALYSIS, W/ REFLEX TO CULTURE (INFECTION SUSPECTED)
Bacteria, UA: NONE SEEN
Bilirubin Urine: NEGATIVE
Glucose, UA: NEGATIVE mg/dL
Hgb urine dipstick: NEGATIVE
Ketones, ur: NEGATIVE mg/dL
Leukocytes,Ua: NEGATIVE
Nitrite: NEGATIVE
Protein, ur: NEGATIVE mg/dL
Specific Gravity, Urine: 1.009 (ref 1.005–1.030)
pH: 6 (ref 5.0–8.0)

## 2023-07-02 LAB — COMPREHENSIVE METABOLIC PANEL
ALT: 12 U/L (ref 0–44)
AST: 15 U/L (ref 15–41)
Albumin: 2.6 g/dL — ABNORMAL LOW (ref 3.5–5.0)
Alkaline Phosphatase: 62 U/L (ref 38–126)
Anion gap: 7 (ref 5–15)
BUN: 13 mg/dL (ref 6–20)
CO2: 24 mmol/L (ref 22–32)
Calcium: 7.6 mg/dL — ABNORMAL LOW (ref 8.9–10.3)
Chloride: 94 mmol/L — ABNORMAL LOW (ref 98–111)
Creatinine, Ser: 0.53 mg/dL — ABNORMAL LOW (ref 0.61–1.24)
GFR, Estimated: >60 mL/min (ref >60)
Glucose, Bld: 110 mg/dL — ABNORMAL HIGH (ref 70–99)
Potassium: 3.5 mmol/L (ref 3.5–5.1)
Sodium: 125 mmol/L — ABNORMAL LOW (ref 135–145)
Total Bilirubin: 0.2 mg/dL — ABNORMAL LOW (ref 0.3–1.2)
Total Protein: 6.2 g/dL — ABNORMAL LOW (ref 6.5–8.1)

## 2023-07-02 LAB — MAGNESIUM
Magnesium: 2.2 mg/dL (ref 1.7–2.4)
Magnesium: 2.1 mg/dL (ref 1.7–2.4)

## 2023-07-02 LAB — SODIUM, URINE, RANDOM: Sodium, Ur: 31 mmol/L

## 2023-07-02 LAB — BASIC METABOLIC PANEL
Anion gap: 8 (ref 5–15)
BUN: 14 mg/dL (ref 6–20)
CO2: 23 mmol/L (ref 22–32)
Calcium: 7.7 mg/dL — ABNORMAL LOW (ref 8.9–10.3)
Chloride: 96 mmol/L — ABNORMAL LOW (ref 98–111)
Creatinine, Ser: 0.51 mg/dL — ABNORMAL LOW (ref 0.61–1.24)
GFR, Estimated: >60 mL/min (ref >60)
Glucose, Bld: 108 mg/dL — ABNORMAL HIGH (ref 70–99)
Potassium: 3.6 mmol/L (ref 3.5–5.1)
Sodium: 127 mmol/L — ABNORMAL LOW (ref 135–145)

## 2023-07-02 LAB — GLUCOSE, CAPILLARY
Glucose-Capillary: 108 mg/dL — ABNORMAL HIGH (ref 70–99)
Glucose-Capillary: 108 mg/dL — ABNORMAL HIGH (ref 70–99)
Glucose-Capillary: 110 mg/dL — ABNORMAL HIGH (ref 70–99)
Glucose-Capillary: 110 mg/dL — ABNORMAL HIGH (ref 70–99)
Glucose-Capillary: 110 mg/dL — ABNORMAL HIGH (ref 70–99)
Glucose-Capillary: 113 mg/dL — ABNORMAL HIGH (ref 70–99)
Glucose-Capillary: 120 mg/dL — ABNORMAL HIGH (ref 70–99)

## 2023-07-02 LAB — OSMOLALITY, URINE: Osmolality, Ur: 321 mOsm/kg (ref 300–900)

## 2023-07-02 LAB — OSMOLALITY: Osmolality: 271 mOsm/kg — ABNORMAL LOW (ref 275–295)

## 2023-07-02 LAB — PHOSPHORUS
Phosphorus: 2.2 mg/dL — ABNORMAL LOW (ref 2.5–4.6)
Phosphorus: 2.2 mg/dL — ABNORMAL LOW (ref 2.5–4.6)

## 2023-07-02 LAB — TSH: TSH: 1.321 u[IU]/mL (ref 0.350–4.500)

## 2023-07-02 MED ORDER — POTASSIUM CHLORIDE 10 MEQ/100ML IV SOLN: 10.0000 meq | INTRAVENOUS | Status: DC

## 2023-07-02 MED ORDER — POTASSIUM CHLORIDE 10 MEQ/100ML IV SOLN
10.0000 meq | INTRAVENOUS | Status: AC
  Administered 2023-07-02 (×2): 10 meq via INTRAVENOUS
  Filled 2023-07-02: qty 100

## 2023-07-02 MED ORDER — TRAVASOL 10 % IV SOLN
INTRAVENOUS | Status: AC
  Filled 2023-07-02: qty 900

## 2023-07-02 MED ORDER — POTASSIUM PHOSPHATES 15 MMOLE/5ML IV SOLN
10.0000 mmol | Freq: Once | INTRAVENOUS | Status: AC
  Administered 2023-07-02: 10 mmol via INTRAVENOUS
  Filled 2023-07-02: qty 3.33

## 2023-07-02 NOTE — Progress Notes (Signed)
Subjective/Chief Complaint:  Pt had to open g tube himself.    Objective: Vital signs in last 24 hours: Temp:  [97.4 F (36.3 C)-98.6 F (37 C)] 97.4 F (36.3 C) (07/18 0752) Pulse Rate:  [76-110] 76 (07/18 0752) Resp:  [16-17] 17 (07/18 0752) BP: (107-131)/(66-86) 107/66 (07/18 0752) SpO2:  [100 %] 100 % (07/18 0752) Weight:  [52 kg] 52 kg (07/18 0500)    Intake/Output from previous day: 07/17 0701 - 07/18 0700 In: 359.3 [I.V.:359.3] Out: -  Intake/Output this shift: No intake/output data recorded.  General appearance: alert, cooperative, and no distress Eyes: sunken eyes improved.  Resp: breathing comfortably GI: soft, much less distended than yesterday. G tube in place Extremities: extremities normal, atraumatic, no cyanosis or edema  Lab Results:  Recent Labs    06/30/23 1359 06/30/23 2335  WBC 9.9 11.5*  HGB 9.2* 7.5*  HCT 30.6* 25.0*  PLT 1,270* 1,037*   BMET Recent Labs    06/30/23 2335 07/01/23 1929 07/02/23 0308  NA 129*  --  125*  K 3.0* 3.9 3.5  CL 85*  --  94*  CO2 27  --  24  GLUCOSE 79  --  110*  BUN 18  --  13  CREATININE 0.80  --  0.53*  CALCIUM 8.4*  --  7.6*   PT/INR Recent Labs    06/30/23 1359  LABPROT 13.7  INR 1.0   ABG No results for input(s): "PHART", "HCO3" in the last 72 hours.  Invalid input(s): "PCO2", "PO2"  Studies/Results: [place] EKG SITE RITE  Result Date: 06/30/2023 If Site Rite image not attached, placement could not be confirmed due to current cardiac rhythm.  CT ABDOMEN PELVIS W CONTRAST  Result Date: 06/30/2023 CLINICAL DATA:  Postoperative abdominal pain. Gastrostomy tube placed on 07/03. Subsequent weight loss with discomfort after eating. Left-sided abdominal pain. EXAM: CT ABDOMEN AND PELVIS WITH CONTRAST TECHNIQUE: Multidetector CT imaging of the abdomen and pelvis was performed using the standard protocol following bolus administration of intravenous contrast. RADIATION DOSE REDUCTION: This exam was  performed according to the departmental dose-optimization program which includes automated exposure control, adjustment of the mA and/or kV according to patient size and/or use of iterative reconstruction technique. CONTRAST:  75mL OMNIPAQUE IOHEXOL 350 MG/ML SOLN COMPARISON:  05/12/2022 and 05/16/2019 FINDINGS: Lower chest: Lung bases are clear. Hepatobiliary: No focal liver abnormality is seen. No gallstones, gallbladder wall thickening, or biliary dilatation. Pancreas: Unremarkable. No pancreatic ductal dilatation or surrounding inflammatory changes. Vascular coils adjacent to the pancreatic head. Spleen: Normal in size without focal abnormality. Adrenals/Urinary Tract: No adrenal gland nodules. Bilateral hydronephrosis without hydroureter. No obstructing lesions are identified. Nephrograms are homogeneous and symmetrical. Bladder is normal. Stomach/Bowel: Postoperative changes with percutaneous gastrostomy tube in place. Stomach is moderately distended with ingested material. Postoperative changes suggesting partial gastrectomy. Multiple dilated gas and fluid-filled loops of small bowel with small bowel feces sign suggesting chronic obstruction. Terminal ileum is decompressed. Stool-filled colon without abnormal distention or wall thickening. Appendix is not identified. Vascular/Lymphatic: Aortic atherosclerosis. No enlarged abdominal or pelvic lymph nodes. Reproductive: No pelvic mass. Other: Small amount of free intra-abdominal air is likely postoperative. Free air is demonstrated in the left inguinal canal and left scrotum likely extending from the intraperitoneal air. No free fluid. Musculoskeletal: No acute or significant osseous findings. IMPRESSION: 1. Postoperative changes in the stomach including partial gastric resection and gastrostomy tube placement. Stomach is somewhat distended with ingested material. 2. Prominent dilatation of small bowel with gas  and fluid and including small bowel feces sign  suggesting chronic obstruction. Terminal ileum is decompressed. No pneumatosis or wall thickening. 3. Small amount of free air including free air in the left inguinal canal and left scrotum likely resulting from recent surgery. 4. Aortic atherosclerosis. 5. Prominent stool in the colon consistent with constipation. Electronically Signed   By: Burman Nieves M.D.   On: 06/30/2023 19:14   DG Chest 2 View  Result Date: 06/30/2023 CLINICAL DATA:  Suspected Sepsis EXAM: CHEST - 2 VIEW COMPARISON:  CXR 06/19/23 FINDINGS: No pleural effusion. No pneumothorax. No focal airspace opacity. Normal cardiac and mediastinal contours. No radiographically apparent displaced rib. Visualized upper abdomen is unremarkable. Gastrostomy tube in the left upper quadrant. Vertebral body heights are maintained. IMPRESSION: No focal airspace opacity. Electronically Signed   By: Lorenza Cambridge M.D.   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     Delayed gastric emptying/gastroparesis from chronic obstruction Dehydration Severe protein calorie malnutrition Acute kidney injury Urinary retention Hypochloremia and hyponatremia Hypokalemia ABL anemia with chronic anemia of chronic disease Thrombocytosis  CT shows expected changes with distended stomach with liquid and food material as well as constipation.  No evidence of abscess Picc and start TPN  Sips of clears.  Bowel regimen, will discuss enema with patient.  Replete K Chloride improving, but Na worse today. Consult TRH for hyponatremia Anemia more pronounced since rehydration- stable since post op and discharge.   Enoxaparin for VTE ppx.    Dispo: inpatient. Overall goal is getting stomach  decompressed, working on constipation, correcting electrolytes. Plan  home with TNA if possible.  Given distention of stomach, anticipate that it will take a while for the delayed gastric emptying to resolve.       LOS: 2 days    Almond Lint 07/02/2023

## 2023-07-02 NOTE — Progress Notes (Signed)
PHARMACY - TOTAL PARENTERAL NUTRITION CONSULT NOTE   Indication:  severe protein calorie malnutrition and delayed gastric emptying  Patient Measurements: Weight: 52 kg (114 lb 10.2 oz)   Body mass index is 17.43 kg/m. 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 Patient was started on CLD 7/5 and tolerating soft diet 7/9. Patient had BM 7/11 and was discharged. At home, patient had increasing abdominal pain and distention with poor PO intake. Patient had 15 lb weight loss in 2.5 months. Gtube to gravity with frequent clogging per patient.  Pharmacy consulted for TPN.  Glucose / Insulin: no hx DM - CBGs < 100(low of 64), no isnulin Electrolytes: K 3.5 (s/p KCl yesterday), Na 125, CoCa 8.7, Phos down 2.2, Mg 2.2 Renal: Scr 0.8, BUN 18 - stable Hepatic: LFTs wnl, lipase 115, alk phos wnl, Tbili wnl Intake / Output; MIVF: NS at 40; G tube to gravity *Reglan 10 q6 GI Imaging: 7/16 CTAP: chronic obstruction GI Surgeries / Procedures:  7/3 distal gastrectomy and G-tube placement 7/3. Patient was started on CLD 7/5 and tolerating soft diet 7/9. Patient had BM 7/11 and was discharged.  Central access: PICC ordered 7/16 TPN start date: 7/17  Nutritional Goals: Goal TPN rate is 75 mL/hr which will provide 90g AA and 1756 kcal per day meeting 100% of needs  RD Assessment:  Estimated Needs Total Energy Estimated Needs: 1700-1900 Total Protein Estimated Needs: 85-95 grams Total Fluid Estimated Needs: >/= 1.8 L/day  Current Nutrition:  NPO, TPN to start 7/17  Plan:  Increase TPN to goal at 75 mL/hr at 1800 to meet ~100% of estimated needs Electrolytes in TPN: Na 62mEq/L, K 60mEq/L, Ca 97mEq/L, Mg 66mEq/L, and Phos 15 mmol/L. Cl:Ac 1:1 Add standard MVI and trace elements to TPN Initiate Sensitive q4h SSI and adjust as  needed  Stop MIVF of NS at 1800 with start of goal PTN Monitor TPN labs on Mon/Thurs, daily labs until TPN at goal rate  Give Kphos 10 mmol x1 and Kcl 10 mEq IV x 2runs  Thiamine 100mg  x 7 days per d/w RD  Note plan to send home with TPN - will increase to goal today and if does well, plan to start cycling soon to help with discharge.   Link Snuffer, PharmD, BCPS, BCCCP Clinical Pharmacist Please refer to [facility] for [facility] Pharmacy numbers 07/02/2023 8:12 AM

## 2023-07-02 NOTE — Consult Note (Signed)
History and Physical    Alan Jennings:[id number] DOB: 1981/07/30 DOA: 06/30/2023  PCP: Vertis Kelch, NP   I have personally briefly reviewed patient's old medical records in [facility]  Chief Complaint: Hyponatremia   HPI: Alan Jennings is a 42 y.o. 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 Patient stabilized and discharged home on 7/11 s/p tolerating soft diet. However at home patient noted he was unable to tolerated po and had increase abdominal pain and abdominal distention with frequent clogging of g-tube. Due to this patient returned to ED and was noted to have dehydration AKI, Urinary retention and electrolyte abnormalities. Patient was admitted and started on TPN with bowel rest and stomach decompression.   Course further complicated by drop in NA to 125 today from 7/3- 133 with baseline around 135.   Patient currently noted pain is well controlled, noted no n/v and that abdominal distention has improved with decompression. He notes no HA, weakness , no mention of seizure activity or confusion.    Labs: Urine NA 31, Urine osmo 321, Ua negative specific gravity 1.009 Serum osmo271 TSH 1.3 Na 125, on repeat 127    Review of Systems: As per HPI otherwise 10 point review of systems negative.   Past Medical History:  Diagnosis Date   Abdominal pain, chronic, epigastric    Anxiety    Arthritis    Back pain    Complication of anesthesia    had a syncopal episode after rhinoplasty   Depression    GERD (gastroesophageal reflux disease)    GI bleed    Headache    History of kidney stones    Hypertension    Neurogenic pain    Peptic ulcer disease    PTSD (post-traumatic stress disorder)    Seizures (HCC)    Seizures (HCC)     Past Surgical History:  Procedure Laterality Date    BIOPSY  06/25/2018   Procedure: BIOPSY;  Surgeon: Malissa Hippo, MD;  [facility];  Service: Endoscopy;;  gastric   BIOPSY  05/21/2023   Procedure: BIOPSY;  Surgeon: Lynann Bologna, DO;  Location: [facility];  Service: Gastroenterology;;   ESOPHAGOGASTRODUODENOSCOPY N/A 05/21/2023   Procedure: ESOPHAGOGASTRODUODENOSCOPY (EGD);  Surgeon: Lynann Bologna, DO;  Location: [facility];  Service: Gastroenterology;  Laterality: N/A;   ESOPHAGOGASTRODUODENOSCOPY (EGD) WITH PROPOFOL N/A 06/25/2018   Procedure: ESOPHAGOGASTRODUODENOSCOPY (EGD) WITH PROPOFOL;  Surgeon: Malissa Hippo, MD;  [facility]: [facility];  Service: Endoscopy;  Laterality: N/A;   ESOPHAGOGASTRODUODENOSCOPY (EGD) WITH PROPOFOL N/A 05/13/2022   Procedure: ESOPHAGOGASTRODUODENOSCOPY (EGD) WITH PROPOFOL;  Surgeon: Dolores Frame, MD;  Location: [facility];  Service: Gastroenterology;  Laterality: N/A;   GASTROSTOMY  06/17/2023   Procedure: INSERTION OF GASTROSTOMY TUBE;  Surgeon: Almond Lint, MD;  Location: [facility] OR;  Service: General;;   NOSE SURGERY     [facility]   ORIF HUMERUS FRACTURE  04/26/2012   Procedure: OPEN REDUCTION INTERNAL FIXATION (ORIF) PROXIMAL HUMERUS FRACTURE;  Surgeon: Vickki Hearing, MD;  Location: [facility] ORS;  Service: Orthopedics;  Laterality: Left;  Open Reduction Internal Fixation of Left Proximal Humerus Fracture, Closing Wedge Osteotomy, Bone Graft   RHINOPLASTY  2003   [facility]   SHOULDER SURGERY Left 2013     reports that he quit smoking about 5 years ago. His smoking use included  e-cigarettes and cigarettes. He has never used smokeless tobacco. He reports that he does not currently use drugs after having used the following drugs: Marijuana. He reports that he does not drink alcohol.  Allergies  Allergen Reactions   Tramadol Other (See Comments)    SEIZURES   Nsaids Other (See Comments)    Has caused ulcers and needs to avoid    Family History  Problem Relation Age of  Onset   Heart disease Other    Arthritis Other    Anesthesia problems Neg Hx    Hypotension Neg Hx    Malignant hyperthermia Neg Hx    Pseudochol deficiency Neg Hx     Prior to Admission medications   Medication Sig Start Date End Date Taking? Authorizing Provider  acetaminophen (TYLENOL) 500 MG tablet Take 500-1,000 mg by mouth every 6 (six) hours as needed (for headaches).   Yes [provider]  buprenorphine (SUBUTEX) 8 MG SUBL SL tablet Dissolve 1 tablet in mouth 3 times daily Patient taking differently: Place 8 mg under the tongue 3 (three) times daily. 06/09/23  Yes   calcium carbonate (TUMS - DOSED IN MG ELEMENTAL CALCIUM) 500 MG chewable tablet Chew 1 tablet by mouth daily as needed for indigestion or heartburn.   Yes [provider]  EMETROL 230 MG CHEW Chew 460 mg by mouth 2 (two) times daily as needed (for nausea).   Yes [provider]  HYDROmorphone (DILAUDID) 2 MG tablet Take 1-2 tablets (2-4 mg total) by mouth every 4 (four) hours as needed for moderate pain or severe pain. 06/25/23  Yes Almond Lint, MD  lamoTRIgine (LAMICTAL) 200 MG tablet Take 1 tablet (200 mg total) by mouth daily. Patient taking differently: Take 200 mg by mouth at bedtime. 06/09/23  Yes   LORazepam (ATIVAN) 0.5 MG tablet Take 1 tablet (0.5 mg total) by mouth daily as needed for severe anxiety (must last 30 days per md) Patient taking differently: Take 0.5 mg by mouth daily as needed for anxiety. 04/18/21  Yes   pantoprazole (PROTONIX) 40 MG tablet Take 1 tablet (40 mg total) by mouth daily. 05/22/23  Yes Lewie Chamber, MD  promethazine (PHENERGAN) 12.5 MG tablet Take 1 tablet (12.5 mg total) by mouth every 6 (six) hours as needed for nausea or vomiting. 06/25/23  Yes Almond Lint, MD  QUEtiapine (SEROQUEL) 25 MG tablet Take 1 tablet (25 mg) by mouth daily as needed for insomnia Patient taking differently: Take 25 mg by mouth daily as needed (sleep). 06/09/23  Yes   busPIRone (BUSPAR)  30 MG tablet Take 1 tablet (30 mg) by mouth 2 times daily with a meal. Patient not taking: Reported on 06/30/2023 06/09/23     prazosin (MINIPRESS) 2 MG capsule Take 1 capsule (2 mg) by mouth at bedtime. Patient not taking: Reported on 06/30/2023 06/09/23     sucralfate (CARAFATE) 1 g tablet Take 1 tablet (1 g total) by mouth 4 (four) times daily. Patient not taking: Reported on 06/30/2023 05/22/23 05/21/24  Lewie Chamber, MD    Physical Exam: Vitals:   07/02/23 0500 07/02/23 0752 07/02/23 1143 07/02/23 1518  BP:  107/66 114/72 110/60  Pulse:  76 80 78  Resp:  17 17 18   Temp:  (!) 97.4 F (36.3 C) (!) 97.5 F (36.4 C) 98.2 F (36.8 C)  TempSrc:   Oral Oral  SpO2:  100% 100% 97%  Weight: 52 kg       Constitutional: NAD, calm, comfortable Vitals:  07/02/23 0500 07/02/23 0752 07/02/23 1143 07/02/23 1518  BP:  107/66 114/72 110/60  Pulse:  76 80 78  Resp:  17 17 18   Temp:  (!) 97.4 F (36.3 C) (!) 97.5 F (36.4 C) 98.2 F (36.8 C)  TempSrc:   Oral Oral  SpO2:  100% 100% 97%  Weight: 52 kg      Eyes: PERRL, lids and conjunctivae normal ENMT: Mucous membranes are moist. Posterior pharynx clear of any exudate or lesions.Normal dentition.  Neck: normal, supple, no masses, no thyromegaly Respiratory: clear to auscultation bilaterally, no wheezing, no crackles. Normal respiratory effort. No accessory muscle use.  Cardiovascular: Regular rate and rhythm, no murmurs / rubs / gallops. No extremity edema. 2+ pedal pulses. No carotid bruits.  Abdomen: appropriate tenderness, no masses palpated. No hepatosplenomegaly. Bowel sounds positive.  G tube in place draining well gastric fluid Musculoskeletal: no clubbing / cyanosis. No joint deformity upper and lower extremities. Good ROM, no contractures. Normal muscle tone.  Skin: no rashes, lesions, ulcers. No induration Neurologic: CN 2-12 grossly intact. Sensation intact,Strength 5/5 in all 4.  Psychiatric: Normal judgment and insight. Alert and  oriented x 3. Normal mood.    Labs on Admission: I have personally reviewed following labs and imaging studies  CBC: Recent Labs  Lab 06/30/23 1359 06/30/23 2335  WBC 9.9 11.5*  NEUTROABS 7.7  --   HGB 9.2* 7.5*  HCT 30.6* 25.0*  MCV 74.8* 74.2*  PLT 1,270* 1,037*   Basic Metabolic Panel: Recent Labs  Lab 06/30/23 1359 06/30/23 2335 07/01/23 1929 07/02/23 0308 07/02/23 1235  NA 130* 129*  --  125* 127*  K 3.0* 3.0* 3.9 3.5 3.6  CL 79* 85*  --  94* 96*  CO2 30 27  --  24 23  GLUCOSE 103* 79  --  110* 108*  BUN 27* 18  --  13 14  CREATININE 0.92 0.80  --  0.53* 0.51*  CALCIUM 9.2 8.4*  --  7.6* 7.7*  MG  --  2.2  --  2.2 2.1  PHOS  --  3.2  --  2.2* 2.2*   GFR: Estimated Creatinine Clearance: 89.4 mL/min (A) (by C-G formula based on SCr of 0.51 mg/dL (L)). Liver Function Tests: Recent Labs  Lab 06/30/23 1359 06/30/23 2335 07/02/23 0308  AST 21 17 15   ALT 18 13 12   ALKPHOS 99 75 62  BILITOT 0.6 0.7 0.2*  PROT 8.8* 7.1 6.2*  ALBUMIN 3.7 2.9* 2.6*   Recent Labs  Lab 06/30/23 1434  LIPASE 115*   No results for input(s): "AMMONIA" in the last 168 hours. Coagulation Profile: Recent Labs  Lab 06/30/23 1359  INR 1.0   Cardiac Enzymes: No results for input(s): "CKTOTAL", "CKMB", "CKMBINDEX", "TROPONINI" in the last 168 hours. BNP (last 3 results) No results for input(s): "PROBNP" in the last 8760 hours. HbA1C: No results for input(s): "HGBA1C" in the last 72 hours. CBG: Recent Labs  Lab 07/02/23 0132 07/02/23 0450 07/02/23 0747 07/02/23 1145 07/02/23 1542  GLUCAP 110* 108* 108* 113* 110*   Lipid Profile: No results for input(s): "CHOL", "HDL", "LDLCALC", "TRIG", "CHOLHDL", "LDLDIRECT" in the last 72 hours. Thyroid Function Tests: Recent Labs    07/02/23 1235  TSH 1.321   Anemia Panel: No results for input(s): "VITAMINB12", "FOLATE", "FERRITIN", "TIBC", "IRON", "RETICCTPCT" in the last 72 hours. Urine analysis:    Component Value  Date/Time   COLORURINE COLORLESS (A) 05/16/2019 0611   APPEARANCEUR CLEAR 05/16/2019 0611   LABSPEC  1.003 (L) 05/16/2019 0611   PHURINE 8.0 05/16/2019 0611   GLUCOSEU NEGATIVE 05/16/2019 0611   HGBUR NEGATIVE 05/16/2019 0611   BILIRUBINUR NEGATIVE 05/16/2019 0611   KETONESUR NEGATIVE 05/16/2019 0611   PROTEINUR NEGATIVE 05/16/2019 0611   UROBILINOGEN 0.2 02/18/2012 1548   NITRITE NEGATIVE 05/16/2019 0611   LEUKOCYTESUR NEGATIVE 05/16/2019 3244    Radiological Exams on Admission: [place] EKG SITE RITE  Result Date: 06/30/2023 If Site Rite image not attached, placement could not be confirmed due to current cardiac rhythm.  CT ABDOMEN PELVIS W CONTRAST  Result Date: 06/30/2023 CLINICAL DATA:  Postoperative abdominal pain. Gastrostomy tube placed on 07/03. Subsequent weight loss with discomfort after eating. Left-sided abdominal pain. EXAM: CT ABDOMEN AND PELVIS WITH CONTRAST TECHNIQUE: Multidetector CT imaging of the abdomen and pelvis was performed using the standard protocol following bolus administration of intravenous contrast. RADIATION DOSE REDUCTION: This exam was performed according to the departmental dose-optimization program which includes automated exposure control, adjustment of the mA and/or kV according to patient size and/or use of iterative reconstruction technique. CONTRAST:  75mL OMNIPAQUE IOHEXOL 350 MG/ML SOLN COMPARISON:  05/12/2022 and 05/16/2019 FINDINGS: Lower chest: Lung bases are clear. Hepatobiliary: No focal liver abnormality is seen. No gallstones, gallbladder wall thickening, or biliary dilatation. Pancreas: Unremarkable. No pancreatic ductal dilatation or surrounding inflammatory changes. Vascular coils adjacent to the pancreatic head. Spleen: Normal in size without focal abnormality. Adrenals/Urinary Tract: No adrenal gland nodules. Bilateral hydronephrosis without hydroureter. No obstructing lesions are identified. Nephrograms are homogeneous and symmetrical. Bladder  is normal. Stomach/Bowel: Postoperative changes with percutaneous gastrostomy tube in place. Stomach is moderately distended with ingested material. Postoperative changes suggesting partial gastrectomy. Multiple dilated gas and fluid-filled loops of small bowel with small bowel feces sign suggesting chronic obstruction. Terminal ileum is decompressed. Stool-filled colon without abnormal distention or wall thickening. Appendix is not identified. Vascular/Lymphatic: Aortic atherosclerosis. No enlarged abdominal or pelvic lymph nodes. Reproductive: No pelvic mass. Other: Small amount of free intra-abdominal air is likely postoperative. Free air is demonstrated in the left inguinal canal and left scrotum likely extending from the intraperitoneal air. No free fluid. Musculoskeletal: No acute or significant osseous findings. IMPRESSION: 1. Postoperative changes in the stomach including partial gastric resection and gastrostomy tube placement. Stomach is somewhat distended with ingested material. 2. Prominent dilatation of small bowel with gas and fluid and including small bowel feces sign suggesting chronic obstruction. Terminal ileum is decompressed. No pneumatosis or wall thickening. 3. Small amount of free air including free air in the left inguinal canal and left scrotum likely resulting from recent surgery. 4. Aortic atherosclerosis. 5. Prominent stool in the colon consistent with constipation. Electronically Signed   By: Burman Nieves M.D.   On: 06/30/2023 19:14    EKG: Independently reviewed.  Assessment/Plan    Hyponatremia  -hyponatremia presumed to be multifactorial possibly due to medication(Lamictal) insetting of  pain, low volume and initiation of TPN 7/17  -it would appear however that volume and initial shifts with  TPN   may account for initial drop as patient  has been on Lamictal prior to this although a potentiating effect can not be ruled out. 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  Delayed gastric Emptying G tube clogging  -Managed by surgery    Severe Protein-calorie Malnutrition  Electrolyte abnormalities -on TPN -manage by pharmacy    DVT prophylaxis: per surgery   Thank you for consult will follow in am to ensure Na remains stable      Lurline Del MD [facility]   If 7PM-7AM, please contact night-coverage [email or web address] Password [facility]  07/02/2023, 4:05 PM

## 2023-07-03 DIAGNOSIS — K30 Functional dyspepsia: Secondary | ICD-10-CM | POA: Diagnosis not present

## 2023-07-03 LAB — MAGNESIUM: Magnesium: 2.4 mg/dL (ref 1.7–2.4)

## 2023-07-03 LAB — CBC
HCT: 21.9 % — ABNORMAL LOW (ref 39.0–52.0)
Hemoglobin: 6.4 g/dL — CL (ref 13.0–17.0)
MCH: 22.3 pg — ABNORMAL LOW (ref 26.0–34.0)
MCHC: 29.2 g/dL — ABNORMAL LOW (ref 30.0–36.0)
MCV: 76.3 fL — ABNORMAL LOW (ref 80.0–100.0)
Platelets: 863 10*3/uL — ABNORMAL HIGH (ref 150–400)
RBC: 2.87 MIL/uL — ABNORMAL LOW (ref 4.22–5.81)
RDW: 18.2 % — ABNORMAL HIGH (ref 11.5–15.5)
WBC: 9.6 10*3/uL (ref 4.0–10.5)
nRBC: 0 % (ref 0.0–0.2)

## 2023-07-03 LAB — TYPE AND SCREEN
ABO/RH(D): A POS
Antibody Screen: NEGATIVE

## 2023-07-03 LAB — BPAM RBC

## 2023-07-03 LAB — BASIC METABOLIC PANEL
Anion gap: 5 (ref 5–15)
BUN: 8 mg/dL (ref 6–20)
CO2: 26 mmol/L (ref 22–32)
Calcium: 7.7 mg/dL — ABNORMAL LOW (ref 8.9–10.3)
Chloride: 103 mmol/L (ref 98–111)
Creatinine, Ser: 0.48 mg/dL — ABNORMAL LOW (ref 0.61–1.24)
GFR, Estimated: >60 mL/min (ref >60)
Glucose, Bld: 119 mg/dL — ABNORMAL HIGH (ref 70–99)
Potassium: 3.4 mmol/L — ABNORMAL LOW (ref 3.5–5.1)
Sodium: 134 mmol/L — ABNORMAL LOW (ref 135–145)

## 2023-07-03 LAB — CULTURE, BLOOD (ROUTINE X 2)
Culture: NO GROWTH
Special Requests: ADEQUATE

## 2023-07-03 LAB — PHOSPHORUS: Phosphorus: 2.9 mg/dL (ref 2.5–4.6)

## 2023-07-03 LAB — GLUCOSE, CAPILLARY
Glucose-Capillary: 125 mg/dL — ABNORMAL HIGH (ref 70–99)
Glucose-Capillary: 128 mg/dL — ABNORMAL HIGH (ref 70–99)
Glucose-Capillary: 112 mg/dL — ABNORMAL HIGH (ref 70–99)
Glucose-Capillary: 107 mg/dL — ABNORMAL HIGH (ref 70–99)
Glucose-Capillary: 100 mg/dL — ABNORMAL HIGH (ref 70–99)

## 2023-07-03 LAB — PREPARE RBC (CROSSMATCH)

## 2023-07-03 MED ORDER — POTASSIUM CHLORIDE 10 MEQ/100ML IV SOLN
INTRAVENOUS | Status: AC
  Filled 2023-07-03: qty 100

## 2023-07-03 MED ORDER — POTASSIUM CHLORIDE 10 MEQ/100ML IV SOLN
10.0000 meq | INTRAVENOUS | Status: AC
  Administered 2023-07-03 (×2): 10 meq via INTRAVENOUS

## 2023-07-03 MED ORDER — SODIUM CHLORIDE 0.9% IV SOLUTION: Freq: Once | INTRAVENOUS | Status: AC

## 2023-07-03 MED ORDER — TRAVASOL 10 % IV SOLN
INTRAVENOUS | Status: AC
  Filled 2023-07-03: qty 900

## 2023-07-03 NOTE — Consult Note (Signed)
Consult Note   Patient: Alan Jennings XLK:[id number] DOB: September 09, 1981 DOA: 06/30/2023     3 DOS: the patient was seen and examined on 07/03/2023   Brief hospital course: 42yo with h/o anxiety/PTSD, seizure d/o, and gastric outlet obstruction from chronic duodenal ulcers related to NSAID use s/p distal gastrectomy and G-tube placement on 7/3.  He was hospitalized through 7/11 and discharged on soft diet but was unable to tolerate PO at home with worsening pain/distention and frequent clogging of the G-tube and returned to the ER.  He was started on TPN with bowel rest and stomach decompression.  Na++ decreased from 133 to 125, back up to 134.  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  s/p distal gastrectomy and G-tube placement 7/3 Delayed gastric Emptying G tube clogging  Started on TPN Now NPO other than sips Managed by surgery  Hyponatremia  Presumed to be multifactorial possibly due to medication (Lamictal) in setting of pain, low volume and initiation of TPN 7/17  Appears to have been corrected, back to 134 today  Anemia Baseline Hgb in 9-10 range Hgb 7.5 on 7/16 Hgb 6.4 today Tachycardia noted Will transfer 1 unit prbcs He may also benefit from IV iron infusion Patient consented for blood and agrees   Seizure d/o Continue Lamictal - has been on for some time so unlikely related to acute hyponatremia    Opiate dependence  Continue Suboxone Also on home dilaudid, consider tapering off if possible - would attempt to avoid opiates given chronic use of buprenorphine I have reviewed this patient in the [place] Controlled Substances Reporting System.  He is receiving medications from two providers but appears to be taking them as prescribed. -He IS at particularly high risk of opioid misuse, diversion, or overdose.   Anxiety d/o Continue lorazepam prn   Severe Protein-calorie Malnutrition   Nutrition Problem: Severe Malnutrition Etiology: chronic illness (history of gastric outlet obstruction s/p distal gastrectomy now with ongoing distention and difficulty eating, suspect acute component on top of chronic malnutrition) Signs/Symptoms: severe fat depletion, severe muscle depletion, percent weight loss Percent weight loss: 13.5 % (over the past month) Interventions: Refer to RD note for recommendations    TRH will continue to follow for now but if Na++ remains stable and anemia is improved will probably sign off on 7/20.   Subjective: Feeling better, up and about in his room.  Abdominal pain is improved.  Draining better.  Lots of bile + air in drainage bag.   Objective: Vitals:   07/03/23 0700 07/03/23 0858  BP: (!) 104/58 117/68  Pulse: 87 96  Resp: 16 19  Temp: 98.6 F (37 C) 98.3 F (36.8 C)  SpO2: 100% 100%    Intake/Output Summary (Last 24 hours) at 07/03/2023 1416 Last data filed at 07/03/2023 0545 Gross per 24 hour  Intake --  Output 1300 ml  Net -1300 ml   Filed Weights   07/01/23 0900 07/02/23 0500 07/03/23 0500  Weight: 52 kg 52 kg 53.1 kg    Exam:  General:  Appears calm and comfortable and is in NAD Eyes:  EOMI, normal lids, iris ENT:  grossly normal hearing, lips & tongue, mmm Neck:  no LAD, masses or thyromegaly Cardiovascular:  RR with mild tachycardia, no m/r/g. No LE edema.  Respiratory:   CTA bilaterally with no wheezes/rales/rhonchi.  Normal respiratory effort. Abdomen:  soft, thin, multiple surgical scars as well as drain  in place Skin:  no rash or induration seen on limited exam Musculoskeletal:  grossly normal tone BUE/BLE, good ROM, no bony abnormality Psychiatric:  grossly normal mood and affect, speech fluent and appropriate, AOx3 Neurologic:  CN 2-12 grossly intact, moves all extremities in coordinated fashion  Data Reviewed: I have reviewed the patient's lab results since admission.  Pertinent labs for today include:  Na++  134 K+ 3.4 Glucose 119 WBC 9.6 Hgb 6.4, 7.5 on 7/16 Platelets 863   Family Communication: None present   Time spent: 55 minutes  Author: Jonah Blue, MD 07/03/2023 2:16 PM  For on call review [email or web address].

## 2023-07-03 NOTE — Hospital Course (Addendum)
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  He was hospitalized through 7/11 and discharged on soft diet but was unable to tolerate PO at home with worsening pain/distention and frequent clogging of the G-tube and returned to the ER.  He was started on TPN with bowel rest and stomach decompression.  Na++ decreased from 133 to 125, back up to 134.

## 2023-07-03 NOTE — Plan of Care (Signed)

## 2023-07-03 NOTE — Progress Notes (Signed)
Desktop epic not available  Pt feeling better since g tube hooked to gravity bag.   Last vitals reviewed.  G tube w 1 liter of bilious output and air Abs soft. G tube in place.   A/p Unchanged from yesterday.  Appreciate medicine help w hyponatremia Continue ton for delayed gastric emptying  Allow Bariatric clears just for taste.

## 2023-07-03 NOTE — Progress Notes (Signed)
PHARMACY - TOTAL PARENTERAL NUTRITION CONSULT NOTE   Indication:  severe protein calorie malnutrition and delayed gastric emptying  Patient Measurements: Weight: 53.1 kg (117 lb 1.6 oz)   Body mass index is 17.8 kg/m. 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 Patient was started on CLD 7/5 and tolerating soft diet 7/9. Patient had BM 7/11 and was discharged. At home, patient had increasing abdominal pain and distention with poor PO intake. Patient had 15 lb weight loss in 2.5 months. Gtube to gravity with frequent clogging per patient.  Pharmacy consulted for TPN.  Glucose / Insulin: no hx DM - CBGs 110-128, no insulin Electrolytes: K 3.6>> 3.4 (s/p KCl + Kphos 10 mmol), Na 127>> 134, CoCa 8.8, Phos 2.2>> 2.9 (s/p Kphos ), Mg 2.1>> 2.4, Cl 103, CO2 26 Renal: Scr 0.48, BUN 8 - stable Hepatic: LFTs wnl, lipase 115, alk phos wnl, Tbili wnl Intake / Output; MIVF: G tube to gravity - output *Reglan 10 q6 GI Imaging: 7/16 CTAP: chronic obstruction GI Surgeries / Procedures:  7/3 distal gastrectomy and G-tube placement 7/3. Patient was started on CLD 7/5 and tolerating soft diet 7/9. Patient had BM 7/11 and was discharged.  Central access: PICC ordered 7/16 TPN start date: 7/17  Nutritional Goals: Goal TPN rate is 75 mL/hr which will provide 90g AA and 1756 kcal per day meeting 100% of needs  RD Assessment:  Estimated Needs Total Energy Estimated Needs: 1700-1900 Total Protein Estimated Needs: 85-95 grams Total Fluid Estimated Needs: >/= 1.8 L/day  Current Nutrition:  NPO, TPN to start 7/17  Plan:  Continue TPN at goal rate 75 mL/hr at 1800 to meet ~100% of estimated needs Electrolytes in TPN: reduce Na 71mEq/L (to prevent further rapid rise in Na), increase K to 54mEq/L, Ca 36mEq/L, reduce Mg to  58mEq/L, and Phos 15 mmol/L. Cl:Ac 1:1 Add standard MVI and trace elements to TPN Continue Sensitive q4h SSI and adjust as needed  Monitor TPN labs on Mon/Thurs, daily labs until TPN at goal rate Thiamine 100mg  x 7 days per d/w RD (day 3) Give Kcl 10 mEq IV x 2 runs  Note plan to send home with TPN - will increase to goal today and if does well, plan to start cycling soon to help with discharge. Give one more day on continuous TPN (one day at goal rate thus far) with plans to cycle over weekend.  Rexford Maus, PharmD, BCPS 07/03/2023 9:07 AM

## 2023-07-04 DIAGNOSIS — K30 Functional dyspepsia: Secondary | ICD-10-CM | POA: Diagnosis not present

## 2023-07-04 LAB — BASIC METABOLIC PANEL
Anion gap: 9 (ref 5–15)
BUN: 7 mg/dL (ref 6–20)
CO2: 23 mmol/L (ref 22–32)
Calcium: 8.5 mg/dL — ABNORMAL LOW (ref 8.9–10.3)
Chloride: 104 mmol/L (ref 98–111)
Creatinine, Ser: 0.57 mg/dL — ABNORMAL LOW (ref 0.61–1.24)
GFR, Estimated: >60 mL/min (ref >60)
Glucose, Bld: 95 mg/dL (ref 70–99)
Potassium: 4.4 mmol/L (ref 3.5–5.1)
Sodium: 136 mmol/L (ref 135–145)

## 2023-07-04 LAB — CBC WITH DIFFERENTIAL/PLATELET
Abs Immature Granulocytes: 0.12 10*3/uL — ABNORMAL HIGH (ref 0.00–0.07)
Basophils Absolute: 0.1 10*3/uL (ref 0.0–0.1)
Basophils Relative: 1 %
Eosinophils Absolute: 0.1 10*3/uL (ref 0.0–0.5)
Eosinophils Relative: 1 %
HCT: 27.9 % — ABNORMAL LOW (ref 39.0–52.0)
Hemoglobin: 8.2 g/dL — ABNORMAL LOW (ref 13.0–17.0)
Immature Granulocytes: 1 %
Lymphocytes Relative: 22 %
Lymphs Abs: 2.3 10*3/uL (ref 0.7–4.0)
MCH: 23.4 pg — ABNORMAL LOW (ref 26.0–34.0)
MCHC: 29.4 g/dL — ABNORMAL LOW (ref 30.0–36.0)
MCV: 79.7 fL — ABNORMAL LOW (ref 80.0–100.0)
Monocytes Absolute: 0.7 10*3/uL (ref 0.1–1.0)
Monocytes Relative: 7 %
Neutro Abs: 7.1 10*3/uL (ref 1.7–7.7)
Neutrophils Relative %: 68 %
Platelets: 907 10*3/uL (ref 150–400)
RBC: 3.5 MIL/uL — ABNORMAL LOW (ref 4.22–5.81)
RDW: 19 % — ABNORMAL HIGH (ref 11.5–15.5)
WBC: 10.4 10*3/uL (ref 4.0–10.5)
nRBC: 0 % (ref 0.0–0.2)

## 2023-07-04 LAB — GLUCOSE, CAPILLARY
Glucose-Capillary: 104 mg/dL — ABNORMAL HIGH (ref 70–99)
Glucose-Capillary: 108 mg/dL — ABNORMAL HIGH (ref 70–99)
Glucose-Capillary: 116 mg/dL — ABNORMAL HIGH (ref 70–99)
Glucose-Capillary: 120 mg/dL — ABNORMAL HIGH (ref 70–99)
Glucose-Capillary: 98 mg/dL (ref 70–99)

## 2023-07-04 LAB — TYPE AND SCREEN: Unit division: 0

## 2023-07-04 LAB — PHOSPHORUS: Phosphorus: 3.2 mg/dL (ref 2.5–4.6)

## 2023-07-04 LAB — BPAM RBC
Blood Product Expiration Date: 202408142359
ISSUE DATE / TIME: 202407191830
Unit Type and Rh: 6200

## 2023-07-04 LAB — CULTURE, BLOOD (ROUTINE X 2): Culture: NO GROWTH

## 2023-07-04 LAB — MAGNESIUM: Magnesium: 2 mg/dL (ref 1.7–2.4)

## 2023-07-04 MED ORDER — INSULIN ASPART 100 UNIT/ML IJ SOLN
0.0000 [IU] | Freq: Four times a day (QID) | INTRAMUSCULAR | Status: DC
  Administered 2023-07-07: 1 [IU] via SUBCUTANEOUS

## 2023-07-04 MED ORDER — TRAVASOL 10 % IV SOLN
INTRAVENOUS | Status: AC
  Filled 2023-07-04: qty 900

## 2023-07-04 NOTE — Progress Notes (Addendum)
PHARMACY - TOTAL PARENTERAL NUTRITION CONSULT NOTE   Indication:  severe protein calorie malnutrition and delayed gastric emptying  Patient Measurements: Weight: 51.8 kg (114 lb 4.8 oz)   Body mass index is 17.38 kg/m. 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 Patient was started on CLD 7/5 and tolerating soft diet 7/9. Patient had BM 7/11 and was discharged. At home, patient had increasing abdominal pain and distention with poor PO intake. Patient had 15 lb weight loss in 2.5 months. Gtube to gravity with frequent clogging per patient.  Pharmacy consulted for TPN.  Glucose / Insulin: no hx DM - CBGs 95-120, no insulin Electrolytes: K 3.4 >> 4.4 (s/p KCl), Na 134>> 136, CoCa 9.6, Phos 3.2, Mg 2, Cl 104, CO2 23 Renal: Scr 0.57, BUN 8 - stable Hepatic: LFTs wnl, lipase 115, alk phos wnl, Tbili wnl Intake / Output; MIVF: G tube to gravity - 2400 mL output - Reglan 10 q6h IV  GI Imaging: 7/16 CTAP: chronic obstruction GI Surgeries / Procedures:  7/3 distal gastrectomy and G-tube placement 7/3. Patient was started on CLD 7/5 and tolerating soft diet 7/9. Patient had BM 7/11 and was discharged.  Central access: PICC ordered 7/16 TPN start date: 7/17  Nutritional Goals: Goal TPN rate is 75 mL/hr which will provide 90g AA and 1756 kcal per day meeting 100% of needs  RD Assessment:  Estimated Needs Total Energy Estimated Needs: 1700-1900 Total Protein Estimated Needs: 85-95 grams Total Fluid Estimated Needs: >/= 1.8 L/day  Current Nutrition:  NPO, TPN to start 7/17  Plan:  Cycle TPN over 18 hours at goal rate 53-106 mL/hr starting at 1800 to meet 100% of estimated needs Electrolytes in TPN: Na to 85mEq/L (to prevent further rapid rise in Na), K to 47mEq/L, Ca 11mEq/L, Mg to 75mEq/L, Phos 15 mmol/L. Cl:Ac 1:1 Add  standard MVI and trace elements to TPN Continue Sensitive q6h SSI and adjust as needed  Monitor TPN labs on Mon/Thurs, daily labs until TPN at goal rate Thiamine 100mg  x 7 days per d/w RD (day 4)  Confirmed with Surgery, current plan is to send home with TPN, starting to cycle today and continue to increase cycle duration as able pending toleration over the next few days  Thank you for allowing pharmacy to be a part of this patient's care.  Thelma Barge, PharmD Clinical Pharmacist

## 2023-07-04 NOTE — Plan of Care (Signed)

## 2023-07-04 NOTE — Consult Note (Signed)
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  He was hospitalized through 7/11 and discharged on soft diet but was unable to tolerate PO at home with worsening pain/distention and frequent clogging of the G-tube and returned to the ER.  He was started on TPN with bowel rest and stomach decompression.  Na++ decreased from 133 to 125, back up to 134.  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  s/p distal gastrectomy and G-tube placement 7/3 Delayed gastric Emptying G tube clogging  Started on TPN Now NPO other than sips Managed by surgery   Hyponatremia  Presumed to be multifactorial possibly due to medication (Lamictal) in setting of pain, low volume and initiation of TPN 7/17  Appears to have been corrected   Anemia Baseline Hgb in 9-10 range Hgb 7.5 on 7/16, 6.4 yesterday with tachycardia noted Transfused 1 unit prbcs with improvement to Hgb 8.2 He may also benefit from IV iron infusion   Seizure d/o Continue Lamictal - has been on for some time so unlikely related to acute hyponatremia    Opiate dependence  Continue Suboxone Also on home dilaudid, consider tapering off if possible - would attempt to avoid opiates given chronic use of buprenorphine I have reviewed this patient in the [place] Controlled Substances Reporting System.  He is receiving medications from two providers but appears to be taking them as prescribed. -He IS at particularly high risk of opioid misuse, diversion, or overdose.    Anxiety d/o Continue lorazepam prn   Severe Protein-calorie Malnutrition  Nutrition Problem: Severe  Malnutrition Etiology: chronic illness (history of gastric outlet obstruction s/p distal gastrectomy now with ongoing distention and difficulty eating, suspect acute component on top of chronic malnutrition) Signs/Symptoms: severe fat depletion, severe muscle depletion, percent weight loss Percent weight loss: 13.5 % (over the past month) Interventions: Refer to RD note for recommendations     TRH will sign off at this time     Subjective: He is feeling well. Eager to go home when possible.   Objective: Vitals:   07/04/23 0535 07/04/23 0813  BP: 107/72 99/62  Pulse: 75 72  Resp: 17 17  Temp: 98.2 F (36.8 C) 98.2 F (36.8 C)  SpO2: 99% 98%    Intake/Output Summary (Last 24 hours) at 07/04/2023 0817 Last data filed at 07/04/2023 0600 Gross per 24 hour  Intake 918 ml  Output 2401 ml  Net -1483 ml   Filed Weights   07/02/23 0500 07/03/23 0500 07/04/23 0535  Weight: 52 kg 53.1 kg 51.8 kg    Exam:  General:  Appears calm and comfortable and is in NAD Eyes:  EOMI, normal lids, iris ENT:  grossly normal hearing, lips & tongue, mmm Neck:  no LAD, masses or thyromegaly Cardiovascular:  RR with mild tachycardia, no m/r/g. No LE edema.  Respiratory:   CTA bilaterally with no wheezes/rales/rhonchi.  Normal respiratory effort. Abdomen:  soft, thin, multiple surgical scars as well as drain in place Skin:  no rash or induration seen on limited exam Musculoskeletal:  grossly normal tone BUE/BLE, good ROM, no bony abnormality Psychiatric:  grossly normal mood and affect,  speech fluent and appropriate, AOx3 Neurologic:  CN 2-12 grossly intact, moves all extremities in coordinated fashion  Data Reviewed: I have reviewed the patient's lab results since admission.  Pertinent labs for today include:  Na++ 136 Hgb 8.2 Platelets 907   Family Communication: None present   Time spent: 35 minutes  Author: Jonah Blue, MD 07/04/2023 8:17 AM  For on call review  [email or web address].

## 2023-07-05 LAB — GLUCOSE, CAPILLARY
Glucose-Capillary: 110 mg/dL — ABNORMAL HIGH (ref 70–99)
Glucose-Capillary: 111 mg/dL — ABNORMAL HIGH (ref 70–99)
Glucose-Capillary: 116 mg/dL — ABNORMAL HIGH (ref 70–99)
Glucose-Capillary: 86 mg/dL (ref 70–99)
Glucose-Capillary: 95 mg/dL (ref 70–99)
Glucose-Capillary: 97 mg/dL (ref 70–99)

## 2023-07-05 LAB — BASIC METABOLIC PANEL
Anion gap: 5 (ref 5–15)
BUN: 12 mg/dL (ref 6–20)
CO2: 25 mmol/L (ref 22–32)
Calcium: 8.7 mg/dL — ABNORMAL LOW (ref 8.9–10.3)
Chloride: 102 mmol/L (ref 98–111)
Creatinine, Ser: 0.53 mg/dL — ABNORMAL LOW (ref 0.61–1.24)
GFR, Estimated: >60 mL/min (ref >60)
Glucose, Bld: 108 mg/dL — ABNORMAL HIGH (ref 70–99)
Potassium: 4.8 mmol/L (ref 3.5–5.1)
Sodium: 132 mmol/L — ABNORMAL LOW (ref 135–145)

## 2023-07-05 LAB — CULTURE, BLOOD (ROUTINE X 2)

## 2023-07-05 MED ORDER — HYDROMORPHONE HCL 1 MG/ML IJ SOLN
0.5000 mg | Freq: Three times a day (TID) | INTRAMUSCULAR | Status: DC | PRN
  Administered 2023-07-06: 1 mg via INTRAVENOUS
  Filled 2023-07-05 (×2): qty 1

## 2023-07-05 MED ORDER — TRAVASOL 10 % IV SOLN
INTRAVENOUS | Status: AC
  Filled 2023-07-05: qty 900

## 2023-07-05 NOTE — Progress Notes (Signed)
PHARMACY - TOTAL PARENTERAL NUTRITION CONSULT NOTE   Indication:  severe protein calorie malnutrition and delayed gastric emptying  Patient Measurements: Weight: 51.8 kg (114 lb 4.8 oz)   Body mass index is 17.38 kg/m. 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 Patient was started on CLD 7/5 and tolerating soft diet 7/9. Patient had BM 7/11 and was discharged. At home, patient had increasing abdominal pain and distention with poor PO intake. Patient had 15 lb weight loss in 2.5 months. Gtube to gravity with frequent clogging per patient.  Pharmacy consulted for TPN.  Glucose / Insulin: no hx DM - CBGs 108-116 on TPN, no insulin Electrolytes: K 4.4 > 4.8 (s/p KCl), Na 136 > 132, CoCa 9.6, Phos 3.2, Mg 2, Cl 104, CO2 23 Renal: Scr 0.57, BUN 8 - stable Hepatic: LFTs wnl, lipase 115, alk phos wnl, Tbili wnl Intake / Output; MIVF: G tube to gravity - 1500 mL output - Reglan 10 q6h IV  GI Imaging: 7/16 CTAP: chronic obstruction GI Surgeries / Procedures:  7/3 distal gastrectomy and G-tube placement 7/3. Patient was started on CLD 7/5 and tolerating soft diet 7/9. Patient had BM 7/11 and was discharged.  Central access: PICC ordered 7/16 TPN start date: 7/17  Nutritional Goals: Goal TPN rate is 75 mL/hr which will provide 90g AA and 1756 kcal per day meeting 100% of needs  RD Assessment:  Estimated Needs Total Energy Estimated Needs: 1700-1900 Total Protein Estimated Needs: 85-95 grams Total Fluid Estimated Needs: >/= 1.8 L/day  Current Nutrition:  NPO, TPN to start 7/17  Plan:  Cycle TPN over 16 hours at goal rate 60-120 mL/hr starting at 1800 to meet 100% of estimated needs Electrolytes in TPN: increase Na to 30 mEq/L, decrease K to 50 mEq/L, Ca 30mEq/L, Mg to 70mEq/L, Phos 15 mmol/L. Cl:Ac 1:1 Add standard  MVI and trace elements to TPN Continue Sensitive q6h SSI and adjust as needed  Monitor TPN labs on Mon/Thurs, daily labs until TPN at goal rate Thiamine 100mg  x 7 days per d/w RD (day 5)  Confirmed with Surgery, current plan is to send home with TPN, starting to cycle today and continue to increase cycle duration as able pending toleration over the next few days  Thank you for allowing pharmacy to be a part of this patient's care.  Thelma Barge, PharmD Clinical Pharmacist

## 2023-07-05 NOTE — Progress Notes (Signed)
Subjective/Chief Complaint: Back pain from bed/chair.   Objective: Vital signs in last 24 hours: Temp:  [98 F (36.7 C)-99.3 F (37.4 C)] 99.3 F (37.4 C) (07/21 0831) Pulse Rate:  [72-105] 105 (07/21 0831) Resp:  [16-18] 16 (07/21 0831) BP: (105-130)/(53-82) 106/70 (07/21 0831) SpO2:  [97 %-100 %] 100 % (07/21 0831) Last BM Date : 07/04/23  Intake/Output from previous day: 07/20 0701 - 07/21 0700 In: 810.7 [I.V.:780.7] Out: 1500  Intake/Output this shift: No intake/output data recorded.  General appearance: alert, cooperative, and no distress Eyes: sunken eyes improved.  Resp: breathing comfortably GI: soft, distended upper abdomen, but less. G tube in place Extremities: extremities normal, atraumatic, no cyanosis or edema  Lab Results:  Recent Labs    07/03/23 0425 07/04/23 0255  WBC 9.6 10.4  HGB 6.4* 8.2*  HCT 21.9* 27.9*  PLT 863* 907*   BMET Recent Labs    07/04/23 0255 07/05/23 0342  NA 136 132*  K 4.4 4.8  CL 104 102  CO2 23 25  GLUCOSE 95 108*  BUN 7 12  CREATININE 0.57* 0.53*  CALCIUM 8.5* 8.7*   PT/INR No results for input(s): "LABPROT", "INR" in the last 72 hours.  ABG No results for input(s): "PHART", "HCO3" in the last 72 hours.  Invalid input(s): "PCO2", "PO2"  Studies/Results: No results found.  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     Delayed gastric emptying/gastroparesis from chronic obstruction Dehydration Severe protein calorie malnutrition Acute kidney injury Urinary retention Hypochloremia and hyponatremia Hypokalemia ABL anemia with chronic anemia of chronic disease Thrombocytosis  CT showed expected changes with distended stomach with liquid and food material as well as  constipation.  No evidence of abscess Picc and start TPN- cycle.  Bowel regimen Recheck Na, Cl, K tomorrow on labs.  ABL and CBL anemia - recheck tomorrow.   Enoxaparin for VTE ppx.    Dispo: inpatient. Plan home tomorrow.  TPN G tube clamp 2 hours, then gravity 4 hours.  Repeat.  Advised patient to time taking medications with the G tube clamped. Decreased IV dilaudid.       LOS: 5 days    Almond Lint 07/05/2023

## 2023-07-05 NOTE — Plan of Care (Signed)

## 2023-07-06 LAB — COMPREHENSIVE METABOLIC PANEL
ALT: 21 U/L (ref 0–44)
AST: 29 U/L (ref 15–41)
Albumin: 2.8 g/dL — ABNORMAL LOW (ref 3.5–5.0)
Alkaline Phosphatase: 104 U/L (ref 38–126)
Anion gap: 10 (ref 5–15)
BUN: 16 mg/dL (ref 6–20)
CO2: 26 mmol/L (ref 22–32)
Calcium: 9 mg/dL (ref 8.9–10.3)
Chloride: 98 mmol/L (ref 98–111)
Creatinine, Ser: 0.55 mg/dL — ABNORMAL LOW (ref 0.61–1.24)
GFR, Estimated: >60 mL/min (ref >60)
Glucose, Bld: 97 mg/dL (ref 70–99)
Potassium: 4.7 mmol/L (ref 3.5–5.1)
Sodium: 134 mmol/L — ABNORMAL LOW (ref 135–145)
Total Bilirubin: 0.4 mg/dL (ref 0.3–1.2)
Total Protein: 6.9 g/dL (ref 6.5–8.1)

## 2023-07-06 LAB — CBC
HCT: 28.2 % — ABNORMAL LOW (ref 39.0–52.0)
Hemoglobin: 8.4 g/dL — ABNORMAL LOW (ref 13.0–17.0)
MCH: 24.1 pg — ABNORMAL LOW (ref 26.0–34.0)
MCHC: 29.8 g/dL — ABNORMAL LOW (ref 30.0–36.0)
MCV: 80.8 fL (ref 80.0–100.0)
Platelets: 885 10*3/uL — ABNORMAL HIGH (ref 150–400)
RBC: 3.49 MIL/uL — ABNORMAL LOW (ref 4.22–5.81)
RDW: 19.9 % — ABNORMAL HIGH (ref 11.5–15.5)
WBC: 11.5 10*3/uL — ABNORMAL HIGH (ref 4.0–10.5)
nRBC: 0 % (ref 0.0–0.2)

## 2023-07-06 LAB — GLUCOSE, CAPILLARY
Glucose-Capillary: 117 mg/dL — ABNORMAL HIGH (ref 70–99)
Glucose-Capillary: 106 mg/dL — ABNORMAL HIGH (ref 70–99)
Glucose-Capillary: 100 mg/dL — ABNORMAL HIGH (ref 70–99)
Glucose-Capillary: 84 mg/dL (ref 70–99)
Glucose-Capillary: 112 mg/dL — ABNORMAL HIGH (ref 70–99)

## 2023-07-06 LAB — TRIGLYCERIDES: Triglycerides: 61 mg/dL (ref <150)

## 2023-07-06 LAB — MAGNESIUM: Magnesium: 2 mg/dL (ref 1.7–2.4)

## 2023-07-06 LAB — PHOSPHORUS: Phosphorus: 4.6 mg/dL (ref 2.5–4.6)

## 2023-07-06 MED ORDER — TRAVASOL 10 % IV SOLN
INTRAVENOUS | Status: AC
  Filled 2023-07-06: qty 900

## 2023-07-06 NOTE — TOC Progression Note (Addendum)
Transition of Care [facility]) - Progression Note    Patient Details  Name: Alan Jennings MRN: [id number] Date of Birth: September 28, 1981  [facility]) CM/SW Contact  Nadene Rubins Adria Devon, RN Phone Number: 07/06/2023, 8:16 AM  Clinical Narrative:      Per progress note , plan discharge home today.   Otelia Santee with [facility] aware and checking on availability of nursing.   Will need home TPN order  and phone number for inpatient RD in case their pharmacist needs clarifications    RD is Leanne desk phone [phone] pager [phone]   Rachel with [facility] now has TPN orders   She can provide 3 RN visits at home to start patient on TPN, however She has tried Therapist, art , [facility] , [place], [address], [address], [place], [place], [place] , [place] , [place], [place] and cannot find a accepting agency.   She cannot use [facility] or [facility], because his insurance does not allow them to bill for nursing for TPN.   [facility] is working on finding him a place to go to once a week for [employer] care and labs.   Patient aware of all of above.   Patient lives in [place] , prefers [place] over [place] . [place] aware    [id number] [facility] unable to staff a nurse for this evening. [facility] has called [facility] x 2 to arrange OP PICC line and labs and waiting for call back.   MD, [facility] and nurse and [facility] aware   [phone] [facility] is arranging for patient to go to [facility] weekly for labs , she is asking for MD to write a nursing order to state weekly PICC line care and dressing change and detail what labs they want drawn . Texted pharmacy and DR [name]  1500 Progress note with PICC care and labs etc emailed to [facility] with [facility] , she will send to [facility]. [facility] has paper order sheet for Dr [name] to see. Texted Dr [name]   Expected Discharge Plan: Home/Self Care Barriers to Discharge: Continued Medical Work up  Expected Discharge Plan and Services   Discharge Planning Services: CM Consult   Living  arrangements for the past 2 months: Single Family Home                 DME Arranged: N/A DME Agency: NA       HH Arranged: NA           Social Determinants of Health (SDOH) Interventions SDOH Screenings   Food Insecurity: No Food Insecurity (06/17/2023)  Housing: Low Risk  (07/01/2023)  Recent Concern: Housing - Medium Risk (06/17/2023)  Transportation Needs: No Transportation Needs (06/17/2023)  Utilities: Not At Risk (06/17/2023)  Tobacco Use: Medium Risk (06/30/2023)    Readmission Risk Interventions    07/01/2023   10:40 AM 05/21/2023   11:44 AM  Readmission Risk Prevention Plan  Transportation Screening Complete Complete  PCP or Specialist Appt within 5-7 Days  Complete  PCP or Specialist Appt within 3-5 Days Complete   Home Care Screening  Complete  Medication Review (RN CM)  Complete  HRI or Home Care Consult Complete   Social Work Consult for Recovery Care Planning/Counseling Complete   Medication Review [facility]) Referral to Pharmacy

## 2023-07-06 NOTE — Progress Notes (Signed)
TPN rate changed to 120 ml per orders.

## 2023-07-06 NOTE — Progress Notes (Addendum)
G-tube unclamped, open to gravity for 4 hours. Please see flowsheet.   Dressing changed completed, tolerated well. Non adherent gauze applied with dry dressing and medipore tape.

## 2023-07-06 NOTE — Progress Notes (Signed)
GT clamped

## 2023-07-06 NOTE — Progress Notes (Signed)
Subjective/Chief Complaint: Doing well.  Electrolytes improving.  Doing clamping trials.    Objective: Vital signs in last 24 hours: Temp:  [98.3 F (36.8 C)-98.8 F (37.1 C)] 98.3 F (36.8 C) (07/22 0815) Pulse Rate:  [77-97] 91 (07/22 0815) Resp:  [16-18] 16 (07/22 0515) BP: (109-139)/(71-88) 109/71 (07/22 0815) SpO2:  [98 %-100 %] 100 % (07/22 0815) Last BM Date : 07/04/23  Intake/Output from previous day: 07/21 0701 - 07/22 0700 In: 1398.5 [I.V.:1398.5] Out: 2000 [Drains:2000] Intake/Output this shift: Total I/O In: 250 [P.O.:240; I.V.:10] Out: -   General appearance: alert, cooperative, and no distress Resp: breathing comfortably GI: soft, non distended, non tender. G tube in place Extremities: extremities normal, atraumatic, no cyanosis or edema  Lab Results:  Recent Labs    07/04/23 0255 07/06/23 0309  WBC 10.4 11.5*  HGB 8.2* 8.4*  HCT 27.9* 28.2*  PLT 907* 885*   BMET Recent Labs    07/05/23 0342 07/06/23 0309  NA 132* 134*  K 4.8 4.7  CL 102 98  CO2 25 26  GLUCOSE 108* 97  BUN 12 16  CREATININE 0.53* 0.55*  CALCIUM 8.7* 9.0   PT/INR No results for input(s): "LABPROT", "INR" in the last 72 hours.  ABG No results for input(s): "PHART", "HCO3" in the last 72 hours.  Invalid input(s): "PCO2", "PO2"  Studies/Results: No results found.  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     Delayed gastric emptying/gastroparesis from chronic obstruction Dehydration Severe protein calorie malnutrition Acute kidney injury Urinary retention Hypochloremia and hyponatremia Hypokalemia ABL anemia with chronic anemia of chronic disease Thrombocytosis  TPN- cycle.  Bowel regimen ABL and CBL anemia -  stable Enoxaparin for VTE ppx.    Dispo: inpatient. Plan home tomorrow, HH not available today.  TPN G tube clamp 2 hours, then gravity 4 hours.  Repeat.  Advised patient to time taking medications with the G tube clamped. This will be a very slow process given the length of time he has had gastric outlet obstruction pre op.       LOS: 6 days    Almond Lint 07/06/2023

## 2023-07-06 NOTE — Progress Notes (Signed)
PHARMACY - TOTAL PARENTERAL NUTRITION CONSULT NOTE  Indication:  Severe PCM and delayed gastric emptying  Patient Measurements: Weight: 51.8 kg (114 lb 4.8 oz)   Body mass index is 17.38 kg/m. 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 Patient was started on CLD 7/5 and tolerating soft diet 7/9. Patient had BM 7/11 and was discharged. At home, patient had increasing abdominal pain and distention with poor PO intake. Patient had 15 lb weight loss in 2.5 months. Gtube to gravity with frequent clogging per patient.  Pharmacy consulted for TPN.  Glucose / Insulin: no hx DM - CBGs < 180 on and off TPN.  No SSI usage. Electrolytes: low Na, others WNL (K and Phos high normal) Renal: SCr < 1, BUN WNL Hepatic: LFTs / tbili / TG WNL, lipase 115, albumin 2.8 Intake / Output; MIVF: G tube to gravity - output, LBM 7/20 GI Imaging: 7/16 CTAP: chronic obstruction GI Surgeries / Procedures:  7/3 distal gastrectomy and G-tube placement 7/3.   Central access: PICC ordered 06/30/23 TPN start date: 07/01/23  Nutritional Goals: Goal TPN rate is 75 mL/hr which will provide 90g AA and 1756 kcal per day meeting 100% of needs  RD Estimated Needs Total Energy Estimated Needs: 1700-1900 Total Protein Estimated Needs: 85-95 grams Total Fluid Estimated Needs: >/= 1.8 L/day  Current Nutrition:  TPN - being cycling TPN 07/04/23 Bariatric CLD started 7/19  Plan:  Continue cyclic TPN over 16 hours (60-120 mL/hr, GIR 2.7-5.4 mg/kg/min) TPN provides 90g AA and 1757 kCal, meeting 100% of needs Electrolytes in TPN: increase Na to 50 mEq/L, decrease K further to 40 mEq/L, Ca 37mEq/L, Mg 56mEq/L, reduce Phos to 10 mmol/L. Cl:Ac 1:1 for now Add standard MVI and trace elements to TPN Continue sensitive SSI Q6H Remove thiamine as patient is at  goal TPN rate Monitor TPN labs on Mon/Thurs Monitor lytes and reduce free water in TPN if necessary  Informed patient is being discharged home today.  Contacted staff at [employer] to relay monitoring recommendations.  Penny Frisbie D. Laney Potash, PharmD, BCPS, BCCCP 07/06/2023, 8:40 AM

## 2023-07-06 NOTE — Progress Notes (Signed)
G-Tube unclamped  open to gravity for 4 hours.

## 2023-07-06 NOTE — Progress Notes (Signed)
TPN rate changed and g tube clamped at this time.

## 2023-07-06 NOTE — Progress Notes (Signed)
TPN orders:  Plan:  Continue cyclic TPN over 16 hours (60-120 mL/hr, GIR 2.7-5.4 mg/kg/min) TPN provides 90g AA and 1757 kCal, meeting 100% of needs Electrolytes in TPN: Na 50 mEq/L, decrease K 40 mEq/L, Ca 80mEq/L, Mg 73mEq/L, Phos to 10 mmol/L. Cl:Ac 1:1 for now Add standard MVI and trace elements to TPN Continue sensitive SSI Q6H Monitor TPN labs on Mon/Thurs Monitor lytes and reduce free water in TPN if necessary  TPN labs CMET, Mag, Phos, Triglycerides, Monday/Thursday and CBC weekly   Nursing orders: Weekly PICC care/dressing change per protocol.

## 2023-07-07 ENCOUNTER — Other Ambulatory Visit (HOSPITAL_COMMUNITY): Payer: Self-pay

## 2023-07-07 ENCOUNTER — Other Ambulatory Visit: Payer: Self-pay

## 2023-07-07 DIAGNOSIS — Z452 Encounter for adjustment and management of vascular access device: Secondary | ICD-10-CM

## 2023-07-07 LAB — BASIC METABOLIC PANEL
Anion gap: 10 (ref 5–15)
BUN: 14 mg/dL (ref 6–20)
CO2: 25 mmol/L (ref 22–32)
Calcium: 8.9 mg/dL (ref 8.9–10.3)
Chloride: 98 mmol/L (ref 98–111)
Creatinine, Ser: 0.53 mg/dL — ABNORMAL LOW (ref 0.61–1.24)
GFR, Estimated: >60 mL/min (ref >60)
Glucose, Bld: 94 mg/dL (ref 70–99)
Potassium: 4.3 mmol/L (ref 3.5–5.1)
Sodium: 133 mmol/L — ABNORMAL LOW (ref 135–145)

## 2023-07-07 LAB — GLUCOSE, CAPILLARY
Glucose-Capillary: 123 mg/dL — ABNORMAL HIGH (ref 70–99)
Glucose-Capillary: 123 mg/dL — ABNORMAL HIGH (ref 70–99)
Glucose-Capillary: 128 mg/dL — ABNORMAL HIGH (ref 70–99)
Glucose-Capillary: 86 mg/dL (ref 70–99)

## 2023-07-07 LAB — PHOSPHORUS: Phosphorus: 4.5 mg/dL (ref 2.5–4.6)

## 2023-07-07 MED ORDER — TRAVASOL 10 % IV SOLN
INTRAVENOUS | Status: DC
  Filled 2023-07-07: qty 903.31

## 2023-07-07 MED ORDER — METHOCARBAMOL 500 MG PO TABS
500.0000 mg | ORAL_TABLET | Freq: Three times a day (TID) | ORAL | 1 refills | Status: DC | PRN
  Filled 2023-07-07: qty 20, 7d supply, fill #0
  Filled 2023-07-18 (×2): qty 20, 7d supply, fill #1

## 2023-07-07 MED ORDER — INSULIN ASPART 100 UNIT/ML IJ SOLN
0.0000 [IU] | INTRAMUSCULAR | Status: DC
  Administered 2023-07-07: 1 [IU] via SUBCUTANEOUS

## 2023-07-07 MED ORDER — HYDROMORPHONE HCL 2 MG PO TABS
2.0000 mg | ORAL_TABLET | ORAL | 0 refills | Status: DC | PRN
  Filled 2023-07-07 (×2): qty 60, 5d supply, fill #0

## 2023-07-07 MED ORDER — QUETIAPINE FUMARATE 25 MG PO TABS
25.0000 mg | ORAL_TABLET | Freq: Every day | ORAL | 0 refills | Status: DC | PRN
  Filled 2023-07-07: qty 30, 30d supply, fill #0

## 2023-07-07 MED ORDER — ONDANSETRON 4 MG PO TBDP
4.0000 mg | ORAL_TABLET | Freq: Four times a day (QID) | ORAL | 0 refills | Status: DC | PRN
  Filled 2023-07-07: qty 20, 5d supply, fill #0

## 2023-07-07 MED ORDER — TRAVASOL 10 % IV SOLN: INTRAVENOUS | Status: DC

## 2023-07-07 MED ORDER — PRAZOSIN HCL 2 MG PO CAPS
2.0000 mg | ORAL_CAPSULE | Freq: Every day | ORAL | 0 refills | Status: DC
  Filled 2023-07-07: qty 30, 30d supply, fill #0

## 2023-07-07 MED ORDER — SENNA 8.6 MG PO TABS
1.0000 | ORAL_TABLET | Freq: Two times a day (BID) | ORAL | 3 refills | Status: DC
  Filled 2023-07-07: qty 60, 30d supply, fill #0

## 2023-07-07 MED ORDER — LAMOTRIGINE 200 MG PO TABS
200.0000 mg | ORAL_TABLET | Freq: Every day | ORAL | 0 refills | Status: DC
  Filled 2023-07-07: qty 90, 90d supply, fill #0

## 2023-07-07 MED ORDER — HEPARIN SOD (PORK) LOCK FLUSH 100 UNIT/ML IV SOLN
250.0000 [IU] | INTRAVENOUS | Status: AC | PRN
  Administered 2023-07-07: 250 [IU]

## 2023-07-07 MED ORDER — BUSPIRONE HCL 30 MG PO TABS
30.0000 mg | ORAL_TABLET | Freq: Two times a day (BID) | ORAL | 0 refills | Status: DC
  Filled 2023-07-07: qty 180, 90d supply, fill #0

## 2023-07-07 MED ORDER — POLYETHYLENE GLYCOL 3350 17 G PO PACK
17.0000 g | PACK | Freq: Every day | ORAL | 0 refills | Status: DC
  Filled 2023-07-07: qty 14, 14d supply, fill #0

## 2023-07-07 MED ORDER — BUPRENORPHINE HCL 8 MG SL SUBL
8.0000 mg | SUBLINGUAL_TABLET | Freq: Three times a day (TID) | SUBLINGUAL | 0 refills | Status: DC
  Filled 2023-07-07: qty 90, 30d supply, fill #0

## 2023-07-07 NOTE — Progress Notes (Signed)
G tube clamped.

## 2023-07-07 NOTE — Discharge Summary (Signed)
Physician Discharge Summary  Patient ID: CASHAWN YANKO MRN: [id number] DOB/AGE: Jan 04, 1981 42 y.o.  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  He came to the emergency department with weight loss, abdominal distention, inability to eat, and issues with urination.  He had been opening his G-tube to gravity but noted that it was frequently getting clogged.  He also was trying to take Ensure and Pedialyte, but was not able to keep adequate nutrition down.  He became extremely weak.  Upon admission, his electrolytes were severely deranged.  Sodium was 129 on admission and his chloride was 85.  Potassium was 3.  A PICC line was placed and TPN was started.  There were issues getting his G-tube to gravity which were addressed with nursing.  His pain improved.  He did start to feel better and his TPN was cycled.  The patient started very slow Clamping trials of the G-tube.  This was mainly just to provide a few hours for oral medications to get digested and absorbed.  He is sent home with TPN and small amounts of clears.  He is instructed to continue 2 hours of clamping and 4 hours of G-tube open to gravity for several more weeks.  We would then change this to 4 hours clamped and 2 hours off.  His electrolytes did improve significantly and his kidney function returned to normal. .  He was discharged home with home health with TPN and G-tube  intermittent clamping and stable condition.  Consults:  internal medicine  Significant Diagnostic Studies: labs: Na 133, K 4.3, Cl 98, Cr 0.53 prior to d/c.   Treatments: TPN and open G tube to gravity  Discharge Exam: Blood pressure 115/66, pulse 97, temperature 97.8 F (36.6 C), resp. rate 15, weight 51.1 kg, SpO2 100%. General appearance: alert, cooperative, and no distress Resp: breathing comfortably GI: soft, non tender, g tube wound good, non distended.  Extremities: extremities normal, atraumatic, no cyanosis or edema  Disposition: Discharge disposition: 01-Home or Self Care       Discharge Instructions     Call MD for:  difficulty breathing, headache or visual disturbances   Complete by: As directed    Call MD for:  hives   Complete by: As directed    Call MD for:  persistant nausea and vomiting   Complete by: As directed    Call MD for:  redness, tenderness, or signs of infection (pain, swelling, redness, odor or green/yellow discharge around incision site)   Complete by: As directed    Call MD for:  severe uncontrolled pain   Complete by: As directed    Call MD for:  temperature >100.4   Complete by: As directed    Change dressing (specify)   Complete by: As directed    G tube care.  Change dressing PRN.   Diet clear liquid   Complete by: As directed    Home Health   Complete by: As directed    TPN per protocol   To provide the following care/treatments: Home Health Aide  Increase activity slowly   Complete by: As directed       Allergies as of 07/07/2023       Reactions   Tramadol Other (See Comments)   SEIZURES   Nsaids Other (See Comments)   Has caused ulcers and needs to avoid        Medication List     STOP taking these medications    buprenorphine 8 MG Subl SL tablet Commonly known as: SUBUTEX   busPIRone 30 MG tablet Commonly known as: BUSPAR   Emetrol 230 MG Chew Generic drug: Sodium Citrate Dihydrate   prazosin 2 MG  capsule Commonly known as: MINIPRESS       TAKE these medications    acetaminophen 500 MG tablet Commonly known as: TYLENOL Take 500-1,000 mg by mouth every 6 (six) hours as needed (for headaches).   calcium carbonate 500 MG chewable tablet Commonly known as: TUMS - dosed in mg elemental calcium Chew 1 tablet by mouth daily as needed for indigestion or heartburn.   HYDROmorphone 2 MG tablet Commonly known as: DILAUDID Take 1-2 tablets (2-4 mg total) by mouth every 4 (four) hours as needed for moderate pain or severe pain.   lamoTRIgine 200 MG tablet Commonly known as: LAMICTAL Take 1 tablet (200 mg total) by mouth daily. What changed: when to take this   LORazepam 0.5 MG tablet Commonly known as: ATIVAN Take 1 tablet (0.5 mg total) by mouth daily as needed for severe anxiety (must last 30 days per md) What changed: reasons to take this   methocarbamol 500 MG tablet Commonly known as: ROBAXIN Take 1 tablet (500 mg total) by mouth every 8 (eight) hours as needed for muscle spasms.   ondansetron 4 MG disintegrating tablet Commonly known as: ZOFRAN-ODT Dissolve 1 tablet in mouth every 6 (six) hours as needed for nausea.   pantoprazole 40 MG tablet Commonly known as: Protonix Take 1 tablet (40 mg total) by mouth daily.   polyethylene glycol 17 g packet Commonly known as: MIRALAX / GLYCOLAX Take 17 g by mouth daily.   promethazine 12.5 MG tablet Commonly known as: PHENERGAN Take 1 tablet (12.5 mg total) by mouth every 6 (six) hours as needed for nausea or vomiting.   QUEtiapine 25 MG tablet Commonly known as: SEROquel Take 1 tablet (25 mg) by mouth daily as needed for insomnia What changed: reasons to take this   senna 8.6 MG Tabs tablet Commonly known as: SENOKOT Take 1 tablet (8.6 mg total) by mouth 2 (two) times daily.   sucralfate 1 g tablet Commonly known as: Carafate Take 1 tablet (1 g total) by mouth 4 (four) times daily.               Discharge  Care Instructions  (From admission, onward)           Start     Ordered   07/07/23 0000  Change dressing (specify)       Comments: G tube care.  Change dressing PRN.   07/07/23 0901            Follow-up Information     Vertis Kelch, NP In 1 week.   Specialty: Nurse Practitioner Contact information: [address] [place] [address] [phone]         Almond Lint, MD Follow up in 2 week(s).   Specialty: General Surgery Contact information: [address] [place] [address] [phone]  Signed: Almond Lint 07/09/2023, 1:09 PM

## 2023-07-07 NOTE — Progress Notes (Signed)
PHARMACY - TOTAL PARENTERAL NUTRITION CONSULT NOTE  Indication:  Severe PCM and delayed gastric emptying  Patient Measurements: Weight: 51.1 kg (112 lb 10.5 oz)   Body mass index is 17.13 kg/m. 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 Patient was started on CLD 7/5 and tolerating soft diet 7/9. Patient had BM 7/11 and was discharged. At home, patient had increasing abdominal pain and distention with poor PO intake. Patient had 15 lb weight loss in 2.5 months. Gtube to gravity with frequent clogging per patient.  Pharmacy consulted for TPN.  Glucose / Insulin: no hx DM - CBGs < 180 on and off TPN.  No SSI usage. Electrolytes: Na 133, others WNL (Phos high normal) Renal: SCr < 1, BUN WNL Hepatic: LFTs / tbili / TG WNL, lipase 115, albumin 2.8 Intake / Output; MIVF: G tube to gravity - output, LBM 7/20 GI Imaging: 7/16 CTAP: chronic obstruction GI Surgeries / Procedures:  7/3 distal gastrectomy and G-tube placement 7/3.   Central access: PICC ordered 06/30/23 TPN start date: 07/01/23  Nutritional Goals: Goal TPN rate is 75 mL/hr which will provide 90g AA and 1756 kcal per day meeting 100% of needs  RD Estimated Needs Total Energy Estimated Needs: 1700-1900 Total Protein Estimated Needs: 85-95 grams Total Fluid Estimated Needs: >/= 1.8 L/day  Current Nutrition:  TPN - being cycling TPN 07/04/23 Bariatric CLD started 7/19  Plan:  Adjust TPN and reduce cyclic TPN to infuse over 14 hours (67-135 mL/hr, GIR 3.2-6.4mg /kg/min) TPN provides 90g AA and 1726 kCal, meeting 100% of needs Electrolytes in TPN: increase Na to 100 mEq/L, K 42 mEq/L, Ca 57mEq/L, Mg 27mEq/L, reduce Phos further to 43mmol/L. Cl:Ac 1:1 Add standard MVI and trace elements to TPN Continue sensitive SSI Q6H - adjust to account for cyclic TPN  hours Monitor TPN labs on Mon/Thurs  Discharge planning for today 7/23.  Contacted staff at [facility] again to relay TPN formula and monitoring recommendations.  Torrey Horseman D. Laney Potash, PharmD, BCPS, BCCCP 07/07/2023, 8:28 AM

## 2023-07-07 NOTE — Discharge Instructions (Signed)
Clamp G tube 2 hours, then open to gravity 4 hours x 1 week.  You can then move to clamp 3-4 hours and open to gravity 2-3 hours.  Repeat cycle.  Time oral medications such that you can clamp tube prior to taking them.    No heavy lifting or strenuous activity yet.

## 2023-07-07 NOTE — TOC Progression Note (Addendum)
Transition of Care [facility]) - Progression Note    Patient Details  Name: Alan Jennings MRN: [id number] Date of Birth: August 11, 1981  [facility]) CM/SW Contact  Tor Tsuda, Adria Devon, RN Phone Number: 07/07/2023, 10:18 AM  Clinical Narrative:     Rosalita Levan with [facility] early this morning. Awaiting call back regarding if nurse ca see him at home tonight and AP has an appointment for PICC line care and labs   Lavonia with [facility] called they do not have a nurse for tonight , therefore if patient discharges home he will not have TPN. Fleet Contras will call patient. NCM notified Dr [name] and nurse   Fleet Contras with [facility] called back they do have a nurse for tonight between 6 to 7 pm . Patient can discharge today DR [name], nurse , pharmacy aware. Patient aware. AP will call him to arrange appointment time for Thursday. Patient aware   Expected Discharge Plan: Home/Self Care Barriers to Discharge: Continued Medical Work up  Expected Discharge Plan and Services   Discharge Planning Services: CM Consult   Living arrangements for the past 2 months: Single Family Home Expected Discharge Date: 07/07/23               DME Arranged: N/A DME Agency: NA       HH Arranged: NA           Social Determinants of Health (SDOH) Interventions SDOH Screenings   Food Insecurity: No Food Insecurity (06/17/2023)  Housing: Low Risk  (07/01/2023)  Recent Concern: Housing - Medium Risk (06/17/2023)  Transportation Needs: No Transportation Needs (06/17/2023)  Utilities: Not At Risk (06/17/2023)  Tobacco Use: Medium Risk (06/30/2023)    Readmission Risk Interventions    07/01/2023   10:40 AM 05/21/2023   11:44 AM  Readmission Risk Prevention Plan  Transportation Screening Complete Complete  PCP or Specialist Appt within 5-7 Days  Complete  PCP or Specialist Appt within 3-5 Days Complete   Home Care Screening  Complete  Medication Review (RN CM)  Complete  HRI or Home Care Consult Complete   Social Work  Consult for Recovery Care Planning/Counseling Complete   Medication Review Oceanographer) Referral to Pharmacy

## 2023-07-07 NOTE — Progress Notes (Signed)
Patient's PICC line heparin locked for discharge. Patient declined to have IV team change the dressing prior to discharge. Dressing change is due Wednesday, 07/07/23. Patient stated he would have the home health agency change dressing.

## 2023-07-07 NOTE — Plan of Care (Signed)

## 2023-07-09 ENCOUNTER — Inpatient Hospital Stay (HOSPITAL_COMMUNITY): Admission: RE | Admit: 2023-07-09 | Payer: Medicaid Other | Source: Ambulatory Visit

## 2023-07-09 ENCOUNTER — Telehealth (HOSPITAL_COMMUNITY): Payer: Self-pay | Admitting: Emergency Medicine

## 2023-07-09 NOTE — Telephone Encounter (Signed)
Per pt and home health nurse, patients PICC care, dressing changes, and labs twice a week will be completed by the home health nurse.  Further appt canceled with infusion clinic.

## 2023-07-13 ENCOUNTER — Encounter (HOSPITAL_COMMUNITY): Payer: Self-pay | Admitting: Gastroenterology

## 2023-07-13 ENCOUNTER — Encounter (HOSPITAL_COMMUNITY): Payer: Medicaid Other

## 2023-07-13 ENCOUNTER — Other Ambulatory Visit (HOSPITAL_COMMUNITY): Payer: Self-pay

## 2023-07-13 MED ORDER — HYDROMORPHONE HCL 2 MG PO TABS
ORAL_TABLET | ORAL | 0 refills | Status: DC
  Filled 2023-07-13: qty 60, 5d supply, fill #0

## 2023-07-14 ENCOUNTER — Encounter: Payer: Self-pay | Admitting: Emergency Medicine

## 2023-07-16 ENCOUNTER — Encounter (HOSPITAL_COMMUNITY): Payer: Medicaid Other

## 2023-07-16 ENCOUNTER — Other Ambulatory Visit: Payer: Self-pay | Admitting: General Surgery

## 2023-07-16 ENCOUNTER — Encounter: Payer: Medicaid Other | Attending: General Surgery

## 2023-07-16 ENCOUNTER — Inpatient Hospital Stay (HOSPITAL_COMMUNITY): Admission: RE | Admit: 2023-07-16 | Payer: Medicaid Other | Source: Ambulatory Visit

## 2023-07-16 ENCOUNTER — Other Ambulatory Visit: Payer: Self-pay

## 2023-07-16 VITALS — BP 103/58 | HR 78 | Temp 98.2°F | Resp 18

## 2023-07-16 DIAGNOSIS — Z452 Encounter for adjustment and management of vascular access device: Secondary | ICD-10-CM | POA: Insufficient documentation

## 2023-07-16 DIAGNOSIS — E43 Unspecified severe protein-calorie malnutrition: Secondary | ICD-10-CM

## 2023-07-16 LAB — TIQ-NTM

## 2023-07-16 MED ORDER — HEPARIN SOD (PORK) LOCK FLUSH 100 UNIT/ML IV SOLN
250.0000 [IU] | Freq: Once | INTRAVENOUS | Status: AC | PRN
  Administered 2023-07-16 (×2): 250 [IU]

## 2023-07-16 MED ORDER — SODIUM CHLORIDE 0.9% FLUSH
10.0000 mL | Freq: Once | INTRAVENOUS | Status: AC | PRN
  Administered 2023-07-16 (×2): 10 mL

## 2023-07-16 NOTE — Progress Notes (Signed)
Labs drawn from Rt arm double lumen PICC line, Flushed easily, good blood return. Both lumens flushed with 10 mls NS and 250 units heparin.

## 2023-07-17 ENCOUNTER — Encounter: Payer: Self-pay | Admitting: Gastroenterology

## 2023-07-18 ENCOUNTER — Other Ambulatory Visit (HOSPITAL_COMMUNITY): Payer: Self-pay

## 2023-07-20 ENCOUNTER — Other Ambulatory Visit: Payer: Self-pay | Admitting: General Surgery

## 2023-07-20 ENCOUNTER — Encounter: Payer: Medicaid Other | Admitting: Emergency Medicine

## 2023-07-20 ENCOUNTER — Encounter (HOSPITAL_COMMUNITY): Payer: Medicaid Other

## 2023-07-20 ENCOUNTER — Ambulatory Visit (HOSPITAL_COMMUNITY): Payer: Medicaid Other

## 2023-07-20 VITALS — BP 101/56 | HR 74 | Temp 98.5°F | Resp 16

## 2023-07-20 DIAGNOSIS — Z452 Encounter for adjustment and management of vascular access device: Secondary | ICD-10-CM

## 2023-07-20 DIAGNOSIS — E43 Unspecified severe protein-calorie malnutrition: Secondary | ICD-10-CM

## 2023-07-20 NOTE — Progress Notes (Signed)
Labs drawn from Rt arm double lumen PICC line, Flushed easily, good blood return. Both lumens flushed with 10 mls NS and 250 units heparin.

## 2023-07-20 NOTE — Progress Notes (Signed)
Alan Jennings presented for PICC line flush and lab draw.  See IV assessment in docflowsheets for PICC details.  PICC located right arm.  Good blood return present. PICC flushed with 20ml NS and 250U Heparin, see MAR for further details.  Alan Jennings tolerated procedure well and without incident.   Labs drawn and dressing changed.

## 2023-07-23 ENCOUNTER — Ambulatory Visit (HOSPITAL_COMMUNITY): Payer: Medicaid Other

## 2023-07-23 ENCOUNTER — Other Ambulatory Visit: Payer: Self-pay | Admitting: General Surgery

## 2023-07-23 ENCOUNTER — Encounter (HOSPITAL_COMMUNITY): Payer: Medicaid Other

## 2023-07-23 ENCOUNTER — Encounter: Payer: Medicaid Other | Admitting: Emergency Medicine

## 2023-07-23 VITALS — BP 112/70 | HR 76 | Temp 98.3°F | Resp 18

## 2023-07-23 DIAGNOSIS — Z452 Encounter for adjustment and management of vascular access device: Secondary | ICD-10-CM | POA: Diagnosis not present

## 2023-07-23 MED ORDER — HEPARIN SOD (PORK) LOCK FLUSH 100 UNIT/ML IV SOLN
250.0000 [IU] | Freq: Once | INTRAVENOUS | Status: AC | PRN
  Administered 2023-07-23: 250 [IU]

## 2023-07-23 MED ORDER — SODIUM CHLORIDE 0.9% FLUSH
10.0000 mL | Freq: Once | INTRAVENOUS | Status: AC | PRN
  Administered 2023-07-23: 10 mL

## 2023-07-23 NOTE — Progress Notes (Signed)
Alan Jennings presented for PICC line flush and lab draw.  See IV assessment in docflowsheets for PICC details.  Proper placement of PICC confirmed by CXR.  PICC located right arm.  Good blood return present. PICC flushed with 20ml NS and 250U Heparin, see MAR for further details.  Alan Jennings tolerated procedure well and without incident.

## 2023-07-27 ENCOUNTER — Encounter: Payer: Self-pay | Admitting: Gastroenterology

## 2023-07-27 ENCOUNTER — Other Ambulatory Visit (HOSPITAL_COMMUNITY): Payer: Self-pay

## 2023-07-27 ENCOUNTER — Other Ambulatory Visit: Payer: Self-pay | Admitting: General Surgery

## 2023-07-27 ENCOUNTER — Encounter: Payer: Medicaid Other | Admitting: *Deleted

## 2023-07-27 ENCOUNTER — Ambulatory Visit (HOSPITAL_COMMUNITY): Payer: Medicaid Other

## 2023-07-27 ENCOUNTER — Encounter (HOSPITAL_COMMUNITY): Payer: Medicaid Other

## 2023-07-27 ENCOUNTER — Other Ambulatory Visit (HOSPITAL_COMMUNITY): Payer: Self-pay | Admitting: General Surgery

## 2023-07-27 VITALS — BP 102/68 | HR 67 | Temp 98.0°F | Resp 18

## 2023-07-27 DIAGNOSIS — E43 Unspecified severe protein-calorie malnutrition: Secondary | ICD-10-CM

## 2023-07-27 DIAGNOSIS — Z452 Encounter for adjustment and management of vascular access device: Secondary | ICD-10-CM

## 2023-07-27 MED ORDER — METOCLOPRAMIDE HCL 10 MG PO TABS
ORAL_TABLET | ORAL | 1 refills | Status: DC
  Filled 2023-07-27: qty 60, 15d supply, fill #0
  Filled 2023-09-19: qty 60, 15d supply, fill #1

## 2023-07-27 MED ORDER — HYDROMORPHONE HCL 2 MG PO TABS
ORAL_TABLET | ORAL | 0 refills | Status: DC
  Filled 2023-07-27: qty 50, 5d supply, fill #0

## 2023-07-27 MED ORDER — SODIUM CHLORIDE 0.9% FLUSH: 10.0000 mL | Freq: Once | INTRAVENOUS | Status: DC | PRN

## 2023-07-27 MED ORDER — METHOCARBAMOL 500 MG PO TABS
ORAL_TABLET | ORAL | 4 refills | Status: DC
  Filled 2023-07-27: qty 30, 10d supply, fill #0
  Filled 2023-08-13: qty 30, 10d supply, fill #1

## 2023-07-27 MED ORDER — HEPARIN SOD (PORK) LOCK FLUSH 100 UNIT/ML IV SOLN
250.0000 [IU] | Freq: Once | INTRAVENOUS | Status: AC | PRN
  Administered 2023-07-27: 250 [IU]

## 2023-07-27 NOTE — Progress Notes (Signed)
PICC flushed and dressing changed. Good blood return from PICC.

## 2023-07-30 ENCOUNTER — Ambulatory Visit (HOSPITAL_COMMUNITY): Payer: Medicaid Other

## 2023-07-30 ENCOUNTER — Encounter (HOSPITAL_COMMUNITY): Payer: Medicaid Other

## 2023-07-30 ENCOUNTER — Encounter: Payer: Medicaid Other | Admitting: *Deleted

## 2023-07-30 VITALS — BP 99/71 | HR 67 | Temp 98.1°F | Resp 18

## 2023-07-30 DIAGNOSIS — E43 Unspecified severe protein-calorie malnutrition: Secondary | ICD-10-CM

## 2023-07-30 DIAGNOSIS — Z452 Encounter for adjustment and management of vascular access device: Secondary | ICD-10-CM

## 2023-07-30 MED ORDER — HEPARIN SOD (PORK) LOCK FLUSH 100 UNIT/ML IV SOLN
500.0000 [IU] | Freq: Once | INTRAVENOUS | Status: AC | PRN
  Administered 2023-07-30: 500 [IU]

## 2023-07-30 NOTE — Progress Notes (Signed)
Diagnosis: PICC Line in Place  Provider: F. [name], MD  Procedure: PICC Dressing Change  Labs Drawn and Line Flushed  Discharge: Condition: Good, Destination: Home . AVS provided to patient.   Performed by:  Daleen Squibb, RN

## 2023-07-31 ENCOUNTER — Ambulatory Visit (HOSPITAL_COMMUNITY)
Admission: RE | Admit: 2023-07-31 | Discharge: 2023-07-31 | Disposition: A | Discharge status: Home / Self Care | Payer: Medicaid Other | Source: Ambulatory Visit | Attending: General Surgery | Admitting: General Surgery

## 2023-07-31 DIAGNOSIS — E43 Unspecified severe protein-calorie malnutrition: Secondary | ICD-10-CM

## 2023-07-31 LAB — COMPREHENSIVE METABOLIC PANEL
AG Ratio: 1 (calc) (ref 1.0–2.5)
ALT: 14 U/L (ref 9–46)
AST: 17 U/L (ref 10–40)
Albumin: 3.9 g/dL (ref 3.6–5.1)
Alkaline phosphatase (APISO): 132 U/L — ABNORMAL HIGH (ref 36–130)
BUN/Creatinine Ratio: 39 (calc) — ABNORMAL HIGH (ref 6–22)
BUN: 22 mg/dL (ref 7–25)
CO2: 24 mmol/L (ref 20–32)
Calcium: 9.6 mg/dL (ref 8.6–10.3)
Chloride: 101 mmol/L (ref 98–110)
Creat: 0.57 mg/dL — ABNORMAL LOW (ref 0.60–1.29)
Globulin: 3.8 g/dL — ABNORMAL HIGH (ref 1.9–3.7)
Glucose, Bld: 76 mg/dL (ref 65–99)
Potassium: 4.8 mmol/L (ref 3.5–5.3)
Sodium: 137 mmol/L (ref 135–146)
Total Bilirubin: 0.3 mg/dL (ref 0.2–1.2)
Total Protein: 7.7 g/dL (ref 6.1–8.1)

## 2023-07-31 LAB — CBC WITH DIFFERENTIAL/PLATELET
Absolute Monocytes: 349 {cells}/uL (ref 200–950)
Basophils Absolute: 87 {cells}/uL (ref 0–200)
Basophils Relative: 0.9 %
Eosinophils Absolute: 49 {cells}/uL (ref 15–500)
Eosinophils Relative: 0.5 %
HCT: 28.2 % — ABNORMAL LOW (ref 38.5–50.0)
Hemoglobin: 8.3 g/dL — ABNORMAL LOW (ref 13.2–17.1)
Lymphs Abs: 2202 {cells}/uL (ref 850–3900)
MCH: 23.1 pg — ABNORMAL LOW (ref 27.0–33.0)
MCHC: 29.4 g/dL — ABNORMAL LOW (ref 32.0–36.0)
MCV: 78.6 fL — ABNORMAL LOW (ref 80.0–100.0)
MPV: 10.8 fL (ref 7.5–12.5)
Monocytes Relative: 3.6 %
Neutro Abs: 7013 {cells}/uL (ref 1500–7800)
Neutrophils Relative %: 72.3 %
Platelets: 569 10*3/uL — ABNORMAL HIGH (ref 140–400)
RBC: 3.59 10*6/uL — ABNORMAL LOW (ref 4.20–5.80)
RDW: 17.7 % — ABNORMAL HIGH (ref 11.0–15.0)
Total Lymphocyte: 22.7 %
WBC: 9.7 10*3/uL (ref 3.8–10.8)

## 2023-07-31 LAB — PHOSPHORUS: Phosphorus: 5.2 mg/dL — ABNORMAL HIGH (ref 2.5–4.5)

## 2023-07-31 LAB — TRIGLYCERIDES: Triglycerides: 84 mg/dL (ref <150)

## 2023-07-31 LAB — MAGNESIUM: Magnesium: 2.2 mg/dL (ref 1.5–2.5)

## 2023-08-03 ENCOUNTER — Ambulatory Visit (HOSPITAL_COMMUNITY): Payer: Medicaid Other

## 2023-08-04 ENCOUNTER — Encounter: Payer: Medicaid Other | Admitting: Emergency Medicine

## 2023-08-04 ENCOUNTER — Other Ambulatory Visit: Payer: Self-pay | Admitting: General Surgery

## 2023-08-04 ENCOUNTER — Encounter: Payer: Self-pay | Admitting: Gastroenterology

## 2023-08-04 ENCOUNTER — Other Ambulatory Visit (HOSPITAL_COMMUNITY): Payer: Self-pay

## 2023-08-04 VITALS — BP 106/73 | HR 112 | Temp 98.5°F | Resp 18

## 2023-08-04 DIAGNOSIS — Z452 Encounter for adjustment and management of vascular access device: Secondary | ICD-10-CM | POA: Diagnosis not present

## 2023-08-04 DIAGNOSIS — E43 Unspecified severe protein-calorie malnutrition: Secondary | ICD-10-CM

## 2023-08-04 MED ORDER — CLONAZEPAM 1 MG PO TBDP
1.0000 mg | ORAL_TABLET | Freq: Every day | ORAL | 0 refills | Status: DC | PRN
  Filled 2023-08-04: qty 15, 30d supply, fill #0

## 2023-08-04 MED ORDER — HEPARIN SOD (PORK) LOCK FLUSH 100 UNIT/ML IV SOLN
250.0000 [IU] | Freq: Once | INTRAVENOUS | Status: AC | PRN
  Administered 2023-08-04: 250 [IU]
  Filled 2023-08-04: qty 5

## 2023-08-04 MED ORDER — SODIUM CHLORIDE 0.9% FLUSH
10.0000 mL | Freq: Once | INTRAVENOUS | Status: AC | PRN
  Administered 2023-08-04: 10 mL

## 2023-08-04 MED ORDER — BUPRENORPHINE HCL 8 MG SL SUBL
8.0000 mg | SUBLINGUAL_TABLET | Freq: Three times a day (TID) | SUBLINGUAL | 0 refills | Status: DC
  Filled 2023-08-04: qty 90, 30d supply, fill #0

## 2023-08-04 NOTE — Progress Notes (Signed)
Alan Jennings presented for PICC line flush in right arm.  See IV assessment in docflowsheets for PICC details.  Proper placement of PICC confirmed by CXR.  PICC located right arm.  Good blood return present. PICC flushed with 20ml NS and 250U Heparin, see MAR for further details.  Alan Jennings tolerated procedure well and without incident.   Pt refused dressing change today, very tired and sore.  Will change dressing with next appt on Friday.

## 2023-08-06 ENCOUNTER — Ambulatory Visit (HOSPITAL_COMMUNITY): Payer: Medicaid Other

## 2023-08-06 LAB — COMPREHENSIVE METABOLIC PANEL
AG Ratio: 1.1 (calc) (ref 1.0–2.5)
ALT: 18 U/L (ref 9–46)
AST: 16 U/L (ref 10–40)
Albumin: 4.2 g/dL (ref 3.6–5.1)
Alkaline phosphatase (APISO): 139 U/L — ABNORMAL HIGH (ref 36–130)
BUN: 21 mg/dL (ref 7–25)
CO2: 25 mmol/L (ref 20–32)
Calcium: 9.6 mg/dL (ref 8.6–10.3)
Chloride: 103 mmol/L (ref 98–110)
Creat: 0.67 mg/dL (ref 0.60–1.29)
Globulin: 4 g/dL — ABNORMAL HIGH (ref 1.9–3.7)
Glucose, Bld: 36 mg/dL — CL (ref 65–99)
Potassium: 4.2 mmol/L (ref 3.5–5.3)
Sodium: 139 mmol/L (ref 135–146)
Total Bilirubin: 0.3 mg/dL (ref 0.2–1.2)
Total Protein: 8.2 g/dL — ABNORMAL HIGH (ref 6.1–8.1)

## 2023-08-06 LAB — PHOSPHORUS: Phosphorus: 4 mg/dL (ref 2.5–4.5)

## 2023-08-06 LAB — TRIGLYCERIDES: Triglycerides: 77 mg/dL (ref <150)

## 2023-08-06 LAB — MAGNESIUM: Magnesium: 2.1 mg/dL (ref 1.5–2.5)

## 2023-08-06 NOTE — Progress Notes (Signed)
[facility] called with a critical lab value that was a glucose of 36. Called patient to let him know and to make sure he had been feeling ok. Patient stated " felt weak yesterday, mom found him passed out but was able to get him up and give him candy and he felt better after that". I told patient that he needed to call his PCP and let them know and see if he could get a order for a glucose monitor. Also told patient when he starts to feel weak to drink juice or eat hard candy or candy bar.

## 2023-08-07 ENCOUNTER — Other Ambulatory Visit: Payer: Self-pay

## 2023-08-07 ENCOUNTER — Other Ambulatory Visit: Payer: Self-pay | Admitting: Internal Medicine

## 2023-08-07 ENCOUNTER — Encounter: Payer: Medicaid Other | Admitting: Internal Medicine

## 2023-08-07 ENCOUNTER — Other Ambulatory Visit (HOSPITAL_COMMUNITY): Payer: Self-pay

## 2023-08-07 ENCOUNTER — Encounter: Payer: Self-pay | Admitting: Gastroenterology

## 2023-08-07 VITALS — BP 109/62 | HR 78 | Temp 98.2°F

## 2023-08-07 DIAGNOSIS — E43 Unspecified severe protein-calorie malnutrition: Secondary | ICD-10-CM

## 2023-08-07 DIAGNOSIS — Z452 Encounter for adjustment and management of vascular access device: Secondary | ICD-10-CM | POA: Diagnosis not present

## 2023-08-07 MED ORDER — HEPARIN SOD (PORK) LOCK FLUSH 100 UNIT/ML IV SOLN
250.0000 [IU] | Freq: Once | INTRAVENOUS | Status: AC | PRN
  Administered 2023-08-07: 250 [IU]

## 2023-08-07 MED ORDER — SODIUM CHLORIDE 0.9% FLUSH
10.0000 mL | Freq: Once | INTRAVENOUS | Status: AC | PRN
  Administered 2023-08-07: 10 mL

## 2023-08-07 NOTE — Progress Notes (Addendum)
Diagnosis: PICC Line in Place  Provider:  Chilton Greathouse, MD  Procedure: PICC Dressing Change, labs drawn, heparin flush    Discharge: Condition: Good, Destination: Home . AVS provided to patient.   Performed by:  Feliberto Harts, LPN

## 2023-08-08 LAB — CBC WITH DIFFERENTIAL/PLATELET
Absolute Monocytes: 541 {cells}/uL (ref 200–950)
Basophils Absolute: 66 {cells}/uL (ref 0–200)
Basophils Relative: 0.5 %
Eosinophils Absolute: 330 cells/uL (ref 15–500)
Eosinophils Relative: 2.5 %
HCT: 27.6 % — ABNORMAL LOW (ref 38.5–50.0)
Hemoglobin: 8.3 g/dL — ABNORMAL LOW (ref 13.2–17.1)
Lymphs Abs: 2191 {cells}/uL (ref 850–3900)
MCH: 22.6 pg — ABNORMAL LOW (ref 27.0–33.0)
MCHC: 30.1 g/dL — ABNORMAL LOW (ref 32.0–36.0)
MCV: 75 fL — ABNORMAL LOW (ref 80.0–100.0)
MPV: 10.5 fL (ref 7.5–12.5)
Monocytes Relative: 4.1 %
Neutro Abs: 10072 {cells}/uL — ABNORMAL HIGH (ref 1500–7800)
Neutrophils Relative %: 76.3 %
Platelets: 409 10*3/uL — ABNORMAL HIGH (ref 140–400)
RBC: 3.68 10*6/uL — ABNORMAL LOW (ref 4.20–5.80)
RDW: 17.2 % — ABNORMAL HIGH (ref 11.0–15.0)
Total Lymphocyte: 16.6 %
WBC: 13.2 10*3/uL — ABNORMAL HIGH (ref 3.8–10.8)

## 2023-08-08 LAB — COMPREHENSIVE METABOLIC PANEL
AG Ratio: 1.1 (calc) (ref 1.0–2.5)
ALT: 19 U/L (ref 9–46)
AST: 20 U/L (ref 10–40)
Albumin: 3.9 g/dL (ref 3.6–5.1)
Alkaline phosphatase (APISO): 125 U/L (ref 36–130)
BUN/Creatinine Ratio: 30 (calc) — ABNORMAL HIGH (ref 6–22)
BUN: 17 mg/dL (ref 7–25)
CO2: 23 mmol/L (ref 20–32)
Calcium: 9.2 mg/dL (ref 8.6–10.3)
Chloride: 101 mmol/L (ref 98–110)
Creat: 0.57 mg/dL — ABNORMAL LOW (ref 0.60–1.29)
Globulin: 3.7 g/dL (ref 1.9–3.7)
Glucose, Bld: 80 mg/dL (ref 65–99)
Potassium: 4.4 mmol/L (ref 3.5–5.3)
Sodium: 135 mmol/L (ref 135–146)
Total Bilirubin: 0.3 mg/dL (ref 0.2–1.2)
Total Protein: 7.6 g/dL (ref 6.1–8.1)

## 2023-08-08 LAB — PHOSPHORUS: Phosphorus: 4.8 mg/dL — ABNORMAL HIGH (ref 2.5–4.5)

## 2023-08-08 LAB — MAGNESIUM: Magnesium: 2 mg/dL (ref 1.5–2.5)

## 2023-08-08 LAB — TRIGLYCERIDES: Triglycerides: 61 mg/dL (ref <150)

## 2023-08-10 ENCOUNTER — Encounter: Payer: Medicaid Other | Admitting: Emergency Medicine

## 2023-08-10 ENCOUNTER — Other Ambulatory Visit: Payer: Self-pay | Admitting: General Surgery

## 2023-08-10 VITALS — BP 113/73 | HR 84 | Temp 98.4°F | Resp 16

## 2023-08-10 DIAGNOSIS — Z452 Encounter for adjustment and management of vascular access device: Secondary | ICD-10-CM | POA: Diagnosis not present

## 2023-08-10 MED ORDER — SODIUM CHLORIDE 0.9% FLUSH
10.0000 mL | Freq: Once | INTRAVENOUS | Status: AC | PRN
  Administered 2023-08-10: 10 mL

## 2023-08-10 MED ORDER — HEPARIN SOD (PORK) LOCK FLUSH 100 UNIT/ML IV SOLN
250.0000 [IU] | Freq: Once | INTRAVENOUS | Status: AC | PRN
  Administered 2023-08-10: 250 [IU]

## 2023-08-10 NOTE — Progress Notes (Signed)
Alan Jennings presented for PICC right arm flush and lab draw.  See IV assessment in docflowsheets for PICC details.  Proper placement of PICC confirmed by CXR.  PICC located right arm.  Good blood return present. PICC flushed with 20ml NS and 250U Heparin, see MAR for further details.  Alan Jennings tolerated procedure well and without incident.

## 2023-08-11 ENCOUNTER — Other Ambulatory Visit (HOSPITAL_COMMUNITY): Payer: Self-pay

## 2023-08-11 ENCOUNTER — Encounter: Payer: Self-pay | Admitting: Gastroenterology

## 2023-08-11 LAB — TRIGLYCERIDES: Triglycerides: 58 mg/dL (ref <150)

## 2023-08-11 LAB — COMPREHENSIVE METABOLIC PANEL
AG Ratio: 0.9 (calc) — ABNORMAL LOW (ref 1.0–2.5)
ALT: 17 U/L (ref 9–46)
AST: 17 U/L (ref 10–40)
Albumin: 3.7 g/dL (ref 3.6–5.1)
Alkaline phosphatase (APISO): 154 U/L — ABNORMAL HIGH (ref 36–130)
BUN/Creatinine Ratio: 31 (calc) — ABNORMAL HIGH (ref 6–22)
BUN: 18 mg/dL (ref 7–25)
CO2: 24 mmol/L (ref 20–32)
Calcium: 9.5 mg/dL (ref 8.6–10.3)
Chloride: 102 mmol/L (ref 98–110)
Creat: 0.59 mg/dL — ABNORMAL LOW (ref 0.60–1.29)
Globulin: 4 g/dL (calc) — ABNORMAL HIGH (ref 1.9–3.7)
Glucose, Bld: 96 mg/dL (ref 65–99)
Potassium: 4.5 mmol/L (ref 3.5–5.3)
Sodium: 137 mmol/L (ref 135–146)
Total Bilirubin: 0.2 mg/dL (ref 0.2–1.2)
Total Protein: 7.7 g/dL (ref 6.1–8.1)

## 2023-08-11 LAB — MAGNESIUM: Magnesium: 1.9 mg/dL (ref 1.5–2.5)

## 2023-08-11 LAB — PHOSPHORUS: Phosphorus: 5.1 mg/dL — ABNORMAL HIGH (ref 2.5–4.5)

## 2023-08-11 MED ORDER — HYDROMORPHONE HCL 2 MG PO TABS
ORAL_TABLET | ORAL | 0 refills | Status: DC
  Filled 2023-08-11: qty 50, 4d supply, fill #0

## 2023-08-13 ENCOUNTER — Encounter: Payer: Self-pay | Admitting: Gastroenterology

## 2023-08-13 ENCOUNTER — Encounter: Payer: Medicaid Other | Admitting: Internal Medicine

## 2023-08-13 ENCOUNTER — Other Ambulatory Visit (HOSPITAL_COMMUNITY): Payer: Self-pay

## 2023-08-13 VITALS — BP 104/63 | HR 73 | Temp 98.2°F | Resp 16

## 2023-08-13 DIAGNOSIS — Z452 Encounter for adjustment and management of vascular access device: Secondary | ICD-10-CM | POA: Diagnosis not present

## 2023-08-13 MED ORDER — HEPARIN SOD (PORK) LOCK FLUSH 100 UNIT/ML IV SOLN
250.0000 [IU] | Freq: Once | INTRAVENOUS | Status: AC | PRN
  Administered 2023-08-13: 250 [IU]

## 2023-08-13 MED ORDER — SODIUM CHLORIDE 0.9% FLUSH
10.0000 mL | Freq: Once | INTRAVENOUS | Status: AC | PRN
  Administered 2023-08-13: 10 mL

## 2023-08-13 NOTE — Progress Notes (Signed)
Diagnosis: PICC Line in Place  Provider:  Chilton Greathouse, MD  Procedure: PICC Dressing Change  Labs Drawn, line flushed,dressing change  Discharge: Condition: Good, Destination: Home . AVS provided to patient.   Performed by:  Feliberto Harts, LPN

## 2023-08-14 LAB — COMPREHENSIVE METABOLIC PANEL
AG Ratio: 1.1 (calc) (ref 1.0–2.5)
ALT: 21 U/L (ref 9–46)
AST: 16 U/L (ref 10–40)
Albumin: 3.9 g/dL (ref 3.6–5.1)
Alkaline phosphatase (APISO): 161 U/L — ABNORMAL HIGH (ref 36–130)
BUN: 20 mg/dL (ref 7–25)
CO2: 24 mmol/L (ref 20–32)
Calcium: 9.6 mg/dL (ref 8.6–10.3)
Chloride: 101 mmol/L (ref 98–110)
Creat: 0.64 mg/dL (ref 0.60–1.29)
Globulin: 3.7 g/dL (ref 1.9–3.7)
Glucose, Bld: 89 mg/dL (ref 65–99)
Potassium: 4.7 mmol/L (ref 3.5–5.3)
Sodium: 136 mmol/L (ref 135–146)
Total Bilirubin: 0.2 mg/dL (ref 0.2–1.2)
Total Protein: 7.6 g/dL (ref 6.1–8.1)

## 2023-08-14 LAB — CBC WITH DIFFERENTIAL/PLATELET
Absolute Monocytes: 423 {cells}/uL (ref 200–950)
Basophils Absolute: 63 {cells}/uL (ref 0–200)
Basophils Relative: 0.7 %
Eosinophils Absolute: 243 {cells}/uL (ref 15–500)
Eosinophils Relative: 2.7 %
HCT: 27.7 % — ABNORMAL LOW (ref 38.5–50.0)
Hemoglobin: 8.1 g/dL — ABNORMAL LOW (ref 13.2–17.1)
Lymphs Abs: 2394 {cells}/uL (ref 850–3900)
MCH: 22.1 pg — ABNORMAL LOW (ref 27.0–33.0)
MCHC: 29.2 g/dL — ABNORMAL LOW (ref 32.0–36.0)
MCV: 75.5 fL — ABNORMAL LOW (ref 80.0–100.0)
MPV: 10.7 fL (ref 7.5–12.5)
Monocytes Relative: 4.7 %
Neutro Abs: 5877 {cells}/uL (ref 1500–7800)
Neutrophils Relative %: 65.3 %
Platelets: 444 10*3/uL — ABNORMAL HIGH (ref 140–400)
RBC: 3.67 10*6/uL — ABNORMAL LOW (ref 4.20–5.80)
RDW: 17 % — ABNORMAL HIGH (ref 11.0–15.0)
Total Lymphocyte: 26.6 %
WBC: 9 10*3/uL (ref 3.8–10.8)

## 2023-08-14 LAB — PHOSPHORUS: Phosphorus: 5.5 mg/dL — ABNORMAL HIGH (ref 2.5–4.5)

## 2023-08-14 LAB — TRIGLYCERIDES: Triglycerides: 71 mg/dL (ref <150)

## 2023-08-14 LAB — MAGNESIUM: Magnesium: 2 mg/dL (ref 1.5–2.5)

## 2023-08-18 ENCOUNTER — Ambulatory Visit: Payer: Medicaid Other

## 2023-08-20 ENCOUNTER — Encounter: Payer: Medicaid Other | Attending: General Surgery | Admitting: Internal Medicine

## 2023-08-20 ENCOUNTER — Other Ambulatory Visit: Payer: Self-pay | Admitting: Emergency Medicine

## 2023-08-20 VITALS — BP 118/88 | HR 70 | Temp 98.1°F | Resp 14

## 2023-08-20 DIAGNOSIS — Z452 Encounter for adjustment and management of vascular access device: Secondary | ICD-10-CM | POA: Insufficient documentation

## 2023-08-20 MED ORDER — HEPARIN SOD (PORK) LOCK FLUSH 100 UNIT/ML IV SOLN
250.0000 [IU] | Freq: Once | INTRAVENOUS | Status: AC | PRN
  Administered 2023-08-20: 250 [IU]

## 2023-08-20 MED ORDER — SODIUM CHLORIDE 0.9% FLUSH
10.0000 mL | Freq: Once | INTRAVENOUS | Status: AC | PRN
  Administered 2023-08-20: 10 mL

## 2023-08-20 NOTE — Progress Notes (Signed)
Diagnosis: PICC Line in Place  Provider:  Chilton Greathouse, MD  Procedure: PICC Dressing Change  Labs Drawn, Line Flushed, and Dressing Changed  Discharge: Condition: Good, Destination: Home . AVS provided to patient.   Performed by:  Feliberto Harts, LPN

## 2023-08-21 ENCOUNTER — Other Ambulatory Visit (HOSPITAL_COMMUNITY): Payer: Self-pay

## 2023-08-21 ENCOUNTER — Encounter: Payer: Self-pay | Admitting: Gastroenterology

## 2023-08-21 LAB — COMPREHENSIVE METABOLIC PANEL
AG Ratio: 1.2 (calc) (ref 1.0–2.5)
ALT: 17 U/L (ref 9–46)
AST: 14 U/L (ref 10–40)
Albumin: 4.2 g/dL (ref 3.6–5.1)
Alkaline phosphatase (APISO): 125 U/L (ref 36–130)
BUN: 23 mg/dL (ref 7–25)
CO2: 23 mmol/L (ref 20–32)
Calcium: 9.5 mg/dL (ref 8.6–10.3)
Chloride: 102 mmol/L (ref 98–110)
Creat: 0.7 mg/dL (ref 0.60–1.29)
Globulin: 3.6 g/dL (ref 1.9–3.7)
Glucose, Bld: 96 mg/dL (ref 65–99)
Potassium: 4.5 mmol/L (ref 3.5–5.3)
Sodium: 137 mmol/L (ref 135–146)
Total Bilirubin: 0.2 mg/dL (ref 0.2–1.2)
Total Protein: 7.8 g/dL (ref 6.1–8.1)

## 2023-08-21 LAB — CBC WITH DIFFERENTIAL/PLATELET
Absolute Monocytes: 260 {cells}/uL (ref 200–950)
Basophils Absolute: 47 {cells}/uL (ref 0–200)
Basophils Relative: 0.5 %
Eosinophils Absolute: 74 cells/uL (ref 15–500)
Eosinophils Relative: 0.8 %
HCT: 28.8 % — ABNORMAL LOW (ref 38.5–50.0)
Hemoglobin: 8.5 g/dL — ABNORMAL LOW (ref 13.2–17.1)
Lymphs Abs: 2195 {cells}/uL (ref 850–3900)
MCH: 22.2 pg — ABNORMAL LOW (ref 27.0–33.0)
MCHC: 29.5 g/dL — ABNORMAL LOW (ref 32.0–36.0)
MCV: 75.2 fL — ABNORMAL LOW (ref 80.0–100.0)
MPV: 10.7 fL (ref 7.5–12.5)
Monocytes Relative: 2.8 %
Neutro Abs: 6724 {cells}/uL (ref 1500–7800)
Neutrophils Relative %: 72.3 %
Platelets: 494 10*3/uL — ABNORMAL HIGH (ref 140–400)
RBC: 3.83 10*6/uL — ABNORMAL LOW (ref 4.20–5.80)
RDW: 17.2 % — ABNORMAL HIGH (ref 11.0–15.0)
Total Lymphocyte: 23.6 %
WBC: 9.3 10*3/uL (ref 3.8–10.8)

## 2023-08-21 LAB — MAGNESIUM: Magnesium: 2 mg/dL (ref 1.5–2.5)

## 2023-08-21 LAB — PHOSPHORUS: Phosphorus: 4.7 mg/dL — ABNORMAL HIGH (ref 2.5–4.5)

## 2023-08-21 LAB — TRIGLYCERIDES: Triglycerides: 61 mg/dL (ref <150)

## 2023-08-21 MED ORDER — HYDROMORPHONE HCL 2 MG PO TABS
2.0000 mg | ORAL_TABLET | ORAL | 0 refills | Status: DC | PRN
  Filled 2023-08-21: qty 50, 5d supply, fill #0

## 2023-08-25 ENCOUNTER — Other Ambulatory Visit: Payer: Self-pay | Admitting: Emergency Medicine

## 2023-08-25 ENCOUNTER — Encounter: Payer: Medicaid Other | Admitting: *Deleted

## 2023-08-25 VITALS — BP 111/67 | HR 57 | Temp 98.0°F | Resp 16

## 2023-08-25 DIAGNOSIS — Z452 Encounter for adjustment and management of vascular access device: Secondary | ICD-10-CM

## 2023-08-25 DIAGNOSIS — E43 Unspecified severe protein-calorie malnutrition: Secondary | ICD-10-CM

## 2023-08-25 MED ORDER — HEPARIN SOD (PORK) LOCK FLUSH 100 UNIT/ML IV SOLN
500.0000 [IU] | Freq: Once | INTRAVENOUS | Status: AC | PRN
  Administered 2023-08-25: 500 [IU]

## 2023-08-25 NOTE — Progress Notes (Signed)
PICC flushed and labs drawn. No complications noted.

## 2023-08-26 LAB — COMPREHENSIVE METABOLIC PANEL
AG Ratio: 1.2 (calc) (ref 1.0–2.5)
ALT: 26 U/L (ref 9–46)
AST: 27 U/L (ref 10–40)
Albumin: 4.2 g/dL (ref 3.6–5.1)
Alkaline phosphatase (APISO): 101 U/L (ref 36–130)
BUN: 22 mg/dL (ref 7–25)
CO2: 27 mmol/L (ref 20–32)
Calcium: 9.4 mg/dL (ref 8.6–10.3)
Chloride: 102 mmol/L (ref 98–110)
Creat: 0.62 mg/dL (ref 0.60–1.29)
Globulin: 3.4 g/dL (ref 1.9–3.7)
Glucose, Bld: 92 mg/dL (ref 65–99)
Potassium: 4.4 mmol/L (ref 3.5–5.3)
Sodium: 137 mmol/L (ref 135–146)
Total Bilirubin: 0.3 mg/dL (ref 0.2–1.2)
Total Protein: 7.6 g/dL (ref 6.1–8.1)

## 2023-08-26 LAB — MAGNESIUM: Magnesium: 2 mg/dL (ref 1.5–2.5)

## 2023-08-26 LAB — PHOSPHORUS: Phosphorus: 4.7 mg/dL — ABNORMAL HIGH (ref 2.5–4.5)

## 2023-08-26 LAB — TRIGLYCERIDES: Triglycerides: 55 mg/dL (ref <150)

## 2023-08-27 ENCOUNTER — Other Ambulatory Visit: Payer: Self-pay | Admitting: Emergency Medicine

## 2023-08-27 NOTE — Progress Notes (Addendum)
Referring Provider: Vertis Kelch, NP Primary Care Physician:  Vertis Kelch, NP Primary GI Physician: Dr. Levon Hedger  Chief Complaint  Patient presents with   New Patient (Initial Visit)    Pt referred for gas    HPI:   Alan Jennings is a 42 y.o. 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 [name].  Postop course has been complicated by ongoing abdominal pain, weight loss, abdominal distention, inability to eat. Readmitted to the hospital 7/16.  CT A/P with contrast showed postsurgical changes in the stomach, stomach was somewhat distended with ingested material, prominent dilation of small bowel with gas and fluid and including small bowel feces sign suggesting chronic obstruction, prominent stool in the colon consistent with constipation.  Per general surgery notes, patient had delayed gastric emptying and gastroparesis.  He was started on TPN during that admission and was ultimately discharged home on TPN with G-tube intermittent clamping.  At his most recent visit with general surgery on 9/6.  Noted patient was improving a bit.  He was going most of the day without opening the G-tube.  When he did open it during the day, it was due to pain and mostly just a significant quantity of gas but come out.  There was minimal liquid that was coming out.  When he empties at night before bed, about 200 cc was coming out.  He was drinking 2 Ensure during the day, some Gatorade, some applesauce, and some pudding.  Continue to have TPN at night and had gained 2 pounds.  He did continue with significant pain on both sides of his abdomen.  Dr. [name] query whether some of his gas pain was related to lactose  intolerance.  Patient was advised to try small amounts of soft solid foods.  Recommended to doing hematology/oncology for chronic anemia, nutritionist, and GI, and follow-up in their office in 3 weeks.  Today: Has terrible gas build up. Almost immediately after po consumption. He does eat a few soft foods, but mostly liquids.  Drinking 2-3 Ensure daily. Tries to let his food sit as long as possible, but has to open G tube to let the gas out a few times a day. Gas problem was present prior to the surgery, but has been worse since surgery. No nausea or vomiting. Has always had trouble with dairy.  Did not think about Ensure containing lactose.  Does not consume any other dairy products.  Bowels are moving every 3-4 days. Has had 3-4 episodes of diarrhea since surgery, otherwise having formed stool. No brbpr or melena.  He has not taking MiraLAX or anything to help with bowel regularity.  No BRBPR or melena.  Reflux maybe every 3-4 days. Overall, much better than it used to be. Has epigastric burning to some degree postprandially, but symptoms are worse at night when laying down. If he drains fluid from his stomach he will feel better. Emptying every 2-3 hours at night due to the burning and gas which is affecting his sleep. Taking generic equivalent of Phazyme daily which has provided some relief.  He also notes pain across his lower abdomen.  Symptoms are worse when laying down but are present most of the time.  Has been present since the surgery.  Seems to be worsening.  He continues with TNP nightly. Scared to eat. Afraid stomach is going to "kill him".  He has started to put some weight back on slowly since he is adding soft foods back to his diet.  He was previously around 150 pounds, started losing weight prior to surgery and was down to 132 pounds day of his surgery.  He had lost down to 106 pounds documented on 07/27/2023.  No NSAIDs.   Reglan is listed on medication list.  States he took this a  couple of times for nausea after his surgery, but does not take this routinely. Carafate is also listed on medication list.  Does not take this routinely.   Last EGD 05/21/2023: Large amount of food residue in the stomach, gastritis biopsied, 12 mm and 10 mm nonbleeding duodenal ulcers with clean base, acquired severe duodenal stenosis.     Past Medical History:  Diagnosis Date   Abdominal pain, chronic, epigastric    Anxiety    Arthritis    Back pain    Complication of anesthesia    had a syncopal episode after rhinoplasty   Depression    GERD (gastroesophageal reflux disease)    GI bleed    Headache    History of kidney stones    Hypertension    Neurogenic pain    Peptic ulcer disease    PTSD (post-traumatic stress disorder)    Seizures (HCC)    Seizures (HCC)     Past Surgical History:  Procedure Laterality Date   BIOPSY  06/25/2018   Procedure: BIOPSY;  Surgeon: Malissa Hippo, MD;  Location: [facility];  Service: Endoscopy;;  gastric   BIOPSY  05/21/2023   Procedure: BIOPSY;  Surgeon: Lynann Bologna, DO;  Location: [facility];  Service: Gastroenterology;;   ESOPHAGOGASTRODUODENOSCOPY N/A 05/21/2023   Procedure: ESOPHAGOGASTRODUODENOSCOPY (EGD);  Surgeon: Lynann Bologna, DO;  Location: [facility];  Service: Gastroenterology;  Laterality: N/A;   ESOPHAGOGASTRODUODENOSCOPY (EGD) WITH PROPOFOL N/A 06/25/2018   Procedure: ESOPHAGOGASTRODUODENOSCOPY (EGD) WITH PROPOFOL;  Surgeon: Malissa Hippo, MD;  [facility]: [facility];  Service: Endoscopy;  Laterality: N/A;   ESOPHAGOGASTRODUODENOSCOPY (EGD) WITH PROPOFOL N/A 05/13/2022   Procedure: ESOPHAGOGASTRODUODENOSCOPY (EGD) WITH PROPOFOL;  Surgeon: Dolores Frame, MD;  [facility]: [facility];  Service: Gastroenterology;  Laterality: N/A;   GASTROSTOMY  06/17/2023   Procedure: INSERTION OF GASTROSTOMY TUBE;  Surgeon: Almond Lint, MD;  Location: [facility];  Service: General;;   NOSE SURGERY     [facility]    ORIF HUMERUS FRACTURE  04/26/2012   Procedure: OPEN REDUCTION INTERNAL FIXATION (ORIF) PROXIMAL HUMERUS FRACTURE;  Surgeon: Vickki Hearing, MD;  Location: [facility];  Service: Orthopedics;  Laterality: Left;  Open Reduction Internal Fixation of Left Proximal Humerus Fracture, Closing Wedge Osteotomy, Bone Graft   RHINOPLASTY  2003   [facility]   SHOULDER SURGERY Left 2013    Current Outpatient Medications  Medication Sig Dispense Refill   acetaminophen (TYLENOL) 500 MG tablet Take 500-1,000 mg by mouth every 6 (six) hours as needed (for headaches).     buprenorphine (SUBUTEX) 8 MG SUBL SL tablet Place 1 tablet (8 mg total) under the tongue 3 (three) times daily. 90 tablet 0   calcium carbonate (TUMS - DOSED IN MG ELEMENTAL CALCIUM) 500 MG chewable tablet Chew 1 tablet by mouth daily as needed for indigestion or heartburn.     clonazePAM (KLONOPIN) 1 MG disintegrating tablet Take 1 tablet (1 mg total) by mouth  daily as needed for severe anxiety. (This is a 1 month supply) 15 tablet 0   methocarbamol (ROBAXIN) 500 MG tablet Take 1 tablet (500 mg total) by mouth 3 (three) times daily as needed 30 tablet 4   ondansetron (ZOFRAN-ODT) 4 MG disintegrating tablet Dissolve 1 tablet in mouth every 6 (six) hours as needed for nausea. 20 tablet 0   sucralfate (CARAFATE) 1 g tablet Take 1 tablet (1 g total) by mouth 4 (four) times daily. 120 tablet 3   metoCLOPramide (REGLAN) 10 MG tablet Take 1 tablet (10 mg total) by mouth 4 (four) times daily as needed (Patient not taking: Reported on 08/28/2023) 60 tablet 1   pantoprazole (PROTONIX) 40 MG tablet Take 1 tablet (40 mg total) by mouth daily. 60 tablet 3   polyethylene glycol (MIRALAX / GLYCOLAX) 17 g packet Take 17 g by mouth daily. (Patient not taking: Reported on 08/28/2023) 14 each 0   No current facility-administered medications for this visit.    Allergies as of 08/28/2023 - Review Complete 08/28/2023  Allergen Reaction Noted   Tramadol Other (See  Comments)    Nsaids Other (See Comments) 09/19/2018    Family History  Problem Relation Age of Onset   Heart disease Other    Arthritis Other    Anesthesia problems Neg Hx    Hypotension Neg Hx    Malignant hyperthermia Neg Hx    Pseudochol deficiency Neg Hx     Social History   Socioeconomic History   Marital status: Single    Spouse name: Not on file   Number of children: Not on file   Years of education: college   Highest education level: Not on file  Occupational History   Occupation: none    Employer: SELF EMPLOYED  Tobacco Use   Smoking status: Former    Current packs/day: 0.00    Types: E-cigarettes, Cigarettes    Quit date: 2019    Years since quitting: 5.7   Smokeless tobacco: Never  Vaping Use   Vaping status: Every Day  Substance and Sexual Activity   Alcohol use: No   Drug use: Not Currently    Types: Marijuana   Sexual activity: Not Currently  Other Topics Concern   Not on file  Social History Narrative   Not on file   Social Determinants of Health   Financial Resource Strain: Low Risk  (08/27/2023)   Received from [facility]   Overall Financial Resource Strain (CARDIA)    Difficulty of Paying Living Expenses: Not hard at all  Food Insecurity: No Food Insecurity (08/27/2023)   Received from [facility]   Hunger Vital Sign    Worried About Running Out of Food in the Last Year: Never true    Ran Out of Food in the Last Year: Never true  Transportation Needs: No Transportation Needs (06/17/2023)   PRAPARE - Administrator, Civil Service (Medical): No    Lack of Transportation (Non-Medical): No  Physical Activity: Sufficiently Active (08/27/2023)   Received from [facility]   Exercise Vital Sign    Days of Exercise per Week: 7 days    Minutes of Exercise per Session: 30 min  Stress: Stress Concern Present (08/27/2023)   Received from [facility] - Occupational Stress  Questionnaire    Feeling of Stress : To some extent  Social Connections: Moderately Isolated (08/27/2023)   Received from [facility]  Social Advertising account executive [NHANES]    Frequency of Communication with Friends and Family: More than three times a week    Frequency of Social Gatherings with Friends and Family: Once a week    Attends Religious Services: More than 4 times per year    Active Member of [employer] or Organizations: No    Attends Banker Meetings: Never    Marital Status: Never married    Review of Systems: Gen: Denies fever, chills, cold or flex intense, presyncope, syncope. CV: Denies chest pain, palpitations. Resp: Denies dyspnea, cough. GI: Denies vomiting blood, jaundice, and fecal incontinence.   Denies dysphagia or odynophagia. Derm: Denies rash. Psych: Denies depression, anxiety. Heme: See HPI  Physical Exam: BP 109/64   Pulse 61   Temp 98.6 F (37 C)   Ht 5\' 5"  (1.651 m)   Wt 113 lb (51.3 kg)   BMI 18.80 kg/m  General:   Alert and oriented. No distress noted. Pleasant and cooperative.  Thin. Head:  Normocephalic and atraumatic. Eyes:  Conjuctiva clear without scleral icterus. Heart:  S1, S2 present without murmurs appreciated. Lungs:  Clear to auscultation bilaterally. No wheezes, rales, or rhonchi. No distress.  Abdomen:  +BS, soft, and non-distended.  Moderate tenderness to light palpation across lower abdomen without rebound or guarding.  G-tube in place with dressing around tube site. No HSM or masses noted. Msk:  Symmetrical without gross deformities. Normal posture. Extremities:  Without edema. Neurologic:  Alert and  oriented x4 Psych:  Normal mood and affect.    Assessment:  42 y.o. 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 [name]. Postop course has been complicated by ongoing abdominal pain, weight loss, abdominal distention, inability to advance to regular diet. Ultimately started on TPN in mid July. He is presenting today at the request of surgeon, Dr. [name], for further evaluation of abdominal pain and gas.   Lower abdominal pain:  Present since robotic assisted gastrectomy in July, but seems to be worsening somewhat.  Symptoms may be multifactorial including recent surgery/healing process, adhesions, constipation, gas pain, but unable to rule out other acute etiologies such as appendicitis, complication from surgery, abscess development, less likely colitis as he denies diarrhea.  I have recommended CT A/P with contrast for further evaluation.   Epigastric burning:  Ongoing postprandial epigastric burning, but reports symptoms are most severe when laying down at night.  Symptoms were present prior to gastrectomy in the setting of duodenal ulcers, but have persisted since his surgery.  He gets relief when opening his G-tube and draining gastric contents.  He has no longer using NSAIDs.  He may have gastritis.  Unable to rule out anastomotic ulcer.  There is mention of gastroparesis and general surgery's notes though there has been no official diagnosis.  This was suspected to be secondary to duodenal stricture previously.  Recent upper GI series showed prompt emptying of contents from the stomach into the jejunum with no evidence of stricture.  Recommended increasing pantoprazole to twice daily and taking Carafate 4 times daily.  Will likely repeat EGD in the near future pending CT results for lower abdominal pain as discussed above.  Gas pain: Chronic, but worse since gastrectomy.  May be related to lactose intolerance as  reports chronic issues with dairy, but has been consuming 2-3 Ensure daily which has been needed as he  has been following a primarily liquid diet.  Recommended looking for other protein shakes that are lactose-free or trying Lactaid tablets prior to dairy consumption.  GERD:  Breakthrough symptoms a couple times a week on pantoprazole daily.  Will increase pantoprazole to twice daily.  Constipation:  Bowel movements every 3 to 4 days, not currently taking anything to help with this.  Recommended MiraLAX daily.   Plan:  CT A/P with contrast ASAP Continue to avoid dairy.  Look for protein shakes that do not contain lactose or try taking Lactaid tablets prior to drinking Ensure. Increase pantoprazole to 40 mg twice daily before meals. Start Carafate 3 times daily before meals and at bedtime. Start MiraLAX 17 g daily. Further recommendations following CT.    Ermalinda Memos, PA-C [facility] 08/28/2023   I have reviewed the note and agree with the APP's assessment as described in this progress note  Complex history of PUD possibly due to NSAID and high dose aspirin use, recently requiring Bilroth II distal gastrectomy. Unclear if current symptoms are related to postsurgical changes, but post surgical small bowel series was normal. Will assess this further with CT abdomen . If unremarkable, will need to repeat EGD and possibly proceed with GES. Hopefully he can come off TPN at some point. Needs to abstain from NSAIDs and high dose aspirin.  Katrinka Blazing, MD Gastroenterology and Hepatology [facility]

## 2023-08-28 ENCOUNTER — Ambulatory Visit (INDEPENDENT_AMBULATORY_CARE_PROVIDER_SITE_OTHER): Payer: Medicaid Other | Admitting: Gastroenterology

## 2023-08-28 ENCOUNTER — Encounter: Payer: Self-pay | Admitting: Gastroenterology

## 2023-08-28 ENCOUNTER — Other Ambulatory Visit (HOSPITAL_COMMUNITY): Payer: Self-pay

## 2023-08-28 ENCOUNTER — Telehealth: Payer: Self-pay | Admitting: *Deleted

## 2023-08-28 VITALS — BP 109/64 | HR 61 | Temp 98.6°F | Ht 65.0 in | Wt 113.0 lb

## 2023-08-28 DIAGNOSIS — R1030 Lower abdominal pain, unspecified: Secondary | ICD-10-CM | POA: Diagnosis not present

## 2023-08-28 DIAGNOSIS — K5900 Constipation, unspecified: Secondary | ICD-10-CM

## 2023-08-28 DIAGNOSIS — R141 Gas pain: Secondary | ICD-10-CM

## 2023-08-28 DIAGNOSIS — K219 Gastro-esophageal reflux disease without esophagitis: Secondary | ICD-10-CM | POA: Diagnosis not present

## 2023-08-28 DIAGNOSIS — R1013 Epigastric pain: Secondary | ICD-10-CM

## 2023-08-28 MED ORDER — PANTOPRAZOLE SODIUM 40 MG PO TBEC
40.0000 mg | DELAYED_RELEASE_TABLET | Freq: Every day | ORAL | 3 refills | Status: DC
  Filled 2023-08-28: qty 60, 60d supply, fill #0

## 2023-08-28 NOTE — Telephone Encounter (Signed)
RADmd PA for CT: Current Status: Approved Validity Period: 08/28/2023 - 10/27/2023 Authorization: [id number] (Abdomen and Pelvis CT)

## 2023-08-28 NOTE — Telephone Encounter (Signed)
Pt informed that CT is scheduled for 09/01/23, arrive at 3:15 pm to check in, NPO 4 hours prior.

## 2023-08-28 NOTE — Patient Instructions (Addendum)
We will arrange for you to have a CT of your abdomen and pelvis in the near future at [facility].  Your gas/bloating could be related to lactose.  You can try looking for protein shakes that do not contain dairy, or you can try taking Lactaid, 3 tablets before dairy consumption.  I would like for you to increase pantoprazole to twice daily before meals.  I would also like for you to take Carafate 3 times daily before meals and at bedtime.  For constipation, start MiraLAX 17 g daily in 8 ounces of water or other noncarbonated beverage of your choice.  We will call you with your CT results and further recommendations.  Ermalinda Memos, PA-C [facility]

## 2023-08-31 ENCOUNTER — Encounter: Payer: Medicaid Other | Admitting: *Deleted

## 2023-08-31 ENCOUNTER — Encounter: Payer: Self-pay | Admitting: Gastroenterology

## 2023-08-31 ENCOUNTER — Other Ambulatory Visit: Payer: Self-pay | Admitting: Emergency Medicine

## 2023-08-31 VITALS — BP 112/81 | HR 74 | Temp 98.6°F | Resp 16

## 2023-08-31 DIAGNOSIS — Z452 Encounter for adjustment and management of vascular access device: Secondary | ICD-10-CM | POA: Diagnosis not present

## 2023-08-31 MED ORDER — HEPARIN SOD (PORK) LOCK FLUSH 100 UNIT/ML IV SOLN
250.0000 [IU] | Freq: Once | INTRAVENOUS | Status: AC | PRN
  Administered 2023-08-31: 250 [IU]

## 2023-08-31 NOTE — Progress Notes (Signed)
Diagnosis: Protein Calorie Malnutrition  Provider Marilynne Halsted MD  Procedure: PICC Dressing Change  Labs Drawn, Line Flushed, and Dressing Changed  Discharge: Condition: Good, Destination: Home . AVS provided to patient.   Performed by:  Daleen Squibb, RN

## 2023-09-01 ENCOUNTER — Ambulatory Visit (HOSPITAL_COMMUNITY): Payer: Medicaid Other

## 2023-09-01 ENCOUNTER — Other Ambulatory Visit: Payer: Self-pay

## 2023-09-01 ENCOUNTER — Other Ambulatory Visit (HOSPITAL_COMMUNITY): Payer: Self-pay

## 2023-09-01 ENCOUNTER — Encounter: Payer: Self-pay | Admitting: Gastroenterology

## 2023-09-01 LAB — COMPREHENSIVE METABOLIC PANEL
AG Ratio: 1.3 (calc) (ref 1.0–2.5)
ALT: 12 U/L (ref 9–46)
AST: 12 U/L (ref 10–40)
Albumin: 4.4 g/dL (ref 3.6–5.1)
Alkaline phosphatase (APISO): 84 U/L (ref 36–130)
BUN: 19 mg/dL (ref 7–25)
CO2: 26 mmol/L (ref 20–32)
Calcium: 9.7 mg/dL (ref 8.6–10.3)
Chloride: 102 mmol/L (ref 98–110)
Creat: 0.71 mg/dL (ref 0.60–1.29)
Globulin: 3.3 g/dL (ref 1.9–3.7)
Glucose, Bld: 99 mg/dL (ref 65–99)
Potassium: 4.3 mmol/L (ref 3.5–5.3)
Sodium: 136 mmol/L (ref 135–146)
Total Bilirubin: 0.3 mg/dL (ref 0.2–1.2)
Total Protein: 7.7 g/dL (ref 6.1–8.1)

## 2023-09-01 LAB — MAGNESIUM: Magnesium: 2 mg/dL (ref 1.5–2.5)

## 2023-09-01 LAB — TRIGLYCERIDES: Triglycerides: 55 mg/dL (ref <150)

## 2023-09-01 LAB — PHOSPHORUS: Phosphorus: 4.5 mg/dL (ref 2.5–4.5)

## 2023-09-01 MED ORDER — CLONAZEPAM 1 MG PO TBDP
1.0000 mg | ORAL_TABLET | Freq: Every day | ORAL | 0 refills | Status: DC
  Filled 2023-09-01: qty 15, 15d supply, fill #0

## 2023-09-01 MED ORDER — BUPRENORPHINE HCL 8 MG SL SUBL
8.0000 mg | SUBLINGUAL_TABLET | Freq: Three times a day (TID) | SUBLINGUAL | 0 refills | Status: DC
Start: 2023-09-01 — End: 2023-09-29
  Filled 2023-09-01: qty 60, 20d supply, fill #0
  Filled 2023-09-01: qty 30, 10d supply, fill #0
  Filled 2023-09-01: qty 90, 30d supply, fill #0

## 2023-09-02 ENCOUNTER — Other Ambulatory Visit (HOSPITAL_COMMUNITY): Payer: Self-pay

## 2023-09-02 ENCOUNTER — Other Ambulatory Visit: Payer: Self-pay

## 2023-09-02 ENCOUNTER — Encounter: Payer: Self-pay | Admitting: Gastroenterology

## 2023-09-02 MED ORDER — HYDROMORPHONE HCL 2 MG PO TABS
2.0000 mg | ORAL_TABLET | ORAL | 0 refills | Status: DC | PRN
Start: 2023-09-02 — End: 2024-02-10
  Filled 2023-09-02: qty 50, 5d supply, fill #0

## 2023-09-07 ENCOUNTER — Other Ambulatory Visit: Payer: Self-pay | Admitting: Emergency Medicine

## 2023-09-07 ENCOUNTER — Encounter: Payer: Medicaid Other | Admitting: *Deleted

## 2023-09-07 VITALS — BP 111/62 | HR 64 | Temp 98.2°F | Resp 14

## 2023-09-07 DIAGNOSIS — E43 Unspecified severe protein-calorie malnutrition: Secondary | ICD-10-CM

## 2023-09-07 DIAGNOSIS — Z452 Encounter for adjustment and management of vascular access device: Secondary | ICD-10-CM

## 2023-09-07 MED ORDER — SODIUM CHLORIDE 0.9% FLUSH: 10.0000 mL | Freq: Once | INTRAVENOUS | Status: DC | PRN

## 2023-09-07 MED ORDER — HEPARIN SOD (PORK) LOCK FLUSH 100 UNIT/ML IV SOLN
250.0000 [IU] | Freq: Once | INTRAVENOUS | Status: AC | PRN
  Administered 2023-09-07: 250 [IU]

## 2023-09-07 NOTE — Progress Notes (Signed)
Diagnosis: PICC in place  Provider: F. [name], MD  Procedure: PICC Dressing Change  Labs Drawn, Line Flushed, and Dressing Changed  Discharge: Condition: Good, Destination: Home . AVS provided to patient.   Performed by:  Daleen Squibb, RN

## 2023-09-08 ENCOUNTER — Ambulatory Visit (HOSPITAL_COMMUNITY): Payer: Medicaid Other

## 2023-09-08 ENCOUNTER — Encounter (HOSPITAL_COMMUNITY): Payer: Self-pay

## 2023-09-08 ENCOUNTER — Ambulatory Visit (HOSPITAL_COMMUNITY): Admission: RE | Admit: 2023-09-08 | Payer: Medicaid Other | Source: Ambulatory Visit

## 2023-09-08 LAB — COMPREHENSIVE METABOLIC PANEL
AG Ratio: 1.1 (calc) (ref 1.0–2.5)
ALT: 11 U/L (ref 9–46)
AST: 17 U/L (ref 10–40)
Albumin: 3.9 g/dL (ref 3.6–5.1)
Alkaline phosphatase (APISO): 73 U/L (ref 36–130)
BUN: 18 mg/dL (ref 7–25)
CO2: 21 mmol/L (ref 20–32)
Calcium: 9.1 mg/dL (ref 8.6–10.3)
Chloride: 103 mmol/L (ref 98–110)
Creat: 0.66 mg/dL (ref 0.60–1.29)
Globulin: 3.4 g/dL (calc) (ref 1.9–3.7)
Glucose, Bld: 85 mg/dL (ref 65–99)
Potassium: 4.2 mmol/L (ref 3.5–5.3)
Sodium: 136 mmol/L (ref 135–146)
Total Bilirubin: 0.2 mg/dL (ref 0.2–1.2)
Total Protein: 7.3 g/dL (ref 6.1–8.1)

## 2023-09-08 LAB — CBC WITH DIFFERENTIAL/PLATELET
Absolute Monocytes: 420 cells/uL (ref 200–950)
Basophils Absolute: 60 cells/uL (ref 0–200)
Basophils Relative: 0.8 %
Eosinophils Absolute: 300 cells/uL (ref 15–500)
Eosinophils Relative: 4 %
HCT: 26 % — ABNORMAL LOW (ref 38.5–50.0)
Hemoglobin: 7.6 g/dL — ABNORMAL LOW (ref 13.2–17.1)
Lymphs Abs: 2273 cells/uL (ref 850–3900)
MCH: 21.9 pg — ABNORMAL LOW (ref 27.0–33.0)
MCHC: 29.2 g/dL — ABNORMAL LOW (ref 32.0–36.0)
MCV: 74.9 fL — ABNORMAL LOW (ref 80.0–100.0)
MPV: 11.4 fL (ref 7.5–12.5)
Monocytes Relative: 5.6 %
Neutro Abs: 4448 cells/uL (ref 1500–7800)
Neutrophils Relative %: 59.3 %
Platelets: 265 10*3/uL (ref 140–400)
RBC: 3.47 10*6/uL — ABNORMAL LOW (ref 4.20–5.80)
RDW: 17 % — ABNORMAL HIGH (ref 11.0–15.0)
Total Lymphocyte: 30.3 %
WBC: 7.5 10*3/uL (ref 3.8–10.8)

## 2023-09-08 LAB — TRIGLYCERIDES: Triglycerides: 56 mg/dL (ref <150)

## 2023-09-08 LAB — MAGNESIUM: Magnesium: 1.9 mg/dL (ref 1.5–2.5)

## 2023-09-08 LAB — PHOSPHORUS: Phosphorus: 4.5 mg/dL (ref 2.5–4.5)

## 2023-09-10 ENCOUNTER — Other Ambulatory Visit: Payer: Self-pay | Admitting: Emergency Medicine

## 2023-09-10 ENCOUNTER — Encounter: Payer: Medicaid Other | Admitting: Emergency Medicine

## 2023-09-10 VITALS — BP 102/63 | HR 65 | Temp 97.8°F | Resp 16

## 2023-09-10 DIAGNOSIS — Z452 Encounter for adjustment and management of vascular access device: Secondary | ICD-10-CM

## 2023-09-10 DIAGNOSIS — E43 Unspecified severe protein-calorie malnutrition: Secondary | ICD-10-CM

## 2023-09-10 MED ORDER — HEPARIN SOD (PORK) LOCK FLUSH 100 UNIT/ML IV SOLN
250.0000 [IU] | Freq: Once | INTRAVENOUS | Status: AC | PRN
  Administered 2023-09-10: 250 [IU]

## 2023-09-10 MED ORDER — SODIUM CHLORIDE 0.9% FLUSH: 10.0000 mL | Freq: Once | INTRAVENOUS | Status: DC | PRN

## 2023-09-10 NOTE — Progress Notes (Signed)
Alan Jennings presented for PICC line flush and labs.  See IV assessment in docflowsheets for PICC details.  Proper placement of PICC confirmed by CXR.  PICC located right arm.  Good blood return present. PICC flushed with 20ml NS and 250U Heparin, see MAR for further details.  Alan Jennings tolerated procedure well and without incident.

## 2023-09-11 ENCOUNTER — Other Ambulatory Visit (HOSPITAL_COMMUNITY): Payer: Self-pay

## 2023-09-11 ENCOUNTER — Encounter: Payer: Self-pay | Admitting: Gastroenterology

## 2023-09-11 LAB — COMPREHENSIVE METABOLIC PANEL
AG Ratio: 1.2 (calc) (ref 1.0–2.5)
ALT: 10 U/L (ref 9–46)
AST: 13 U/L (ref 10–40)
Albumin: 4.1 g/dL (ref 3.6–5.1)
Alkaline phosphatase (APISO): 80 U/L (ref 36–130)
BUN: 19 mg/dL (ref 7–25)
CO2: 24 mmol/L (ref 20–32)
Calcium: 9.3 mg/dL (ref 8.6–10.3)
Chloride: 102 mmol/L (ref 98–110)
Creat: 0.76 mg/dL (ref 0.60–1.29)
Globulin: 3.3 g/dL (ref 1.9–3.7)
Glucose, Bld: 97 mg/dL (ref 65–99)
Potassium: 4 mmol/L (ref 3.5–5.3)
Sodium: 135 mmol/L (ref 135–146)
Total Bilirubin: 0.2 mg/dL (ref 0.2–1.2)
Total Protein: 7.4 g/dL (ref 6.1–8.1)

## 2023-09-11 LAB — TRIGLYCERIDES: Triglycerides: 59 mg/dL (ref <150)

## 2023-09-11 LAB — MAGNESIUM: Magnesium: 1.8 mg/dL (ref 1.5–2.5)

## 2023-09-11 LAB — PHOSPHORUS: Phosphorus: 4.7 mg/dL — ABNORMAL HIGH (ref 2.5–4.5)

## 2023-09-11 MED ORDER — HYDROMORPHONE HCL 2 MG PO TABS
2.0000 mg | ORAL_TABLET | ORAL | 0 refills | Status: DC | PRN
  Filled 2023-09-11: qty 50, 5d supply, fill #0

## 2023-09-17 ENCOUNTER — Ambulatory Visit (HOSPITAL_BASED_OUTPATIENT_CLINIC_OR_DEPARTMENT_OTHER)
Admission: RE | Admit: 2023-09-17 | Discharge: 2023-09-17 | Disposition: A | Discharge status: Home / Self Care | Payer: Medicaid Other | Source: Ambulatory Visit | Attending: Gastroenterology | Admitting: Gastroenterology

## 2023-09-17 ENCOUNTER — Encounter: Payer: Medicaid Other | Attending: General Surgery | Admitting: Internal Medicine

## 2023-09-17 ENCOUNTER — Other Ambulatory Visit: Payer: Self-pay | Admitting: Emergency Medicine

## 2023-09-17 VITALS — BP 101/60 | HR 62 | Temp 98.4°F | Resp 14

## 2023-09-17 DIAGNOSIS — Z452 Encounter for adjustment and management of vascular access device: Secondary | ICD-10-CM | POA: Diagnosis present

## 2023-09-17 DIAGNOSIS — R1030 Lower abdominal pain, unspecified: Secondary | ICD-10-CM | POA: Diagnosis present

## 2023-09-17 DIAGNOSIS — E43 Unspecified severe protein-calorie malnutrition: Secondary | ICD-10-CM

## 2023-09-17 MED ORDER — IOHEXOL 300 MG/ML  SOLN
100.0000 mL | Freq: Once | INTRAMUSCULAR | Status: AC | PRN
  Administered 2023-09-17: 85 mL via INTRAVENOUS

## 2023-09-17 MED ORDER — SODIUM CHLORIDE 0.9% FLUSH: 10.0000 mL | Freq: Once | INTRAVENOUS | Status: DC | PRN

## 2023-09-17 MED ORDER — HEPARIN SOD (PORK) LOCK FLUSH 100 UNIT/ML IV SOLN
250.0000 [IU] | Freq: Once | INTRAVENOUS | Status: AC | PRN
  Administered 2023-09-17: 250 [IU]

## 2023-09-17 NOTE — Progress Notes (Signed)
Diagnosis: PICC Line in Place  Provider:  Chilton Greathouse, MD  Procedure: PICC Dressing Change  Labs Drawn, Line Flushed, and Dressing Changed  Discharge: Condition: Good, Destination: Home . AVS provided to patient.   Performed by:  Feliberto Harts, LPN

## 2023-09-18 ENCOUNTER — Encounter: Payer: Self-pay | Admitting: Gastroenterology

## 2023-09-18 ENCOUNTER — Other Ambulatory Visit (HOSPITAL_COMMUNITY): Payer: Self-pay

## 2023-09-18 LAB — MAGNESIUM: Magnesium: 2 mg/dL (ref 1.5–2.5)

## 2023-09-18 LAB — COMPREHENSIVE METABOLIC PANEL
AG Ratio: 1.2 (calc) (ref 1.0–2.5)
ALT: 10 U/L (ref 9–46)
AST: 10 U/L (ref 10–40)
Albumin: 4.1 g/dL (ref 3.6–5.1)
Alkaline phosphatase (APISO): 74 U/L (ref 36–130)
BUN: 22 mg/dL (ref 7–25)
CO2: 26 mmol/L (ref 20–32)
Calcium: 9.2 mg/dL (ref 8.6–10.3)
Chloride: 104 mmol/L (ref 98–110)
Creat: 0.73 mg/dL (ref 0.60–1.29)
Globulin: 3.3 g/dL (ref 1.9–3.7)
Glucose, Bld: 81 mg/dL (ref 65–99)
Potassium: 4.3 mmol/L (ref 3.5–5.3)
Sodium: 137 mmol/L (ref 135–146)
Total Bilirubin: 0.3 mg/dL (ref 0.2–1.2)
Total Protein: 7.4 g/dL (ref 6.1–8.1)

## 2023-09-18 LAB — CBC WITH DIFFERENTIAL/PLATELET
Absolute Monocytes: 349 {cells}/uL (ref 200–950)
Basophils Absolute: 60 {cells}/uL (ref 0–200)
Basophils Relative: 0.7 %
Eosinophils Absolute: 68 {cells}/uL (ref 15–500)
Eosinophils Relative: 0.8 %
HCT: 25.8 % — ABNORMAL LOW (ref 38.5–50.0)
Hemoglobin: 7.4 g/dL — ABNORMAL LOW (ref 13.2–17.1)
Lymphs Abs: 2125 {cells}/uL (ref 850–3900)
MCH: 22 pg — ABNORMAL LOW (ref 27.0–33.0)
MCHC: 28.7 g/dL — ABNORMAL LOW (ref 32.0–36.0)
MCV: 76.8 fL — ABNORMAL LOW (ref 80.0–100.0)
MPV: 11 fL (ref 7.5–12.5)
Monocytes Relative: 4.1 %
Neutro Abs: 5899 {cells}/uL (ref 1500–7800)
Neutrophils Relative %: 69.4 %
Platelets: 366 10*3/uL (ref 140–400)
RBC: 3.36 10*6/uL — ABNORMAL LOW (ref 4.20–5.80)
RDW: 17.9 % — ABNORMAL HIGH (ref 11.0–15.0)
Total Lymphocyte: 25 %
WBC: 8.5 10*3/uL (ref 3.8–10.8)

## 2023-09-18 LAB — TRIGLYCERIDES: Triglycerides: 42 mg/dL (ref <150)

## 2023-09-18 LAB — PHOSPHORUS: Phosphorus: 4.4 mg/dL (ref 2.5–4.5)

## 2023-09-18 MED ORDER — HYDROMORPHONE HCL 2 MG PO TABS
2.0000 mg | ORAL_TABLET | ORAL | 0 refills | Status: DC | PRN
  Filled 2023-09-18: qty 50, 5d supply, fill #0

## 2023-09-23 ENCOUNTER — Other Ambulatory Visit (HOSPITAL_COMMUNITY): Payer: Self-pay

## 2023-09-28 ENCOUNTER — Other Ambulatory Visit: Payer: Self-pay | Admitting: Emergency Medicine

## 2023-09-28 ENCOUNTER — Encounter: Payer: Medicaid Other | Admitting: *Deleted

## 2023-09-28 VITALS — BP 103/65 | HR 87 | Temp 98.2°F | Resp 16

## 2023-09-28 DIAGNOSIS — Z452 Encounter for adjustment and management of vascular access device: Secondary | ICD-10-CM

## 2023-09-28 DIAGNOSIS — E43 Unspecified severe protein-calorie malnutrition: Secondary | ICD-10-CM

## 2023-09-28 MED ORDER — HEPARIN SOD (PORK) LOCK FLUSH 100 UNIT/ML IV SOLN
250.0000 [IU] | Freq: Once | INTRAVENOUS | Status: AC | PRN
  Administered 2023-09-28: 250 [IU]

## 2023-09-28 NOTE — Progress Notes (Signed)
Diagnosis: PICC Line in Place  Provider: Rowan Blase, MD  Procedure: PICC Dressing Change  Labs Drawn, Line Flushed, and Dressing Changed  Discharge: Condition: Good, Destination: Home . AVS provided to patient.   Performed by:  Daleen Squibb, RN

## 2023-09-29 ENCOUNTER — Other Ambulatory Visit (HOSPITAL_COMMUNITY): Payer: Self-pay

## 2023-09-29 ENCOUNTER — Other Ambulatory Visit (HOSPITAL_BASED_OUTPATIENT_CLINIC_OR_DEPARTMENT_OTHER): Payer: Self-pay

## 2023-09-29 LAB — CBC WITH DIFFERENTIAL/PLATELET
Absolute Monocytes: 401 {cells}/uL (ref 200–950)
Basophils Absolute: 53 {cells}/uL (ref 0–200)
Basophils Relative: 0.6 %
Eosinophils Absolute: 89 {cells}/uL (ref 15–500)
Eosinophils Relative: 1 %
HCT: 27.1 % — ABNORMAL LOW (ref 38.5–50.0)
Hemoglobin: 7.7 g/dL — ABNORMAL LOW (ref 13.2–17.1)
Lymphs Abs: 2430 {cells}/uL (ref 850–3900)
MCH: 21.8 pg — ABNORMAL LOW (ref 27.0–33.0)
MCHC: 28.4 g/dL — ABNORMAL LOW (ref 32.0–36.0)
MCV: 76.8 fL — ABNORMAL LOW (ref 80.0–100.0)
MPV: 10.5 fL (ref 7.5–12.5)
Monocytes Relative: 4.5 %
Neutro Abs: 5927 {cells}/uL (ref 1500–7800)
Neutrophils Relative %: 66.6 %
Platelets: 321 10*3/uL (ref 140–400)
RBC: 3.53 10*6/uL — ABNORMAL LOW (ref 4.20–5.80)
RDW: 17.6 % — ABNORMAL HIGH (ref 11.0–15.0)
Total Lymphocyte: 27.3 %
WBC: 8.9 10*3/uL (ref 3.8–10.8)

## 2023-09-29 LAB — COMPREHENSIVE METABOLIC PANEL
AG Ratio: 1.1 (calc) (ref 1.0–2.5)
ALT: 9 U/L (ref 9–46)
AST: 13 U/L (ref 10–40)
Albumin: 3.9 g/dL (ref 3.6–5.1)
Alkaline phosphatase (APISO): 71 U/L (ref 36–130)
BUN: 24 mg/dL (ref 7–25)
CO2: 25 mmol/L (ref 20–32)
Calcium: 9.3 mg/dL (ref 8.6–10.3)
Chloride: 106 mmol/L (ref 98–110)
Creat: 0.9 mg/dL (ref 0.60–1.29)
Globulin: 3.6 g/dL (ref 1.9–3.7)
Glucose, Bld: 105 mg/dL — ABNORMAL HIGH (ref 65–99)
Potassium: 4.1 mmol/L (ref 3.5–5.3)
Sodium: 141 mmol/L (ref 135–146)
Total Bilirubin: 0.2 mg/dL (ref 0.2–1.2)
Total Protein: 7.5 g/dL (ref 6.1–8.1)

## 2023-09-29 LAB — PHOSPHORUS: Phosphorus: 4.5 mg/dL (ref 2.5–4.5)

## 2023-09-29 LAB — TRIGLYCERIDES: Triglycerides: 89 mg/dL (ref <150)

## 2023-09-29 LAB — MAGNESIUM: Magnesium: 1.9 mg/dL (ref 1.5–2.5)

## 2023-09-29 MED ORDER — CLONAZEPAM 1 MG PO TBDP
1.0000 mg | ORAL_TABLET | Freq: Every day | ORAL | 0 refills | Status: DC | PRN
  Filled 2023-09-29 (×2): qty 15, 30d supply, fill #0

## 2023-09-29 MED ORDER — BUPRENORPHINE HCL 8 MG SL SUBL
SUBLINGUAL_TABLET | SUBLINGUAL | 0 refills | Status: DC
  Filled 2023-09-29: qty 90, 30d supply, fill #0

## 2023-09-30 ENCOUNTER — Other Ambulatory Visit: Payer: Self-pay

## 2023-09-30 ENCOUNTER — Other Ambulatory Visit (HOSPITAL_COMMUNITY): Payer: Self-pay

## 2023-09-30 ENCOUNTER — Encounter: Payer: Self-pay | Admitting: Gastroenterology

## 2023-09-30 MED ORDER — HYDROMORPHONE HCL 2 MG PO TABS
2.0000 mg | ORAL_TABLET | ORAL | 0 refills | Status: DC | PRN
  Filled 2023-09-30: qty 50, 9d supply, fill #0

## 2023-10-01 ENCOUNTER — Other Ambulatory Visit: Payer: Self-pay | Admitting: Emergency Medicine

## 2023-10-05 ENCOUNTER — Other Ambulatory Visit: Payer: Self-pay | Admitting: Emergency Medicine

## 2023-10-05 ENCOUNTER — Encounter: Payer: Medicaid Other | Admitting: Emergency Medicine

## 2023-10-05 VITALS — BP 108/54 | HR 69 | Temp 98.2°F | Resp 18

## 2023-10-05 DIAGNOSIS — Z452 Encounter for adjustment and management of vascular access device: Secondary | ICD-10-CM

## 2023-10-05 DIAGNOSIS — E43 Unspecified severe protein-calorie malnutrition: Secondary | ICD-10-CM

## 2023-10-05 LAB — COMPREHENSIVE METABOLIC PANEL
AG Ratio: 1.1 (calc) (ref 1.0–2.5)
ALT: 12 U/L (ref 9–46)
AST: 15 U/L (ref 10–40)
Albumin: 4.1 g/dL (ref 3.6–5.1)
Alkaline phosphatase (APISO): 84 U/L (ref 36–130)
BUN: 20 mg/dL (ref 7–25)
CO2: 23 mmol/L (ref 20–32)
Calcium: 9.6 mg/dL (ref 8.6–10.3)
Chloride: 100 mmol/L (ref 98–110)
Creat: 0.75 mg/dL (ref 0.60–1.29)
Globulin: 3.6 g/dL (ref 1.9–3.7)
Glucose, Bld: 89 mg/dL (ref 65–99)
Potassium: 4.1 mmol/L (ref 3.5–5.3)
Sodium: 134 mmol/L — ABNORMAL LOW (ref 135–146)
Total Bilirubin: 0.3 mg/dL (ref 0.2–1.2)
Total Protein: 7.7 g/dL (ref 6.1–8.1)

## 2023-10-05 LAB — CBC WITH DIFFERENTIAL/PLATELET
Absolute Lymphocytes: 2244 {cells}/uL (ref 850–3900)
Absolute Monocytes: 414 {cells}/uL (ref 200–950)
Basophils Absolute: 53 {cells}/uL (ref 0–200)
Basophils Relative: 0.6 %
Eosinophils Absolute: 97 {cells}/uL (ref 15–500)
Eosinophils Relative: 1.1 %
HCT: 27.3 % — ABNORMAL LOW (ref 38.5–50.0)
Hemoglobin: 7.9 g/dL — ABNORMAL LOW (ref 13.2–17.1)
MCH: 22.1 pg — ABNORMAL LOW (ref 27.0–33.0)
MCHC: 28.9 g/dL — ABNORMAL LOW (ref 32.0–36.0)
MCV: 76.3 fL — ABNORMAL LOW (ref 80.0–100.0)
MPV: 10.4 fL (ref 7.5–12.5)
Monocytes Relative: 4.7 %
Neutro Abs: 5993 {cells}/uL (ref 1500–7800)
Neutrophils Relative %: 68.1 %
Platelets: 310 10*3/uL (ref 140–400)
RBC: 3.58 10*6/uL — ABNORMAL LOW (ref 4.20–5.80)
RDW: 17.6 % — ABNORMAL HIGH (ref 11.0–15.0)
Total Lymphocyte: 25.5 %
WBC: 8.8 10*3/uL (ref 3.8–10.8)

## 2023-10-05 LAB — MAGNESIUM: Magnesium: 1.8 mg/dL (ref 1.5–2.5)

## 2023-10-05 LAB — TRIGLYCERIDES: Triglycerides: 58 mg/dL (ref <150)

## 2023-10-05 LAB — PHOSPHORUS: Phosphorus: 4.8 mg/dL — ABNORMAL HIGH (ref 2.5–4.5)

## 2023-10-05 MED ORDER — SODIUM CHLORIDE 0.9% FLUSH
10.0000 mL | Freq: Once | INTRAVENOUS | Status: AC | PRN
  Administered 2023-10-05: 10 mL

## 2023-10-05 MED ORDER — HEPARIN SOD (PORK) LOCK FLUSH 100 UNIT/ML IV SOLN
250.0000 [IU] | Freq: Once | INTRAVENOUS | Status: AC | PRN
  Administered 2023-10-05: 250 [IU]

## 2023-10-05 NOTE — Progress Notes (Signed)
Alan Jennings presented for PICC line flush, labs, and dressing change.  See IV assessment in docflowsheets for PICC details.  Proper placement of PICC confirmed by CXR.  PICC located right arm.  Good blood return present. PICC flushed with 20ml NS and 250U Heparin, see MAR for further details.  Alan Jennings tolerated procedure well and without incident.

## 2023-10-09 ENCOUNTER — Encounter: Payer: Self-pay | Admitting: Gastroenterology

## 2023-10-09 ENCOUNTER — Other Ambulatory Visit (HOSPITAL_COMMUNITY): Payer: Self-pay

## 2023-10-09 MED ORDER — HYDROMORPHONE HCL 2 MG PO TABS
2.0000 mg | ORAL_TABLET | Freq: Four times a day (QID) | ORAL | 0 refills | Status: DC
Start: 2023-10-09 — End: 2024-02-10
  Filled 2023-10-09: qty 50, 7d supply, fill #0

## 2023-10-09 MED ORDER — SENNOSIDES 8.6 MG PO TABS
1.0000 | ORAL_TABLET | Freq: Every day | ORAL | 11 refills | Status: DC
  Filled 2023-10-09: qty 30, 30d supply, fill #0

## 2023-10-09 MED ORDER — METOCLOPRAMIDE HCL 10 MG PO TABS
10.0000 mg | ORAL_TABLET | Freq: Four times a day (QID) | ORAL | 1 refills | Status: DC | PRN
  Filled 2023-10-09: qty 60, 15d supply, fill #0

## 2023-10-09 MED ORDER — POLYETHYLENE GLYCOL 3350 17 GM/SCOOP PO POWD
ORAL | 0 refills | Status: DC
  Filled 2023-10-09: qty 238, 1d supply, fill #0

## 2023-10-13 ENCOUNTER — Other Ambulatory Visit: Payer: Self-pay | Admitting: Emergency Medicine

## 2023-10-13 VITALS — BP 118/64 | HR 71 | Temp 98.0°F | Resp 16

## 2023-10-13 DIAGNOSIS — Z452 Encounter for adjustment and management of vascular access device: Secondary | ICD-10-CM

## 2023-10-13 DIAGNOSIS — E43 Unspecified severe protein-calorie malnutrition: Secondary | ICD-10-CM

## 2023-10-13 MED ORDER — HEPARIN SOD (PORK) LOCK FLUSH 100 UNIT/ML IV SOLN
250.0000 [IU] | Freq: Once | INTRAVENOUS | Status: AC | PRN
  Administered 2023-10-13: 250 [IU]

## 2023-10-13 MED ORDER — SODIUM CHLORIDE 0.9% FLUSH
10.0000 mL | Freq: Once | INTRAVENOUS | Status: AC | PRN
Start: 2023-10-13 — End: 2023-10-13
  Administered 2023-10-13: 10 mL

## 2023-10-13 NOTE — Progress Notes (Signed)
Alan Jennings presented for PICC line flush and dressing change and blood draw.  See IV assessment in docflowsheets for PICC details.  Proper placement of PICC confirmed by CXR.  PICC located right arm.  Good blood return present. PICC flushed with 20ml NS and 250U Heparin, see MAR for further details.  Alan Jennings tolerated procedure well and without incident.

## 2023-10-14 LAB — COMPREHENSIVE METABOLIC PANEL
AG Ratio: 1.2 (calc) (ref 1.0–2.5)
ALT: 8 U/L — ABNORMAL LOW (ref 9–46)
AST: 13 U/L (ref 10–40)
Albumin: 4.3 g/dL (ref 3.6–5.1)
Alkaline phosphatase (APISO): 77 U/L (ref 36–130)
BUN: 14 mg/dL (ref 7–25)
CO2: 26 mmol/L (ref 20–32)
Calcium: 9.3 mg/dL (ref 8.6–10.3)
Chloride: 102 mmol/L (ref 98–110)
Creat: 0.72 mg/dL (ref 0.60–1.29)
Globulin: 3.5 g/dL (ref 1.9–3.7)
Glucose, Bld: 95 mg/dL (ref 65–99)
Potassium: 3.7 mmol/L (ref 3.5–5.3)
Sodium: 137 mmol/L (ref 135–146)
Total Bilirubin: 0.3 mg/dL (ref 0.2–1.2)
Total Protein: 7.8 g/dL (ref 6.1–8.1)

## 2023-10-14 LAB — CBC WITH DIFFERENTIAL/PLATELET
Absolute Lymphocytes: 2100 {cells}/uL (ref 850–3900)
Absolute Monocytes: 382 {cells}/uL (ref 200–950)
Basophils Absolute: 50 {cells}/uL (ref 0–200)
Basophils Relative: 0.6 %
Eosinophils Absolute: 66 {cells}/uL (ref 15–500)
Eosinophils Relative: 0.8 %
HCT: 27.6 % — ABNORMAL LOW (ref 38.5–50.0)
Hemoglobin: 7.9 g/dL — ABNORMAL LOW (ref 13.2–17.1)
MCH: 21.9 pg — ABNORMAL LOW (ref 27.0–33.0)
MCHC: 28.6 g/dL — ABNORMAL LOW (ref 32.0–36.0)
MCV: 76.5 fL — ABNORMAL LOW (ref 80.0–100.0)
MPV: 10.8 fL (ref 7.5–12.5)
Monocytes Relative: 4.6 %
Neutro Abs: 5702 {cells}/uL (ref 1500–7800)
Neutrophils Relative %: 68.7 %
Platelets: 375 10*3/uL (ref 140–400)
RBC: 3.61 10*6/uL — ABNORMAL LOW (ref 4.20–5.80)
RDW: 17.5 % — ABNORMAL HIGH (ref 11.0–15.0)
Total Lymphocyte: 25.3 %
WBC: 8.3 10*3/uL (ref 3.8–10.8)

## 2023-10-14 LAB — MAGNESIUM: Magnesium: 2 mg/dL (ref 1.5–2.5)

## 2023-10-14 LAB — TRIGLYCERIDES: Triglycerides: 65 mg/dL (ref <150)

## 2023-10-14 LAB — PHOSPHORUS: Phosphorus: 3.4 mg/dL (ref 2.5–4.5)

## 2023-10-19 ENCOUNTER — Encounter: Payer: Medicaid Other | Attending: General Surgery

## 2023-10-19 DIAGNOSIS — Z452 Encounter for adjustment and management of vascular access device: Secondary | ICD-10-CM | POA: Insufficient documentation

## 2023-10-19 DIAGNOSIS — E43 Unspecified severe protein-calorie malnutrition: Secondary | ICD-10-CM | POA: Insufficient documentation

## 2023-10-23 ENCOUNTER — Encounter: Payer: Self-pay | Admitting: Gastroenterology

## 2023-10-23 ENCOUNTER — Other Ambulatory Visit (HOSPITAL_COMMUNITY): Payer: Self-pay

## 2023-10-23 MED ORDER — HYDROMORPHONE HCL 2 MG PO TABS
2.0000 mg | ORAL_TABLET | Freq: Three times a day (TID) | ORAL | 0 refills | Status: DC | PRN
  Filled 2023-10-23: qty 50, 8d supply, fill #0

## 2023-10-26 ENCOUNTER — Encounter: Payer: Medicaid Other | Admitting: *Deleted

## 2023-10-26 ENCOUNTER — Other Ambulatory Visit: Payer: Self-pay | Admitting: Emergency Medicine

## 2023-10-26 VITALS — BP 103/59 | HR 67 | Temp 98.4°F | Resp 18

## 2023-10-26 DIAGNOSIS — Z452 Encounter for adjustment and management of vascular access device: Secondary | ICD-10-CM | POA: Diagnosis present

## 2023-10-26 DIAGNOSIS — E43 Unspecified severe protein-calorie malnutrition: Secondary | ICD-10-CM

## 2023-10-26 MED ORDER — SODIUM CHLORIDE 0.9% FLUSH: 10.0000 mL | Freq: Once | INTRAVENOUS | Status: DC | PRN

## 2023-10-26 MED ORDER — HEPARIN SOD (PORK) LOCK FLUSH 100 UNIT/ML IV SOLN
250.0000 [IU] | Freq: Once | INTRAVENOUS | Status: AC | PRN
  Administered 2023-10-26: 250 [IU]

## 2023-10-26 NOTE — Progress Notes (Addendum)
Diagnosis: PICC Line in Place  Provider: Almond Lint, MD  Procedure: PICC Dressing Change  Labs Drawn, Line Flushed, and Dressing Changed  Discharge: Condition: Good, Destination: Home . AVS provided to patient.   Performed by:  Daleen Squibb, RN

## 2023-10-27 ENCOUNTER — Other Ambulatory Visit (HOSPITAL_COMMUNITY): Payer: Self-pay

## 2023-10-27 ENCOUNTER — Other Ambulatory Visit (HOSPITAL_BASED_OUTPATIENT_CLINIC_OR_DEPARTMENT_OTHER): Payer: Self-pay

## 2023-10-27 LAB — CBC WITH DIFFERENTIAL/PLATELET
Absolute Lymphocytes: 1548 {cells}/uL (ref 850–3900)
Absolute Monocytes: 270 {cells}/uL (ref 200–950)
Basophils Absolute: 28 {cells}/uL (ref 0–200)
Basophils Relative: 0.4 %
Eosinophils Absolute: 28 {cells}/uL (ref 15–500)
Eosinophils Relative: 0.4 %
HCT: 26.1 % — ABNORMAL LOW (ref 38.5–50.0)
Hemoglobin: 7.5 g/dL — ABNORMAL LOW (ref 13.2–17.1)
MCH: 21.8 pg — ABNORMAL LOW (ref 27.0–33.0)
MCHC: 28.7 g/dL — ABNORMAL LOW (ref 32.0–36.0)
MCV: 75.9 fL — ABNORMAL LOW (ref 80.0–100.0)
MPV: 10.5 fL (ref 7.5–12.5)
Monocytes Relative: 3.8 %
Neutro Abs: 5226 {cells}/uL (ref 1500–7800)
Neutrophils Relative %: 73.6 %
Platelets: 379 Thousand/uL (ref 140–400)
RBC: 3.44 Million/uL — ABNORMAL LOW (ref 4.20–5.80)
RDW: 16.9 % — ABNORMAL HIGH (ref 11.0–15.0)
Total Lymphocyte: 21.8 %
WBC: 7.1 Thousand/uL (ref 3.8–10.8)

## 2023-10-27 LAB — MAGNESIUM: Magnesium: 2.1 mg/dL (ref 1.5–2.5)

## 2023-10-27 LAB — COMPREHENSIVE METABOLIC PANEL WITH GFR
AG Ratio: 1.3 (calc) (ref 1.0–2.5)
ALT: 8 U/L — ABNORMAL LOW (ref 9–46)
AST: 12 U/L (ref 10–40)
Albumin: 4.1 g/dL (ref 3.6–5.1)
Alkaline phosphatase (APISO): 74 U/L (ref 36–130)
BUN: 11 mg/dL (ref 7–25)
CO2: 28 mmol/L (ref 20–32)
Calcium: 9.1 mg/dL (ref 8.6–10.3)
Chloride: 103 mmol/L (ref 98–110)
Creat: 0.78 mg/dL (ref 0.60–1.29)
Globulin: 3.2 g/dL (ref 1.9–3.7)
Glucose, Bld: 91 mg/dL (ref 65–99)
Potassium: 3.6 mmol/L (ref 3.5–5.3)
Sodium: 138 mmol/L (ref 135–146)
Total Bilirubin: 0.2 mg/dL (ref 0.2–1.2)
Total Protein: 7.3 g/dL (ref 6.1–8.1)

## 2023-10-27 LAB — TRIGLYCERIDES: Triglycerides: 70 mg/dL (ref <150)

## 2023-10-27 LAB — PHOSPHORUS: Phosphorus: 3.5 mg/dL (ref 2.5–4.5)

## 2023-10-27 MED ORDER — BUPRENORPHINE HCL 8 MG SL SUBL
8.0000 mg | SUBLINGUAL_TABLET | Freq: Three times a day (TID) | SUBLINGUAL | 0 refills | Status: DC
  Filled 2023-10-27: qty 90, 30d supply, fill #0

## 2023-10-27 MED ORDER — QUETIAPINE FUMARATE 25 MG PO TABS
25.0000 mg | ORAL_TABLET | Freq: Every day | ORAL | 0 refills | Status: AC | PRN
  Filled 2023-10-27: qty 30, 30d supply, fill #0

## 2023-10-27 MED ORDER — LAMOTRIGINE 200 MG PO TABS
200.0000 mg | ORAL_TABLET | Freq: Every day | ORAL | 0 refills | Status: DC
  Filled 2023-10-27 (×2): qty 30, 30d supply, fill #0

## 2023-10-27 MED ORDER — CLONAZEPAM 1 MG PO TBDP
1.0000 mg | ORAL_TABLET | Freq: Every day | ORAL | 0 refills | Status: DC
  Filled 2023-10-27: qty 15, 30d supply, fill #0
  Filled 2023-10-27: qty 15, 15d supply, fill #0

## 2023-11-03 ENCOUNTER — Other Ambulatory Visit (HOSPITAL_COMMUNITY): Payer: Self-pay

## 2023-11-08 NOTE — Progress Notes (Deleted)
Referring Provider: Vertis Kelch, NP Primary Care Physician:  Vertis Kelch, NP Primary GI Physician: Dr. Levon Hedger  No chief complaint on file.   HPI:   Alan Jennings is a 42 y.o. 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 [name]. Postop course has been complicated by ongoing abdominal pain, weight loss, abdominal distention, inability to advance to regular diet. Ultimately started on TPN in mid July.   I saw patient in the office 08/28/2023 at the request of Dr. [name] for ongoing abdominal pain.  We discussed several different concerns at that office visit:  He reported lower abdominal pain since robotic assisted gastrectomy in July, but seemed to be worsening somewhat.  Suspected this was multifactorial in the setting of surgery/healing process, adhesions, constipation, gas pain, but unable to rule out acute process and recommended CT A/P with contrast for further evaluation.  Also with ongoing postprandial epigastric burning, most severe when laying down at night.  The symptoms were present prior to gastrectomy in the setting of duodenal ulcers, but have persisted since his surgery.  Noted relief when opening G-tube and draining gastric contents.  Also with breakthrough GERD a couple times a week.  Denied NSAIDs.  Recommended increasing pantoprazole to 40 mg twice daily and starting Carafate 4 times daily.  Likely proceed with EGD pending CT.  Also with chronic gas pain, but worse since gastrectomy.  Suspected this could be related to lactose intolerance.  Recommended looking for other protein shakes/nutritional supplements that were lactose-free or take Lactaid tablets prior to dairy  consumption.   Also with constipation with bowels moving every 3 to 4 days, not currently taking anything for this.  Recommended MiraLAX daily.  CT completed 09/17/2023 with no acute findings, moderate stool burden.  Dr. [name] (surgeon) had also reviewed CT and advised for the patient to start MiraLAX and senna daily.  She also stated she was not sure why patient was having so much lower abdominal pain and asked if we can get him back in the office.   I spoke with patient on 10/30.  He reported MiraLAX and senna were working very well for him, having good bowel movements on a regular basis.  Noted lower abdominal pain only with bending over or pressing on the area and stated the pain only started after surgery and that it was right at his incision sites.  Suspected this was secondary to abdominal wall pain, but offered to get him back in the office to discuss further.  Patient was agreeable to this and stated that he was feeling much better overall, eating better, bending his gastrostomy much less frequently, and was gaining weight.      Past Medical History:  Diagnosis Date   Abdominal pain, chronic, epigastric    Anxiety    Arthritis    Back pain    Complication of anesthesia    had a syncopal episode after rhinoplasty   Depression    GERD (gastroesophageal reflux disease)    GI bleed    Headache    History of kidney stones    Hypertension    Neurogenic pain    Peptic ulcer disease    PTSD (post-traumatic stress disorder)    Seizures (HCC)    Seizures (  [facility])     Past Surgical History:  Procedure Laterality Date   BIOPSY  06/25/2018   Procedure: BIOPSY;  Surgeon: Malissa Hippo, MD;  Location: [facility];  Service: Endoscopy;;  gastric   BIOPSY  05/21/2023   Procedure: BIOPSY;  Surgeon: Lynann Bologna, DO;  Location: [facility];  Service: Gastroenterology;;   ESOPHAGOGASTRODUODENOSCOPY N/A 05/21/2023   Procedure: ESOPHAGOGASTRODUODENOSCOPY (EGD);  Surgeon: Lynann Bologna, DO;  Location: [facility];  Service: Gastroenterology;  Laterality: N/A;   ESOPHAGOGASTRODUODENOSCOPY (EGD) WITH PROPOFOL N/A 06/25/2018   Procedure: ESOPHAGOGASTRODUODENOSCOPY (EGD) WITH PROPOFOL;  Surgeon: Malissa Hippo, MD;  Location: [facility];  Service: Endoscopy;  Laterality: N/A;   ESOPHAGOGASTRODUODENOSCOPY (EGD) WITH PROPOFOL N/A 05/13/2022   Procedure: ESOPHAGOGASTRODUODENOSCOPY (EGD) WITH PROPOFOL;  Surgeon: Dolores Frame, MD;  Location: [facility];  Service: Gastroenterology;  Laterality: N/A;   GASTROSTOMY  06/17/2023   Procedure: INSERTION OF GASTROSTOMY TUBE;  Surgeon: Almond Lint, MD;  Location: [facility] OR;  Service: General;;   NOSE SURGERY     [facility]   ORIF HUMERUS FRACTURE  04/26/2012   Procedure: OPEN REDUCTION INTERNAL FIXATION (ORIF) PROXIMAL HUMERUS FRACTURE;  Surgeon: Vickki Hearing, MD;  Location: [facility];  Service: Orthopedics;  Laterality: Left;  Open Reduction Internal Fixation of Left Proximal Humerus Fracture, Closing Wedge Osteotomy, Bone Graft   RHINOPLASTY  2003   [facility]   SHOULDER SURGERY Left 2013    Current Outpatient Medications  Medication Sig Dispense Refill   acetaminophen (TYLENOL) 500 MG tablet Take 500-1,000 mg by mouth every 6 (six) hours as needed (for headaches).     buprenorphine (SUBUTEX) 8 MG SUBL SL tablet Dissolve 1 tablet in mouth 3 times daily 90 tablet 0   calcium carbonate (TUMS - DOSED IN MG ELEMENTAL CALCIUM) 500 MG chewable tablet Chew 1 tablet by mouth daily as needed for indigestion or heartburn.     clonazePAM (KLONOPIN) 1 MG disintegrating tablet Take 1 tablet (1 mg total) by mouth daily as needed for severe anxiety. (This is a 1 month supply) 15 tablet 0   clonazePAM (KLONOPIN) 1 MG disintegrating tablet Take 1 tablet (1 mg total) by mouth daily as needed for severe anxiety. 15 tablet 0   clonazePAM (KLONOPIN) 1 MG disintegrating tablet Disolve 1 tablet (1 mg total) by mouth daily as needed for severe  anxiety.( This is a 1 month supply ) 15 tablet 0   clonazePAM (KLONOPIN) 1 MG disintegrating tablet Dissolve 1 tablet in mouth once daily as needed for severe anxiety (this is a 86-month supply) 15 tablet 0   HYDROmorphone (DILAUDID) 2 MG tablet Take 1-2 tablets (2-4 mg total) by mouth every 4 (four) hours as needed for pain 50 tablet 0   HYDROmorphone (DILAUDID) 2 MG tablet Take 1-2 tablets (2-4 mg total) by mouth every 4 (four) hours as needed for pain. 50 tablet 0   HYDROmorphone (DILAUDID) 2 MG tablet Take 1-2 tablets (2-4 mg total) by mouth every 4 (four) hours as needed for pain 50 tablet 0   HYDROmorphone (DILAUDID) 2 MG tablet Take 1-2 tablets by mouth every 4 (four) hours as needed for pain 50 tablet 0   HYDROmorphone (DILAUDID) 2 MG tablet Take 1-2 tablets (2-4 mg total) by mouth every 6 (six) hours. 50 tablet 0   HYDROmorphone (DILAUDID) 2 MG tablet Take 1-2 tablets (2-4 mg total) by mouth every 8 (eight) hours as needed for pain. 50 tablet 0   lamoTRIgine (LAMICTAL) 200 MG  tablet Take 1 tablet (200 mg total) by mouth daily. 30 tablet 0   metoCLOPramide (REGLAN) 10 MG tablet Take 1 tablet (10 mg total) by mouth 4 (four) times daily as needed (Patient not taking: Reported on 08/28/2023) 60 tablet 1   metoCLOPramide (REGLAN) 10 MG tablet Take 1 tablet (10 mg total) by mouth 4 (four) times daily as needed. 60 tablet 1   ondansetron (ZOFRAN-ODT) 4 MG disintegrating tablet Dissolve 1 tablet in mouth every 6 (six) hours as needed for nausea. 20 tablet 0   pantoprazole (PROTONIX) 40 MG tablet Take 1 tablet (40 mg total) by mouth daily. 60 tablet 3   polyethylene glycol (MIRALAX / GLYCOLAX) 17 g packet Take 17 g by mouth daily. (Patient not taking: Reported on 08/28/2023) 14 each 0   polyethylene glycol powder (GLYCOLAX/MIRALAX) 17 GM/SCOOP powder Take 17 g by mouth 2 (two) times daily Take according to your procedure prep instructions. 238 g 0   QUEtiapine (SEROQUEL) 25 MG tablet Take 1 tablet (25 mg  total) by mouth daily as needed for insomnia. 30 tablet 0   senna (SENOKOT) 8.6 MG tablet Take 1 tablet by mouth once daily 30 tablet 11   sucralfate (CARAFATE) 1 g tablet Take 1 tablet (1 g total) by mouth 4 (four) times daily. 120 tablet 3   No current facility-administered medications for this visit.    Allergies as of 11/09/2023 - Review Complete 08/28/2023  Allergen Reaction Noted   Tramadol Other (See Comments)    Nsaids Other (See Comments) 09/19/2018    Family History  Problem Relation Age of Onset   Heart disease Other    Arthritis Other    Anesthesia problems Neg Hx    Hypotension Neg Hx    Malignant hyperthermia Neg Hx    Pseudochol deficiency Neg Hx     Social History   Socioeconomic History   Marital status: Single    Spouse name: Not on file   Number of children: Not on file   Years of education: college   Highest education level: Not on file  Occupational History   Occupation: none    Employer: SELF EMPLOYED  Tobacco Use   Smoking status: Former    Current packs/day: 0.00    Types: E-cigarettes, Cigarettes    Quit date: 2019    Years since quitting: 5.9   Smokeless tobacco: Never  Vaping Use   Vaping status: Every Day  Substance and Sexual Activity   Alcohol use: No   Drug use: Not Currently    Types: Marijuana   Sexual activity: Not Currently  Other Topics Concern   Not on file  Social History Narrative   Not on file   Social Determinants of Health   Financial Resource Strain: Low Risk  (08/27/2023)   Received [facility]   Overall Financial Resource Strain (CARDIA)    Difficulty of Paying Living Expenses: Not hard at all  Food Insecurity: No Food Insecurity (08/27/2023)   Received from [facility]   Hunger Vital Sign    Worried About Running Out of Food in the Last Year: Never true    Ran Out of Food in the Last Year: Never true  Transportation Needs: No Transportation Needs (06/17/2023)   PRAPARE - Scientist, research (physical sciences) (Medical): No    Lack of Transportation (Non-Medical): No  Physical Activity: Sufficiently Active (08/27/2023)   Received from [facility]   Exercise Vital Sign  Days of Exercise per Week: 7 days    Minutes of Exercise per Session: 30 min  Stress: Stress Concern Present (08/27/2023)   Received from [facility] - Occupational Stress Questionnaire    Feeling of Stress : To some extent  Social Connections: Moderately Isolated (08/27/2023)   Received from [facility]   Social Connection and Isolation Panel [NHANES]    Frequency of Communication with Friends and Family: More than three times a week    Frequency of Social Gatherings with Friends and Family: Once a week    Attends Religious Services: More than 4 times per year    Active Member of [employer] or Organizations: No    Attends Banker Meetings: Never    Marital Status: Never married    Review of Systems: Gen: Denies fever, chills, anorexia. Denies fatigue, weakness, weight loss.  CV: Denies chest pain, palpitations, syncope, peripheral edema, and claudication. Resp: Denies dyspnea at rest, cough, wheezing, coughing up blood, and pleurisy. GI: Denies vomiting blood, jaundice, and fecal incontinence.   Denies dysphagia or odynophagia. Derm: Denies rash, itching, dry skin Psych: Denies depression, anxiety, memory loss, confusion. No homicidal or suicidal ideation.  Heme: Denies bruising, bleeding, and enlarged lymph nodes.  Physical Exam: There were no vitals taken for this visit. General:   Alert and oriented. No distress noted. Pleasant and cooperative.  Head:  Normocephalic and atraumatic. Eyes:  Conjuctiva clear without scleral icterus. Heart:  S1, S2 present without murmurs appreciated. Lungs:  Clear to auscultation bilaterally. No wheezes, rales, or rhonchi. No distress.  Abdomen:  +BS, soft, non-tender and non-distended. No rebound or guarding.  No HSM or masses noted. Msk:  Symmetrical without gross deformities. Normal posture. Extremities:  Without edema. Neurologic:  Alert and  oriented x4 Psych:  Normal mood and affect.    Assessment:     Plan:  ***   Ermalinda Memos, PA-C [facility] 11/09/2023

## 2023-11-09 ENCOUNTER — Ambulatory Visit: Payer: Medicaid Other | Admitting: Gastroenterology

## 2023-11-09 ENCOUNTER — Encounter: Payer: Self-pay | Admitting: Gastroenterology

## 2023-11-10 ENCOUNTER — Other Ambulatory Visit: Payer: Self-pay | Admitting: Emergency Medicine

## 2023-11-10 ENCOUNTER — Telehealth: Payer: Self-pay | Admitting: Emergency Medicine

## 2023-11-10 VITALS — BP 110/44 | HR 73 | Temp 98.3°F | Resp 18

## 2023-11-10 DIAGNOSIS — E43 Unspecified severe protein-calorie malnutrition: Secondary | ICD-10-CM

## 2023-11-10 DIAGNOSIS — Z452 Encounter for adjustment and management of vascular access device: Secondary | ICD-10-CM

## 2023-11-10 MED ORDER — SODIUM CHLORIDE 0.9% FLUSH
10.0000 mL | Freq: Once | INTRAVENOUS | Status: AC | PRN
  Administered 2023-11-10: 10 mL

## 2023-11-10 MED ORDER — HEPARIN SOD (PORK) LOCK FLUSH 100 UNIT/ML IV SOLN
250.0000 [IU] | Freq: Once | INTRAVENOUS | Status: AC | PRN
  Administered 2023-11-10: 250 [IU]

## 2023-11-10 NOTE — Telephone Encounter (Signed)
Spoke with [facility] with [facility] triage nurse about no blood return from PICC line this am and about 8 cm from original insertion mark. Pt informed not to use the PICC line for anything.  Pt verbalized understanding.  Hadelyn will notify doctor and they will follow up with patient.

## 2023-11-10 NOTE — Progress Notes (Addendum)
Diagnosis: PICC Line in Place  Provider:  [name], MD  Procedure: PICC Dressing Change  Dressing change, Line flushed  Picc out at 8 cm, very little blood return  Discharge: Condition: Good, Destination: Home . AVS provided to patient.   Performed by:  Daleen Squibb, RN

## 2023-11-11 ENCOUNTER — Other Ambulatory Visit (HOSPITAL_COMMUNITY): Payer: Medicaid Other

## 2023-11-11 ENCOUNTER — Other Ambulatory Visit (HOSPITAL_COMMUNITY): Payer: Self-pay | Admitting: General Surgery

## 2023-11-11 DIAGNOSIS — Z452 Encounter for adjustment and management of vascular access device: Secondary | ICD-10-CM

## 2023-11-11 LAB — COMPREHENSIVE METABOLIC PANEL
AG Ratio: 1.3 (calc) (ref 1.0–2.5)
ALT: 9 U/L (ref 9–46)
AST: 12 U/L (ref 10–40)
Albumin: 4.2 g/dL (ref 3.6–5.1)
Alkaline phosphatase (APISO): 76 U/L (ref 36–130)
BUN: 11 mg/dL (ref 7–25)
CO2: 30 mmol/L (ref 20–32)
Calcium: 9.3 mg/dL (ref 8.6–10.3)
Chloride: 104 mmol/L (ref 98–110)
Creat: 0.89 mg/dL (ref 0.60–1.29)
Globulin: 3.3 g/dL (ref 1.9–3.7)
Glucose, Bld: 97 mg/dL (ref 65–99)
Potassium: 3.7 mmol/L (ref 3.5–5.3)
Sodium: 142 mmol/L (ref 135–146)
Total Bilirubin: 0.3 mg/dL (ref 0.2–1.2)
Total Protein: 7.5 g/dL (ref 6.1–8.1)

## 2023-11-11 LAB — CBC WITH DIFFERENTIAL/PLATELET
Absolute Lymphocytes: 1280 {cells}/uL (ref 850–3900)
Absolute Monocytes: 243 {cells}/uL (ref 200–950)
Basophils Absolute: 32 {cells}/uL (ref 0–200)
Basophils Relative: 0.5 %
Eosinophils Absolute: 38 {cells}/uL (ref 15–500)
Eosinophils Relative: 0.6 %
HCT: 26 % — ABNORMAL LOW (ref 38.5–50.0)
Hemoglobin: 7.5 g/dL — ABNORMAL LOW (ref 13.2–17.1)
MCH: 21.6 pg — ABNORMAL LOW (ref 27.0–33.0)
MCHC: 28.8 g/dL — ABNORMAL LOW (ref 32.0–36.0)
MCV: 74.7 fL — ABNORMAL LOW (ref 80.0–100.0)
MPV: 10.5 fL (ref 7.5–12.5)
Monocytes Relative: 3.8 %
Neutro Abs: 4806 {cells}/uL (ref 1500–7800)
Neutrophils Relative %: 75.1 %
Platelets: 306 10*3/uL (ref 140–400)
RBC: 3.48 10*6/uL — ABNORMAL LOW (ref 4.20–5.80)
RDW: 16.3 % — ABNORMAL HIGH (ref 11.0–15.0)
Total Lymphocyte: 20 %
WBC: 6.4 10*3/uL (ref 3.8–10.8)

## 2023-11-11 LAB — TRIGLYCERIDES: Triglycerides: 80 mg/dL (ref <150)

## 2023-11-11 LAB — PHOSPHORUS: Phosphorus: 3.8 mg/dL (ref 2.5–4.5)

## 2023-11-11 LAB — MAGNESIUM: Magnesium: 2.2 mg/dL (ref 1.5–2.5)

## 2023-11-13 ENCOUNTER — Inpatient Hospital Stay (HOSPITAL_COMMUNITY)
Admission: RE | Admit: 2023-11-13 | Discharge: 2023-11-13 | Disposition: A | Discharge status: Home / Self Care | Payer: Medicaid Other | Source: Ambulatory Visit | Attending: General Surgery | Admitting: General Surgery

## 2023-11-16 ENCOUNTER — Encounter: Payer: Self-pay | Admitting: Gastroenterology

## 2023-11-16 ENCOUNTER — Encounter: Payer: Medicaid Other | Attending: Nurse Practitioner

## 2023-11-16 ENCOUNTER — Other Ambulatory Visit (HOSPITAL_COMMUNITY): Payer: Self-pay

## 2023-11-16 DIAGNOSIS — Z452 Encounter for adjustment and management of vascular access device: Secondary | ICD-10-CM | POA: Insufficient documentation

## 2023-11-16 MED ORDER — HYDROMORPHONE HCL 2 MG PO TABS
ORAL_TABLET | ORAL | 0 refills | Status: DC
  Filled 2023-11-16: qty 50, 8d supply, fill #0

## 2023-11-19 ENCOUNTER — Encounter: Payer: Medicaid Other | Admitting: Emergency Medicine

## 2023-11-19 VITALS — BP 108/63 | HR 70 | Temp 98.1°F | Resp 18

## 2023-11-19 DIAGNOSIS — Z452 Encounter for adjustment and management of vascular access device: Secondary | ICD-10-CM

## 2023-11-19 NOTE — Progress Notes (Signed)
PICC Removal Note: S: 36cm removed, catheter intact O: PICC line removed from right antecubital after sterile site prepped per protocol. PICC catheter tip visualized and intact. Pressure dressing applied with vaseline gauze and coban tape. A: No redness, ecchymosis, edema, swelling, or drainage noted at site. P: Instructions provided on post PICC discharge care, including followup notification instructions.

## 2023-11-24 ENCOUNTER — Other Ambulatory Visit (HOSPITAL_COMMUNITY): Payer: Self-pay

## 2023-11-24 MED ORDER — CLONAZEPAM 1 MG PO TBDP
1.0000 mg | ORAL_TABLET | Freq: Every day | ORAL | 0 refills | Status: DC | PRN
  Filled 2023-11-24: qty 15, 30d supply, fill #0

## 2023-11-24 MED ORDER — BUPRENORPHINE HCL 8 MG SL SUBL
8.0000 mg | SUBLINGUAL_TABLET | Freq: Three times a day (TID) | SUBLINGUAL | 0 refills | Status: DC
  Filled 2023-11-24: qty 90, 30d supply, fill #0

## 2023-11-25 ENCOUNTER — Other Ambulatory Visit (HOSPITAL_COMMUNITY): Payer: Self-pay

## 2023-11-25 ENCOUNTER — Encounter (HOSPITAL_COMMUNITY): Payer: Self-pay

## 2023-11-25 ENCOUNTER — Ambulatory Visit (HOSPITAL_COMMUNITY): Payer: Medicaid Other

## 2023-11-26 ENCOUNTER — Other Ambulatory Visit (HOSPITAL_COMMUNITY): Payer: Self-pay

## 2023-12-21 ENCOUNTER — Other Ambulatory Visit (HOSPITAL_COMMUNITY): Payer: Self-pay

## 2023-12-21 MED ORDER — BUPRENORPHINE HCL-NALOXONE HCL 8-2 MG SL FILM
1.0000 | ORAL_FILM | Freq: Three times a day (TID) | SUBLINGUAL | 0 refills | Status: DC
  Filled 2023-12-21: qty 90, 30d supply, fill #0

## 2023-12-21 MED ORDER — BUPRENORPHINE HCL-NALOXONE HCL 8-2 MG SL FILM
1.0000 | ORAL_FILM | Freq: Three times a day (TID) | SUBLINGUAL | 0 refills | Status: DC
  Filled 2024-02-14: qty 90, 30d supply, fill #0

## 2023-12-21 MED ORDER — CLONAZEPAM 1 MG PO TBDP
1.0000 mg | ORAL_TABLET | Freq: Every day | ORAL | 0 refills | Status: DC | PRN
  Filled 2023-12-21: qty 20, 20d supply, fill #0
  Filled 2023-12-21: qty 20, 30d supply, fill #0
  Filled ????-??-??: fill #0

## 2023-12-21 MED ORDER — LAMOTRIGINE 200 MG PO TABS
200.0000 mg | ORAL_TABLET | Freq: Every day | ORAL | 0 refills | Status: DC
  Filled 2023-12-21: qty 90, 90d supply, fill #0

## 2023-12-23 ENCOUNTER — Other Ambulatory Visit (HOSPITAL_COMMUNITY): Payer: Self-pay

## 2023-12-23 MED ORDER — CLONAZEPAM 1 MG PO TBDP
1.0000 mg | ORAL_TABLET | Freq: Every day | ORAL | 0 refills | Status: DC | PRN
  Filled 2023-12-23: qty 20, 30d supply, fill #0

## 2024-01-05 ENCOUNTER — Other Ambulatory Visit: Payer: Self-pay | Admitting: General Surgery

## 2024-01-05 DIAGNOSIS — G8928 Other chronic postprocedural pain: Secondary | ICD-10-CM

## 2024-01-05 DIAGNOSIS — K30 Functional dyspepsia: Secondary | ICD-10-CM

## 2024-01-18 ENCOUNTER — Other Ambulatory Visit (HOSPITAL_COMMUNITY): Payer: Self-pay

## 2024-01-18 ENCOUNTER — Other Ambulatory Visit: Payer: Self-pay

## 2024-01-18 MED ORDER — CLONAZEPAM 1 MG PO TABS
1.0000 mg | ORAL_TABLET | Freq: Every day | ORAL | 0 refills | Status: AC
  Filled 2024-01-18: qty 15, 18d supply, fill #0
  Filled 2024-01-18: qty 5, 2d supply, fill #0

## 2024-01-18 MED ORDER — BUPRENORPHINE HCL 8 MG SL SUBL
8.0000 mg | SUBLINGUAL_TABLET | Freq: Three times a day (TID) | SUBLINGUAL | 0 refills | Status: DC
  Filled 2024-01-18: qty 90, 30d supply, fill #0

## 2024-01-26 ENCOUNTER — Encounter: Payer: Self-pay | Admitting: Gastroenterology

## 2024-02-06 NOTE — Progress Notes (Unsigned)
 Referring Provider: Vertis Kelch, NP Primary Care Physician:  Vertis Kelch, NP Primary GI Physician: Dr. Levon Jennings  Chief Complaint  Patient presents with   Follow-up    HPI:   Alan Jennings is a 43 y.o. 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 Donell Jennings. Postop course complicated by ongoing abdominal pain, weight loss, abdominal distention, inability to advance to regular diet. Ultimately started on TPN in mid July 2024.    I saw patient in the office 08/28/2023 at the request of Dr. Donell Jennings for ongoing abdominal pain.  We discussed several different concerns at that office visit:   He reported lower abdominal pain since robotic assisted gastrectomy in July, but seemed to be worsening somewhat.  Suspected this was multifactorial in the setting of surgery/healing process, adhesions, constipation, gas pain, but unable to rule out acute process and recommended CT A/P with contrast for further evaluation.   Also with ongoing postprandial epigastric burning, most severe when laying down at night.  The symptoms were present prior to gastrectomy in the setting of duodenal ulcers, but have persisted since his surgery.  Noted relief when opening G-tube and draining gastric contents.  Also with breakthrough GERD a couple times a week.  Denied NSAIDs.  Recommended increasing pantoprazole to 40 mg twice daily and starting Carafate 4 times daily.  Likely proceed with EGD pending CT.   Also with chronic gas pain, but worse since gastrectomy.  Suspected this could be related to lactose intolerance.  Recommended looking for other protein shakes/nutritional supplements that were lactose-free or take Lactaid  tablets prior to dairy consumption.    Also with constipation with bowels moving every 3 to 4 days, not currently taking anything for this.  Recommended MiraLAX daily.   CT completed 09/17/2023 with no acute findings, moderate stool burden.  Dr. Donell Jennings (surgeon) had also reviewed CT and advised for the patient to start MiraLAX and senna daily.  She also stated she was not sure why patient was having so much lower abdominal pain and asked if we can get him back in the office.    I spoke with patient on 10/30.  He reported MiraLAX and senna were working very well for him, having good bowel movements on a regular basis.  Noted lower abdominal pain only with bending over or pressing on the area and stated the pain only started after surgery and that it was right at his incision sites.  Suspected this was secondary to abdominal wall pain, but offered to get him back in the office to discuss further.  Patient was agreeable to this and stated that he was feeling much better overall, eating better, bending his gastrostomy much less frequently, and was gaining weight.  We received a message from Dr. Donell Jennings that patient was having ongoing postprandial abdominal pain and asked that we see him back in the office.   Today: Feels a knotted sensation in the epigastric area after eating.  Gets a lot of nausea. No vomiting. Will take 2-4 hours before symptoms improve.  States the symptoms started pretty consistently for the last 2 to 3 weeks.  Symptoms occur regardless of what he eats.  Overall, he can eat a day 7 of food at 1 time.  No relatively satiety, but does get bloated after eating.  No heartburn or reflux.  No dysphagia.  Has been off pantoprazole for the last couple of months, but also having some upper abdominal discomfort and nausea while on pantoprazole twice a day.  Constantly thirsty.  Notes blood sugars are popping up after meals to the 180s to low 200s.   Gets sweaty and feels weird in his head.  Will  have heart palpitations.    Bowels moving well, but then states he may skip a day or two between bowel movements.  Has MiraLAX to use as needed.  No brbpr or melena.   No NSAIDs.      Past Medical History:  Diagnosis Date   Abdominal pain, chronic, epigastric    Anxiety    Arthritis    Back pain    Complication of anesthesia    had a syncopal episode after rhinoplasty   Depression    GERD (gastroesophageal reflux disease)    GI bleed    Headache    History of kidney stones    Hypertension    Neurogenic pain    Peptic ulcer disease    PTSD (post-traumatic stress disorder)    Seizures (HCC)    Seizures (HCC)     Past Surgical History:  Procedure Laterality Date   BIOPSY  06/25/2018   Procedure: BIOPSY;  Surgeon: Malissa Hippo, MD;  [facility];  Service: Endoscopy;;  gastric   BIOPSY  05/21/2023   Procedure: BIOPSY;  Surgeon: Lynann Bologna, DO;  Location: [facility];  Service: Gastroenterology;;   ESOPHAGOGASTRODUODENOSCOPY N/A 05/21/2023   Procedure: ESOPHAGOGASTRODUODENOSCOPY (EGD);  Surgeon: Lynann Bologna, DO;  Location: [facility];  Service: Gastroenterology;  Laterality: N/A;   ESOPHAGOGASTRODUODENOSCOPY (EGD) WITH PROPOFOL N/A 06/25/2018   Procedure: ESOPHAGOGASTRODUODENOSCOPY (EGD) WITH PROPOFOL;  Surgeon: Malissa Hippo, MD;  [facility];  Service: Endoscopy;  Laterality: N/A;   ESOPHAGOGASTRODUODENOSCOPY (EGD) WITH PROPOFOL N/A 05/13/2022   Procedure: ESOPHAGOGASTRODUODENOSCOPY (EGD) WITH PROPOFOL;  Surgeon: Dolores Frame, MD;  [facility];  Service: Gastroenterology;  Laterality: N/A;   GASTROSTOMY  06/17/2023   Procedure: INSERTION OF GASTROSTOMY TUBE;  Surgeon: Almond Lint, MD;  Location: [facility] OR;  Service: General;;   NOSE SURGERY     [facility]   ORIF HUMERUS FRACTURE  04/26/2012   Procedure: OPEN REDUCTION INTERNAL FIXATION (ORIF) PROXIMAL HUMERUS FRACTURE;  Surgeon: Vickki Hearing, MD;   Location: [facility];  Service: Orthopedics;  Laterality: Left;  Open Reduction Internal Fixation of Left Proximal Humerus Fracture, Closing Wedge Osteotomy, Bone Graft   RH[facility]LASTY  2003   [facility]   SHOULDER SURGERY Left 2013    Current Outpatient Medications  Medication Sig Dispense Refill   Buprenorphine HCl-Naloxone HCl (SUBOXONE) 8-2 MG FILM Take 1 Film by mouth 3 (three) times daily. 90 Film 0   clonazePAM (KLONOPIN) 1 MG tablet Take 1 tablet (1 mg total) by mouth daily as directed. *this is 30 day supply* 20 tablet 0   lamoTRIgine (LAMICTAL) 200 MG tablet Take 1 tablet (200 mg total) by mouth daily. 90 tablet 0   QUEtiapine (SEROQUEL) 25 MG tablet Take 1 tablet (25 mg total) by mouth daily as needed for insomnia. 30 tablet 0   No current facility-administered medications for this visit.    Allergies as of 02/10/2024 - Review Complete 02/10/2024  Allergen Reaction Noted   Tramadol Other (See Comments)    Nsaids Other (See Comments) 09/19/2018  Family History  Problem Relation Age of Onset   Heart disease Other    Arthritis Other    Anesthesia problems Neg Hx    Hypotension Neg Hx    Malignant hyperthermia Neg Hx    Pseudochol deficiency Neg Hx     Social History   Socioeconomic History   Marital status: Single    Spouse name: Not on file   Number of children: Not on file   Years of education: college   Highest education level: Not on file  Occupational History   Occupation: none    Employer: SELF EMPLOYED  Tobacco Use   Smoking status: Former    Current packs/day: 0.00    Types: E-cigarettes, Cigarettes    Quit date: 2019    Years since quitting: 6.1   Smokeless tobacco: Never  Vaping Use   Vaping status: Every Day  Substance and Sexual Activity   Alcohol use: No   Drug use: Not Currently    Types: Marijuana   Sexual activity: Not Currently  Other Topics Concern   Not on file  Social History Narrative   Not on file   Social Drivers of Health    Financial Resource Strain: Low Risk  (08/27/2023)   Received from [facility]   Overall Financial Resource Strain (CARDIA)    Difficulty of Paying Living Expenses: Not hard at all  Food Insecurity: No Food Insecurity (08/27/2023)   Received from [facility]   Hunger Vital Sign    Worried About Running Out of Food in the Last Year: Never true    Ran Out of Food in the Last Year: Never true  Transportation Needs: No Transportation Needs (06/17/2023)   PRAPARE - Administrator, Civil Service (Medical): No    Lack of Transportation (Non-Medical): No  Physical Activity: Sufficiently Active (08/27/2023)   Received from [facility]   Exercise Vital Sign    Days of Exercise per Week: 7 days    Minutes of Exercise per Session: 30 min  Stress: Stress Concern Present (08/27/2023)   Received from [facility] - Occupational Stress Questionnaire    Feeling of Stress : To some extent  Social Connections: Moderately Isolated (08/27/2023)   Received from [facility]   Social Connection and Isolation Panel [NHANES]    Frequency of Communication with Friends and Family: More than three times a week    Frequency of Social Gatherings with Friends and Family: Once a week    Attends Religious Services: More than 4 times per year    Active Member of [employer] or Organizations: No    Attends Banker Meetings: Never    Marital Status: Never married    Review of Systems: Gen: Denies fever, chills, cold or flulike symptoms. CV: Denies chest pain. Resp: Denies dyspnea, cough. GI: See HPI Heme: See HPI denies bruising, bleeding, and enlarged lymph nodes.  Physical Exam: BP (!) 153/84 (BP Location: Left Arm, Patient Position: Sitting, Cuff Size: Normal)   Pulse 69   Temp 98.6 F (37 C) (Oral)   Ht 5\' 8"  (1.727 m)   Wt 132 lb 6.4 oz (60.1 kg)   SpO2 95%   BMI 20.13 kg/m  General:   Alert and oriented. No distress noted.  Pleasant and cooperative.  Head:  Normocephalic and atraumatic. Eyes:  Conjuctiva clear without scleral icterus. Heart:  S1, S2 present without murmurs appreciated. Lungs:  Clear to  auscultation bilaterally. No wheezes, rales, or rhonchi. No distress.  Abdomen:  +BS, soft, and non-distended. Mild RLQ TTP. No rebound or guarding. No HSM or masses noted. Msk:  Symmetrical without gross deformities. Normal posture. Extremities:  Without edema. Neurologic:  Alert and  oriented x4 Psych:  Normal mood and affect.    Assessment:  43 y.o. 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 Donell Jennings.  Postop course complicated by abdominal pain, weight loss, abdominal distention, requiring TPN for several months, but ultimately resumed regular diet and had G-tube removed in December 2024.    Patient is now presenting with recurrent epigastric abdominal pain, nausea postprandially.  Denies NSAIDs.  Off pantoprazole twice daily x 2 months, but reports symptoms were present prior to this though symptoms have been worsening over the last 2 to 3 weeks.  Differentials include gastritis, duodenitis, PUD/anastomotic ulcer versus symptoms related to undiagnosed diabetes.  Notably, patient is also reporting postprandial spikes in his blood sugar along with feeling sweaty and having palpitations.   At this point, I have recommended resuming PPI BID, EGD, and follow-up with PCP for evaluation of diabetes.     Patient also has mild constipation which may be contributing to mild RLQ TTP vs scar tissue/adhesions as RLQ tenderness has been present since his abdominal surgery.  Prior CT A/P with contrast October 2024 with no acute  abnormalities.  He did have moderate stool burden.     Plan:  Proceed with upper endoscopy with propofol by Dr. Levon Jennings in the near future. The risks, benefits, and alternatives have been discussed with the patient in detail. The patient states understanding and desires to proceed.  ASA 3 Start Nexium 40 mg twice daily Follow-up with PCP on elevated blood glucose.  Patient is to let us know if any new medications are started. Resume MiraLAX 17 g daily in 8 ounces of water. Follow-up after EGD.   Ermalinda Memos, PA-C [facility] Gastroenterology 02/10/2024   I have reviewed the note and agree with the APP's assessment as described in this progress note  Patient has presented some interesting symptoms after surgical intervention, possible dumping syndrome? Will evaluate endoscopically and possibly discuss dietary modifications to reduce symptoms.  Katrinka Blazing, MD Gastroenterology and Hepatology [facility] Gastroenterology

## 2024-02-10 ENCOUNTER — Encounter: Payer: Self-pay | Admitting: Gastroenterology

## 2024-02-10 ENCOUNTER — Ambulatory Visit (INDEPENDENT_AMBULATORY_CARE_PROVIDER_SITE_OTHER): Payer: Medicaid Other | Admitting: Gastroenterology

## 2024-02-10 VITALS — BP 153/84 | HR 69 | Temp 98.6°F | Ht 68.0 in | Wt 132.4 lb

## 2024-02-10 DIAGNOSIS — R739 Hyperglycemia, unspecified: Secondary | ICD-10-CM

## 2024-02-10 DIAGNOSIS — R110 Nausea: Secondary | ICD-10-CM

## 2024-02-10 DIAGNOSIS — K5900 Constipation, unspecified: Secondary | ICD-10-CM

## 2024-02-10 DIAGNOSIS — R1013 Epigastric pain: Secondary | ICD-10-CM | POA: Diagnosis not present

## 2024-02-10 NOTE — Patient Instructions (Signed)
 Start taking Nexium 40 mg twice daily 30 minutes before breakfast and dinner.  Will be scheduled for an upper endoscopy in the near future with Dr. Levon Hedger.  Resume taking MiraLAX 1 capful (17 g) daily in 8 ounces of water to help with bowel regularity.  Follow-up with your primary care doctor on your elevated blood sugars.  I suspect he may have diabetes.  If you are started on any new medications prior to your upper endoscopy, be sure to let us know.  We will see you back in the office after your upper endoscopy.  Ermalinda Memos, PA-C [facility] Gastroenterology

## 2024-02-11 ENCOUNTER — Encounter: Payer: Self-pay | Admitting: *Deleted

## 2024-02-11 ENCOUNTER — Telehealth: Payer: Self-pay | Admitting: *Deleted

## 2024-02-11 NOTE — Telephone Encounter (Signed)
 Availty PA:  Certification Number [id number]  Status PENDED  Reference Number [id number]  Review Reason 1 Disposition pending review

## 2024-02-12 NOTE — Patient Instructions (Signed)
 Alan Jennings  02/12/2024     @PREFPERIOPPHARMACY @   Your procedure is scheduled on  02/19/2024.   Report to [facility] at  0600 A.M.   Call this number if you have problems the morning of surgery:  [phone]  If you experience any cold or flu symptoms such as cough, fever, chills, shortness of breath, etc. between now and your scheduled surgery, please notify us at the above number.   Remember:  Follow the diet instructions given to you by the office.    You may drink clear liquids until 0330 am on 02/19/2024.     Clear liquids allowed are:                    Water, Juice (No red color; non-citric and without pulp; diabetics please choose diet or no sugar options), Carbonated beverages (diabetics please choose diet or no sugar options), Clear Tea (No creamer, milk, or cream, including half & half and powdered creamer), Black Coffee Only (No creamer, milk or cream, including half & half and powdered creamer), and Clear Sports drink (No red color; diabetics please choose diet or no sugar options)    Take these medicines the morning of surgery with A SIP OF WATER                              clonazepam, lamictal.    Do not wear jewelry, make-up or nail polish, including gel polish,  artificial nails, or any other type of covering on natural nails (fingers and  toes).  Do not wear lotions, powders, or perfumes, or deodorant.  Do not shave 48 hours prior to surgery.  Men may shave face and neck.  Do not bring valuables to the hospital.  [facility] is not responsible for any belongings or valuables.  Contacts, dentures or bridgework may not be worn into surgery.  Leave your suitcase in the car.  After surgery it may be brought to your room.  For patients admitted to the hospital, discharge time will be determined by your treatment team.  Patients discharged the day of surgery will not be allowed to drive home and must have someone with them for 24 hours.    Special  instructions:   DO NOT smoke tobacco or vape for 24 hours before your procedure.  Please read over the following fact sheets that you were given. Anesthesia Post-op Instructions and Care and Recovery After Surgery      Upper Endoscopy, Adult, Care After After the procedure, it is common to have a sore throat. It is also common to have: Mild stomach pain or discomfort. Bloating. Nausea. Follow these instructions at home: The instructions below may help you care for yourself at home. Your health care provider may give you more instructions. If you have questions, ask your health care provider. If you were given a sedative during the procedure, it can affect you for several hours. Do not drive or operate machinery until your health care provider says that it is safe. If you will be going home right after the procedure, plan to have a responsible adult: Take you home from the hospital or clinic. You will not be allowed to drive. Care for you for the time you are told. Follow instructions from your health care provider about what you may eat and drink. Return to your normal activities as told by your health care  provider. Ask your health care provider what activities are safe for you. Take over-the-counter and prescription medicines only as told by your health care provider. Contact a health care provider if you: Have a sore throat that lasts longer than one day. Have trouble swallowing. Have a fever. Get help right away if you: Vomit blood or your vomit looks like coffee grounds. Have bloody, black, or tarry stools. Have a very bad sore throat or you cannot swallow. Have difficulty breathing or very bad pain in your chest or abdomen. These symptoms may be an emergency. Get help right away. Call 911. Do not wait to see if the symptoms will go away. Do not drive yourself to the hospital. Summary After the procedure, it is common to have a sore throat, mild stomach discomfort, bloating,  and nausea. If you were given a sedative during the procedure, it can affect you for several hours. Do not drive until your health care provider says that it is safe. Follow instructions from your health care provider about what you may eat and drink. Return to your normal activities as told by your health care provider. This information is not intended to replace advice given to you by your health care provider. Make sure you discuss any questions you have with your health care provider. Document Revised: 03/12/2022 Document Reviewed: 03/12/2022 Elsevier Patient Education  2024 Elsevier Inc.Monitored Anesthesia Care, Care After The following information offers guidance on how to care for yourself after your procedure. Your health care provider may also give you more specific instructions. If you have problems or questions, contact your health care provider. What can I expect after the procedure? After the procedure, it is common to have: Tiredness. Little or no memory about what happened during or after the procedure. Impaired judgment when it comes to making decisions. Nausea or vomiting. Some trouble with balance. Follow these instructions at home: For the time period you were told by your health care provider:  Rest. Do not participate in activities where you could fall or become injured. Do not drive or use machinery. Do not drink alcohol. Do not take sleeping pills or medicines that cause drowsiness. Do not make important decisions or sign legal documents. Do not take care of children on your own. Medicines Take over-the-counter and prescription medicines only as told by your health care provider. If you were prescribed antibiotics, take them as told by your health care provider. Do not stop using the antibiotic even if you start to feel better. Eating and drinking Follow instructions from your health care provider about what you may eat and drink. Drink enough fluid to keep your  urine pale yellow. If you vomit: Drink clear fluids slowly and in small amounts as you are able. Clear fluids include water, ice chips, low-calorie sports drinks, and fruit juice that has water added to it (diluted fruit juice). Eat light and bland foods in small amounts as you are able. These foods include bananas, applesauce, rice, lean meats, toast, and crackers. General instructions  Have a responsible adult stay with you for the time you are told. It is important to have someone help care for you until you are awake and alert. If you have sleep apnea, surgery and some medicines can increase your risk for breathing problems. Follow instructions from your health care provider about wearing your sleep device: When you are sleeping. This includes during daytime naps. While taking prescription pain medicines, sleeping medicines, or medicines that make you drowsy. Do not  use any products that contain nicotine or tobacco. These products include cigarettes, chewing tobacco, and vaping devices, such as e-cigarettes. If you need help quitting, ask your health care provider. Contact a health care provider if: You feel nauseous or vomit every time you eat or drink. You feel light-headed. You are still sleepy or having trouble with balance after 24 hours. You get a rash. You have a fever. You have redness or swelling around the IV site. Get help right away if: You have trouble breathing. You have new confusion after you get home. These symptoms may be an emergency. Get help right away. Call 911. Do not wait to see if the symptoms will go away. Do not drive yourself to the hospital. This information is not intended to replace advice given to you by your health care provider. Make sure you discuss any questions you have with your health care provider. Document Revised: 04/28/2022 Document Reviewed: 04/28/2022 Elsevier Patient Education  2024 [employer].

## 2024-02-15 ENCOUNTER — Other Ambulatory Visit (HOSPITAL_COMMUNITY): Payer: Self-pay

## 2024-02-15 NOTE — Telephone Encounter (Signed)
 Fax from [facility] stating no auth required for EGD

## 2024-02-16 ENCOUNTER — Encounter (HOSPITAL_COMMUNITY)
Admission: RE | Admit: 2024-02-16 | Discharge: 2024-02-16 | Disposition: A | Discharge status: Home / Self Care | Payer: Medicaid Other | Source: Ambulatory Visit | Attending: Gastroenterology | Admitting: Gastroenterology

## 2024-02-16 ENCOUNTER — Encounter (HOSPITAL_COMMUNITY): Payer: Self-pay

## 2024-02-16 ENCOUNTER — Telehealth: Payer: Self-pay | Admitting: *Deleted

## 2024-02-16 DIAGNOSIS — D649 Anemia, unspecified: Secondary | ICD-10-CM

## 2024-02-16 NOTE — Telephone Encounter (Signed)
-----   Message from Nurse Marinell Blight sent at 02/16/2024  2:03 PM EST ----- Regarding: no show Good afternoon! Alan Jennings did not show for his preop this afternoon.

## 2024-02-16 NOTE — Telephone Encounter (Signed)
Called pt, LMOVM to call back. Also sent mychart message.

## 2024-02-16 NOTE — Pre-Procedure Instructions (Signed)
 Office messaged because he did not show for pre-op today.

## 2024-02-16 NOTE — Telephone Encounter (Signed)
 Availity PA: Certification Number [id number]  Status CANCELLED  Reference Number [id number]  Review Reason 1 Certification Not Required for this Service

## 2024-02-17 ENCOUNTER — Other Ambulatory Visit (HOSPITAL_COMMUNITY): Payer: Self-pay

## 2024-02-17 MED ORDER — CLONAZEPAM 1 MG PO TABS
1.0000 mg | ORAL_TABLET | Freq: Every day | ORAL | 0 refills | Status: DC
  Filled 2024-02-17: qty 20, 20d supply, fill #0

## 2024-02-17 MED ORDER — LAMOTRIGINE 200 MG PO TABS
200.0000 mg | ORAL_TABLET | Freq: Every day | ORAL | 0 refills | Status: AC
  Filled 2024-02-17: qty 90, 90d supply, fill #0

## 2024-02-18 ENCOUNTER — Encounter (HOSPITAL_COMMUNITY): Payer: Self-pay | Admitting: Anesthesiology

## 2024-02-18 ENCOUNTER — Other Ambulatory Visit (HOSPITAL_COMMUNITY): Payer: Self-pay

## 2024-02-19 ENCOUNTER — Ambulatory Visit (HOSPITAL_COMMUNITY): Admission: RE | Admit: 2024-02-19 | Payer: Medicaid Other | Source: Home / Self Care | Admitting: Gastroenterology

## 2024-02-19 ENCOUNTER — Encounter (HOSPITAL_COMMUNITY): Admission: RE | Payer: Self-pay | Source: Home / Self Care

## 2024-02-19 SURGERY — ESOPHAGOGASTRODUODENOSCOPY (EGD) WITH PROPOFOL
Anesthesia: Choice

## 2024-02-19 NOTE — OR Nursing (Signed)
 Tried to call patient this morning because he was a no show. No answer.

## 2024-02-25 ENCOUNTER — Other Ambulatory Visit (HOSPITAL_COMMUNITY): Payer: Self-pay

## 2024-03-15 ENCOUNTER — Other Ambulatory Visit (HOSPITAL_COMMUNITY): Payer: Self-pay

## 2024-03-15 MED ORDER — BUPRENORPHINE HCL 8 MG SL SUBL
8.0000 mg | SUBLINGUAL_TABLET | Freq: Three times a day (TID) | SUBLINGUAL | 0 refills | Status: AC
Start: 2024-03-15 — End: ?
  Filled 2024-03-15: qty 90, 30d supply, fill #0

## 2024-03-15 MED ORDER — CLONAZEPAM 1 MG PO TABS
1.0000 mg | ORAL_TABLET | Freq: Every day | ORAL | 0 refills | Status: AC
  Filled 2024-03-15: qty 20, 20d supply, fill #0

## 2024-04-12 ENCOUNTER — Other Ambulatory Visit (HOSPITAL_COMMUNITY): Payer: Self-pay

## 2024-04-12 MED ORDER — BUPRENORPHINE HCL-NALOXONE HCL 8-2 MG SL FILM
1.0000 | ORAL_FILM | Freq: Three times a day (TID) | SUBLINGUAL | 0 refills | Status: DC
Start: 2024-04-12 — End: 2024-05-10
  Filled 2024-04-12: qty 90, 30d supply, fill #0

## 2024-05-10 ENCOUNTER — Other Ambulatory Visit (HOSPITAL_COMMUNITY): Payer: Self-pay

## 2024-05-10 MED ORDER — PRAZOSIN HCL 2 MG PO CAPS
2.0000 mg | ORAL_CAPSULE | Freq: Every day | ORAL | 0 refills | Status: AC
  Filled 2024-05-10: qty 30, 30d supply, fill #0

## 2024-05-10 MED ORDER — BUPRENORPHINE HCL-NALOXONE HCL 8-2 MG SL FILM
1.0000 | ORAL_FILM | Freq: Three times a day (TID) | SUBLINGUAL | 0 refills | Status: DC
  Filled 2024-05-10: qty 90, 30d supply, fill #0

## 2024-06-06 ENCOUNTER — Other Ambulatory Visit (HOSPITAL_COMMUNITY): Payer: Self-pay

## 2024-06-06 MED ORDER — QUETIAPINE FUMARATE 25 MG PO TABS
25.0000 mg | ORAL_TABLET | Freq: Every day | ORAL | 0 refills | Status: AC | PRN
  Filled 2024-06-06: qty 30, 30d supply, fill #0

## 2024-06-06 MED ORDER — LAMOTRIGINE 200 MG PO TABS
200.0000 mg | ORAL_TABLET | Freq: Every day | ORAL | 0 refills | Status: AC
  Filled 2024-06-06: qty 90, 90d supply, fill #0

## 2024-06-07 ENCOUNTER — Other Ambulatory Visit (HOSPITAL_COMMUNITY): Payer: Self-pay

## 2024-06-07 MED ORDER — BUPRENORPHINE HCL-NALOXONE HCL 8-2 MG SL FILM
1.0000 | ORAL_FILM | Freq: Three times a day (TID) | SUBLINGUAL | 0 refills | Status: DC
  Filled 2024-06-07: qty 90, 30d supply, fill #0

## 2024-06-29 ENCOUNTER — Other Ambulatory Visit (HOSPITAL_BASED_OUTPATIENT_CLINIC_OR_DEPARTMENT_OTHER): Payer: Self-pay

## 2024-07-05 ENCOUNTER — Other Ambulatory Visit (HOSPITAL_COMMUNITY): Payer: Self-pay

## 2024-07-05 MED ORDER — BUPRENORPHINE HCL-NALOXONE HCL 8-2 MG SL SUBL
1.0000 | SUBLINGUAL_TABLET | Freq: Three times a day (TID) | SUBLINGUAL | 0 refills | Status: DC
  Filled 2024-07-05: qty 84, 28d supply, fill #0
  Filled 2024-07-05: qty 6, 2d supply, fill #0
  Filled 2024-07-05: qty 90, 30d supply, fill #0
  Filled 2024-07-05: qty 81, 27d supply, fill #0
  Filled 2024-07-05: qty 9, 3d supply, fill #0

## 2024-07-05 MED ORDER — BUPRENORPHINE HCL-NALOXONE HCL 8-2 MG SL FILM
1.0000 | ORAL_FILM | Freq: Three times a day (TID) | SUBLINGUAL | 0 refills | Status: DC
  Filled 2024-07-05: qty 90, 30d supply, fill #0

## 2024-07-14 ENCOUNTER — Other Ambulatory Visit: Payer: Self-pay

## 2024-08-01 ENCOUNTER — Other Ambulatory Visit (HOSPITAL_COMMUNITY): Payer: Self-pay

## 2024-08-01 MED ORDER — QUETIAPINE FUMARATE 25 MG PO TABS
25.0000 mg | ORAL_TABLET | Freq: Every day | ORAL | 0 refills | Status: AC | PRN
  Filled 2024-08-01: qty 30, 30d supply, fill #0

## 2024-08-02 ENCOUNTER — Other Ambulatory Visit (HOSPITAL_COMMUNITY): Payer: Self-pay

## 2024-08-02 MED ORDER — BUPRENORPHINE HCL-NALOXONE HCL 8-2 MG SL SUBL
1.0000 | SUBLINGUAL_TABLET | Freq: Three times a day (TID) | SUBLINGUAL | 0 refills | Status: DC
  Filled 2024-08-02 (×2): qty 90, 30d supply, fill #0

## 2024-08-30 ENCOUNTER — Other Ambulatory Visit (HOSPITAL_COMMUNITY): Payer: Self-pay

## 2024-08-30 ENCOUNTER — Other Ambulatory Visit: Payer: Self-pay

## 2024-08-30 MED ORDER — BUPRENORPHINE HCL-NALOXONE HCL 8-2 MG SL SUBL
1.0000 | SUBLINGUAL_TABLET | Freq: Three times a day (TID) | SUBLINGUAL | 0 refills | Status: AC
  Filled 2024-08-30: qty 90, 30d supply, fill #0

## 2024-08-30 MED ORDER — LAMOTRIGINE 200 MG PO TABS
200.0000 mg | ORAL_TABLET | Freq: Every day | ORAL | 0 refills | Status: AC
  Filled 2024-08-30: qty 90, 90d supply, fill #0

## 2024-09-10 ENCOUNTER — Other Ambulatory Visit (HOSPITAL_COMMUNITY): Payer: Self-pay

## 2024-09-28 ENCOUNTER — Encounter (INDEPENDENT_AMBULATORY_CARE_PROVIDER_SITE_OTHER): Payer: Self-pay | Admitting: Gastroenterology

## 2024-09-28 ENCOUNTER — Other Ambulatory Visit: Payer: Self-pay

## 2024-09-28 ENCOUNTER — Other Ambulatory Visit (HOSPITAL_COMMUNITY): Payer: Self-pay

## 2024-09-28 MED ORDER — CLONAZEPAM 1 MG PO TABS
1.0000 mg | ORAL_TABLET | Freq: Every day | ORAL | 0 refills | Status: DC
  Filled 2024-09-28: qty 25, 25d supply, fill #0

## 2024-09-28 MED ORDER — PRAZOSIN HCL 2 MG PO CAPS
2.0000 mg | ORAL_CAPSULE | Freq: Every day | ORAL | 0 refills | Status: AC
  Filled 2024-09-28: qty 30, 30d supply, fill #0

## 2024-09-28 MED ORDER — BUPRENORPHINE HCL-NALOXONE HCL 8-2 MG SL FILM
8.0000 mg | ORAL_FILM | Freq: Three times a day (TID) | SUBLINGUAL | 0 refills | Status: DC
  Filled 2024-09-28: qty 90, 30d supply, fill #0

## 2024-09-28 MED ORDER — LAMOTRIGINE 200 MG PO TABS
200.0000 mg | ORAL_TABLET | Freq: Every day | ORAL | 0 refills | Status: AC
  Filled 2024-09-28: qty 90, 90d supply, fill #0

## 2024-10-09 ENCOUNTER — Other Ambulatory Visit (HOSPITAL_COMMUNITY): Payer: Self-pay

## 2024-10-26 ENCOUNTER — Other Ambulatory Visit (HOSPITAL_BASED_OUTPATIENT_CLINIC_OR_DEPARTMENT_OTHER): Payer: Self-pay

## 2024-10-26 ENCOUNTER — Other Ambulatory Visit (HOSPITAL_COMMUNITY): Payer: Self-pay

## 2024-10-26 MED ORDER — CLONAZEPAM 1 MG PO TABS
1.0000 mg | ORAL_TABLET | Freq: Every day | ORAL | 0 refills | Status: DC
  Filled 2024-10-26: qty 25, 30d supply, fill #0

## 2024-10-26 MED ORDER — PRAZOSIN HCL 2 MG PO CAPS
2.0000 mg | ORAL_CAPSULE | Freq: Every day | ORAL | 2 refills | Status: AC
  Filled 2024-10-26 – 2024-11-24 (×2): qty 30, 30d supply, fill #0
  Filled 2025-01-18: qty 30, 30d supply, fill #1

## 2024-10-26 MED ORDER — LAMOTRIGINE 200 MG PO TABS
200.0000 mg | ORAL_TABLET | Freq: Every day | ORAL | 2 refills | Status: AC
  Filled 2024-10-26: qty 90, 90d supply, fill #0

## 2024-10-26 MED ORDER — BUPRENORPHINE HCL-NALOXONE HCL 8-2 MG SL FILM
1.0000 | ORAL_FILM | Freq: Three times a day (TID) | SUBLINGUAL | 0 refills | Status: DC
  Filled 2024-10-26: qty 90, 30d supply, fill #0

## 2024-11-23 ENCOUNTER — Other Ambulatory Visit (HOSPITAL_COMMUNITY): Payer: Self-pay

## 2024-11-23 MED ORDER — BUPRENORPHINE HCL-NALOXONE HCL 8-2 MG SL FILM
1.0000 | ORAL_FILM | Freq: Three times a day (TID) | SUBLINGUAL | 0 refills | Status: DC
  Filled 2024-11-23: qty 90, 30d supply, fill #0

## 2024-11-23 MED ORDER — CLONAZEPAM 1 MG PO TABS
1.0000 mg | ORAL_TABLET | Freq: Every day | ORAL | 0 refills | Status: DC
  Filled 2024-11-23: qty 25, 30d supply, fill #0

## 2024-11-24 ENCOUNTER — Other Ambulatory Visit (HOSPITAL_BASED_OUTPATIENT_CLINIC_OR_DEPARTMENT_OTHER): Payer: Self-pay

## 2024-12-21 ENCOUNTER — Other Ambulatory Visit (HOSPITAL_COMMUNITY): Payer: Self-pay

## 2024-12-21 MED ORDER — BUPRENORPHINE HCL-NALOXONE HCL 8-2 MG SL FILM
1.0000 | ORAL_FILM | Freq: Three times a day (TID) | SUBLINGUAL | 0 refills | Status: DC
  Filled 2024-12-21: qty 90, 30d supply, fill #0

## 2024-12-21 MED ORDER — LAMOTRIGINE 200 MG PO TABS
200.0000 mg | ORAL_TABLET | Freq: Every day | ORAL | 0 refills | Status: AC
  Filled 2024-12-21: qty 90, 90d supply, fill #0

## 2024-12-21 MED ORDER — PRAZOSIN HCL 2 MG PO CAPS
2.0000 mg | ORAL_CAPSULE | Freq: Every day | ORAL | 0 refills | Status: AC
  Filled 2024-12-21: qty 30, 30d supply, fill #0

## 2024-12-21 MED ORDER — CLONAZEPAM 1 MG PO TABS
1.0000 mg | ORAL_TABLET | Freq: Every day | ORAL | 0 refills | Status: AC
  Filled 2024-12-21: qty 25, 25d supply, fill #0

## 2025-01-18 ENCOUNTER — Other Ambulatory Visit (HOSPITAL_BASED_OUTPATIENT_CLINIC_OR_DEPARTMENT_OTHER): Payer: Self-pay

## 2025-01-18 ENCOUNTER — Encounter (HOSPITAL_BASED_OUTPATIENT_CLINIC_OR_DEPARTMENT_OTHER): Payer: Self-pay

## 2025-01-18 ENCOUNTER — Other Ambulatory Visit (HOSPITAL_COMMUNITY): Payer: Self-pay

## 2025-01-18 MED ORDER — BUPRENORPHINE HCL-NALOXONE HCL 8-2 MG SL FILM
1.0000 | ORAL_FILM | Freq: Three times a day (TID) | SUBLINGUAL | 0 refills | Status: AC
  Filled 2025-01-18: qty 90, 30d supply, fill #0

## 2025-01-18 MED ORDER — CLONAZEPAM 1 MG PO TABS
1.0000 mg | ORAL_TABLET | Freq: Every day | ORAL | 0 refills | Status: AC | PRN
  Filled 2025-01-18: qty 25, 30d supply, fill #0

## 2025-01-18 MED ORDER — LAMOTRIGINE 200 MG PO TABS: 200.0000 mg | ORAL_TABLET | Freq: Every day | ORAL | 1 refills | Status: AC

## 2025-01-18 MED ORDER — PRAZOSIN HCL 2 MG PO CAPS
2.0000 mg | ORAL_CAPSULE | Freq: Every day | ORAL | 2 refills | Status: AC
  Filled 2025-01-18: qty 30, 30d supply, fill #0
